# Patient Record
Sex: Female | Born: 1965 | Hispanic: No | Smoking: Never smoker
Health system: Southern US, Community
[De-identification: ages and names within clinical notes are randomized; demographics above are authoritative.]

## PROBLEM LIST (undated history)

## (undated) DIAGNOSIS — T7840XA Allergy, unspecified, initial encounter: Secondary | ICD-10-CM

## (undated) DIAGNOSIS — K222 Esophageal obstruction: Secondary | ICD-10-CM

## (undated) DIAGNOSIS — D649 Anemia, unspecified: Secondary | ICD-10-CM

## (undated) DIAGNOSIS — F329 Major depressive disorder, single episode, unspecified: Secondary | ICD-10-CM

## (undated) DIAGNOSIS — Z5189 Encounter for other specified aftercare: Secondary | ICD-10-CM

## (undated) DIAGNOSIS — F419 Anxiety disorder, unspecified: Secondary | ICD-10-CM

## (undated) DIAGNOSIS — K219 Gastro-esophageal reflux disease without esophagitis: Secondary | ICD-10-CM

## (undated) DIAGNOSIS — G43909 Migraine, unspecified, not intractable, without status migrainosus: Secondary | ICD-10-CM

## (undated) DIAGNOSIS — L309 Dermatitis, unspecified: Secondary | ICD-10-CM

## (undated) DIAGNOSIS — F32A Depression, unspecified: Secondary | ICD-10-CM

## (undated) DIAGNOSIS — J45909 Unspecified asthma, uncomplicated: Secondary | ICD-10-CM

## (undated) DIAGNOSIS — M722 Plantar fascial fibromatosis: Secondary | ICD-10-CM

## (undated) DIAGNOSIS — R42 Dizziness and giddiness: Secondary | ICD-10-CM

## (undated) DIAGNOSIS — G2581 Restless legs syndrome: Secondary | ICD-10-CM

## (undated) DIAGNOSIS — F429 Obsessive-compulsive disorder, unspecified: Secondary | ICD-10-CM

## (undated) DIAGNOSIS — M1990 Unspecified osteoarthritis, unspecified site: Secondary | ICD-10-CM

## (undated) HISTORY — DX: Allergy, unspecified, initial encounter: T78.40XA

## (undated) HISTORY — PX: NASAL SINUS SURGERY: SHX719

## (undated) HISTORY — DX: Migraine, unspecified, not intractable, without status migrainosus: G43.909

## (undated) HISTORY — DX: Encounter for other specified aftercare: Z51.89

## (undated) HISTORY — DX: Dizziness and giddiness: R42

## (undated) HISTORY — PX: TONSILLECTOMY: SUR1361

## (undated) HISTORY — DX: Dermatitis, unspecified: L30.9

## (undated) HISTORY — DX: Unspecified asthma, uncomplicated: J45.909

## (undated) HISTORY — DX: Anemia, unspecified: D64.9

## (undated) HISTORY — DX: Esophageal obstruction: K22.2

## (undated) HISTORY — PX: FOOT SURGERY: SHX648

## (undated) HISTORY — DX: Restless legs syndrome: G25.81

## (undated) HISTORY — DX: Gastro-esophageal reflux disease without esophagitis: K21.9

## (undated) HISTORY — DX: Unspecified osteoarthritis, unspecified site: M19.90

## (undated) HISTORY — DX: Anxiety disorder, unspecified: F41.9

---

## 1998-09-12 ENCOUNTER — Other Ambulatory Visit: Admission: RE | Admit: 1998-09-12 | Discharge: 1998-09-12 | Payer: Self-pay | Admitting: Obstetrics & Gynecology

## 2002-12-16 ENCOUNTER — Other Ambulatory Visit: Admission: RE | Admit: 2002-12-16 | Discharge: 2002-12-16 | Payer: Self-pay | Admitting: Obstetrics and Gynecology

## 2003-04-07 ENCOUNTER — Encounter: Payer: Self-pay | Admitting: Obstetrics and Gynecology

## 2003-04-07 ENCOUNTER — Ambulatory Visit (HOSPITAL_COMMUNITY): Admission: RE | Admit: 2003-04-07 | Discharge: 2003-04-07 | Payer: Self-pay | Admitting: Obstetrics and Gynecology

## 2003-05-31 ENCOUNTER — Inpatient Hospital Stay (HOSPITAL_COMMUNITY): Admission: AD | Admit: 2003-05-31 | Discharge: 2003-05-31 | Payer: Self-pay | Admitting: Obstetrics and Gynecology

## 2003-09-06 ENCOUNTER — Inpatient Hospital Stay (HOSPITAL_COMMUNITY): Admission: AD | Admit: 2003-09-06 | Discharge: 2003-09-11 | Payer: Self-pay | Admitting: Obstetrics and Gynecology

## 2003-09-07 ENCOUNTER — Encounter (INDEPENDENT_AMBULATORY_CARE_PROVIDER_SITE_OTHER): Payer: Self-pay

## 2003-09-08 ENCOUNTER — Encounter (INDEPENDENT_AMBULATORY_CARE_PROVIDER_SITE_OTHER): Payer: Self-pay | Admitting: Cardiology

## 2003-12-20 ENCOUNTER — Other Ambulatory Visit: Admission: RE | Admit: 2003-12-20 | Discharge: 2003-12-20 | Payer: Self-pay | Admitting: Obstetrics and Gynecology

## 2004-03-09 ENCOUNTER — Ambulatory Visit (HOSPITAL_COMMUNITY): Admission: RE | Admit: 2004-03-09 | Discharge: 2004-03-09 | Payer: Self-pay | Admitting: Family Medicine

## 2004-03-16 ENCOUNTER — Ambulatory Visit (HOSPITAL_COMMUNITY): Admission: RE | Admit: 2004-03-16 | Discharge: 2004-03-16 | Payer: Self-pay | Admitting: Family Medicine

## 2004-04-17 ENCOUNTER — Encounter: Admission: RE | Admit: 2004-04-17 | Discharge: 2004-07-16 | Payer: Self-pay | Admitting: Family Medicine

## 2004-10-03 ENCOUNTER — Ambulatory Visit: Payer: Self-pay | Admitting: Nurse Practitioner

## 2004-12-14 ENCOUNTER — Ambulatory Visit: Payer: Self-pay | Admitting: Nurse Practitioner

## 2004-12-29 ENCOUNTER — Ambulatory Visit: Payer: Self-pay | Admitting: Family Medicine

## 2005-03-08 ENCOUNTER — Other Ambulatory Visit: Admission: RE | Admit: 2005-03-08 | Discharge: 2005-03-08 | Payer: Self-pay | Admitting: Obstetrics and Gynecology

## 2005-05-24 ENCOUNTER — Ambulatory Visit: Payer: Self-pay | Admitting: Nurse Practitioner

## 2005-05-30 ENCOUNTER — Emergency Department (HOSPITAL_COMMUNITY): Admission: EM | Admit: 2005-05-30 | Discharge: 2005-05-30 | Payer: Self-pay | Admitting: Family Medicine

## 2005-08-06 ENCOUNTER — Ambulatory Visit: Payer: Self-pay | Admitting: Family Medicine

## 2005-10-04 ENCOUNTER — Ambulatory Visit: Payer: Self-pay | Admitting: Family Medicine

## 2006-02-18 ENCOUNTER — Ambulatory Visit: Payer: Self-pay | Admitting: Family Medicine

## 2006-04-30 ENCOUNTER — Other Ambulatory Visit: Admission: RE | Admit: 2006-04-30 | Discharge: 2006-04-30 | Payer: Self-pay | Admitting: Obstetrics and Gynecology

## 2006-06-29 ENCOUNTER — Emergency Department (HOSPITAL_COMMUNITY): Admission: EM | Admit: 2006-06-29 | Discharge: 2006-06-29 | Payer: Self-pay | Admitting: Family Medicine

## 2006-07-11 ENCOUNTER — Ambulatory Visit (HOSPITAL_COMMUNITY): Admission: RE | Admit: 2006-07-11 | Discharge: 2006-07-11 | Payer: Self-pay | Admitting: Family Medicine

## 2006-08-08 ENCOUNTER — Ambulatory Visit (HOSPITAL_BASED_OUTPATIENT_CLINIC_OR_DEPARTMENT_OTHER): Admission: RE | Admit: 2006-08-08 | Discharge: 2006-08-08 | Payer: Self-pay | Admitting: Otolaryngology

## 2006-08-08 ENCOUNTER — Encounter (INDEPENDENT_AMBULATORY_CARE_PROVIDER_SITE_OTHER): Payer: Self-pay | Admitting: *Deleted

## 2006-10-14 ENCOUNTER — Ambulatory Visit: Payer: Self-pay | Admitting: Family Medicine

## 2007-06-02 ENCOUNTER — Encounter: Admission: RE | Admit: 2007-06-02 | Discharge: 2007-06-02 | Payer: Self-pay | Admitting: Obstetrics and Gynecology

## 2007-06-04 ENCOUNTER — Ambulatory Visit: Payer: Self-pay | Admitting: Family Medicine

## 2007-09-15 ENCOUNTER — Ambulatory Visit: Payer: Self-pay | Admitting: Family Medicine

## 2007-09-20 ENCOUNTER — Emergency Department (HOSPITAL_COMMUNITY): Admission: EM | Admit: 2007-09-20 | Discharge: 2007-09-20 | Payer: Self-pay | Admitting: Emergency Medicine

## 2007-11-12 ENCOUNTER — Emergency Department (HOSPITAL_COMMUNITY): Admission: EM | Admit: 2007-11-12 | Discharge: 2007-11-12 | Payer: Self-pay | Admitting: Family Medicine

## 2008-02-14 ENCOUNTER — Emergency Department (HOSPITAL_COMMUNITY): Admission: EM | Admit: 2008-02-14 | Discharge: 2008-02-14 | Payer: Self-pay | Admitting: Family Medicine

## 2008-02-23 ENCOUNTER — Ambulatory Visit: Payer: Self-pay | Admitting: Internal Medicine

## 2008-04-15 ENCOUNTER — Ambulatory Visit: Payer: Self-pay | Admitting: Internal Medicine

## 2008-04-17 ENCOUNTER — Ambulatory Visit (HOSPITAL_COMMUNITY): Admission: RE | Admit: 2008-04-17 | Discharge: 2008-04-17 | Payer: Self-pay | Admitting: Family Medicine

## 2009-04-04 ENCOUNTER — Encounter (INDEPENDENT_AMBULATORY_CARE_PROVIDER_SITE_OTHER): Payer: Self-pay | Admitting: Family Medicine

## 2009-04-04 ENCOUNTER — Ambulatory Visit: Payer: Self-pay | Admitting: Family Medicine

## 2009-04-04 LAB — CONVERTED CEMR LAB: Helicobacter Pylori Antibody-IgG: 0.9

## 2009-10-03 ENCOUNTER — Ambulatory Visit: Payer: Self-pay | Admitting: Family Medicine

## 2010-07-09 ENCOUNTER — Emergency Department (HOSPITAL_COMMUNITY): Admission: EM | Admit: 2010-07-09 | Discharge: 2010-07-09 | Payer: Self-pay | Admitting: Family Medicine

## 2010-07-09 ENCOUNTER — Emergency Department (HOSPITAL_COMMUNITY): Admission: EM | Admit: 2010-07-09 | Discharge: 2010-07-09 | Payer: Self-pay | Admitting: Emergency Medicine

## 2010-07-21 ENCOUNTER — Ambulatory Visit: Payer: Self-pay | Admitting: Family Medicine

## 2010-07-21 LAB — CONVERTED CEMR LAB
CO2: 26 meq/L (ref 19–32)
Calcium: 9.1 mg/dL (ref 8.4–10.5)
Potassium: 4.9 meq/L (ref 3.5–5.3)
Sodium: 139 meq/L (ref 135–145)
TSH: 1.538 microintl units/mL (ref 0.350–4.500)
Vit D, 25-Hydroxy: 38 ng/mL (ref 30–89)

## 2010-08-04 ENCOUNTER — Ambulatory Visit (HOSPITAL_BASED_OUTPATIENT_CLINIC_OR_DEPARTMENT_OTHER): Admission: RE | Admit: 2010-08-04 | Discharge: 2010-08-04 | Payer: Self-pay | Admitting: Family Medicine

## 2010-08-05 ENCOUNTER — Ambulatory Visit: Payer: Self-pay | Admitting: Internal Medicine

## 2010-12-31 ENCOUNTER — Encounter: Payer: Self-pay | Admitting: Family Medicine

## 2011-02-21 ENCOUNTER — Encounter (INDEPENDENT_AMBULATORY_CARE_PROVIDER_SITE_OTHER): Payer: Self-pay | Admitting: Family Medicine

## 2011-02-21 LAB — CONVERTED CEMR LAB
ALT: 17 units/L (ref 0–35)
Basophils Absolute: 0 10*3/uL (ref 0.0–0.1)
CO2: 21 meq/L (ref 19–32)
Calcium: 9.6 mg/dL (ref 8.4–10.5)
Chloride: 107 meq/L (ref 96–112)
Creatinine, Ser: 0.85 mg/dL (ref 0.40–1.20)
FSH: 5.8 milliintl units/mL
Hemoglobin: 13.2 g/dL (ref 12.0–15.0)
Lymphocytes Relative: 38 % (ref 12–46)
Monocytes Absolute: 0.6 10*3/uL (ref 0.1–1.0)
Neutro Abs: 3.9 10*3/uL (ref 1.7–7.7)
RBC: 4.54 M/uL (ref 3.87–5.11)
RDW: 14.5 % (ref 11.5–15.5)
Total Protein: 6.8 g/dL (ref 6.0–8.3)
WBC: 8 10*3/uL (ref 4.0–10.5)

## 2011-02-24 LAB — POCT I-STAT, CHEM 8
Calcium, Ion: 1.02 mmol/L — ABNORMAL LOW (ref 1.12–1.32)
Chloride: 108 mEq/L (ref 96–112)
HCT: 40 % (ref 36.0–46.0)
Potassium: 3.8 mEq/L (ref 3.5–5.1)
Sodium: 138 mEq/L (ref 135–145)

## 2011-02-24 LAB — URINALYSIS, ROUTINE W REFLEX MICROSCOPIC
Ketones, ur: NEGATIVE mg/dL
Nitrite: NEGATIVE
Protein, ur: NEGATIVE mg/dL
pH: 5.5 (ref 5.0–8.0)

## 2011-02-24 LAB — CBC
MCHC: 34.3 g/dL (ref 30.0–36.0)
Platelets: 258 10*3/uL (ref 150–400)
RDW: 14.3 % (ref 11.5–15.5)
WBC: 10.3 10*3/uL (ref 4.0–10.5)

## 2011-02-24 LAB — POCT PREGNANCY, URINE: Preg Test, Ur: NEGATIVE

## 2011-02-24 LAB — DIFFERENTIAL
Basophils Absolute: 0 10*3/uL (ref 0.0–0.1)
Basophils Relative: 1 % (ref 0–1)
Neutro Abs: 6.3 10*3/uL (ref 1.7–7.7)
Neutrophils Relative %: 61 % (ref 43–77)

## 2011-04-27 NOTE — Discharge Summary (Signed)
NAME:  Stephanie Serrano, Stephanie Serrano              ACCOUNT NO.:  [id number]   MEDICAL RECORD NO.:  [id number]                   PATIENT TYPE:  INP   LOCATION:  9326                                 FACILITY:  [facility]   PHYSICIAN:  Naima A. Dillard, M.D.              DATE OF BIRTH:  1966-03-31   DATE OF ADMISSION:  09/06/2003  DATE OF DISCHARGE:  09/11/2003                                 DISCHARGE SUMMARY   ADMISSION DIAGNOSES:  1. Intrauterine pregnancy at term.  2. Status post fall.  3. Induction of labor.   DISCHARGE DIAGNOSES:  1. Intrauterine pregnancy at term.  2. Status post fall.  3. Induction of labor.  4. Inverted uterus.  5. Postpartum hemorrhage.  6. Macrosomia with shoulder dystocia.   PROCEDURE:  1. Spontaneous vaginal birth.  2. Repair of second degree perineal laceration.  3. Replacement of inverted uterus.   HOSPITAL COURSE:  Stephanie Serrano is a 45 year old gravida 4, para 1-0-2-  1 at 41-0/7 weeks who presented status post fall on September 06, 2003. She  had landed on her left hip and her right knee. She denied leaking or  bleeding and reported uterine contractions began after the fall. Her  pregnancy had been followed by the [facility] and  Gynecologic Certified Nurse Midwife Services and had been remarkable for 1.  AMA. 2. Inguinal hernia with her last pregnancy and no repair. 3. History of  asthma. 4. Social stress. 5. Group B strep negative. Upon admission, the  patient's cervix was 1 to 2 cm dilated, 90%, vertex, and minus 1. She was  having uterine contractions every 1 to 4 minutes. Her membranes were  ruptured that evening at 8:30 for thin meconium stained fluid. At that  point, she was 2 cm and 90% effaced. An hour later, she requested an  epidural. She continued to progress in a normal pattern throughout her  labor. Fetal heart rate at times had decreased variability with occasional  early variables. By 5:40 a.m. on September 07, 2003, the patient was  completely dilated with vertex at the +1 station and she began pushing at  that time. She pushed for approximately an hour and 20 minutes and was not  feeling any pressure or urge, so she rested for a couple of hours before  resuming pushing. Infant was delivered at 9:43 a.m. on September 08, 2003.  It was a viable female. Weight of 9 pounds and 10 ounces. Apgar's of 7 and  9. There was a shoulder dystocia with that delivery, requiring McRoberts and  suprapubic pressure as well as rotation of the shoulders. Infant had  decreased movement of the left shoulder but active movement of the left hand  and fingers.  Trailing membranes were noted and were teased gently. Placenta  delivered at 9:55 a.m. Uterus was noted to be boggy after the delivery with  firming noted after bi-manual compression. Bleeding continued to be noted at  intervals with small gushes. The  patient was given Methergine as well as  Cytotec 1,000 mg per rectum. By approximately 10:10 a.m., the patient's  blood pressure began to drop to 80/32. Her pulse was 110. She was given  Efedron and Dr. Su Hilt was paged to evaluate. She noted an inverted uterus.  The patient was taken to the operating room and underwent replacement of her  uterus. The patient had cardiac enzymes drawn after her uterus was  successfully replaced. The patient's blood loss was estimated to be between  1600 cc and 2000 cc during the birth and her time in the operating room.  After the procedure, she was taken to the Adult Intensive Care Unit where  she remained stable. Cardiac enzymes were elevated. By postpartum day 1, her  hemoglobin was 8.5, hematocrit 24.2. Cardiologist, Dr. Waldon Reining, was  consulted and patient was seen by the cardiologist on postpartum day 1 where  it was noted that most likely, her abnormal cardiac enzymes came from the  hypotensive periods that the patient had post delivery. The patient did have  a 2-D  echocardiogram that was performed and was normal. By postpartum day 2,  the patient continued to do well, although tired. Her hemoglobin was 6.8. At  that time, it was discussed with the patient to have a blood transfusion,  which she consented to receive and 2 units of packed red blood cells were  given. By that evening, she began feeling weak again and agreed to receiving  another 2 units of packed red blood cells. By postpartum day 3, she was  tired and complained of occasional headache. Breast feeding was going well.  Her hemoglobin was 9.5. She was recovering well from the delivery and by  postpartum day 4, hemoglobin was 8.9. She continued to feel slightly better.  Social work had been in to consult with the patient regarding a history of  postpartum depression with her first child. Although she reported feeling  teary at times, she did not feel the need to start antidepressant  medications at this point but was agreeable to receiving prescription for  Zoloft, in the event that she would need to start such medication.   DISPOSITION:  She was discharged from our service on postpartum day 4,  September 11, 2003. Her baby continued to remain at [facility] for an  additional day, as the patient and Stephanie Serrano will room in with her  child.   DISCHARGE INSTRUCTIONS:  Per [facility] and Gynecologic  Services handout.   DISCHARGE MEDICATIONS:  1. Motrin 600 mg 1 p.o. q. 6 hours p.r.n. pain.  2. Tylox 1-2 p.o. q. 3 to 4 hours p.r.n. pain.  3. Prenatal vitamin 1 p.o. daily.  4. Zoloft 25 mg 1 p.o. daily to start when needed, if signs of postpartum     depression arise.   FOLLOW UP:  Will occur at [facility] and Gynecologic  Services in 1 week.     Cam Hai, C.N.M.                     Naima A. Normand Sloop, M.D.    KS/MEDQ  D:  09/11/2003  T:  09/12/2003  Job:  [id number]
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
## 2011-04-27 NOTE — Op Note (Signed)
Stephanie Serrano              ACCOUNT NO.:  [id number]   MEDICAL RECORD NO.:  [id number]          PATIENT TYPE:  AMB   LOCATION:  DSC                          FACILITY:  [facility]   PHYSICIAN:  Suzanna Obey, M.D.       DATE OF BIRTH:  Nov 26, 1966   DATE OF PROCEDURE:  08/08/2006  DATE OF DISCHARGE:                                 OPERATIVE REPORT   SURGEON:  Suzanna Obey, M.D.   PREOPERATIVE DIAGNOSES:  Chronic sinusitis, deviated septum, turbinate  hypertrophy.   POSTOPERATIVE DIAGNOSES:  Chronic sinusitis, deviated septum, turbinate  hypertrophy.   PROCEDURES:  Septoplasty, submucous resection of inferior turbinates, middle  turbinate resection, bilateral maxillary antrostomy, and anterior  ethmoidectomy.   ANESTHESIA:  General.   ESTIMATED BLOOD LOSS:  20 cc.   INDICATIONS:  This is a 45 year old, who has had significant nasal  obstruction that has been refractory to medical therapy.  She has a CT scan  that showed bilateral concha bullosa with thickening around both osteomeatal  complexes.  A severe deviated septum to the left.  Bilateral turbinate  hypertrophy.  She was informed of the risks and benefits of the procedure,  including bleeding, infection, scarring, CSF leak, change in the sense of  smell, blindness, chronic crusting and drying, change in the external  appearance of the nose, numbness of the teeth, and risks of the anesthetic.  She also might have a possibility of a septal perforation.  All of her  questions were answered and consent was obtained.   OPERATION:  The patient was taken to the operating room and placed in supine  position.  After adequate general endotracheal tube anesthesia, was prepped  and draped in the usual sterile manner.  Oxymetazoline pledgets were placed  into the nose bilaterally, and the septum, inferior turbinate, and middle  turbinates were injected with 1% lidocaine with 1:100,000 epinephrine.  A  left hemitransfixion incision was  performed, raising mucoperichondrial and -  ostial flaps.  The cartilage was divided about 1 cm posterior to the caudal  strut, and this cartilage was removed with a Therapist, nutritional.  The opposite  flap was elevated and the bony portion of the septum was removed with a  Jansen-Middleton forceps.  This was removed superiorly, and then the  inferior very large spur was removed with a 4-mm osteotome.  This actually  corrected the septal deflection.  The turbinates were infractured.  A  midline incision was made with a 15 blade.  The mucosal flap was elevated  superiorly, and the inferior mucosa and bone were removed with a turbinate  scissors.  The edge was cauterized with suction cautery, and both turbinates  were laid back down over the raw surface and outfractured.  The middle  turbinate and the sinus was then addressed with the endoscope on the left  side, where the uncinectomy was performed.  Backbiting, we opened in the  maxillary antrostomy widely, and then the ethmoid anterior portion of the  bulla was opened with the microdebrider.  And the antrostomy was opened  nicely with the microdebrider.  There  was a slight amount of thickened  tissue in this zone.  The lateral aspect of the middle turbinate was trimmed  with the microdebrider.  The contour and shape of the middle turbinate were  not resected.  The right side was repeated in a similar fashion with the  concha bullosa and middle turbinate, which were much larger on this side,  and the lateral aspect was removed with the microdebrider, leaving the  medial aspect intact.  This opened up the concha bullosa.  The uncinectomy  was then performed.  I opened up the maxillary antrostomy with the  microdebrider, and then the bulla and anterior ethmoid were opened with the  microdebrider.  Slight thickening of the tissue.  There was good hemostasis  after completion.  The hemitransfixion incision was closed with interrupted  4-0 chromic,  and a 4-0 plain gut suture placed through the septum.  Telfa  rolls soaked in Bacitracin were placed into the nose bilaterally and secured  with a 3-0 nylon.  The nasopharynx was suctioned out of all blood and debris  before placement of these packs.  The patient was awakened and brought to  recovery in stable condition.  Counts correct.           ______________________________  Suzanna Obey, M.D.     JB/MEDQ  D:  08/08/2006  T:  08/08/2006  Job:  [id number]

## 2011-04-27 NOTE — Consult Note (Signed)
NAME:  Stephanie Serrano, Stephanie Serrano              ACCOUNT NO.:  [id number]   MEDICAL RECORD NO.:  [id number]                   PATIENT TYPE:  INP   LOCATION:  9374                                 [facility]   PHYSICIAN:  Armanda Magic, M.D.                  DATE OF BIRTH:  12-10-1966   DATE OF CONSULTATION:  09/08/2003  DATE OF DISCHARGE:                                   CONSULTATION   CHIEF COMPLAINT:  Chest pain and abnormal cardiac enzymes.   HISTORY OF PRESENT ILLNESS:  This is a 45 year old female who presented with  a 41-week pregnancy and presented to the emergency room after a fall where  she apparently landed on her left hip and right knee.  She said she has  tripped and flew through the air.  She then was admitted to the hospital,  and subsequently was induced, since she was full term.   Vaginal delivery was complicated by uterine inversion.  The patient had  significant vaginal hemorrhage, and was taken to the operating room for  further evaluation.  She was noted to have systolic blood pressure in the  60's in the operating room, and was given IV fluid resuscitation with  improvement in her systolic blood pressure to 100 mmHg.  After successful  replacement of the uterus in the proper position, the patient complained of  substernal chest pain, as well as feeling that her tongue became thick and  painful.  Cardiac enzymes were subsequently ordered.  Cardiac enzymes were  noted to be elevated with a CPK of 615, MB 15.4.  The relative index was  normal at 2.5.  Troponin was elevated at 0.07.  Currently, she is pain free.  After discussion with the patient, she, throughout her pregnancy, has had  episodic problems with pleuritic chest pain.  She describes the pain as a  sharp pain in the middle of her chest with no radiation, worsened with deep  breathing, can occur at any time, and is not necessarily exertional.  She  says she attributed it to problems she has had with  asthma, because she said  with her asthma she would sometimes get some chest tightness.  She has had  some dyspnea on exertion while being pregnant, but prior to being pregnant  she was asymptomatic from a cardiac standpoint.   PAST MEDICAL HISTORY:  1. Significant for asthma.  2. Questionable heart murmur as a child.  3. Iron deficiency anemia.  4. STD's with HSV.  5. Migraines.  6. Inguinal hernia after her last pregnancy.   FAMILY HISTORY:  Her father died of pancreatitis cancer and duodenal cancer  with liver metastases.  Her mother has some kind of questionable heart  disease.  She has a grandmother with hypertension and heart disease, as  well.   SOCIAL HISTORY:  She is divorced.  She denies tobacco or alcohol use.  No IV  drug use.   ALLERGIES:  1. PENICILLIN.  2. SULFA.   MEDICATIONS:  Prenatal vitamins.   REVIEW OF SYSTEMS:  Otherwise negative.   PHYSICAL EXAMINATION:  GENERAL:  This is a well-developed, well-nourished  black female in no acute distress.  VITAL SIGNS:  Her blood pressure is 115/45, and her heart rate is 90-100.  HEENT:  Benign.  NECK:  Supple without lymphadenopathy.  No carotid bruits.  LUNGS:  Clear to auscultation throughout.  HEART:  Regular rate and rhythm.  No murmurs, rubs, or gallops.  Normal S1  and S2.  ABDOMEN:  Soft, nondistended, with active bowel sounds.  EXTREMITIES:  No cyanosis, erythema, or edema.   LABORATORY DATA:  CPK's are 389, 410, and 615.  MB is 9.5, 10.7, and 15.4.  Troponin is 0.07, 0.05, and 0.07.  Relative index 2.4, 2.6, and 2.5.  Sodium  133, potassium 3.9, chloride 107, bicarb 25, BUN 6, creatinine 0.7, glucose  70.  LFTs within normal limits.  White cell count 19.2, hemoglobin 8.5,  hematocrit 24.2, platelet count 115.  EKG shows sinus tachycardia with no ST  changes.   ASSESSMENT:  1. Chest pain with abnormal cardiac enzymes.  CPK and MB are elevated, but     the relative index is normal.  Therefore, not  consistent with cardiac     origin.  Most likely, her CPK bump is secondary to muscle release from     her fall, which she sustained prior to admission.  The elevated troponin,     which is only slightly elevated, most likely is due to profound     hypotension during her complicated vaginal delivery.  EKG shows no     evidence of ischemic changes, although reportedly she did have ST segment     elevation intraoperatively.  She does have a history of intermittent     chest pain, although very atypical for underlying coronary disease.  It     was described as pleuritic sharp pain, nonexertional, throughout her     pregnancy.  2. History of asthma.   PLAN:  1. 2-D echocardiogram to evaluate for structural heart disease.  2. Once she has recuperated from this hospitalization, will order an     outpatient stress test.                                               Armanda Magic, M.D.    TT/MEDQ  D:  09/08/2003  T:  09/08/2003  Job:  [id number]   cc:   Naima A. Normand Sloop, M.D.  [address]  [place]  [place] [address]  Fax: [phone]

## 2011-04-27 NOTE — Op Note (Signed)
NAME:  DEEM, MARMOL              ACCOUNT NO.:  [id number]   MEDICAL RECORD NO.:  [id number]                   PATIENT TYPE:  INP   LOCATION:  9374                                 [facility]   PHYSICIAN:  Osborn Coho, M.D.                DATE OF BIRTH:  1966/03/04   DATE OF PROCEDURE:  DATE OF DISCHARGE:                                 OPERATIVE REPORT   DELIVERY NOTE   DATE OF DELIVERY:  September 07, 2003   DIAGNOSES:  1. Intrauterine pregnancy at 41 weeks.  2. Moderate shoulder dystocia.  3. Uterine inversion.  4. Postpartum hemorrhage.  5. Questionable left fractured clavicle and Erb's palsy on infant.   ATTENDANTS:  1. Nigel Bridgeman, C.N.M.  2. Osborn Coho, M.D.   PROCEDURES:  1. Normal spontaneous vaginal birth.  2. Reversion of uterine inversion by Dr. Osborn Coho.  3. Repair of second degree laceration by Nigel Bridgeman, C.N.M.   ANALGESIA AND ANESTHESIA:  Epidural for labor and early postpartum period.   COMPLICATIONS:  1. Moderate shoulder dystocia.  2. Postpartum hemorrhage.  3. Uterine inversion.   FINDINGS:  The patient was completely dilated at 5:40 a.m.  At that time  pushing was initiated.  However, by 7 a.m. the vertex had only descended to  approximately a +2 station and the patient had minimal to no urge to push.  The decision was made to defer pushing at that time and to allow the patient  to rest for a time, and to reinitiate pushing when the urge occurred.  The  patient rested comfortably with the epidural, although it had been turned  off at approximately 6:45 a.m.  Uterine contractions began to space out and  Pitocin was begun per low-dose augmentation to facilitate further progress.  Fetal heart rate remained reassuring although there were some episodes of  baseline in the low 160s and mild to moderate variables.  The patient began  pushing again at approximately 9:24 a.m. and delivered spontaneously at 9:43  a.m.   Moderate shoulder dystocia was noted at that time.  It was relieved  with McRoberts maneuver, suprapubic pressure, and rotation of the shoulders.  Infant was DeLee suctioned on the perineum for light meconium-stained fluid,  although the fluid that was returned was clear.  The infant was handed to  the waiting pediatric team.  The infant was noted to have decreased movement  of the left shoulder but did have movement of the left arm and hand.  During  the course of the patient's pushing two or three minutes prior to delivery  she did note that my last baby got stuck underneath my pubic bone.   After delivery of the infant the placenta was delivered spontaneously and  intact at approximately 9:55 a.m.  There were a small amount of membranes  noted to be trailing from the cervical os.  These were teased out gently  with a sponge forceps.  A second degree perineal  laceration was noted.  After delivery of the placenta the patient was noted to have a moderate  amount of bright red bleeding.  This did improve with bimanual compression  of the uterus.  However, sporadically the patient would have a small gush of  bright red bleeding and pass a few small clots.  This would then resolve  with bimanual compression and measures including Pitocin, Methergine 0.2 mg  IM, and gentle fundal massage.  However, as the bleeding continued by  approximately 10:12 a.m., 1000 mcg of Cytotec was placed intrarectally and  Dr. Su Hilt was asked to come to consult regarding continued vaginal  bleeding.  By that time there was noted to be a firm mass in the deep  posterior portion of the vagina that was very firm.  Dr. Su Hilt arrived and  identified a probable uterine inversion.  She was unable to reduce this by  manual means over the next five minutes in the labor room.  Therefore, the  patient was taken to the operating room for further care.  Please see Dr.  Su Hilt' notes for further description of the measures  undertaken at that  time.   Ultimately, the uterus was reverted to normal position by Dr. Su Hilt  manually.  The second degree perineal laceration was repaired by Nigel Bridgeman in the OR under the existing epidural anesthesia and the patient was  taken to the post anesthesia care unit with extensive orders for further  care.  Again, please see Dr. Su Hilt' notes for details on the patient's  postoperative course.   The infant was a viable female, weight 9 pounds 10 ounces, Apgars were 7 and  9.  The infant's name was [facility].  She was taken to the fullterm nursery  in good condition.     Renaldo Reel Emilee Hero, C.N.M.                   Osborn Coho, M.D.    VLL/MEDQ  D:  09/07/2003  T:  09/07/2003  Job:  [id number]

## 2011-09-03 LAB — POCT RAPID STREP A: Streptococcus, Group A Screen (Direct): NEGATIVE

## 2013-02-19 ENCOUNTER — Emergency Department (INDEPENDENT_AMBULATORY_CARE_PROVIDER_SITE_OTHER)
Admission: EM | Admit: 2013-02-19 | Discharge: 2013-02-19 | Disposition: A | Discharge status: Home / Self Care | Payer: Self-pay | Source: Home / Self Care | Attending: Emergency Medicine | Admitting: Emergency Medicine

## 2013-02-19 ENCOUNTER — Encounter (HOSPITAL_COMMUNITY): Payer: Self-pay | Admitting: *Deleted

## 2013-02-19 DIAGNOSIS — S335XXA Sprain of ligaments of lumbar spine, initial encounter: Secondary | ICD-10-CM

## 2013-02-19 DIAGNOSIS — S39012A Strain of muscle, fascia and tendon of lower back, initial encounter: Secondary | ICD-10-CM

## 2013-02-19 LAB — POCT URINALYSIS DIP (DEVICE)
Glucose, UA: NEGATIVE mg/dL
Hgb urine dipstick: NEGATIVE
Nitrite: NEGATIVE
Protein, ur: NEGATIVE mg/dL
Urobilinogen, UA: 0.2 mg/dL (ref 0.0–1.0)
pH: 7 (ref 5.0–8.0)

## 2013-02-19 LAB — POCT PREGNANCY, URINE: Preg Test, Ur: NEGATIVE

## 2013-02-19 MED ORDER — HYDROCODONE-ACETAMINOPHEN 5-325 MG PO TABS: ORAL_TABLET | ORAL | Status: DC

## 2013-02-19 MED ORDER — METHOCARBAMOL 500 MG PO TABS: 500.0000 mg | ORAL_TABLET | Freq: Three times a day (TID) | ORAL | Status: DC

## 2013-02-19 MED ORDER — ETODOLAC 400 MG PO TABS: 400.0000 mg | ORAL_TABLET | Freq: Two times a day (BID) | ORAL | Status: DC

## 2013-02-19 NOTE — ED Notes (Signed)
Pt  Reports   Symptoms of     Lower  Back  pain   Mainly On l  Side          Symptoms  X  2  Days     Pt  denys           Any  specefic  Injury  Pt  Reports  Frequency  Of  Urination as  Well             denys  Any  Vaginal bleeding or  Discharge      Walks  Steady  With  A  Fluid    Steady  Gait

## 2013-02-19 NOTE — ED Provider Notes (Signed)
Chief Complaint:   Chief Complaint  Patient presents with  . Back Pain    History of Present Illness:   Stephanie Serrano is a 47 year old female who presents with a two-day history of left lower back pain. The patient states that this occurred about 3 weeks ago, went away that, then came back 2 days ago. She denies any injury to the back. The pain is nonradiating. It's severe and rated 10 over 10 in intensity. It's worse with movement and better with Tylenol and etodolac. She's had some urinary frequency, urgency, hesitancy. She denies any hematuria or dysuria. She's had no fever, chills, or weight loss. She denies any numbness or tingling in the legs or muscle weakness. There's been no urinary retention, urinary incontinence, incontinence of stool, saddle anesthesia, or abdominal pain.  Review of Systems:  Other than noted above, the patient denies any of the following symptoms: Systemic:  No fever, chills, severe fatigue, or unexplained weight loss. GI:  No abdominal pain, nausea, vomiting, diarrhea, constipation, incontinence of bowel, or blood in stool. GU:  No dysuria, frequency, urgency, or hematuria. No incontinence of urine or difficulty urinating.  M-S:  No neck pain, joint pain, arthritis, or myalgias. Neuro:  No paresthesias, saddle anesthesia, muscular weakness, or progressive neurological deficit.  PMFSH:  Past medical history, family history, social history, meds, and allergies were reviewed. Specifically, there is no history of cancer, major trauma, osteoporosis, immunosuppression, HIV, or IV or injection drug use. Patient is allergic to clindamycin, sulfa, and penicillin. She takes etodolac for the back pain and has an albuterol inhaler. Her diagnoses include asthma and vertigo. She does not use alcohol or tobacco.  Physical Exam:   Vital signs:  BP 120/76  Pulse 72  Temp(Src) 98.6 F (37 C) (Oral)  Resp 16  SpO2 100%  LMP 01/28/2013 General:  Alert, oriented, in no  distress. Abdomen:  Soft, non-tender.  No organomegaly or mass.  No pulsatile midline abdominal mass or bruit. Back:  She has tenderness to palpation just above the left iliac crest. Her back has a full range of motion with moderate pain. Straight leg raising is negative. Neuro:  Normal muscle strength, sensations and DTRs. Extremities: Pedal pulses were full, there was no edema. Skin:  Clear, warm and dry.  No rash.  Labs:   Results for orders placed during the hospital encounter of 02/19/13  POCT URINALYSIS DIP (DEVICE)      Result Value Range   Glucose, UA NEGATIVE  NEGATIVE mg/dL   Bilirubin Urine LARGE (*) NEGATIVE   Ketones, ur NEGATIVE  NEGATIVE mg/dL   Specific Gravity, Urine 1.020  1.005 - 1.030   Hgb urine dipstick NEGATIVE  NEGATIVE   pH 7.0  5.0 - 8.0   Protein, ur NEGATIVE  NEGATIVE mg/dL   Urobilinogen, UA 0.2  0.0 - 1.0 mg/dL   Nitrite NEGATIVE  NEGATIVE   Leukocytes, UA NEGATIVE  NEGATIVE  POCT PREGNANCY, URINE      Result Value Range   Preg Test, Ur NEGATIVE  NEGATIVE    Assessment:  The encounter diagnosis was Lumbar strain, initial encounter.  Since her urinalysis is normal, urinary causes for the back pain are unlikely. I think this is musculoskeletal pain, possibly muscle strain, arthritis, herniated disc, or bulging disc. I have suggested that she see Dr. Durene Romans if no better in 2 weeks.  Plan:   1.  The following meds were prescribed:   Discharge Medication List as of 02/19/2013  2:18  PM    START taking these medications   Details  etodolac (LODINE) 400 MG tablet Take 1 tablet (400 mg total) by mouth 2 (two) times daily., Starting 02/19/2013, Until Discontinued, Normal    HYDROcodone-acetaminophen (NORCO/VICODIN) 5-325 MG per tablet 1 to 2 tabs every 4 to 6 hours as needed for pain., Print    methocarbamol (ROBAXIN) 500 MG tablet Take 1 tablet (500 mg total) by mouth 3 (three) times daily., Starting 02/19/2013, Until Discontinued, Normal       2.   The patient was instructed in symptomatic care and handouts were given. 3.  The patient was told to return if becoming worse in any way, if no better in 2 weeks, and given some red flag symptoms including worsening symptoms including worse pain, fever, or neurological changes that would indicate earlier return. 4.  The patient was encouraged to try to be as active as possible and given some exercises to do followed by moist heat.    Reuben Likes, MD 02/19/13 [phone]

## 2013-02-21 LAB — URINE CULTURE

## 2013-02-23 ENCOUNTER — Telehealth (HOSPITAL_COMMUNITY): Payer: Self-pay | Admitting: *Deleted

## 2013-02-23 NOTE — ED Notes (Signed)
Pt. called for her urine culture report on VM. I called pt. back.  Pt. verified x 2 and given results. ( 6,000 colonies - Insignificant growth).  Pt. still having symptoms of urgency and feeling like she still has to urinate right after she goes.  She said she had taken some Zithromax prior to coming here to be checked and asked if that could have affected the culture. I told her yes. I suggested she come back and get her urine rechecked, since she is still having symptoms. Pt. has no insurance and said she is paying out of pocket, but would try to come back. Vassie Moselle 02/23/2013

## 2014-02-10 ENCOUNTER — Encounter: Payer: Self-pay | Admitting: Gastroenterology

## 2014-03-19 ENCOUNTER — Encounter: Payer: Self-pay | Admitting: *Deleted

## 2014-03-22 ENCOUNTER — Encounter: Payer: Self-pay | Admitting: Gastroenterology

## 2014-03-22 ENCOUNTER — Ambulatory Visit (INDEPENDENT_AMBULATORY_CARE_PROVIDER_SITE_OTHER): Payer: Medicaid Other | Admitting: Gastroenterology

## 2014-03-22 VITALS — BP 130/82 | HR 80 | Ht 62.0 in | Wt 200.4 lb

## 2014-03-22 DIAGNOSIS — R1319 Other dysphagia: Secondary | ICD-10-CM

## 2014-03-22 DIAGNOSIS — R1310 Dysphagia, unspecified: Secondary | ICD-10-CM | POA: Insufficient documentation

## 2014-03-22 NOTE — Assessment & Plan Note (Signed)
Two-year history of intermittent dysphagia with food impaction  probably secondary to a fixed stricture, be it a peptic stricture or Schatzki's ring.    Recommendations #1 upper endoscopy with dilatation as indicated  Risks, alternatives, and complications of the procedure, including bleeding, perforation, and possible need for surgery, were explained to the patient.  Patient's questions were answered.

## 2014-03-22 NOTE — Progress Notes (Signed)
    _                                                                                                                History of Present Illness: Pleasant 48 year old Afro-American female referred for evaluation of dysphagia.  For the past 2 years she has been complaining of intermittent dysphagia to solids characterized by food lodging in her esophagus forcing her to throw it back up.  When this occurs she has chest discomfort.  She denies pyrosis.  She has mild dysphagia to liquids on occasion.  Weight has been stable.    Past Medical History  Diagnosis Date  . RLS (restless legs syndrome)   . Anxiety   . Asthma    Past Surgical History  Procedure Laterality Date  . Foot surgery    . Nasal sinus surgery     family history includes Diabetes in her paternal grandmother; Pancreatic cancer in her father. Current Outpatient Prescriptions  Medication Sig Dispense Refill  . sertraline (ZOLOFT) 50 MG tablet Take 50 mg by mouth daily.       No current facility-administered medications for this visit.   Allergies as of 03/22/2014 - Review Complete 03/22/2014  Allergen Reaction Noted  . Clindamycin/lincomycin  03/22/2014  . Penicillins  03/22/2014  . Sulfa antibiotics  03/22/2014    reports that she has never smoked. She has never used smokeless tobacco. She reports that she does not drink alcohol or use illicit drugs.     Review of Systems: Since giving birth she occasionally has a sticking pain in the right upper quadrant subcostally she bends in certain directions   Pertinent positive and negative review of systems were noted in the above HPI section. All other review of systems were otherwise negative.  Vital signs were reviewed in today's medical record Physical Exam: General: Well developed , well nourished, no acute distress Skin: anicteric Head: Normocephalic and atraumatic Eyes:  sclerae anicteric, EOMI Ears: Normal auditory acuity Mouth: No deformity or  lesions Neck: Supple, no masses or thyromegaly Lungs: Clear throughout to auscultation Heart: Regular rate and rhythm; no murmurs, rubs or bruits Abdomen: Soft, non tender and non distended. No masses, hepatosplenomegaly or hernias noted. Normal Bowel sounds Rectal:deferred Musculoskeletal: Symmetrical with no gross deformities  Skin: No lesions on visible extremities Pulses:  Normal pulses noted Extremities: No clubbing, cyanosis, edema or deformities noted Neurological: Alert oriented x 4, grossly nonfocal Cervical Nodes:  No significant cervical adenopathy Inguinal Nodes: No significant inguinal adenopathy Psychological:  Alert and cooperative. Normal mood and affect  See Assessment and Plan under Problem List

## 2014-03-22 NOTE — Patient Instructions (Signed)

## 2014-03-23 ENCOUNTER — Ambulatory Visit (AMBULATORY_SURGERY_CENTER): Payer: Medicaid Other | Admitting: Gastroenterology

## 2014-03-23 ENCOUNTER — Encounter: Payer: Self-pay | Admitting: Gastroenterology

## 2014-03-23 VITALS — BP 133/89 | HR 83 | Temp 99.3°F | Resp 20 | Ht 62.0 in | Wt 200.0 lb

## 2014-03-23 DIAGNOSIS — K209 Esophagitis, unspecified without bleeding: Secondary | ICD-10-CM

## 2014-03-23 DIAGNOSIS — K222 Esophageal obstruction: Secondary | ICD-10-CM

## 2014-03-23 DIAGNOSIS — R1310 Dysphagia, unspecified: Secondary | ICD-10-CM

## 2014-03-23 MED ORDER — SODIUM CHLORIDE 0.9 % IV SOLN: 500.0000 mL | INTRAVENOUS | Status: DC

## 2014-03-23 MED ORDER — PANTOPRAZOLE SODIUM 40 MG PO TBEC: 40.0000 mg | DELAYED_RELEASE_TABLET | Freq: Every day | ORAL | Status: DC

## 2014-03-23 NOTE — Progress Notes (Signed)
Lidocaine-40mg IV prior to Propofol InductionPropofol given over incremental dosages 

## 2014-03-23 NOTE — Op Note (Signed)
[place]  [address]. [place], [address]   ENDOSCOPY PROCEDURE REPORT  PATIENT: Stephanie Serrano, Stephanie Serrano  MR#: [id number] BIRTHDATE: [birth year] , 89  yrs. old GENDER: Female ENDOSCOPIST: Inda Castle, MD REFERRED BY: PROCEDURE DATE:  03/23/2014 PROCEDURE:  EGD, diagnostic and Maloney dilation of esophagus ASA CLASS:     Class II INDICATIONS:  Dysphagia. MEDICATIONS: MAC sedation, administered by CRNA and propofol (Diprivan) 200mg  IV TOPICAL ANESTHETIC:  DESCRIPTION OF PROCEDURE: After the risks benefits and alternatives of the procedure were thoroughly explained, informed consent was obtained.  The LB [id number] endoscope was introduced through the mouth and advanced to the third portion of the duodenum. Without limitations.  The instrument was slowly withdrawn as the mucosa was fully examined.      At the GE junction there was moderate erythema and a single, superficial 2 mm ulcer.  There was a moderate stricture.  The 9 mm gastroscope easily traversed the stricture.   The remainder of the upper endoscopy exam was otherwise normal.  Retroflexed views revealed no abnormalities.     The scope was then withdrawn from the patient and the procedure completed. A #52 French bony dilator was passed with moderate resistance.  There was no heme.  COMPLICATIONS: There were no complications. ENDOSCOPIC IMPRESSION: 1.   esophagitis with ulcer 2.  esophageal stricture status post Maloney dilation  RECOMMENDATIONS: 1.  Protonix 40 mg daily 2.  repeat dilatation as needed REPEAT EXAM:  eSigned:  Inda Castle, MD 03/23/2014 10:14 AM   CC: Wendie Simmer, MD

## 2014-03-23 NOTE — Progress Notes (Signed)
Called to room to assist during endoscopic procedure.  Patient ID and intended procedure confirmed with present staff. Received instructions for my participation in the procedure from the performing physician.  

## 2014-03-23 NOTE — Patient Instructions (Addendum)
YOU HAD AN ENDOSCOPIC PROCEDURE TODAY AT [facility]: Refer to the procedure report that was given to you for any specific questions about what was found during the examination.  If the procedure report does not answer your questions, please call your gastroenterologist to clarify.  If you requested that your care partner not be given the details of your procedure findings, then the procedure report has been included in a sealed envelope for you to review at your convenience later.  YOU SHOULD EXPECT: Some feelings of bloating in the abdomen. Passage of more gas than usual.  Walking can help get rid of the air that was put into your GI tract during the procedure and reduce the bloating. If you had a lower endoscopy (such as a colonoscopy or flexible sigmoidoscopy) you may notice spotting of blood in your stool or on the toilet paper. If you underwent a bowel prep for your procedure, then you may not have a normal bowel movement for a few days.  DIET:   Nothing by mouth until 11:30am.   Then, you may try water, and progress to a soft diet for the rest of today.  ACTIVITY: Your care partner should take you home directly after the procedure.  You should plan to take it easy, moving slowly for the rest of the day.  You can resume normal activity the day after the procedure however you should NOT DRIVE or use heavy machinery for 24 hours (because of the sedation medicines used during the test).    SYMPTOMS TO REPORT IMMEDIATELY: A gastroenterologist can be reached at any hour.  During normal business hours, 8:30 AM to 5:00 PM Monday through Friday, call [phone].  After hours and on weekends, please call the GI answering service at [phone] who will take a message and have the physician on call contact you.   Following upper endoscopy (EGD)  Vomiting of blood or coffee ground material  New chest pain or pain under the shoulder blades  Painful or persistently difficult  swallowing  New shortness of breath  Fever of 100F or higher  Black, tarry-looking stools  FOLLOW UP: If any biopsies were taken you will be contacted by phone or by letter within the next 1-3 weeks.  Call your gastroenterologist if you have not heard about the biopsies in 3 weeks.  Our staff will call the home number listed on your records the next business day following your procedure to check on you and address any questions or concerns that you may have at that time regarding the information given to you following your procedure. This is a courtesy call and so if there is no answer at the home number and we have not heard from you through the emergency physician on call, we will assume that you have returned to your regular daily activities without incident.  SIGNATURES/CONFIDENTIALITY: You and/or your care partner have signed paperwork which will be entered into your electronic medical record.  These signatures attest to the fact that that the information above on your After Visit Summary has been reviewed and is understood.  Full responsibility of the confidentiality of this discharge information lies with you and/or your care-partner.  Dr. Deatra Ina wants you to start on protonix  40 mg 1/2 hr before breakfast every day.  This will help with your reflux and help to heal your ulcer.  Please, read about esophagitis and GERD , and all of your handouts today.  Script given.

## 2014-03-24 ENCOUNTER — Telehealth: Payer: Self-pay | Admitting: *Deleted

## 2014-03-24 NOTE — Telephone Encounter (Signed)
Message left

## 2014-08-24 ENCOUNTER — Telehealth: Payer: Self-pay | Admitting: Gastroenterology

## 2014-08-24 NOTE — Telephone Encounter (Signed)
Patient has been off of the Pantoprazole for about 3 weeks. She understood from her pharmacist that her insurance no longer covered the medication. She has been experiencing terrible indigestion at night. She is taking OTC Pepcid but is not getting better. Appointment scheduled for her.

## 2014-08-26 ENCOUNTER — Encounter: Payer: Self-pay | Admitting: Nurse Practitioner

## 2014-08-26 ENCOUNTER — Ambulatory Visit (INDEPENDENT_AMBULATORY_CARE_PROVIDER_SITE_OTHER): Payer: Self-pay | Admitting: Nurse Practitioner

## 2014-08-26 ENCOUNTER — Other Ambulatory Visit: Payer: Medicaid Other

## 2014-08-26 VITALS — BP 124/80 | HR 88 | Ht 62.75 in | Wt 197.2 lb

## 2014-08-26 DIAGNOSIS — K219 Gastro-esophageal reflux disease without esophagitis: Secondary | ICD-10-CM

## 2014-08-26 MED ORDER — SUCRALFATE 1 G PO TABS: ORAL_TABLET | ORAL | Status: DC

## 2014-08-26 MED ORDER — OMEPRAZOLE 40 MG PO CPDR: 40.0000 mg | DELAYED_RELEASE_CAPSULE | Freq: Every day | ORAL | Status: DC

## 2014-08-26 NOTE — Progress Notes (Signed)
     History of Present Illness:  Patient is a 48 year old female who was evaluated by Dr. Deatra Ina in April of this year for dysphasia. EGD revealed esophagitis with GE junction ulcer and a moderate stricture, status post Maloney dilation. Dysphasia has resolved.  Patient is here today for recurrent reflux symptoms. Unfortunately Medicaid only paid for a one-month supply of her pantoprazole. For the last month and a half she's been taking Pepcid 10 mg twice daily and over-the-counter antacids.  Burning is worse at night. Patient sleeps on 2 pillows. Food helps the burning so she does eat at night to alleviate the discomfort.   Patient recalls that years ago she had the same exact symptoms. H. Pylori was positive and after treatment with antibiotics the symptoms resolved. Patient would like to be tested for H. Pylori again.  Current Medications, Allergies, Past Medical History, Past Surgical History, Family History and Social History were reviewed in [employer] record.  Physical Exam: General: Pleasant, well developed , female female in no acute distress Head: Normocephalic and atraumatic Eyes:  sclerae anicteric, conjunctiva pink  Ears: Normal auditory acuity Lungs: Clear throughout to auscultation Heart: Regular rate and rhythm Abdomen: Soft, non distended, non-tender. No masses, no hepatomegaly. Normal bowel sounds Musculoskeletal: Symmetrical with no gross deformities  Extremities: No edema  Neurological: Alert oriented x 4, grossly nonfocal Psychological:  Alert and cooperative. Normal mood and affect  Assessment and Recommendations:   44. 48 year old female with GERD. She had esophagitis with ulcer and a moderate GE junction stricture on upper endoscopy in April. No swallowing problems since [facility] dilation.  GERD suboptimally treated with low-dose H2 blockers (Medicaid would not pay for Protonix).  I believe Medicaid will pay for omeprazole. Trial of 40 mg  of omeprazole 30 minutes before breakfast.   Double Pepcid dose to 20 mg and take it at bedtime.   We discussed using a wedge as patient is unable to elevate the head of her bed.   Discouraged eating at night.   GERD literature given.   Will prescribe Carafate for her to use as needed though Medicaid may not pay for this.   Patient will call in the next few weeks with a condition update.  2. History of H. Pylori. Patient would like to be retested for H. Pylori as her current symptoms are similar to what she experienced years ago prior to H. Pylori treatment. Explained to patient that her symptoms are not typical for H. Pylori infection but will be glad to check stool antigen for her.

## 2014-08-26 NOTE — Patient Instructions (Signed)
Please go to the basement level to our lab for stool studies. We sent prescriptions to [facility] [place] tablets. Take 1 tab before meals. 2. Omeprazole 40 mg. Take 1 tab 30 min before breakfast.  Double up on the Pepcid, (Famodidine ) You can get this over the counter.

## 2014-08-27 ENCOUNTER — Encounter: Payer: Self-pay | Admitting: Nurse Practitioner

## 2014-08-27 DIAGNOSIS — K219 Gastro-esophageal reflux disease without esophagitis: Secondary | ICD-10-CM | POA: Insufficient documentation

## 2014-08-28 LAB — H. PYLORI ANTIGEN, STOOL: H pylori Ag, Stl: NEGATIVE

## 2014-08-31 NOTE — Progress Notes (Signed)
Reviewed and agree with management. Leamon Palau D. Meena Barrantes, M.D., FACG  

## 2015-01-31 ENCOUNTER — Telehealth: Payer: Self-pay | Admitting: Gastroenterology

## 2015-01-31 ENCOUNTER — Other Ambulatory Visit: Payer: Self-pay

## 2015-01-31 DIAGNOSIS — R1314 Dysphagia, pharyngoesophageal phase: Secondary | ICD-10-CM

## 2015-01-31 NOTE — Telephone Encounter (Signed)
Schedule EGD with balloon dilation

## 2015-01-31 NOTE — Telephone Encounter (Signed)
Patient is un-insured but she is certified through [facility] and has the orange card. Scheduled her with the first available opening which is a [facility] on 02/09/15.

## 2015-01-31 NOTE — Telephone Encounter (Signed)
Report she has the same problem with food going down as she did before her dilation in April of last year. She is still on Omeprazole. Can I schedule her for an EGD with dilation at the [facility]?

## 2015-02-09 ENCOUNTER — Ambulatory Visit (HOSPITAL_COMMUNITY)
Admission: RE | Admit: 2015-02-09 | Discharge: 2015-02-09 | Disposition: A | Discharge status: Home / Self Care | Payer: Medicaid Other | Source: Ambulatory Visit | Attending: Gastroenterology | Admitting: Gastroenterology

## 2015-02-09 ENCOUNTER — Encounter (HOSPITAL_COMMUNITY): Payer: Self-pay | Admitting: *Deleted

## 2015-02-09 ENCOUNTER — Encounter (HOSPITAL_COMMUNITY)
Admission: RE | Disposition: A | Discharge status: Home / Self Care | Payer: Self-pay | Source: Ambulatory Visit | Attending: Gastroenterology

## 2015-02-09 DIAGNOSIS — R1310 Dysphagia, unspecified: Secondary | ICD-10-CM | POA: Diagnosis present

## 2015-02-09 DIAGNOSIS — G2581 Restless legs syndrome: Secondary | ICD-10-CM | POA: Insufficient documentation

## 2015-02-09 DIAGNOSIS — Z8719 Personal history of other diseases of the digestive system: Secondary | ICD-10-CM | POA: Insufficient documentation

## 2015-02-09 DIAGNOSIS — J45909 Unspecified asthma, uncomplicated: Secondary | ICD-10-CM | POA: Insufficient documentation

## 2015-02-09 DIAGNOSIS — K219 Gastro-esophageal reflux disease without esophagitis: Secondary | ICD-10-CM | POA: Insufficient documentation

## 2015-02-09 DIAGNOSIS — K200 Eosinophilic esophagitis: Secondary | ICD-10-CM | POA: Insufficient documentation

## 2015-02-09 DIAGNOSIS — K209 Esophagitis, unspecified without bleeding: Secondary | ICD-10-CM

## 2015-02-09 DIAGNOSIS — R1314 Dysphagia, pharyngoesophageal phase: Secondary | ICD-10-CM

## 2015-02-09 HISTORY — PX: ESOPHAGOGASTRODUODENOSCOPY: SHX5428

## 2015-02-09 SURGERY — EGD (ESOPHAGOGASTRODUODENOSCOPY)
Anesthesia: Moderate Sedation

## 2015-02-09 MED ORDER — FENTANYL CITRATE 0.05 MG/ML IJ SOLN
INTRAMUSCULAR | Status: DC | PRN
  Administered 2015-02-09 (×3): 25 ug via INTRAVENOUS

## 2015-02-09 MED ORDER — BUTAMBEN-TETRACAINE-BENZOCAINE 2-2-14 % EX AERO
INHALATION_SPRAY | CUTANEOUS | Status: DC | PRN
  Administered 2015-02-09: 2 via TOPICAL

## 2015-02-09 MED ORDER — SODIUM CHLORIDE 0.9 % IV SOLN
INTRAVENOUS | Status: DC
  Administered 2015-02-09: 500 mL via INTRAVENOUS

## 2015-02-09 MED ORDER — MIDAZOLAM HCL 10 MG/2ML IJ SOLN
INTRAMUSCULAR | Status: DC | PRN
  Administered 2015-02-09 (×3): 2 mg via INTRAVENOUS

## 2015-02-09 MED ORDER — OMEPRAZOLE-SODIUM BICARBONATE 40-1100 MG PO CAPS: 1.0000 | ORAL_CAPSULE | Freq: Every day | ORAL | Status: DC

## 2015-02-09 MED ORDER — FENTANYL CITRATE 0.05 MG/ML IJ SOLN
INTRAMUSCULAR | Status: AC
  Filled 2015-02-09: qty 2

## 2015-02-09 MED ORDER — MIDAZOLAM HCL 10 MG/2ML IJ SOLN
INTRAMUSCULAR | Status: AC
  Filled 2015-02-09: qty 2

## 2015-02-09 MED ORDER — DIPHENHYDRAMINE HCL 50 MG/ML IJ SOLN
INTRAMUSCULAR | Status: AC
  Filled 2015-02-09: qty 1

## 2015-02-09 NOTE — H&P (Signed)
               _                                                                                                                History of Present Illness:  Ms. Adolph history of esophageal stricture here for repeat dilation therapy.  She was last dilated in April, 2015 with relief of symptoms.  She has had gradual onset of recurrent dysphagia.  She also complains of regurgitation of gastric contents, particularly at night   Past Medical History  Diagnosis Date  . RLS (restless legs syndrome)   . Anxiety   . Asthma    Past Surgical History  Procedure Laterality Date  . Foot surgery    . Nasal sinus surgery     family history includes Diabetes in her paternal grandmother; Pancreatic cancer in her father. There is no history of Esophageal cancer or Stomach cancer. Current Facility-Administered Medications  Medication Dose Route Frequency Provider Last Rate Last Dose  . 0.9 %  sodium chloride infusion   Intravenous Continuous Inda Castle, MD 20 mL/hr at 02/09/15 0806 500 mL at 02/09/15 0806   Allergies as of 01/31/2015 - Review Complete 08/27/2014  Allergen Reaction Noted  . Clindamycin/lincomycin  03/22/2014  . Penicillins  03/22/2014  . Sulfa antibiotics  03/22/2014    reports that she has never smoked. She has never used smokeless tobacco. She reports that she drinks alcohol. She reports that she does not use illicit drugs.   Review of Systems: Pertinent positive and negative review of systems were noted in the above HPI section. All other review of systems were otherwise negative.  Vital signs were reviewed in today's medical record Physical Exam: General: Well developed , well nourished, no acute distress Skin: anicteric Head: Normocephalic and atraumatic Eyes:  sclerae anicteric, EOMI Ears: Normal auditory acuity Mouth: No deformity or lesions Neck: Supple, no masses or thyromegaly Lungs: Clear throughout to auscultation Heart: Regular rate and rhythm; no  murmurs, rubs or bruits Abdomen: Soft, non tender and non distended. No masses, hepatosplenomegaly or hernias noted. Normal Bowel sounds Rectal:deferred Musculoskeletal: Symmetrical with no gross deformities  Skin: No lesions on visible extremities Pulses:  Normal pulses noted Extremities: No clubbing, cyanosis, edema or deformities noted Neurological: Alert oriented x 4, grossly nonfocal Cervical Nodes:  No significant cervical adenopathy Inguinal Nodes: No significant inguinal adenopathy Psychological:  Alert and cooperative. Normal mood and affect  Impression 1.  Probable recurrent esophageal stricture 2.  GERD  Plan EGD with dilation as indicated

## 2015-02-09 NOTE — Op Note (Signed)
[facility] [place], [address]   ENDOSCOPY PROCEDURE REPORT  PATIENT: Stephanie, Serrano  MR#: [id number] BIRTHDATE: May 10, 1966 , 18  yrs. old GENDER: female ENDOSCOPIST: Inda Castle, MD REFERRED BY: PROCEDURE DATE:  02/09/2015 PROCEDURE:  EGD w/ biopsy and EGD w/ balloon dilation ASA CLASS:     Class II INDICATIONS:  dysphagia. MEDICATIONS: Versed 6 mg IV and Fentanyl 75 mcg IV TOPICAL ANESTHETIC: Cetacaine Spray  DESCRIPTION OF PROCEDURE: After the risks benefits and alternatives of the procedure were thoroughly explained, informed consent was obtained.  The [place] [id number] endoscope was introduced through the mouth and advanced to the second portion of the duodenum , Without limitations.  The instrument was slowly withdrawn as the mucosa was fully examined.    ESOPHAGUS: Abnormal mucosa was found in the entire esophagus. Suggestion of longitudinal folds.  Multiple biopsies were performed using cold forceps.  Sample obtained for evaluation of eosinophilic esophagitis.   Except for the findings listed the EGD was otherwise normal.  Retroflexed views revealed no abnormalities.   because of complaints of dysphagia and #18 mm to the scope balloon dilator was inflated for 30 seconds.  There was no heme.  The scope was then withdrawn from the patient and the procedure completed.  COMPLICATIONS: There were no immediate complications.  ENDOSCOPIC IMPRESSION: r/o eosinophilic esophagitis s/p balloon dilation  RECOMMENDATIONS: Await biopsy results Trial of zegerid 40mg  qhs OV 4-5 weeks  REPEAT EXAM:  eSigned:  Inda Castle, MD 02/09/2015 9:13 AM    CC: Wendie Simmer, MD

## 2015-02-09 NOTE — Discharge Instructions (Signed)
Discontinue omeprazole and carafate

## 2015-02-10 ENCOUNTER — Encounter (HOSPITAL_COMMUNITY): Payer: Self-pay | Admitting: Gastroenterology

## 2015-02-14 ENCOUNTER — Other Ambulatory Visit: Payer: Self-pay

## 2015-02-16 ENCOUNTER — Encounter: Payer: Self-pay | Admitting: Gastroenterology

## 2015-02-17 ENCOUNTER — Encounter: Payer: Self-pay | Admitting: Internal Medicine

## 2015-02-17 ENCOUNTER — Ambulatory Visit (INDEPENDENT_AMBULATORY_CARE_PROVIDER_SITE_OTHER): Payer: No Typology Code available for payment source | Admitting: Internal Medicine

## 2015-02-17 VITALS — BP 142/88 | HR 84 | Temp 98.2°F | Resp 16 | Ht 62.0 in | Wt 194.0 lb

## 2015-02-17 DIAGNOSIS — IMO0001 Reserved for inherently not codable concepts without codable children: Secondary | ICD-10-CM

## 2015-02-17 DIAGNOSIS — R030 Elevated blood-pressure reading, without diagnosis of hypertension: Secondary | ICD-10-CM

## 2015-02-17 DIAGNOSIS — R5383 Other fatigue: Secondary | ICD-10-CM

## 2015-02-17 LAB — COMPREHENSIVE METABOLIC PANEL
ALBUMIN: 4 g/dL (ref 3.5–5.2)
ALK PHOS: 82 U/L (ref 39–117)
ALT: 14 U/L (ref 0–35)
AST: 16 U/L (ref 0–37)
BILIRUBIN TOTAL: 0.3 mg/dL (ref 0.2–1.2)
BUN: 12 mg/dL (ref 6–23)
CALCIUM: 8.8 mg/dL (ref 8.4–10.5)
CO2: 21 mEq/L (ref 19–32)
Chloride: 103 mEq/L (ref 96–112)
Creat: 0.68 mg/dL (ref 0.50–1.10)
GLUCOSE: 78 mg/dL (ref 70–99)
Potassium: 4 mEq/L (ref 3.5–5.3)
SODIUM: 137 meq/L (ref 135–145)
Total Protein: 6.6 g/dL (ref 6.0–8.3)

## 2015-02-17 LAB — CBC WITH DIFFERENTIAL/PLATELET
Basophils Absolute: 0 10*3/uL (ref 0.0–0.1)
Basophils Relative: 0 % (ref 0–1)
EOS ABS: 0.9 10*3/uL — AB (ref 0.0–0.7)
Eosinophils Relative: 12 % — ABNORMAL HIGH (ref 0–5)
HCT: 38.5 % (ref 36.0–46.0)
Hemoglobin: 12.8 g/dL (ref 12.0–15.0)
LYMPHS ABS: 2.5 10*3/uL (ref 0.7–4.0)
LYMPHS PCT: 33 % (ref 12–46)
MCH: 28.8 pg (ref 26.0–34.0)
MCHC: 33.2 g/dL (ref 30.0–36.0)
MCV: 86.7 fL (ref 78.0–100.0)
MONOS PCT: 9 % (ref 3–12)
MPV: 10.2 fL (ref 8.6–12.4)
Monocytes Absolute: 0.7 10*3/uL (ref 0.1–1.0)
NEUTROS ABS: 3.5 10*3/uL (ref 1.7–7.7)
NEUTROS PCT: 46 % (ref 43–77)
PLATELETS: 278 10*3/uL (ref 150–400)
RBC: 4.44 MIL/uL (ref 3.87–5.11)
RDW: 14.6 % (ref 11.5–15.5)
WBC: 7.6 10*3/uL (ref 4.0–10.5)

## 2015-02-17 NOTE — Progress Notes (Signed)
Patient ID: Sitlali Koerner, female   DOB: [birth year], 49 y.o.   MRN: [id number]   Angelin Cutrone, is a 49 y.o. female  KGU:[id number]  SEG:[id number]  DOB - 1966/03/06  CC:  Chief Complaint  Patient presents with  . Establish Care    would like medication for motion sickness/vertigo        HPI: Celeste Tavenner is a 49 y.o. female here today to establish medical care. She has a history of esophageal stricture and recently had a stretching of the Esophagus and was prescribed Zegrid. However the medication was cost prohibitive and she was unable to obtain the medication. She has not had any difficulty with swallowing since the esophageal procedure. She also has a history of vertigo but has not required any medication for more than 1 year. She also had a history of reactive airway disease (RAD) but has not been formally diagnosed with asthma. She reports seasonal allergies which apparently lead to reactive airway disease.   Pt is overdue for TdaP but states that she had a reaction to it in the 80's and was told that she should not have it again. Patient has No headache, No chest pain, No abdominal pain - No Nausea, No new weakness tingling or numbness, No Cough - SOB.  Allergies  Allergen Reactions  . Azithromycin   . Clindamycin/Lincomycin   . Penicillins   . Sulfa Antibiotics    Past Medical History  Diagnosis Date  . RLS (restless legs syndrome)   . Vertigo   . Migraines    Current Outpatient Prescriptions on File Prior to Visit  Medication Sig Dispense Refill  . omeprazole-sodium bicarbonate (ZEGERID) 40-1100 MG per capsule Take 1 capsule by mouth at bedtime. (Patient not taking: Reported on 02/17/2015) 30 capsule 5   No current facility-administered medications on file prior to visit.   Family History  Problem Relation Age of Onset  . Pancreatic cancer Father   . Diabetes Paternal Grandmother   . Esophageal cancer Neg Hx   . Stomach cancer Neg Hx   . Diabetes Mother   .  Heart disease Mother   . Anxiety disorder Brother    History   Social History  . Marital Status: Divorced    Spouse Name: N/A  . Number of Children: 2  . Years of Education: N/A   Occupational History  . [employer]    Social History Main Topics  . Smoking status: Never Smoker   . Smokeless tobacco: Never Used  . Alcohol Use: Yes     Comment: socially  . Drug Use: No  . Sexual Activity: No   Other Topics Concern  . Not on file   Social History Narrative    Review of Systems: Constitutional: Negative for fever, chills, diaphoresis, activity change, appetite change and fatigue. HENT: Negative for ear pain, nosebleeds, congestion, facial swelling, rhinorrhea, neck pain, neck stiffness and ear discharge.  Eyes: Negative for pain, discharge, redness, itching and visual disturbance. Respiratory: Negative for cough, choking, chest tightness, shortness of breath, wheezing and stridor.  Cardiovascular: Negative for chest pain, palpitations and leg swelling. Gastrointestinal: Negative for abdominal distention. Genitourinary: Negative for dysuria, urgency, frequency, hematuria, flank pain, decreased urine volume, difficulty urinating.  Musculoskeletal: Negative for back pain, joint swelling, arthralgia and gait problem. Neurological: Negative for dizziness, tremors, seizures, syncope, facial asymmetry, speech difficulty, weakness, light-headedness, numbness and headaches.  Hematological: Negative for adenopathy. Does not bruise/bleed easily. Psychiatric/Behavioral: Negative for hallucinations, behavioral problems, confusion, dysphoric mood, decreased concentration and  agitation.   Objective:   Filed Vitals:   02/17/15 1553  BP: 142/88  Pulse: 84  Temp: 98.2 (36.8)  Resp: 18    Physical Exam: Constitutional: Patient appears well-developed and well-nourished. No distress. HENT: Normocephalic, atraumatic, External right and left ear normal. Oropharynx is clear and moist.  Eyes:  Conjunctivae and EOM are normal. PERRLA, no scleral icterus. Neck: Normal ROM. Neck supple. No JVD. No tracheal deviation. No thyromegaly. CVS: RRR, S1/S2 +, no murmurs, no gallops, no carotid bruit.  Pulmonary: Effort and breath sounds normal, no stridor, rhonchi, wheezes, rales.  Abdominal: Soft. BS +, no distension, tenderness, rebound or guarding.  Musculoskeletal: Normal range of motion. No edema and no tenderness.  Lymphadenopathy: No lymphadenopathy noted, cervical, inguinal or axillary Neuro: Alert. Normal reflexes, muscle tone coordination. No cranial nerve deficit. Skin: Skin is warm and dry. No rash noted. Not diaphoretic. No erythema. No pallor. Psychiatric: Normal mood and affect. Behavior, judgment, thought content normal.   Lab Results  Component Value Date   WBC 8.0 02/21/2011   HGB 13.2 02/21/2011   HCT 40.0 02/21/2011   MCV 88.1 02/21/2011   PLT 326 02/21/2011   Lab Results  Component Value Date   CREATININE 0.85 02/21/2011   BUN 10 02/21/2011   NA 142 02/21/2011   K 5.0 02/21/2011   CL 107 02/21/2011   CO2 21 02/21/2011    No results found for: HGBA1C Lipid Panel  No results found for: CHOL, TRIG, HDL, CHOLHDL, VLDL, LDLCALC     Assessment and plan:   1. Elevated BP - Pt has a mildly elevated BP. A review of BP from other visits in the system showed elevated BP. A recheck of her BP shows a SBP in the 140's. We have discussed exercise, diet and weight loss as conservative measures to lower SBP with a target of the 120's.  - CBC with Differential/Platelet - Comprehensive metabolic panel - Urinalysis  2. Other fatigue - Pt with non-specific fatigue that is impacting her daily activity. Will check for anemia, metabolic causes and Thyroid function. - CBC with Differential/Platelet - TSH - Urinalysis  3. Pt has a history of allergic rhinitis which may induce RAD. If she has symptoms of this will consider Leukotriene inhibitor (Singulair).    Follow-up 6  months. Annual Physical..  The patient was given clear instructions to go to ER or return to medical center if symptoms don't improve, worsen or new problems develop. The patient verbalized understanding. The patient was told to call to get lab results if they haven't heard anything in the next week.     This note has been created with Surveyor, quantity. Any transcriptional errors are unintentional.    Shaleen Talamantez A., MD [place], [place]   02/17/2015, 3:59 PM

## 2015-02-18 LAB — URINALYSIS
Bilirubin Urine: NEGATIVE
Glucose, UA: NEGATIVE mg/dL
HGB URINE DIPSTICK: NEGATIVE
Ketones, ur: 40 mg/dL — AB
LEUKOCYTES UA: NEGATIVE
NITRITE: NEGATIVE
PROTEIN: NEGATIVE mg/dL
SPECIFIC GRAVITY, URINE: 1.013 (ref 1.005–1.030)
UROBILINOGEN UA: 0.2 mg/dL (ref 0.0–1.0)
pH: 5 (ref 5.0–8.0)

## 2015-02-21 ENCOUNTER — Other Ambulatory Visit: Payer: Self-pay

## 2015-02-21 MED ORDER — FAMOTIDINE 20 MG PO TABS: 20.0000 mg | ORAL_TABLET | Freq: Every day | ORAL | Status: DC

## 2015-02-21 MED ORDER — OMEPRAZOLE 20 MG PO CPDR: 20.0000 mg | DELAYED_RELEASE_CAPSULE | Freq: Every day | ORAL | Status: DC

## 2015-02-24 ENCOUNTER — Encounter: Payer: Self-pay | Admitting: *Deleted

## 2015-03-21 ENCOUNTER — Ambulatory Visit (INDEPENDENT_AMBULATORY_CARE_PROVIDER_SITE_OTHER): Payer: Medicaid Other | Admitting: Family Medicine

## 2015-03-21 ENCOUNTER — Encounter: Payer: Self-pay | Admitting: Family Medicine

## 2015-03-21 ENCOUNTER — Ambulatory Visit: Payer: Medicaid Other | Admitting: Gastroenterology

## 2015-03-21 VITALS — BP 115/77 | HR 76 | Temp 98.2°F | Resp 16 | Ht 62.0 in | Wt 189.0 lb

## 2015-03-21 DIAGNOSIS — F32A Depression, unspecified: Secondary | ICD-10-CM

## 2015-03-21 DIAGNOSIS — B029 Zoster without complications: Secondary | ICD-10-CM | POA: Diagnosis not present

## 2015-03-21 DIAGNOSIS — F329 Major depressive disorder, single episode, unspecified: Secondary | ICD-10-CM | POA: Diagnosis not present

## 2015-03-21 LAB — TSH: TSH: 1.776 u[IU]/mL (ref 0.350–4.500)

## 2015-03-21 MED ORDER — ACYCLOVIR 800 MG PO TABS: 800.0000 mg | ORAL_TABLET | Freq: Every day | ORAL | Status: DC

## 2015-03-21 MED ORDER — SERTRALINE HCL 25 MG PO TABS: 25.0000 mg | ORAL_TABLET | Freq: Every day | ORAL | Status: DC

## 2015-03-21 NOTE — Patient Instructions (Signed)
Depression Depression refers to feeling sad, low, down in the dumps, blue, gloomy, or empty. In general, there are two kinds of depression:  Normal sadness or normal grief. This kind of depression is one that we all feel from time to time after upsetting life experiences, such as the loss of a job or the ending of a relationship. This kind of depression is considered normal, is short lived, and resolves within a few days to 2 weeks. Depression experienced after the loss of a loved one (bereavement) often lasts longer than 2 weeks but normally gets better with time.  Clinical depression. This kind of depression lasts longer than normal sadness or normal grief or interferes with your ability to function at home, at work, and in school. It also interferes with your personal relationships. It affects almost every aspect of your life. Clinical depression is an illness. Symptoms of depression can also be caused by conditions other than those mentioned above, such as:  Physical illness. Some physical illnesses, including underactive thyroid gland (hypothyroidism), severe anemia, specific types of cancer, diabetes, uncontrolled seizures, heart and lung problems, strokes, and chronic pain are commonly associated with symptoms of depression.  Side effects of some prescription medicine. In some people, certain types of medicine can cause symptoms of depression.  Substance abuse. Abuse of alcohol and illicit drugs can cause symptoms of depression. SYMPTOMS Symptoms of normal sadness and normal grief include the following:  Feeling sad or crying for short periods of time.  Not caring about anything (apathy).  Difficulty sleeping or sleeping too much.  No longer able to enjoy the things you used to enjoy.  Desire to be by oneself all the time (social isolation).  Lack of energy or motivation.  Difficulty concentrating or remembering.  Change in appetite or weight.  Restlessness or  agitation. Symptoms of clinical depression include the same symptoms of normal sadness or normal grief and also the following symptoms:  Feeling sad or crying all the time.  Feelings of guilt or worthlessness.  Feelings of hopelessness or helplessness.  Thoughts of suicide or the desire to harm yourself (suicidal ideation).  Loss of touch with reality (psychotic symptoms). Seeing or hearing things that are not real (hallucinations) or having false beliefs about your life or the people around you (delusions and paranoia). DIAGNOSIS  The diagnosis of clinical depression is usually based on how bad the symptoms are and how long they have lasted. Your health care provider will also ask you questions about your medical history and substance use to find out if physical illness, use of prescription medicine, or substance abuse is causing your depression. Your health care provider may also order blood tests. TREATMENT  Often, normal sadness and normal grief do not require treatment. However, sometimes antidepressant medicine is given for bereavement to ease the depressive symptoms until they resolve. The treatment for clinical depression depends on how bad the symptoms are but often includes antidepressant medicine, counseling with a mental health professional, or both. Your health care provider will help to determine what treatment is best for you. Depression caused by physical illness usually goes away with appropriate medical treatment of the illness. If prescription medicine is causing depression, talk with your health care provider about stopping the medicine, decreasing the dose, or changing to another medicine. Depression caused by the abuse of alcohol or illicit drugs goes away when you stop using these substances. Some adults need professional help in order to stop drinking or using drugs. [place]  CARE IF:  You have thoughts about hurting yourself or others.  You lose touch  with reality (have psychotic symptoms).  You are taking medicine for depression and have a serious side effect. FOR MORE INFORMATION  National Alliance on Mental Illness: www.nami.[employer]: https://carter.com/ Document Released: 11/23/2000 Document Revised: 04/12/2014 Document Reviewed: 02/25/2012 [facility] Patient Information 2015 [place], [place]. This information is not intended to replace advice given to you by your health care provider. Make sure you discuss any questions you have with your health care provider. Shingles Shingles (herpes zoster) is an infection that is caused by the same virus that causes chickenpox (varicella). The infection causes a painful skin rash and fluid-filled blisters, which eventually break open, crust over, and heal. It may occur in any area of the body, but it usually affects only one side of the body or face. The pain of shingles usually lasts about 1 month. However, some people with shingles may develop long-term (chronic) pain in the affected area of the body. Shingles often occurs many years after the person had chickenpox. It is more common:  In people older than 50 years.  In people with weakened immune systems, such as those with HIV, AIDS, or cancer.  In people taking medicines that weaken the immune system, such as transplant medicines.  In people under great stress. CAUSES  Shingles is caused by the varicella zoster virus (VZV), which also causes chickenpox. After a person is infected with the virus, it can remain in the person's body for years in an inactive state (dormant). To cause shingles, the virus reactivates and breaks out as an infection in a nerve root. The virus can be spread from person to person (contagious) through contact with open blisters of the shingles rash. It will only spread to people who have not had chickenpox. When these people are exposed to the virus, they may develop chickenpox. They will not  develop shingles. Once the blisters scab over, the person is no longer contagious and cannot spread the virus to others. SIGNS AND SYMPTOMS  Shingles shows up in stages. The initial symptoms may be pain, itching, and tingling in an area of the skin. This pain is usually described as burning, stabbing, or throbbing.In a few days or weeks, a painful red rash will appear in the area where the pain, itching, and tingling were felt. The rash is usually on one side of the body in a band or belt-like pattern. Then, the rash usually turns into fluid-filled blisters. They will scab over and dry up in approximately 2-3 weeks. Flu-like symptoms may also occur with the initial symptoms, the rash, or the blisters. These may include:  Fever.  Chills.  Headache.  Upset stomach. DIAGNOSIS  Your health care provider will perform a skin exam to diagnose shingles. Skin scrapings or fluid samples may also be taken from the blisters. This sample will be examined under a microscope or sent to a lab for further testing. TREATMENT  There is no specific cure for shingles. Your health care provider will likely prescribe medicines to help you manage the pain, recover faster, and avoid long-term problems. This may include antiviral drugs, anti-inflammatory drugs, and pain medicines. HOME CARE INSTRUCTIONS   Take a cool bath or apply cool compresses to the area of the rash or blisters as directed. This may help with the pain and itching.   Take medicines only as directed by your health care provider.   Rest as directed by  your health care provider.  Keep your rash and blisters clean with mild soap and cool water or as directed by your health care provider.  Do not pick your blisters or scratch your rash. Apply an anti-itch cream or numbing creams to the affected area as directed by your health care provider.  Keep your shingles rash covered with a loose bandage (dressing).  Avoid skin contact with:  Babies.    Pregnant women.   Children with eczema.   Elderly people with transplants.   People with chronic illnesses, such as leukemia or AIDS.   Wear loose-fitting clothing to help ease the pain of material rubbing against the rash.  Keep all follow-up visits as directed by your health care provider.If the area involved is on your face, you may receive a referral for a specialist, such as an eye doctor (ophthalmologist) or an ear, nose, and throat (ENT) doctor. Keeping all follow-up visits will help you avoid eye problems, chronic pain, or disability.  SEEK IMMEDIATE MEDICAL CARE IF:   You have facial pain, pain around the eye area, or loss of feeling on one side of your face.  You have ear pain or ringing in your ear.  You have loss of taste.  Your pain is not relieved with prescribed medicines.   Your redness or swelling spreads.   You have more pain and swelling.  Your condition is worsening or has changed.   You have a fever. MAKE SURE YOU:  Understand these instructions.  Will watch your condition.  Will get help right away if you are not doing well or get worse. Document Released: 11/26/2005 Document Revised: 04/12/2014 Document Reviewed: 07/10/2012 [facility] Patient Information 2015 [place], [place]. This information is not intended to replace advice given to you by your health care provider. Make sure you discuss any questions you have with your health care provider.

## 2015-03-21 NOTE — Progress Notes (Signed)
Subjective:    Patient ID: Stephanie Serrano, female    DOB: [birth year], 49 y.o.   MRN: [id number]  HPI   Ms.  Stephanie Serrano, a 49 year old female presents with a rash to left flank. She states that the rash appeared several days ago. She describes the rash as itching, tingling, and burning.  At onset, the rash appeared as several blisters grouped together. Ms. Behring reports that the rash has currently spread in a linear fashion.   Patient has not had new exposures (soaps, lotions, laundry detergents, foods, medications, plants, insects or animals.)   Ms. Mood is also complaining of depression. She reports that she has a long history of depression and was previously on antidepressants. She states that her depression has increased over the past several months due to a current family issue. She states that she has attempted exercise, balanced diet to control the stress and anxiety without relief.  She complains of anhedonia, depressed mood, difficulty concentrating, fatigue and insomnia. She denies current suicidal and homicidal plan or intent.   Family history significant for depression. Past Medical History  Diagnosis Date  . RLS (restless legs syndrome)   . Vertigo   . Migraines   . GERD (gastroesophageal reflux disease)   . Esophageal stricture    History   Social History  . Marital Status: Divorced    Spouse Name: N/A  . Number of Children: 2  . Years of Education: N/A   Occupational History  . [employer]    Social History Main Topics  . Smoking status: Never Smoker   . Smokeless tobacco: Never Used  . Alcohol Use: Yes     Comment: socially  . Drug Use: No  . Sexual Activity: No   Other Topics Concern  . Not on file   Social History Narrative   Review of Systems  Constitutional: Negative.   HENT: Negative.   Eyes: Negative.   Respiratory: Negative.   Cardiovascular: Negative.   Gastrointestinal: Negative.   Endocrine: Negative.   Genitourinary: Negative.     Musculoskeletal: Negative.   Skin: Positive for rash.  Allergic/Immunologic: Negative.   Neurological: Negative.   Hematological: Negative.   Psychiatric/Behavioral: Negative.        Objective:   Physical Exam  Constitutional: She is oriented to person, place, and time. She appears well-developed and well-nourished.  HENT:  Head: Normocephalic and atraumatic.  Right Ear: External ear normal.  Left Ear: External ear normal.  Mouth/Throat: Oropharynx is clear and moist.  Eyes: Conjunctivae and EOM are normal. Pupils are equal, round, and reactive to light.  Neck: Normal range of motion. Neck supple.  Pulmonary/Chest: Effort normal and breath sounds normal.  Abdominal: Soft. Bowel sounds are normal.  Musculoskeletal: Normal range of motion.  Neurological: She is alert and oriented to person, place, and time. She has normal reflexes.  Skin: Skin is warm, dry and intact. Lesion and rash noted. Rash is vesicular. There is erythema.     Psychiatric: She has a normal mood and affect. Her speech is normal and behavior is normal. Judgment and thought content normal.         BP 115/77 mmHg  Pulse 76  Temp(Src) 98.2 F (36.8 C) (Oral)  Resp 16  Ht 5\' 2"  (1.575 m)  Wt 189 lb (85.73 kg)  BMI 34.56 kg/m2  LMP 03/17/2015  Assessment & Plan:  1. Herpes zoster Patient currently has a linear rash along a dermatome described, as burning, tingling, and itching.  Patient has been under a great deal of stress over the past several months. Will start acyclovir for 7 days. Recommend applying cool compresses 4-5 times per day for 20 minutes as needed.   - acyclovir (ZOVIRAX) 800 MG tablet; Take 1 tablet (800 mg total) by mouth 5 (five) times daily.  Dispense: 35 tablet; Refill: 0  2. Depression Current PHQ-9 screen is 13. Patient has been under the care of a therapist in the past. She has not followed up due to cost constraints. Will re-start Zoloft 25 mg daily. Patient denies suicidal or  homicidal ideations.  - sertraline (ZOLOFT) 25 MG tablet; Take 1 tablet (25 mg total) by mouth daily.  Dispense: 30 tablet; Refill: 2 - TSH   Coty Student M, FNP

## 2015-06-27 ENCOUNTER — Encounter: Payer: Self-pay | Admitting: Family Medicine

## 2015-06-27 ENCOUNTER — Ambulatory Visit (INDEPENDENT_AMBULATORY_CARE_PROVIDER_SITE_OTHER): Payer: No Typology Code available for payment source | Admitting: Family Medicine

## 2015-06-27 VITALS — BP 120/83 | HR 78 | Temp 98.3°F | Resp 20 | Ht 63.0 in | Wt 191.0 lb

## 2015-06-27 DIAGNOSIS — B029 Zoster without complications: Secondary | ICD-10-CM

## 2015-06-27 NOTE — Patient Instructions (Signed)
No special instructions needed Follow-up as needed.

## 2015-06-27 NOTE — Progress Notes (Signed)
Patient ID: Stephanie Serrano, female   DOB: 06-01-1966, 49 y.o.   MRN: [id number]   Stephanie Serrano, is a 49 y.o. female  [id number]:[id number]    DOB - Aug 10, 1966  CC: No chief complaint on file.       HPI: Patient presents today for a follow-up from a visit in April for shingles.  She was treated with Acyclovir and reports the shingles have resolved without lingering pain.  When she was here in March her BP was elevated but there were concerns about the accuracy of the equipment. She was not started on any medications. Her BP today is within normal limits. She has a history of Reflux and esophagitis and is followed and treated by GI.  She is returning in Sept for a physical.        Allergies  Allergen Reactions  . Azithromycin   . Clindamycin/Lincomycin   . Penicillins   . Sulfa Antibiotics     Past Medical History  Diagnosis Date  . RLS (restless legs syndrome)   . Vertigo   . Migraines   . GERD (gastroesophageal reflux disease)   . Esophageal stricture    [name]'s family history includes Anxiety disorder in her brother; Diabetes in her mother and paternal grandmother; Heart disease in her mother; Pancreatic cancer in her father. There is no history of Esophageal cancer or Stomach cancer.  History   Social History  . Marital Status: Divorced    Spouse Name: N/A  . Number of Children: 2  . Years of Education: N/A   Occupational History  . [employer]    Social History Main Topics  . Smoking status: Never Smoker   . Smokeless tobacco: Never Used  . Alcohol Use: Yes     Comment: socially  . Drug Use: No  . Sexual Activity: No   Other Topics Concern  . Not on file   Social History Narrative   Past Surgical History  Procedure Laterality Date  . Foot surgery    . Nasal sinus surgery    . Esophagogastroduodenoscopy N/A 02/09/2015    Procedure: ESOPHAGOGASTRODUODENOSCOPY (EGD);  Surgeon: Inda Castle, MD;  Location: [facility];  Service: Endoscopy;  Laterality: N/A;   with dilation       ROS:  GEN:   Denies fever, chills,WT loss, loss of appetite Skin:   Denies lesions or rashes HENT:   Denies  earache, epistaxis, sore throat, or neck pain, or headaches EYES:   Denies eye pain or drainage.               LUNGS:  Denies SOB with rest or walking; couging, choking CV:   Denies CP or palpitations ABD:   Denies abdominal pain, nausea,and vomiting, diarrhea  GU:   Denies frequency, urgency, dysuria          EXT:    Denies muscle spasms or swelling; no pain in lower ext,no unilateral    weakness  NEURO:   Denies numbness or tingling, denies seizures  Objective:  Filed Vitals:   06/27/15 1331  BP: 120/83  Pulse: 78  Temp: 98.3 F (36.8 C)  TempSrc: Oral  Resp: 20  Height: 5\' 3"  (1.6 m)  Weight: 191 lb (86.637 kg)  SpO2: 100%    Physical Exam:  General:   Well-developed, well-nourished, in no acute distress Skin:      Warm and dry, no significant rashes.     Head :    Normocephalic, atraumatic, no pallor, Eyes:  No icterus, drainage, PERL, EOMS intact Ears:      Clear bilaterally, TMs within normal limits. Nose:   nares patent w/o significant congestion or inflammation   Neck:      Supple FROM w/o adenopathy, tenderness, or thyroidomegaly     Heart:     Normal  RRR,without M,G,R Lungs:    Clear to auscultation bilaterally. No increase in respiratory effort.No wheezes, rales or stridor Abdomen:  Soft, nontender, nondistended, positive bowel sounds, no guarding or  rebound Exetremeties:  No pedal edema.FROM, 2+pedal pulses, No unilateral weakness. Foot   Neuro:   Alert, oriented, appropriate, non-focal.  Pertinent Lab Results:  Lab Results  Component Value Date   WBC 7.6 02/17/2015   HGB 12.8 02/17/2015   HCT 38.5 02/17/2015   MCV 86.7 02/17/2015   PLT 278 02/17/2015     Chemistry      Component Value Date/Time   NA 137 02/17/2015 1558   K 4.0 02/17/2015 1558   CL 103 02/17/2015 1558   CO2 21 02/17/2015 1558   BUN 12  02/17/2015 1558   CREATININE 0.68 02/17/2015 1558   CREATININE 0.85 02/21/2011 0241      Component Value Date/Time   CALCIUM 8.8 02/17/2015 1558   ALKPHOS 82 02/17/2015 1558   AST 16 02/17/2015 1558   ALT 14 02/17/2015 1558   BILITOT 0.3 02/17/2015 1558      No results found for: HGBA1C  Medications: Prior to Admission medications   Medication Sig Start Date End Date Taking? Authorizing Provider  omeprazole (PRILOSEC) 20 MG capsule Take 1 capsule (20 mg total) by mouth daily. 02/21/15  Yes Inda Castle, MD  sertraline (ZOLOFT) 25 MG tablet Take 1 tablet (25 mg total) by mouth daily. 03/21/15  Yes Dorena Dew, FNP  acyclovir (ZOVIRAX) 800 MG tablet Take 1 tablet (800 mg total) by mouth 5 (five) times daily. Patient not taking: Reported on 06/27/2015 03/21/15   Dorena Dew, FNP  famotidine (PEPCID) 20 MG tablet Take 1 tablet (20 mg total) by mouth daily. Patient not taking: Reported on 03/21/2015 02/21/15   Inda Castle, MD  meclizine (ANTIVERT) 12.5 MG tablet Take 12.5 mg by mouth 3 (three) times daily as needed for dizziness.    Historical Provider, MD  omeprazole-sodium bicarbonate (ZEGERID) 40-1100 MG per capsule Take 1 capsule by mouth at bedtime. Patient not taking: Reported on 02/17/2015 02/09/15   Inda Castle, MD    Assessment: 1.  Shingles, resolved        -no special instructions, follow-up as needed 2.  GERD      -follow-up with GI as planned  Follow-up here in September for  Health maintenance exam.  The patient was given clear instructions to go to ER or return to medical center if symptoms don't improve, worsen or new problems develop. The patient verbalized understanding.     Micheline Chapman, FNP,BC 06/27/2015, 1:42 PM

## 2015-08-22 ENCOUNTER — Other Ambulatory Visit (HOSPITAL_COMMUNITY)
Admission: RE | Admit: 2015-08-22 | Discharge: 2015-08-22 | Disposition: A | Discharge status: Home / Self Care | Payer: Medicaid Other | Source: Ambulatory Visit | Attending: Internal Medicine | Admitting: Internal Medicine

## 2015-08-22 ENCOUNTER — Encounter: Payer: Self-pay | Admitting: Family Medicine

## 2015-08-22 ENCOUNTER — Ambulatory Visit (INDEPENDENT_AMBULATORY_CARE_PROVIDER_SITE_OTHER): Payer: Medicaid Other | Admitting: Family Medicine

## 2015-08-22 VITALS — BP 138/85 | HR 85 | Temp 98.2°F | Resp 14 | Ht 62.0 in | Wt 194.0 lb

## 2015-08-22 DIAGNOSIS — Z0000 Encounter for general adult medical examination without abnormal findings: Secondary | ICD-10-CM | POA: Diagnosis not present

## 2015-08-22 DIAGNOSIS — Z01419 Encounter for gynecological examination (general) (routine) without abnormal findings: Secondary | ICD-10-CM | POA: Insufficient documentation

## 2015-08-22 DIAGNOSIS — Z23 Encounter for immunization: Secondary | ICD-10-CM

## 2015-08-22 DIAGNOSIS — K209 Esophagitis, unspecified: Secondary | ICD-10-CM

## 2015-08-22 DIAGNOSIS — Z9289 Personal history of other medical treatment: Secondary | ICD-10-CM

## 2015-08-22 LAB — POCT URINALYSIS DIP (DEVICE)
Bilirubin Urine: NEGATIVE
GLUCOSE, UA: NEGATIVE mg/dL
Hgb urine dipstick: NEGATIVE
Ketones, ur: NEGATIVE mg/dL
Leukocytes, UA: NEGATIVE
NITRITE: NEGATIVE
PROTEIN: NEGATIVE mg/dL
Specific Gravity, Urine: 1.01 (ref 1.005–1.030)
Urobilinogen, UA: 0.2 mg/dL (ref 0.0–1.0)
pH: 6 (ref 5.0–8.0)

## 2015-08-22 LAB — COMPLETE METABOLIC PANEL WITH GFR
ALBUMIN: 4 g/dL (ref 3.6–5.1)
ALK PHOS: 75 U/L (ref 33–115)
ALT: 16 U/L (ref 6–29)
AST: 17 U/L (ref 10–35)
BUN: 10 mg/dL (ref 7–25)
CALCIUM: 8.7 mg/dL (ref 8.6–10.2)
CHLORIDE: 103 mmol/L (ref 98–110)
CO2: 26 mmol/L (ref 20–31)
Creat: 0.67 mg/dL (ref 0.50–1.10)
GFR, Est African American: >89 mL/min (ref >=60)
GFR, Est Non African American: >89 mL/min (ref >=60)
Glucose, Bld: 82 mg/dL (ref 65–99)
POTASSIUM: 4.3 mmol/L (ref 3.5–5.3)
Sodium: 138 mmol/L (ref 135–146)
Total Bilirubin: 0.3 mg/dL (ref 0.2–1.2)
Total Protein: 6.6 g/dL (ref 6.1–8.1)

## 2015-08-22 LAB — CBC WITH DIFFERENTIAL/PLATELET
Basophils Absolute: 0 10*3/uL (ref 0.0–0.1)
Basophils Relative: 0 % (ref 0–1)
EOS PCT: 4 % (ref 0–5)
Eosinophils Absolute: 0.3 10*3/uL (ref 0.0–0.7)
HCT: 38.4 % (ref 36.0–46.0)
Hemoglobin: 12.8 g/dL (ref 12.0–15.0)
LYMPHS PCT: 39 % (ref 12–46)
Lymphs Abs: 3.3 10*3/uL (ref 0.7–4.0)
MCH: 29.6 pg (ref 26.0–34.0)
MCHC: 33.3 g/dL (ref 30.0–36.0)
MCV: 88.9 fL (ref 78.0–100.0)
MONO ABS: 0.8 10*3/uL (ref 0.1–1.0)
MONOS PCT: 9 % (ref 3–12)
MPV: 10.1 fL (ref 8.6–12.4)
Neutro Abs: 4.1 10*3/uL (ref 1.7–7.7)
Neutrophils Relative %: 48 % (ref 43–77)
Platelets: 303 10*3/uL (ref 150–400)
RBC: 4.32 MIL/uL (ref 3.87–5.11)
RDW: 13.9 % (ref 11.5–15.5)
WBC: 8.5 10*3/uL (ref 4.0–10.5)

## 2015-08-22 LAB — TSH: TSH: 1.989 u[IU]/mL (ref 0.350–4.500)

## 2015-08-22 LAB — LIPID PANEL
CHOLESTEROL: 243 mg/dL — AB (ref 125–200)
HDL: 57 mg/dL (ref >=46)
LDL Cholesterol: 155 mg/dL — ABNORMAL HIGH (ref <130)
Total CHOL/HDL Ratio: 4.3 Ratio (ref <=5.0)
Triglycerides: 153 mg/dL — ABNORMAL HIGH (ref <150)
VLDL: 31 mg/dL — ABNORMAL HIGH (ref <30)

## 2015-08-22 NOTE — Progress Notes (Signed)
ANNUAL PREVENTATIVE VISIT AND CPE  Subjective:  Stephanie Serrano is a 49 y.o. female who presents for a complete physical examination with a pap smear.    She does workout. She denies chest pain, shortness of breath, dizziness.   She is not on cholesterol medication and denies myalgias. Her cholesterol is at goal. The cholesterol last visit was:  No results found for: CHOL, HDL, LDLCALC, LDLDIRECT, TRIG, CHOLHDL   Patient does not have a history of diabetes. She consistently exercises and follows a regular diet. She . She  denies hyperglycemia, increased appetite, nausea, polydipsia, polyuria, visual disturbances, vomiting and weight loss. Last A1C in the office was: No results found for: HGBA1C   Patient is current on Vitamin D supplement.   Lab Results  Component Value Date   VD25OH 38 07/21/2010      Names of Other Physician/Practitioners you currently use: 1. [facility] here for primary care   Eye examination:  Medication Review: Current Outpatient Prescriptions on File Prior to Visit  Medication Sig Dispense Refill  . meclizine (ANTIVERT) 12.5 MG tablet Take 12.5 mg by mouth 3 (three) times daily as needed for dizziness.    [name] omeprazole (PRILOSEC) 20 MG capsule Take 1 capsule (20 mg total) by mouth daily. 30 capsule 3  . acyclovir (ZOVIRAX) 800 MG tablet Take 1 tablet (800 mg total) by mouth 5 (five) times daily. (Patient not taking: Reported on 06/27/2015) 35 tablet 0  . famotidine (PEPCID) 20 MG tablet Take 1 tablet (20 mg total) by mouth daily. (Patient not taking: Reported on 03/21/2015) 30 tablet 3  . omeprazole-sodium bicarbonate (ZEGERID) 40-1100 MG per capsule Take 1 capsule by mouth at bedtime. (Patient not taking: Reported on 02/17/2015) 30 capsule 5  . sertraline (ZOLOFT) 25 MG tablet Take 1 tablet (25 mg total) by mouth daily. (Patient not taking: Reported on 08/22/2015) 30 tablet 2   No current facility-administered medications on file prior to visit.     Current Problems (verified) Patient Active Problem List   Diagnosis Date Noted  . Depression 03/21/2015  . Acute esophagitis 02/09/2015  . Gastroesophageal reflux disease without esophagitis 08/27/2014  . Dysphagia, unspecified(787.20) 03/22/2014    Screening Tests Health Maintenance  Topic Date Due  . HIV Screening  11/02/1981  . PAP SMEAR  11/03/1987  . INFLUENZA VACCINE  07/11/2015  . TETANUS/TDAP  02/16/2025 (Originally 11/02/1985)     There is no immunization history on file for this patient.  Preventative care: Last mammogram: 9 years ago Last pap smear/pelvic exam: 3 years ago   Prior vaccinations: TD or Tdap: Patient had Tdap in 2014   This medication is not covered by your insurance if dispensed from your Physician's office. You can obtain your vaccine at an area Pharmacy.    Medication List       This list is accurate as of: 08/22/15  2:37 PM.  Always use your most recent med list.               acyclovir 800 MG tablet  Commonly known as:  ZOVIRAX  Take 1 tablet (800 mg total) by mouth 5 (five) times daily.     amitriptyline 50 MG tablet  Commonly known as:  ELAVIL  Take 50 mg by mouth at bedtime.     famotidine 20 MG tablet  Commonly known as:  PEPCID  Take 1 tablet (20 mg total) by mouth daily.     meclizine 12.5 MG tablet  Commonly known as:  ANTIVERT  Take 12.5 mg by mouth 3 (three) times daily as needed for dizziness.     omeprazole 20 MG capsule  Commonly known as:  PRILOSEC  Take 1 capsule (20 mg total) by mouth daily.     omeprazole-sodium bicarbonate 40-1100 MG per capsule  Commonly known as:  ZEGERID  Take 1 capsule by mouth at bedtime.     sertraline 25 MG tablet  Commonly known as:  ZOLOFT  Take 1 tablet (25 mg total) by mouth daily.        Past Surgical History  Procedure Laterality Date  . Foot surgery    . Nasal sinus surgery    . Esophagogastroduodenoscopy N/A 02/09/2015    Procedure: ESOPHAGOGASTRODUODENOSCOPY  (EGD);  Surgeon: Inda Castle, MD;  Location: [facility];  Service: Endoscopy;  Laterality: N/A;  with dilation   Family History  Problem Relation Age of Onset  . Pancreatic cancer Father   . Diabetes Paternal Grandmother   . Esophageal cancer Neg Hx   . Stomach cancer Neg Hx   . Diabetes Mother   . Heart disease Mother   . Anxiety disorder Brother     History reviewed: allergies, current medications, past family history, past medical history, past social history, past surgical history and problem list   Tobacco Social History  Substance Use Topics  . Smoking status: Never Smoker   . Smokeless tobacco: Never Used  . Alcohol Use: Yes     Comment: socially   She does not smoke.  Patient is not a former smoker. Are there smokers in your home (other than you)?  No  Alcohol Current alcohol use: none  Caffeine Current caffeine use: denies use  Exercise Current exercise: aerobics  Nutrition/Diet Current diet: in general, a "healthy" diet    Cardiac risk factors: obesity (BMI >= 30 kg/m2).  Depression Screen PHQ 9 reviewed  Vision Difficulties: Last eye examination 3 months ago   Advanced directives Does patient have a [place]? No Does patient have a Living Will? No   Objective:     Blood pressure 138/85, pulse 85, temperature 98.2 F (36.8 C), temperature source Oral, resp. rate 14, height 5\' 2"  (1.575 m), weight 194 lb (87.998 kg), last menstrual period 08/01/2015. Body mass index is 35.47 kg/(m^2).  General appearance: alert, no distress, WD/WN, female HEENT: normocephalic, sclerae anicteric, TMs pearly, nares patent, no discharge or erythema, pharynx normal Oral cavity: MMM, no lesions Neck: supple, no lymphadenopathy, no thyromegaly, no masses Heart: RRR, normal S1, S2, no murmurs Lungs: CTA bilaterally, no wheezes, rhonchi, or rales Abdomen: +bs, soft, non tender, non distended, no masses, no hepatomegaly, no  splenomegaly Musculoskeletal: nontender, no swelling, no obvious deformity. Left heel tender to touch Extremities: no edema, no cyanosis, no clubbing Pulses: 2+ symmetric, upper and lower extremities, normal cap refill Neurological: alert, oriented x 3, CN2-12 intact, strength normal upper extremities and lower extremities, sensation normal throughout, DTRs 2+ throughout, no cerebellar signs, gait normal Vaginal:  Moderate vaginal discharge, rugae within normal limits, no lesions. Uterus anteverted Psychiatric: normal affect, behavior normal, pleasant   Assessment:  Patient denies any difficulties at home. No trouble with ADLs, depression or falls.  No recent changes to vision or hearing.  Is UTD with immunizations.   Encouraged heart healthy diet, exercise as tolerated and adequate sleep. Declines flu shot.  Pap smear done today.    Plan:   1. Annual physical exam - Cytology - PAP [place] - POCT  urinalysis dipstick - CBC with Differential - COMPLETE METABOLIC PANEL WITH GFR - TSH - Hemoglobin A1c - Lipid Panel  2. Need for immunization against influenza  - Flu Vaccine QUAD 36+ mos IM (Fluarix)  3. History of screening mammography  - MM Digital Diagnostic Bilat; Future  During the course of the visit the patient was educated and counseled about appropriate screening and preventive services including:    Influenza vaccine  Diabetes screening  Nutrition counseling   Pap smear   Screening recommendations, referrals: Screening mammogram referral Recommended yearly ophthalmology/optometry checkup Recommended yearly dental visit for hygiene and checkup BMI 35, recommend lowfat, low carbohydrate diet divided over 6 small meals. Increase water intake to 6-8 glasses per day. Exercise 150 minutes per week.   The patient's weight, height, BMI, and visual acuity have been recorded in the chart.  I have made referrals, counseling, and provided education to the patient  based on review of the above and I have provided the patient with a written personalized care plan for preventive services.     Hollis,Lachina Jerilynn Mages, FNP   08/22/2015

## 2015-08-22 NOTE — Patient Instructions (Signed)

## 2015-08-23 ENCOUNTER — Telehealth: Payer: Self-pay | Admitting: Family Medicine

## 2015-08-23 DIAGNOSIS — R7303 Prediabetes: Secondary | ICD-10-CM | POA: Insufficient documentation

## 2015-08-23 DIAGNOSIS — E785 Hyperlipidemia, unspecified: Secondary | ICD-10-CM | POA: Insufficient documentation

## 2015-08-23 LAB — HEMOGLOBIN A1C
HEMOGLOBIN A1C: 5.7 % — AB (ref <5.7)
Mean Plasma Glucose: 117 mg/dL — ABNORMAL HIGH (ref <117)

## 2015-08-23 NOTE — Telephone Encounter (Signed)
Called and advised patient of labs and to start low fat/low carb/low cholesterol diet. Advised to eat 5-6 small meals a day, to increase water to 6 to 8 glasses, and to exercise 150 minutes per week. Patient verbalizes understanding and wants to schedule 6 month appointment when she comes in next week. Thanks!

## 2015-08-23 NOTE — Telephone Encounter (Signed)
Reviewed labs. Hemoglobin A1C is 5.7, which is indicative of prediabetes.  Total cholesterol is 243, calculated Framingham risk score, which is 6.2 %. Will not start statin therapy. Will start a lowfat, low carbohydrate diet over 5-6 small meals, increase water intake to 6-8 glasses per day, exercise 150 minutes of cardiovascular exercise per week.  Follow-up in 6 months for labs.   Dorena Dew, FNP

## 2015-08-24 LAB — CYTOLOGY - PAP

## 2015-08-25 ENCOUNTER — Telehealth: Payer: Self-pay | Admitting: Family Medicine

## 2015-08-25 NOTE — Telephone Encounter (Signed)
Called and left message regarding normal pap smear. Thanks!

## 2015-08-25 NOTE — Telephone Encounter (Signed)
Reviewed pap smear. Negative results. Will repeat pap smear in 3 years.   Dorena Dew, FNP

## 2015-08-31 ENCOUNTER — Encounter: Payer: Self-pay | Admitting: Family Medicine

## 2015-08-31 ENCOUNTER — Ambulatory Visit (INDEPENDENT_AMBULATORY_CARE_PROVIDER_SITE_OTHER): Payer: Medicaid Other | Admitting: Family Medicine

## 2015-08-31 ENCOUNTER — Ambulatory Visit (HOSPITAL_COMMUNITY)
Admission: RE | Admit: 2015-08-31 | Discharge: 2015-08-31 | Disposition: A | Discharge status: Home / Self Care | Payer: Medicaid Other | Source: Ambulatory Visit | Attending: Family Medicine | Admitting: Family Medicine

## 2015-08-31 DIAGNOSIS — Z842 Family history of other diseases of the genitourinary system: Secondary | ICD-10-CM | POA: Insufficient documentation

## 2015-08-31 DIAGNOSIS — M7732 Calcaneal spur, left foot: Secondary | ICD-10-CM | POA: Diagnosis not present

## 2015-08-31 DIAGNOSIS — M79672 Pain in left foot: Secondary | ICD-10-CM

## 2015-08-31 MED ORDER — IBUPROFEN 600 MG PO TABS: 600.0000 mg | ORAL_TABLET | Freq: Three times a day (TID) | ORAL | Status: DC | PRN

## 2015-08-31 NOTE — Progress Notes (Signed)
Subjective:    Patient ID: Stephanie Serrano, female    DOB: [birth year], 49 y.o.   MRN: [id number]    Ms. Tajuanna Burnett, a 49 year old presentscomplaining of left foot pain. She describes pain as piercing, intermittent, and 8/10 in intensity. She states that left heel pain is aggravated by weight bearing exercises, running of treadmill, and eliptical. She states that she has attempted rest, immobilization, and arch support without relief.   Foot Pain This is a recurrent problem. The current episode started more than 1 month ago. The problem occurs intermittently. The problem has been unchanged. Associated symptoms include myalgias (left foot pain). Pertinent negatives include no abdominal pain, chest pain, fatigue, nausea, neck pain, numbness, swollen glands, vertigo, visual change, vomiting or weakness. The symptoms are aggravated by standing and walking. She has tried immobilization and position changes for the symptoms. The treatment provided no relief.    Past Medical History  Diagnosis Date  . RLS (restless legs syndrome)   . Vertigo   . Migraines   . GERD (gastroesophageal reflux disease)   . Esophageal stricture    Social History   Social History  . Marital Status: Divorced    Spouse Name: N/A  . Number of Children: 2  . Years of Education: N/A   Occupational History  . [employer]    Social History Main Topics  . Smoking status: Never Smoker   . Smokeless tobacco: Never Used  . Alcohol Use: Yes     Comment: socially  . Drug Use: No  . Sexual Activity: No   Other Topics Concern  . Not on file   Social History Narrative   Allergies  Allergen Reactions  . Azithromycin   . Clindamycin/Lincomycin   . Penicillins   . Sulfa Antibiotics    Immunization History  Administered Date(s) Administered  . Influenza,inj,Quad PF,36+ Mos 08/22/2015   Review of Systems  Constitutional: Positive for unexpected weight change (weight gain). Negative for fatigue.  Eyes:  Negative.   Respiratory: Negative.   Cardiovascular: Negative.  Negative for chest pain.  Gastrointestinal: Negative.  Negative for nausea, vomiting and abdominal pain.  Endocrine: Negative for polydipsia, polyphagia and polyuria.  Genitourinary: Negative.  Negative for dysuria, hematuria and difficulty urinating.  Musculoskeletal: Positive for myalgias (left foot pain). Negative for neck pain.  Skin: Negative.   Allergic/Immunologic: Negative for immunocompromised state.  Neurological: Negative.  Negative for vertigo, weakness and numbness.  Hematological: Negative.   Psychiatric/Behavioral: Negative.        Objective:   Physical Exam  Constitutional: She is oriented to person, place, and time. She appears well-developed and well-nourished.  HENT:  Head: Normocephalic and atraumatic.  Right Ear: External ear normal.  Left Ear: External ear normal.  Mouth/Throat: Oropharynx is clear and moist.  Eyes: Conjunctivae and EOM are normal. Pupils are equal, round, and reactive to light. No scleral icterus.  Neck: Normal range of motion. Neck supple.  Cardiovascular: Normal rate, regular rhythm, normal heart sounds and intact distal pulses.   Pulmonary/Chest: Effort normal and breath sounds normal.  Abdominal: Soft. Bowel sounds are normal.  Musculoskeletal: Normal range of motion.  Pain primarily to left heel. Pain intensified by dorsiflexion and inversion  Neurological: She is alert and oriented to person, place, and time. She has normal reflexes.  Skin: Skin is warm and dry.  Psychiatric: She has a normal mood and affect. Her behavior is normal. Judgment and thought content normal.  Nursing note and vitals reviewed.  BP 121/79 mmHg  Pulse 77  Temp(Src) 98.4 F (36.9 C) (Oral)  Resp 16  Ht 5\' 2"  (1.575 m)  Wt 192 lb (87.091 kg)  BMI 35.11 kg/m2  LMP 08/01/2015 Assessment & Plan:  1. Left foot pain Patient states that she has been running on treadmill and doing to  eliptical several times per week. Recommend that patient refrain from running on treadmill. Will obtain x-ray to rule out a stress fracture.  -Recommend arch support while at work -Recommend that patient decrease weight by following a lowfat, low carbohydrate diet divided over 5-6 small meals and increased water intake -Rest left foot, elevate extremity to heart level -Ice massage 15-20 minutes 4 times daily -Avoid flat shoes or walking without shoes -Will notify patient with x-ray results  - DG Foot Complete Left; Future - ibuprofen (ADVIL,MOTRIN) 600 MG tablet; Take 1 tablet (600 mg total) by mouth every 8 (eight) hours as needed.  Dispense: 30 tablet; Refill: 0     Hollis,Lachina M, FNP  The patient was given clear instructions to go to ER or return to medical center if symptoms do not improve, worsen or new problems develop. The patient verbalized understanding. Will notify patient with laboratory results.

## 2015-08-31 NOTE — Patient Instructions (Signed)
-  Recommend arch support while at work -Recommend that patient decrease weight by following a lowfat, low carbohydrate diet divided over 5-6 small meals and increased water intake -Rest left foot, elevate extremity to heart level -Ice massage 15-20 minutes 4 times daily -Avoid flat shoes or walking without shoes -Will notify patient with x-ray results

## 2015-09-01 ENCOUNTER — Telehealth: Payer: Self-pay | Admitting: Family Medicine

## 2015-09-01 DIAGNOSIS — M7732 Calcaneal spur, left foot: Secondary | ICD-10-CM

## 2015-09-01 DIAGNOSIS — M7730 Calcaneal spur, unspecified foot: Secondary | ICD-10-CM | POA: Insufficient documentation

## 2015-09-01 NOTE — Telephone Encounter (Signed)
Notified patient to discuss left foot xray results. Patient has a calcaneal heel spur.  Recommend arch support while at work -Recommend that patient decrease weight by following a lowfat, low carbohydrate diet divided over 5-6 small meals and increased water intake -Rest left foot, elevate extremity to heart level -Ice massage 15-20 minutes 4 times daily -Avoid flat shoes or walking without shoes -Will send referral to physical therapy   Dorena Dew, FNP

## 2015-09-05 ENCOUNTER — Other Ambulatory Visit: Payer: Self-pay

## 2015-09-05 ENCOUNTER — Encounter: Payer: Self-pay | Admitting: Family Medicine

## 2015-09-05 ENCOUNTER — Ambulatory Visit (INDEPENDENT_AMBULATORY_CARE_PROVIDER_SITE_OTHER): Payer: Medicaid Other | Admitting: Family Medicine

## 2015-09-05 VITALS — BP 135/90 | HR 100 | Temp 98.7°F | Resp 16 | Ht 62.0 in | Wt 190.0 lb

## 2015-09-05 DIAGNOSIS — J0110 Acute frontal sinusitis, unspecified: Secondary | ICD-10-CM

## 2015-09-05 DIAGNOSIS — R519 Headache, unspecified: Secondary | ICD-10-CM

## 2015-09-05 DIAGNOSIS — R51 Headache: Secondary | ICD-10-CM | POA: Diagnosis not present

## 2015-09-05 DIAGNOSIS — R0981 Nasal congestion: Secondary | ICD-10-CM

## 2015-09-05 MED ORDER — CHLORPHEN-PE-ACETAMINOPHEN 4-10-325 MG PO TABS: 1.0000 | ORAL_TABLET | Freq: Four times a day (QID) | ORAL | Status: DC | PRN

## 2015-09-05 MED ORDER — LEVOFLOXACIN 500 MG PO TABS: 500.0000 mg | ORAL_TABLET | Freq: Every day | ORAL | Status: DC

## 2015-09-05 MED ORDER — CHLORPHEN-PE-ACETAMINOPHEN 4-10-325 MG PO TABS
1.0000 | ORAL_TABLET | Freq: Four times a day (QID) | ORAL | Status: DC | PRN
Start: 2015-09-05 — End: 2015-09-05

## 2015-09-05 NOTE — Patient Instructions (Addendum)
Recommend increase rest and handwashing.  Increase vitamin C intake Increase water intake Utilize over the counter nasal saline twice daily Sinusitis Sinusitis is redness, soreness, and inflammation of the paranasal sinuses. Paranasal sinuses are air pockets within the bones of your face (beneath the eyes, the middle of the forehead, or above the eyes). In healthy paranasal sinuses, mucus is able to drain out, and air is able to circulate through them by way of your nose. However, when your paranasal sinuses are inflamed, mucus and air can become trapped. This can allow bacteria and other germs to grow and cause infection. Sinusitis can develop quickly and last only a short time (acute) or continue over a long period (chronic). Sinusitis that lasts for more than 12 weeks is considered chronic.  CAUSES  Causes of sinusitis include:  Allergies.  Structural abnormalities, such as displacement of the cartilage that separates your nostrils (deviated septum), which can decrease the air flow through your nose and sinuses and affect sinus drainage.  Functional abnormalities, such as when the small hairs (cilia) that line your sinuses and help remove mucus do not work properly or are not present. SIGNS AND SYMPTOMS  Symptoms of acute and chronic sinusitis are the same. The primary symptoms are pain and pressure around the affected sinuses. Other symptoms include:  Upper toothache.  Earache.  Headache.  Bad breath.  Decreased sense of smell and taste.  A cough, which worsens when you are lying flat.  Fatigue.  Fever.  Thick drainage from your nose, which often is green and may contain pus (purulent).  Swelling and warmth over the affected sinuses. DIAGNOSIS  Your health care provider will perform a physical exam. During the exam, your health care provider may:  Look in your nose for signs of abnormal growths in your nostrils (nasal polyps).  Tap over the affected sinus to check for  signs of infection.  View the inside of your sinuses (endoscopy) using an imaging device that has a light attached (endoscope). If your health care provider suspects that you have chronic sinusitis, one or more of the following tests may be recommended:  Allergy tests.  Nasal culture. A sample of mucus is taken from your nose, sent to a lab, and screened for bacteria.  Nasal cytology. A sample of mucus is taken from your nose and examined by your health care provider to determine if your sinusitis is related to an allergy. TREATMENT  Most cases of acute sinusitis are related to a viral infection and will resolve on their own within 10 days. Sometimes medicines are prescribed to help relieve symptoms (pain medicine, decongestants, nasal steroid sprays, or saline sprays).  However, for sinusitis related to a bacterial infection, your health care provider will prescribe antibiotic medicines. These are medicines that will help kill the bacteria causing the infection.  Rarely, sinusitis is caused by a fungal infection. In theses cases, your health care provider will prescribe antifungal medicine. For some cases of chronic sinusitis, surgery is needed. Generally, these are cases in which sinusitis recurs more than 3 times per year, despite other treatments. HOME CARE INSTRUCTIONS   Drink plenty of water. Water helps thin the mucus so your sinuses can drain more easily.  Use a humidifier.  Inhale steam 3 to 4 times a day (for example, sit in the bathroom with the shower running).  Apply a warm, moist washcloth to your face 3 to 4 times a day, or as directed by your health care provider.  Use saline  nasal sprays to help moisten and clean your sinuses.  Take medicines only as directed by your health care provider.  If you were prescribed either an antibiotic or antifungal medicine, finish it all even if you start to feel better. SEEK IMMEDIATE MEDICAL CARE IF:  You have increasing pain or  severe headaches.  You have nausea, vomiting, or drowsiness.  You have swelling around your face.  You have vision problems.  You have a stiff neck.  You have difficulty breathing. MAKE SURE YOU:   Understand these instructions.  Will watch your condition.  Will get help right away if you are not doing well or get worse. Document Released: 11/26/2005 Document Revised: 04/12/2014 Document Reviewed: 12/11/2011 [facility] Patient Information 2015 [place], [place]. This information is not intended to replace advice given to you by your health care provider. Make sure you discuss any questions you have with your health care provider.

## 2015-09-05 NOTE — Progress Notes (Signed)
Subjective:    Patient ID: Stephanie Serrano, female    DOB: 11/14/1966, 49 y.o.   MRN: [id number]  Sinusitis This is a new problem. The current episode started 1 to 4 weeks ago. The problem has been rapidly worsening since onset. There has been no fever. Her pain is at a severity of 4/10. The pain is moderate. Associated symptoms include congestion, ear pain, headaches, sinus pressure and swollen glands. Pertinent negatives include no chills, coughing, neck pain, shortness of breath, sneezing or sore throat. Past treatments include acetaminophen. The treatment provided no relief.   Past Medical History  Diagnosis Date  . RLS (restless legs syndrome)   . Vertigo   . Migraines   . GERD (gastroesophageal reflux disease)   . Esophageal stricture    Immunization History  Administered Date(s) Administered  . Influenza,inj,Quad PF,36+ Mos 08/22/2015   Allergies  Allergen Reactions  . Azithromycin   . Clindamycin/Lincomycin   . Penicillins   . Sulfa Antibiotics     Review of Systems  Constitutional: Positive for fatigue. Negative for chills.  HENT: Positive for congestion, ear discharge, ear pain, postnasal drip and sinus pressure. Negative for sneezing and sore throat.   Eyes: Positive for redness.  Respiratory: Negative.  Negative for cough and shortness of breath.   Cardiovascular: Negative.   Gastrointestinal: Negative for diarrhea.  Endocrine: Negative.   Genitourinary: Negative.   Musculoskeletal: Negative.  Negative for neck pain.  Skin: Negative.   Allergic/Immunologic: Negative.   Neurological: Positive for headaches.  Hematological: Negative.   Psychiatric/Behavioral: Negative.        Objective:   Physical Exam  Constitutional: She is oriented to person, place, and time. She appears well-developed and well-nourished. She has a sickly appearance. No distress.  HENT:  Right Ear: Tympanic membrane is erythematous. No decreased hearing is noted.  Left Ear: Tympanic  membrane is erythematous. No decreased hearing is noted.  Nose: Mucosal edema and sinus tenderness present. Right sinus exhibits maxillary sinus tenderness and frontal sinus tenderness. Left sinus exhibits maxillary sinus tenderness and frontal sinus tenderness.  Mouth/Throat: Posterior oropharyngeal erythema present.  Eyes: Conjunctivae and EOM are normal. Pupils are equal, round, and reactive to light.  Neck: Normal range of motion. Neck supple.  Cardiovascular: Normal rate, regular rhythm, normal heart sounds and intact distal pulses.   Pulmonary/Chest: Effort normal and breath sounds normal. No respiratory distress.  Abdominal: Soft. Bowel sounds are normal.  Musculoskeletal: Normal range of motion.  Lymphadenopathy:       Head (right side): No submental and no submandibular adenopathy present.       Head (left side): Posterior auricular adenopathy present. No submental and no submandibular adenopathy present.  Neurological: She is alert and oriented to person, place, and time.  Skin: Skin is warm and dry.  Psychiatric: She has a normal mood and affect. Her behavior is normal. Judgment normal.        BP 135/90 mmHg  Pulse 100  Temp(Src) 98.7 F (37.1 C) (Oral)  Resp 16  Ht 5\' 2"  (1.575 m)  Wt 190 lb (86.183 kg)  BMI 34.74 kg/m2  LMP 08/01/2015 Assessment & Plan:  1. Acute frontal sinusitis, recurrence not specified Recommend increase rest and handwashing.  Increase vitamin C intake Increase water intake Utilize over the counter nasal saline twice daily as needed S - levofloxacin (LEVAQUIN) 500 MG tablet; Take 1 tablet (500 mg total) by mouth daily.  Dispense: 7 tablet; Refill: 0 - Chlorphen-PE-Acetaminophen (NOREL AD) 4-10-325 MG TABS;  Take 1 tablet by mouth every 6 (six) hours as needed.  Dispense: 20 tablet; Refill: 0  2. Nasal congestion  - Chlorphen-PE-Acetaminophen (NOREL AD) 4-10-325 MG TABS; Take 1 tablet by mouth every 6 (six) hours as needed.  Dispense: 20 tablet;  Refill: 0  3. Sinus headache  - Chlorphen-PE-Acetaminophen (NOREL AD) 4-10-325 MG TABS; Take 1 tablet by mouth every 6 (six) hours as needed.  Dispense: 20 tablet; Refill: 0   RTC: PRN  Hollis,Lachina M, FNP   The patient was given clear instructions to go to ER or return to medical center if symptoms do not improve, worsen or new problems develop. The patient verbalized understanding. Will notify patient with laboratory results.

## 2015-09-05 NOTE — Telephone Encounter (Signed)
Re-sent rx to correct pharmacy. Thanks!

## 2015-09-14 ENCOUNTER — Other Ambulatory Visit: Payer: Self-pay | Admitting: Family Medicine

## 2015-09-14 DIAGNOSIS — M7732 Calcaneal spur, left foot: Secondary | ICD-10-CM

## 2015-09-22 ENCOUNTER — Encounter: Payer: Self-pay | Admitting: Podiatry

## 2015-09-22 ENCOUNTER — Ambulatory Visit (INDEPENDENT_AMBULATORY_CARE_PROVIDER_SITE_OTHER): Payer: Medicaid Other | Admitting: Podiatry

## 2015-09-22 ENCOUNTER — Ambulatory Visit (INDEPENDENT_AMBULATORY_CARE_PROVIDER_SITE_OTHER): Payer: Medicaid Other

## 2015-09-22 VITALS — BP 135/73 | HR 87 | Resp 16

## 2015-09-22 DIAGNOSIS — M722 Plantar fascial fibromatosis: Secondary | ICD-10-CM | POA: Diagnosis not present

## 2015-09-22 DIAGNOSIS — M216X9 Other acquired deformities of unspecified foot: Secondary | ICD-10-CM

## 2015-09-22 DIAGNOSIS — M79672 Pain in left foot: Secondary | ICD-10-CM

## 2015-09-22 DIAGNOSIS — M21619 Bunion of unspecified foot: Secondary | ICD-10-CM

## 2015-09-22 NOTE — Patient Instructions (Signed)

## 2015-09-23 NOTE — Progress Notes (Signed)
Subjective:     Patient ID: Stephanie Serrano, female   DOB: 04-03-1966, 49 y.o.   MRN: [id number]  HPI patient presents stating I am having a lot of pain in the bottom of my left heel and it seems to be going on for a long time and I had my right and I might end up needing surgery on this   Review of Systems  All other systems reviewed and are negative.      Objective:   Physical Exam  Constitutional: She is oriented to person, place, and time.  Cardiovascular: Intact distal pulses.   Musculoskeletal: Normal range of motion.  Neurological: She is oriented to person, place, and time.  Skin: Skin is warm.  Nursing note and vitals reviewed.  neurovascular status intact muscle strength adequate range of motion within normal limits with patient noted to have pain in the plantar aspect of the left heel at the insertional point of the tendon into the calcaneus. Patient states that it's impossible for her to be active and she's been trying to control her weight but is not been able to due to not been able to exercise. Has good digital perfusion and is well oriented 3     Assessment:     Plantar fasciitis left with inflammation and fluid around the medial band    Plan:     H&P condition reviewed with patient and discussed this condition. At this time I injected the plantar fascia 3 mg Kenalog 5 mg Xylocaine and instructed on physical therapy and dispensed fascial brace. If symptoms not improving we will need to consider surgical intervention in this particular case due to long-standing nature and her current work situation

## 2015-09-28 ENCOUNTER — Ambulatory Visit (INDEPENDENT_AMBULATORY_CARE_PROVIDER_SITE_OTHER): Payer: Medicaid Other | Admitting: Podiatry

## 2015-09-28 ENCOUNTER — Encounter: Payer: Self-pay | Admitting: Podiatry

## 2015-09-28 DIAGNOSIS — M722 Plantar fascial fibromatosis: Secondary | ICD-10-CM

## 2015-09-28 MED ORDER — TRIAMCINOLONE ACETONIDE 10 MG/ML IJ SUSP
10.0000 mg | Freq: Once | INTRAMUSCULAR | Status: AC
  Administered 2015-09-28: 10 mg

## 2015-09-29 ENCOUNTER — Telehealth: Payer: Self-pay

## 2015-09-29 NOTE — Telephone Encounter (Signed)
Spoke with pt, she states that her left heel is still in severe pain. States that it woke her up last night, she has tried cold and hot therapies, nsaids but is not getting any relief. She wants an appt to discuss further options, she has received 2 kenalog injections but the heel pain in her left foot remains. Transferred to schedulers to make appt

## 2015-10-03 ENCOUNTER — Ambulatory Visit (INDEPENDENT_AMBULATORY_CARE_PROVIDER_SITE_OTHER): Payer: Medicaid Other | Admitting: Podiatry

## 2015-10-03 ENCOUNTER — Encounter: Payer: Self-pay | Admitting: Podiatry

## 2015-10-03 VITALS — BP 135/80 | HR 80 | Resp 12

## 2015-10-03 DIAGNOSIS — M722 Plantar fascial fibromatosis: Secondary | ICD-10-CM

## 2015-10-03 NOTE — Progress Notes (Signed)
Subjective:     Patient ID: Stephanie Serrano, female   DOB: Jun 18, 1966, 49 y.o.   MRN: [id number]  HPI patient states my left foot is improved but still painful in the heel with ambulation   Review of Systems     Objective:   Physical Exam Neurovascular status intact muscle strength adequate with discomfort plantar heel left still present but improved from previous visits    Assessment:     Plantar fasciitis left with inflammation fluid buildup    Plan:     Advised on continued physical therapy supportive shoe gear usage and injected the plantar fascia 3 [place] Kenalog 5 mill grams Xylocaine to reduce stress. Reappoint if symptoms persist or get worse

## 2015-10-03 NOTE — Patient Instructions (Signed)
Pre-Operative Instructions  Congratulations, you have decided to take an important step to improving your quality of life.  You can be assured that the doctors of [facility] will be with you every step of the way.  1. Plan to be at the surgery center/hospital at least 1 (one) hour prior to your scheduled time unless otherwise directed by the surgical center/hospital staff.  You must have a responsible adult accompany you, remain during the surgery and drive you home.  Make sure you have directions to the surgical center/hospital and know how to get there on time. 2. For hospital based surgery you will need to obtain a history and physical form from your family physician within 1 month prior to the date of surgery- we will give you a form for you primary physician.  3. We make every effort to accommodate the date you request for surgery.  There are however, times where surgery dates or times have to be moved.  We will contact you as soon as possible if a change in schedule is required.   4. No Aspirin/Ibuprofen for one week before surgery.  If you are on aspirin, any non-steroidal anti-inflammatory medications (Mobic, Aleve, Ibuprofen) you should stop taking it 7 days prior to your surgery.  You make take Tylenol  For pain prior to surgery.  5. Medications- If you are taking daily heart and blood pressure medications, seizure, reflux, allergy, asthma, anxiety, pain or diabetes medications, make sure the surgery center/hospital is aware before the day of surgery so they may notify you which medications to take or avoid the day of surgery. 6. No food or drink after midnight the night before surgery unless directed otherwise by surgical center/hospital staff. 7. No alcoholic beverages 24 hours prior to surgery.  No smoking 24 hours prior to or 24 hours after surgery. 8. Wear loose pants or shorts- loose enough to fit over bandages, boots, and casts. 9. No slip on shoes, sneakers are best. 10. Bring  your boot with you to the surgery center/hospital.  Also bring crutches or a walker if your physician has prescribed it for you.  If you do not have this equipment, it will be provided for you after surgery. 11. If you have not been contracted by the surgery center/hospital by the day before your surgery, call to confirm the date and time of your surgery. 12. Leave-time from work may vary depending on the type of surgery you have.  Appropriate arrangements should be made prior to surgery with your employer. 13. Prescriptions will be provided immediately following surgery by your doctor.  Have these filled as soon as possible after surgery and take the medication as directed. 14. Remove nail polish on the operative foot. 15. Wash the night before surgery.  The night before surgery wash the foot and leg well with the antibacterial soap provided and water paying special attention to beneath the toenails and in between the toes.  Rinse thoroughly with water and dry well with a towel.  Perform this wash unless told not to do so by your physician.  Enclosed: 1 Ice pack (please put in freezer the night before surgery)   1 Hibiclens skin cleaner   Pre-op Instructions  If you have any questions regarding the instructions, do not hesitate to call our office.  [place]: [address] [place], [place] [address] [phone]  [place]: [address] [place], [place] [address] [phone]  [place]: [address]  [place], [place] [address] [phone]  Dr. Richard   Tuchman DPM, Dr. Norman Regal DPM Dr. Richard Sikora DPM, Dr. M. Todd Hyatt DPM, Dr. Kathryn Egerton DPM 

## 2015-10-04 ENCOUNTER — Encounter: Payer: Self-pay | Admitting: Podiatry

## 2015-10-04 ENCOUNTER — Telehealth: Payer: Self-pay

## 2015-10-04 ENCOUNTER — Telehealth: Payer: Self-pay | Admitting: *Deleted

## 2015-10-04 ENCOUNTER — Other Ambulatory Visit: Payer: Self-pay

## 2015-10-04 DIAGNOSIS — M722 Plantar fascial fibromatosis: Secondary | ICD-10-CM | POA: Diagnosis not present

## 2015-10-04 MED ORDER — MEPERIDINE HCL 50 MG PO TABS: 50.0000 mg | ORAL_TABLET | ORAL | Status: DC | PRN

## 2015-10-04 NOTE — Telephone Encounter (Signed)
Pt wanted Rx changed from percocet 10/325mg  #35  to Demerol 50mg  #30. Advised pt to bring back Rx for Percocet and we would write her a Rx for Demerol. She stated that her friend had already filled her Rx for Percocet. I then advised her to bring the medication in and she could get the Rx for Demerol. Pt friend dropped off #34 Percocet 10/325mg  pills. Demerol prescription was given.

## 2015-10-04 NOTE — Telephone Encounter (Signed)
"  I called the surgical center as you suggested and the nurse told me that Dr. Paulla Dolly said he would prescribe Demerol.  I will have my messenger to pick that up.  They told me to keep it elevated in the mean time.  I just wanted to let you know what was going on.  Call and let me know the status."

## 2015-10-04 NOTE — Telephone Encounter (Signed)
"  I just had surgery at the surgical center.  Dr. Paulla Dolly prescribed Percocet.  It has the tendency to make me nauseous.  Can he prescribe something else?"  Try to contact him at the surgical center.  "Can you give me their number?"  It is [phone].  "Okay, thank you."

## 2015-10-04 NOTE — Telephone Encounter (Signed)
I'm calling to see if you got your prescription.  "Yes, my friend just picked it up from there."  Okay, I just wanted to make sure you were okay for the night.  "I know right, that's what I was afraid of.  Thank you for calling."

## 2015-10-04 NOTE — Telephone Encounter (Signed)
She can have demerol 50 # 30 and phenergan 25mg . She needs to bring in her other prescription

## 2015-10-05 NOTE — Progress Notes (Signed)
Subjective:     Patient ID: Stephanie Serrano, female   DOB: 01-01-1966, 49 y.o.   MRN: [id number]  HPI patient states my heel is killing me and I cannot walk comfortably and I would like to get it fixed due to my work schedule being available at this time. States the problem has been present for well over a year and that she's tried different shoe gear insoles she's tried reducing her activity and she's tried other modalities for relief without any success   Review of Systems     Objective:   Physical Exam Neurovascular status was found to be intact digital flow was found to be good 1-5 bilateral and patient's noted to have exquisite tenderness plantar aspect left heel with moderate tenderness plantar aspect right heel    Assessment:     Plantar fasciitis left over right with inflammation and fluid buildup    Plan:     H&P and conditions reviewed with patient. Today we discussed the condition at great length and treatment options. She wants to get the heel fixed and I reviewed different treatment options available. We are going to do endoscopic release of the medial band and I allowed her to read consent form reviewing alternative treatments and complications associated with surgery and she understands risk and is willing to accept risk and understands recovery can be upwards of 6 months to one year for this procedure with the possibility for long-term arch lateral foot or dorsal foot symptoms. Patient understands all of this and signs consent form and is given all preoperative instructions for surgery and again due to her schedule we are able to get her in within 24 hours for procedure. I will also inject the right heel to try to help reduce the inflammation present at the time of the surgery on the left heel

## 2015-10-14 ENCOUNTER — Ambulatory Visit (INDEPENDENT_AMBULATORY_CARE_PROVIDER_SITE_OTHER): Payer: Medicaid Other | Admitting: Podiatry

## 2015-10-14 DIAGNOSIS — M722 Plantar fascial fibromatosis: Secondary | ICD-10-CM | POA: Diagnosis not present

## 2015-10-14 DIAGNOSIS — Z9889 Other specified postprocedural states: Secondary | ICD-10-CM

## 2015-10-14 MED ORDER — ETODOLAC 200 MG PO CAPS: 200.0000 mg | ORAL_CAPSULE | Freq: Three times a day (TID) | ORAL | Status: DC

## 2015-10-17 ENCOUNTER — Telehealth: Payer: Self-pay | Admitting: *Deleted

## 2015-10-17 MED ORDER — ETODOLAC ER 400 MG PO TB24: ORAL_TABLET | ORAL | Status: DC

## 2015-10-17 MED ORDER — DICLOFENAC SODIUM 75 MG PO TBEC: 75.0000 mg | DELAYED_RELEASE_TABLET | Freq: Two times a day (BID) | ORAL | Status: DC

## 2015-10-17 NOTE — Telephone Encounter (Signed)
Pt states she has used Etodolac 400mg  once in the morning and 400mg  once in the evening from [facility] office and it helps and has never had a problem, like with Naprosyn.  Dr. Jacqualyn Posey states Etodolac 400mg  one tablet bid with food #30 no refills.

## 2015-10-20 NOTE — Progress Notes (Signed)
Patient ID: Stephanie Serrano, female   DOB: [birth year], 49 y.o.   MRN: [id number]  Subjective: Stephanie Serrano is a 49 y.o. fe/female patient seen today in office s/p left EPF preformed on 10/03/48 with Dr. Paulla Dolly. They state their pain is intermittent. She has been continuing with immobilization in the boot. Denies any systemic complaints such as fevers, chills, nausea, vomiting. No calf pain, chest pain, shortness of breath.   Objective: General: No acute distress, AAOx3  DP/PT pulses palpable 2/4, CRT < 3 sec to all digits.  Protective sensation intact. Motor function intact.  Left foot: Incision is well coapted without any evidence of dehiscence and sutures intact. There is no surrounding erythema, ascending cellulitis, fluctuance, crepitus, malodor, drainage/purulence. There is miimal edema around the surgical site. There is slgiht pain along the surgical site. No other areas of tenderness to bilateral lower extremities. No other open lesions or pre-ulcerative lesions. No pain with calf compression, swelling, warmth, erythema.   Assessment and Plan:  Status post, Left EPF, doing well with no complications   -Treatment options discussed including all alternatives, risks, and complications -Antibiotic ointment was placed over the incision followed by dry sterile dressing. -Ice/elevation -Pain medication as needed. -Monitor for any clinical signs or symptoms of infection and DVT/PE and directed to call the office immediately should any occur or go to the ER. -Follow-up 10 days for suture removal or sooner if any problems arise. In the meantime, encouraged to call the office with any questions, concerns, change in symptoms.   Celesta Gentile, DPM

## 2015-10-27 ENCOUNTER — Ambulatory Visit (INDEPENDENT_AMBULATORY_CARE_PROVIDER_SITE_OTHER): Payer: Medicaid Other | Admitting: Podiatry

## 2015-10-27 ENCOUNTER — Encounter: Payer: Self-pay | Admitting: Podiatry

## 2015-10-27 DIAGNOSIS — Z9889 Other specified postprocedural states: Secondary | ICD-10-CM

## 2015-10-27 DIAGNOSIS — M722 Plantar fascial fibromatosis: Secondary | ICD-10-CM | POA: Diagnosis not present

## 2015-10-27 MED ORDER — PROMETHAZINE HCL 25 MG PO TABS
25.0000 mg | ORAL_TABLET | Freq: Three times a day (TID) | ORAL | Status: DC | PRN
Start: 2015-10-27 — End: 2016-02-08

## 2015-10-27 MED ORDER — ALPRAZOLAM 0.5 MG PO TABS: 0.5000 mg | ORAL_TABLET | Freq: Two times a day (BID) | ORAL | Status: DC | PRN

## 2015-10-27 MED ORDER — MEPERIDINE HCL 50 MG PO TABS: 50.0000 mg | ORAL_TABLET | ORAL | Status: DC | PRN

## 2015-10-27 NOTE — Progress Notes (Signed)
Subjective:     Patient ID: Stephanie Serrano, female   DOB: 26-Sep-1966, 49 y.o.   MRN: [id number]  HPI patient states my heel still seems sore and I have been very anxious about this   Review of Systems     Objective:   Physical Exam Neurovascular status is intact negative Homans sign was noted wound edges are coapted well with moderate plantar pain but no excessive edema    Assessment:     Patient does seem slightly apprehensive about her condition but based on clinical findings it appears to be healing well with no long-term problems    Plan:     Stitches removed wound edges coapted well dressings applied and sterile dressing applied to the foot. I gave advice on gradual increase in weightbearing and I did write her for Demerol and Phenergan as a lower level pain medicine and also Xanax to try to help with the stress that she experiences

## 2015-10-28 ENCOUNTER — Telehealth: Payer: Self-pay | Admitting: *Deleted

## 2015-10-28 ENCOUNTER — Encounter: Payer: Self-pay | Admitting: Podiatry

## 2015-10-28 NOTE — Telephone Encounter (Signed)
Pt states she thought she was to get a compression sock after her last visit.  Pt states she is wearing only the ace and the boot, and was told she could shower.  I told pt to continue with the ace the rest of the week, and begin the compression sock Monday, but to shower every night then put the compression sock on in the morning before swinging the legs over the edge of the bed, to wear it everyday and take it off at night to bathe and sleep. Pt states understanding and will pick up the compression sock today.

## 2015-11-11 NOTE — Progress Notes (Signed)
Patient ID: Stephanie Serrano, female   DOB: [birth year], 49 y.o.   MRN: [id number] Dr Paulla Dolly performed a left foot plantar fascial release on 10/25/016 at [facility]

## 2015-11-15 ENCOUNTER — Telehealth: Payer: Self-pay | Admitting: *Deleted

## 2015-11-15 NOTE — Telephone Encounter (Signed)
Pt states her plantar fascia is better after the surgery, but her heel hurts.  I spoke with pt about changing shoes to more padded shoes like athletic or even new shoes, pt states her shoes are new.  I told her she may have something going on different and transferred to scheduler for earlier appt.

## 2015-11-18 ENCOUNTER — Encounter: Payer: Self-pay | Admitting: Podiatry

## 2015-11-18 ENCOUNTER — Ambulatory Visit (INDEPENDENT_AMBULATORY_CARE_PROVIDER_SITE_OTHER): Payer: Medicaid Other | Admitting: Podiatry

## 2015-11-18 ENCOUNTER — Ambulatory Visit (INDEPENDENT_AMBULATORY_CARE_PROVIDER_SITE_OTHER): Payer: Medicaid Other

## 2015-11-18 DIAGNOSIS — S93602A Unspecified sprain of left foot, initial encounter: Secondary | ICD-10-CM

## 2015-11-18 DIAGNOSIS — M7750 Other enthesopathy of unspecified foot: Secondary | ICD-10-CM | POA: Diagnosis not present

## 2015-11-18 DIAGNOSIS — M779 Enthesopathy, unspecified: Secondary | ICD-10-CM

## 2015-11-18 MED ORDER — HYDROCODONE-ACETAMINOPHEN 10-325 MG PO TABS: 1.0000 | ORAL_TABLET | Freq: Three times a day (TID) | ORAL | Status: DC | PRN

## 2015-11-18 MED ORDER — TRIAMCINOLONE ACETONIDE 10 MG/ML IJ SUSP
10.0000 mg | Freq: Once | INTRAMUSCULAR | Status: AC
  Administered 2015-11-18: 10 mg

## 2015-11-20 NOTE — Progress Notes (Signed)
Subjective:     Patient ID: Stephanie Serrano, female   DOB: Apr 26, 1966, 49 y.o.   MRN: [id number]  HPI patient presents stating I'm having some swelling and still having discomfort in my ankle   Review of Systems     Objective:   Physical Exam Neurovascular status unchanged with patient having negative Homans sign and mild edema in the left foot with quite a bit of discomfort in the left sinus tarsi noted with no restriction currently of ankle or subtalar joint motion    Assessment:     Inflammatory capsulitis with edema occurring localized in nature    Plan:     Reviewed condition and applied Unna boot to try to reduce edema after injecting sinus tarsi 3 mg Kenalog 5 mg Xylocaine

## 2015-11-24 ENCOUNTER — Other Ambulatory Visit: Payer: Medicaid Other

## 2015-11-29 ENCOUNTER — Telehealth: Payer: Self-pay | Admitting: *Deleted

## 2015-11-29 NOTE — Telephone Encounter (Signed)
Pt states she is no better and can't wear most shoes, has an appt 12/08/2015.  I told pt to back off on as much excess activity as possible, and I would have scheduler call tomorrow to schedule in as soon as possible.

## 2015-11-30 ENCOUNTER — Encounter: Payer: Self-pay | Admitting: Podiatry

## 2015-11-30 ENCOUNTER — Ambulatory Visit (INDEPENDENT_AMBULATORY_CARE_PROVIDER_SITE_OTHER): Payer: Medicaid Other | Admitting: Podiatry

## 2015-11-30 VITALS — BP 109/71 | HR 80 | Resp 12

## 2015-11-30 DIAGNOSIS — M722 Plantar fascial fibromatosis: Secondary | ICD-10-CM

## 2015-11-30 MED ORDER — PREDNISONE 10 MG (21) PO TBPK
ORAL_TABLET | ORAL | Status: DC
Start: 2015-11-30 — End: 2016-02-08

## 2015-11-30 NOTE — Patient Instructions (Signed)
Today your symptoms are consistent with plantar fasciitis in the left foot. Often these symptoms will persist up to 12 months after surgery Today we applied a tape dressing to the left foot and if possible leave on up to 5 days and keep dry Also, prescribed a 10 mg prednisone tapering Dosepak

## 2015-11-30 NOTE — Progress Notes (Signed)
   Subjective:    Patient ID: Stephanie Serrano, female    DOB: 01/03/1966, 49 y.o.   MRN: [id number]  HPI  This patient presents again today complaining of pain in the plantar fascial area of the left foot. The pain is been very uncomfortable increasing with activity relieved somewhat with rest. She is undergone a plantar fasciotomy in October 2016 by Dr. Paulla Dolly. Since then she's had continuous ongoing discomfort which is restricted the amount of walking and standing. Since the procedure she's also had an Scientist, research (life sciences) and a Kenalog Injection into the sinus tarsi area on the visit of 11/18/2015.  Review of Systems  Musculoskeletal: Positive for joint swelling and gait problem.       Objective:   Physical Exam  Pleasant orientated 3 patient presents with 2 daughters in the treatment room  Vascular: No peripheral edema noted bilaterally DP and PT pulses 2/4 bilaterally Capillary reflex immediate bilaterally  Neurological: Sensation to 10 g monofilament wire intact 5/5 bilaterally Vibratory sensation reactive bilaterally Ankle reflex equal and reactive bilaterally  Dermatological: Texture and turgor within normal limits bilaterally Well-healed surgical scars dorsal medial first MPJ bilaterally Well-healed surgical scars medial lateral left heel  Musculoskeletal: Palpable tenderness in the medial plantar and central fascial insertional area left without any palpable lesions Palpation of the medial fascial band elicits no tenderness There is no tenderness or palpable lesions on the lateral column left foot to palpation      Assessment & Plan:   Assessment: Plantar fasciitis left  Plan: Today I review the results of examination with patient today and made her aware that her symptoms still are consistent with plantar fasciitis. I told her generally over a year's time most likely the symptoms ultimately would would resolve. Today I recommended application of fascial  taping to the left foot for complete immobilization. I recommended that she wear an athletic style shoe with this taping and leave on possibly up to 5 days Rx 10 mg six-day tapering prednisone Dosepak  Reappoint set 7 days for reapplication of fascial taping left

## 2015-12-08 ENCOUNTER — Ambulatory Visit (INDEPENDENT_AMBULATORY_CARE_PROVIDER_SITE_OTHER): Payer: Medicaid Other | Admitting: Podiatry

## 2015-12-08 DIAGNOSIS — Z9889 Other specified postprocedural states: Secondary | ICD-10-CM

## 2015-12-08 DIAGNOSIS — M722 Plantar fascial fibromatosis: Secondary | ICD-10-CM

## 2015-12-08 NOTE — Progress Notes (Signed)
Subjective:     Patient ID: Stephanie Serrano, female   DOB: 07/15/1966, 49 y.o.   MRN: [id number]  HPI patient presents stating I am still having some pain in my left heel and I want my spur removed 10 weeks after having fascial release left with wound edges well coapted   Review of Systems     Objective:   Physical Exam Neurovascular status intact negative Homans sign noted with only mild discomfort to palpation plantar heel left with wound edges that if healed well with no drainage and mild edema consistent with 10 weeks postop    Assessment:     Doing well post endoscopic surgery left with only mild discomfort at this time when palpated    Plan:     Reviewed a great length and I do not recommend spur removal and she is insistent that she wants this done and at her family members of have this done and I have recommended she seek a second opinion as she refuses to listen to my advice that a 10 weeks its normal still have mild swelling and pain and it should gradually subside over time. I tried to discuss this with the patient in a rational way was unable to do this so for this patient she is going to seek another doctor who would consider this procedure for her

## 2016-01-20 ENCOUNTER — Telehealth: Payer: Self-pay | Admitting: Family Medicine

## 2016-01-20 ENCOUNTER — Ambulatory Visit (INDEPENDENT_AMBULATORY_CARE_PROVIDER_SITE_OTHER): Payer: Medicaid Other | Admitting: Family Medicine

## 2016-01-20 ENCOUNTER — Encounter: Payer: Self-pay | Admitting: Family Medicine

## 2016-01-20 VITALS — BP 126/77 | HR 74 | Temp 98.1°F | Resp 16 | Ht 62.0 in | Wt 181.0 lb

## 2016-01-20 DIAGNOSIS — F429 Obsessive-compulsive disorder, unspecified: Secondary | ICD-10-CM

## 2016-01-20 NOTE — Telephone Encounter (Signed)
Patient is requesting [facility] to [facility] Phone [phone]/Fax  [phone]

## 2016-01-20 NOTE — Patient Instructions (Signed)
We will put in referral to [facility]. You can call for the appointment.

## 2016-01-20 NOTE — Telephone Encounter (Signed)
Please call and schedule an appointment, she has not been seen since September 2016 and needs evaluation and documentation before she can be referred by [place]. Thanks!

## 2016-01-20 NOTE — Progress Notes (Signed)
Patient ID: Stephanie Serrano, female   DOB: Dec 23, 1965, 50 y.o.   MRN: [id number]   Stephanie Serrano, is a 50 y.o. female  [id number]  [id number]  DOB - 1966/01/29  CC:  Chief Complaint  Patient presents with  . Referral    wants a referral for therapy       HPI: Stephanie Serrano is a 50 y.o. female here requesting a referral to Valora Piccolo at [facility] for Psychotherapy. She has been diagnoses with OCD and anxiety and has been being seen by a Dr. Toy Care. She is no longer able to afford her visit and she does not take Medicaid. She recommended she go back to [employer], who Stephanie Serrano had seen in the past. She called for an appointment and found she will need a referral from primary care. She reports being on Zoloft 50 mg for her symptoms but feels the psychotherapy is very helpful.  Allergies  Allergen Reactions  . Azithromycin   . Clindamycin/Lincomycin   . Penicillins   . Sulfa Antibiotics   . Vicodin [Hydrocodone-Acetaminophen] Itching   Past Medical History  Diagnosis Date  . RLS (restless legs syndrome)   . Vertigo   . Migraines   . GERD (gastroesophageal reflux disease)   . Esophageal stricture    Current Outpatient Prescriptions on File Prior to Visit  Medication Sig Dispense Refill  . ALPRAZolam (XANAX) 0.5 MG tablet Take 1 tablet (0.5 mg total) by mouth 2 (two) times daily as needed for anxiety. 50 tablet 0  . Chlorphen-PE-Acetaminophen (NOREL AD) 4-10-325 MG TABS Take 1 tablet by mouth every 6 (six) hours as needed. 20 tablet 0  . meclizine (ANTIVERT) 12.5 MG tablet Take 12.5 mg by mouth 3 (three) times daily as needed for dizziness.    . etodolac (LODINE XL) 400 MG 24 hr tablet Take one tablet in the morning with food and one table in the evening with food. (Patient not taking: Reported on 01/20/2016) 30 tablet 0  . famotidine (PEPCID) 20 MG tablet Take 1 tablet (20 mg total) by mouth daily. (Patient not taking: Reported on 01/20/2016) 30 tablet 3  .  HYDROcodone-acetaminophen (NORCO) 10-325 MG tablet Take 1 tablet by mouth every 8 (eight) hours as needed. (Patient not taking: Reported on 01/20/2016) 20 tablet 0  . ibuprofen (ADVIL,MOTRIN) 600 MG tablet Take 1 tablet (600 mg total) by mouth every 8 (eight) hours as needed. (Patient not taking: Reported on 01/20/2016) 30 tablet 0  . levofloxacin (LEVAQUIN) 500 MG tablet Take 1 tablet (500 mg total) by mouth daily. (Patient not taking: Reported on 01/20/2016) 7 tablet 0  . meperidine (DEMEROL) 50 MG tablet Take 1 tablet (50 mg total) by mouth every 4 (four) hours as needed for severe pain. (Patient not taking: Reported on 01/20/2016) 30 tablet 0  . omeprazole (PRILOSEC) 20 MG capsule Take 1 capsule (20 mg total) by mouth daily. (Patient not taking: Reported on 01/20/2016) 30 capsule 3  . predniSONE (STERAPRED UNI-PAK 21 TAB) 10 MG (21) TBPK tablet Tapering 6 days dose pack (Patient not taking: Reported on 01/20/2016) 21 tablet 0  . promethazine (PHENERGAN) 25 MG tablet Take 1 tablet (25 mg total) by mouth every 8 (eight) hours as needed for nausea or vomiting. (Patient not taking: Reported on 01/20/2016) 30 tablet 0   No current facility-administered medications on file prior to visit.   Family History  Problem Relation Age of Onset  . Pancreatic cancer Father   . Diabetes Paternal Grandmother   . Esophageal  cancer Neg Hx   . Stomach cancer Neg Hx   . Diabetes Mother   . Heart disease Mother   . Anxiety disorder Brother    Social History   Social History  . Marital Status: Divorced    Spouse Name: N/A  . Number of Children: 2  . Years of Education: N/A   Occupational History  . [employer]    Social History Main Topics  . Smoking status: Never Smoker   . Smokeless tobacco: Never Used  . Alcohol Use: Yes     Comment: socially  . Drug Use: No  . Sexual Activity: No   Other Topics Concern  . Not on file   Social History Narrative    Review of Systems: Constitutional: Negative for  fever, chills, appetite change, weight loss,  Fatigue. Skin: Negative for rashes or lesions of concern. HENT: Negative for ear pain, ear discharge.nose bleeds Eyes: Negative for pain, discharge, redness, itching and visual disturbance. Neck: Negative for pain, stiffness Respiratory: Negative for cough, shortness of breath,   Cardiovascular: Negative for chest pain, palpitations and leg swelling. Gastrointestinal: Negative for abdominal pain, nausea, vomiting, diarrhea, constipations Genitourinary: Negative for dysuria, urgency, frequency, hematuria,  Musculoskeletal: Negative for back pain, joint pain, joint  swelling, and gait problem.Negative for weakness. Neurological: Negative for dizziness, tremors, seizures, syncope,   light-headedness, numbness and headaches.  Hematological: Negative for easy bruising or bleeding Psychiatric/Behavioral: Negative for depression, anxiety, decreased concentration, confusion   Objective:   Filed Vitals:   01/20/16 1512  BP: 126/77  Pulse: 74  Temp: 98.1 F (36.7 C)  Resp: 16    Physical Exam: Constitutional: Patient appears well-developed and well-nourished. No distress. HENT: Normocephalic, atraumatic, External right and left ear normal. Oropharynx is clear and moist.  Eyes: Conjunctivae and EOM are normal. PERRLA, no scleral icterus. Neck: Normal ROM. Neck supple. No lymphadenopathy, No thyromegaly. CVS: RRR, S1/S2 +, no murmurs, no gallops, no rubs Pulmonary: Effort and breath sounds normal, no stridor, rhonchi, wheezes, rales.  Abdominal: Soft. Normoactive BS,, no distension, tenderness, rebound or guarding.  Musculoskeletal: Normal range of motion. No edema and no tenderness.  Neuro: Alert.Normal muscle tone coordination. Non-focal Skin: Skin is warm and dry. No rash noted. Not diaphoretic. No erythema. No pallor. Psychiatric: Normal mood and affect. Behavior, judgment, thought content normal.  Lab Results  Component Value Date   WBC  8.5 08/22/2015   HGB 12.8 08/22/2015   HCT 38.4 08/22/2015   MCV 88.9 08/22/2015   PLT 303 08/22/2015   Lab Results  Component Value Date   CREATININE 0.67 08/22/2015   BUN 10 08/22/2015   NA 138 08/22/2015   K 4.3 08/22/2015   CL 103 08/22/2015   CO2 26 08/22/2015    Lab Results  Component Value Date   HGBA1C 5.7* 08/22/2015   Lipid Panel     Component Value Date/Time   CHOL 243* 08/22/2015 1506   TRIG 153* 08/22/2015 1506   HDL 57 08/22/2015 1506   CHOLHDL 4.3 08/22/2015 1506   VLDL 31* 08/22/2015 1506   LDLCALC 155* 08/22/2015 1506       Assessment and plan:   1. OCD and anxiety -Referral to Valora Piccolo at [place] for Psychotherapy.   Follow-up as previously planned with Mrs. Hollis.  The patient was given clear instructions to go to ER or return to medical center if symptoms don't improve, worsen or new problems develop. The patient verbalized understanding.    Micheline Chapman FNP  01/20/2016, 3:44 PM

## 2016-02-08 ENCOUNTER — Ambulatory Visit (INDEPENDENT_AMBULATORY_CARE_PROVIDER_SITE_OTHER): Payer: Medicaid Other | Admitting: Family Medicine

## 2016-02-08 ENCOUNTER — Encounter: Payer: Self-pay | Admitting: Family Medicine

## 2016-02-08 VITALS — BP 130/86 | HR 76 | Temp 98.0°F | Resp 16 | Ht 62.5 in | Wt 180.0 lb

## 2016-02-08 DIAGNOSIS — M79672 Pain in left foot: Secondary | ICD-10-CM | POA: Diagnosis not present

## 2016-02-08 DIAGNOSIS — M722 Plantar fascial fibromatosis: Secondary | ICD-10-CM | POA: Diagnosis not present

## 2016-02-08 DIAGNOSIS — G4700 Insomnia, unspecified: Secondary | ICD-10-CM

## 2016-02-08 MED ORDER — GABAPENTIN 100 MG PO CAPS: 100.0000 mg | ORAL_CAPSULE | Freq: Two times a day (BID) | ORAL | Status: DC

## 2016-02-08 NOTE — Progress Notes (Signed)
Subjective:    Patient ID: Stephanie Serrano, female    DOB: 1966/08/04, 50 y.o.   MRN: [id number]    [name], a 50 year old with a history of left plantar fasciatis presents complaining of left foot pain. She describes pain as burning, intermittent, and 5/10 in intensity. She states that left heel pain is aggravated by weight bearing and walking. She continues to have mild swelling and pain.  She states that she has attempted rest, immobilization, and arch support without relief. Patient was referred to podiatry for left heel spur. She recently had a fascial release. She states that pain has worsened since left fascial release. She has discussed symptoms with podiatrist. She states that he does not recommend spur removal. She is insistent that she needs the spur removed and is requesting a referral to [place].   Foot Pain This is a recurrent problem. The current episode started more than 1 month ago. The problem occurs intermittently. The problem has been unchanged. Associated symptoms include myalgias (left foot pain). Pertinent negatives include no abdominal pain, chest pain, fatigue, nausea, neck pain, numbness, swollen glands, vertigo, visual change, vomiting or weakness. The symptoms are aggravated by standing and walking. She has tried immobilization and position changes for the symptoms. The treatment provided no relief.   Past Medical History  Diagnosis Date  . RLS (restless legs syndrome)   . Vertigo   . Migraines   . GERD (gastroesophageal reflux disease)   . Esophageal stricture    Social History   Social History  . Marital Status: Divorced    Spouse Name: N/A  . Number of Children: 2  . Years of Education: N/A   Occupational History  . [employer]    Social History Main Topics  . Smoking status: Never Smoker   . Smokeless tobacco: Never Used  . Alcohol Use: Yes     Comment: socially  . Drug Use: No  . Sexual Activity: No    Other Topics Concern  . Not on file   Social History Narrative   Allergies  Allergen Reactions  . Azithromycin   . Clindamycin/Lincomycin   . Penicillins   . Sulfa Antibiotics   . Vicodin [Hydrocodone-Acetaminophen] Itching   Immunization History  Administered Date(s) Administered  . Influenza,inj,Quad PF,36+ Mos 08/22/2015   Review of Systems  Constitutional: Negative for fatigue.  Eyes: Negative.   Respiratory: Negative.   Cardiovascular: Negative.  Negative for chest pain.  Gastrointestinal: Negative.  Negative for nausea, vomiting and abdominal pain.  Endocrine: Negative for polydipsia, polyphagia and polyuria.  Genitourinary: Negative.  Negative for dysuria, hematuria and difficulty urinating.  Musculoskeletal: Positive for myalgias (left foot pain). Negative for neck pain.  Skin: Negative.   Allergic/Immunologic: Negative for immunocompromised state.  Neurological: Negative.  Negative for vertigo, weakness and numbness.  Hematological: Negative.   Psychiatric/Behavioral: Negative.        Objective:   Physical Exam  Constitutional: She is oriented to person, place, and time. She appears well-developed and well-nourished.  HENT:  Head: Normocephalic and atraumatic.  Right Ear: External ear normal.  Left Ear: External ear normal.  Mouth/Throat: Oropharynx is clear and moist.  Eyes: Conjunctivae and EOM are normal. Pupils are equal, round, and reactive to light. No scleral icterus.  Neck: Normal range of motion. Neck supple.  Cardiovascular: Normal rate, regular rhythm, normal heart sounds and intact distal pulses.   Pulmonary/Chest: Effort normal and breath sounds normal.  Abdominal: Soft.  Bowel sounds are normal.  Musculoskeletal: Normal range of motion.  Pain primarily to left heel. Pain intensified by dorsiflexion and inversion  Neurological: She is alert and oriented to person, place, and time. She has normal reflexes.  Skin: Skin is warm and dry.   Psychiatric: She has a normal mood and affect. Her behavior is normal. Judgment and thought content normal.  Nursing note and vitals reviewed.      BP 130/86 mmHg  Pulse 76  Temp(Src) 98 F (36.7 C) (Oral)  Resp 16  Ht 5' 2.5" (1.588 m)  Wt 180 lb (81.647 kg)  BMI 32.38 kg/m2  SpO2 99%  LMP 02/07/2016 Assessment & Plan:  1. Left foot pain Patient states that she would like for heel spur to be removed. She is insistent that she has this done due to having famility members that have had this procedure done with success. Reviewed notes from podiatry and Dr. Paulla Dolly recommended that she not have that procedure done. Patient insists on a send opinion and would like a referral to [facility]. Patient is complaining of burning pain to left foot that radiates to left lower extremity. Will start a trial of Gabapentin 100 mg BID.  -Recommend arch support with daily tasks -Recommend that patient decrease weight by following a lowfat, low carbohydrate diet divided over 5-6 small meals and increased water intake -Rest left foot, elevate extremity to heart level -Ice massage 15-20 minutes 4 times daily -Avoid flat shoes or walking without shoes  - gabapentin (NEURONTIN) 100 MG capsule; Take 1 capsule (100 mg total) by mouth 2 (two) times daily.  Dispense: 60 capsule; Refill: 0 - Ambulatory referral to Sports Medicine  2. Plantar fasciitis of left foot - Ambulatory referral to Sports Medicine  Dorena Dew, FNP  The patient was given clear instructions to go to ER or return to medical center if symptoms do not improve, worsen or new problems develop. The patient verbalized understanding. Will notify patient with laboratory results.

## 2016-02-08 NOTE — Patient Instructions (Addendum)
Will start a trial of Gabapentin 100 mg twice daily for burning pain to left foot.  Will send a referral to orthopedic physician.  Plantar Fasciitis Plantar fasciitis is a painful foot condition that affects the heel. It occurs when the band of tissue that connects the toes to the heel bone (plantar fascia) becomes irritated. This can happen after exercising too much or doing other repetitive activities (overuse injury). The pain from plantar fasciitis can range from mild irritation to severe pain that makes it difficult for you to walk or move. The pain is usually worse in the morning or after you have been sitting or lying down for a while. CAUSES This condition may be caused by:  Standing for long periods of time.  Wearing shoes that do not fit.  Doing high-impact activities, including running, aerobics, and ballet.  Being overweight.  Having an abnormal way of walking (gait).  Having tight calf muscles.  Having high arches in your feet.  Starting a new athletic activity. SYMPTOMS The main symptom of this condition is heel pain. Other symptoms include:  Pain that gets worse after activity or exercise.  Pain that is worse in the morning or after resting.  Pain that goes away after you walk for a few minutes. DIAGNOSIS This condition may be diagnosed based on your signs and symptoms. Your health care provider will also do a physical exam to check for:  A tender area on the bottom of your foot.  A high arch in your foot.  Pain when you move your foot.  Difficulty moving your foot. You may also need to have imaging studies to confirm the diagnosis. These can include:  X-rays.  Ultrasound.  MRI. TREATMENT  Treatment for plantar fasciitis depends on the severity of the condition. Your treatment may include:  Rest, ice, and over-the-counter pain medicines to manage your pain.  Exercises to stretch your calves and your plantar fascia.  A splint that holds your foot in  a stretched, upward position while you sleep (night splint).  Physical therapy to relieve symptoms and prevent problems in the future.  Cortisone injections to relieve severe pain.  Extracorporeal shock wave therapy (ESWT) to stimulate damaged plantar fascia with electrical impulses. It is often used as a last resort before surgery.  Surgery, if other treatments have not worked after 12 months. HOME CARE INSTRUCTIONS  Take medicines only as directed by your health care provider.  Avoid activities that cause pain.  Roll the bottom of your foot over a bag of ice or a bottle of cold water. Do this for 20 minutes, 3-4 times a day.  Perform simple stretches as directed by your health care provider.  Try wearing athletic shoes with air-sole or gel-sole cushions or soft shoe inserts.  Wear a night splint while sleeping, if directed by your health care provider.  Keep all follow-up appointments with your health care provider. PREVENTION   Do not perform exercises or activities that cause heel pain.  Consider finding low-impact activities if you continue to have problems.  Lose weight if you need to. The best way to prevent plantar fasciitis is to avoid the activities that aggravate your plantar fascia. SEEK MEDICAL CARE IF:  Your symptoms do not go away after treatment with home care measures.  Your pain gets worse.  Your pain affects your ability to move or do your daily activities.   This information is not intended to replace advice given to you by your health care provider.  Make sure you discuss any questions you have with your health care provider.   Document Released: 08/21/2001 Document Revised: 08/17/2015 Document Reviewed: 10/06/2014 Elsevier Interactive Patient Education [employer].

## 2016-02-10 ENCOUNTER — Ambulatory Visit: Payer: Medicaid Other | Admitting: Family Medicine

## 2016-02-24 DIAGNOSIS — M722 Plantar fascial fibromatosis: Secondary | ICD-10-CM

## 2016-03-13 ENCOUNTER — Ambulatory Visit (HOSPITAL_COMMUNITY)
Admission: EM | Admit: 2016-03-13 | Discharge: 2016-03-13 | Disposition: A | Discharge status: Home / Self Care | Payer: Medicaid Other | Source: Home / Self Care | Attending: Family Medicine | Admitting: Family Medicine

## 2016-03-13 ENCOUNTER — Encounter (HOSPITAL_COMMUNITY): Payer: Self-pay | Admitting: Emergency Medicine

## 2016-03-13 DIAGNOSIS — L299 Pruritus, unspecified: Secondary | ICD-10-CM | POA: Diagnosis not present

## 2016-03-13 MED ORDER — PREDNISONE 20 MG PO TABS: 40.0000 mg | ORAL_TABLET | Freq: Every day | ORAL | Status: DC

## 2016-03-13 NOTE — ED Provider Notes (Signed)
CSN: [id number]     Arrival date & time 03/13/16  1346 History   First MD Initiated Contact with Patient 03/13/16 1531     Chief Complaint  Patient presents with  . Hair/Scalp Problem   (Consider location/radiation/quality/duration/timing/severity/associated sxs/prior Treatment) HPI Itchy scalp since January after having a "weave" put in her hair. Since that time she has had itchiness of the scalp. She has used multiple OTC meds without relief of itching. Including nizoral, head and shoulder shampoo. Anti itching hair med. No signs of lice, dandruff. Past Medical History  Diagnosis Date  . RLS (restless legs syndrome)   . Vertigo   . Migraines   . GERD (gastroesophageal reflux disease)   . Esophageal stricture    Past Surgical History  Procedure Laterality Date  . Foot surgery    . Nasal sinus surgery    . Esophagogastroduodenoscopy N/A 02/09/2015    Procedure: ESOPHAGOGASTRODUODENOSCOPY (EGD);  Surgeon: Inda Castle, MD;  Location: [facility];  Service: Endoscopy;  Laterality: N/A;  with dilation   Family History  Problem Relation Age of Onset  . Pancreatic cancer Father   . Diabetes Paternal Grandmother   . Esophageal cancer Neg Hx   . Stomach cancer Neg Hx   . Diabetes Mother   . Heart disease Mother   . Anxiety disorder Brother    Social History  Substance Use Topics  . Smoking status: Never Smoker   . Smokeless tobacco: Never Used  . Alcohol Use: Yes     Comment: socially   OB History    No data available     Review of Systems Itchy scalp Allergies  Azithromycin; Clindamycin/lincomycin; Penicillins; Sulfa antibiotics; and Vicodin  Home Medications   Prior to Admission medications   Medication Sig Start Date End Date Taking? Authorizing Provider  ALPRAZolam Duanne Moron) 0.5 MG tablet Take 1 tablet (0.5 mg total) by mouth 2 (two) times daily as needed for anxiety. 10/27/15   Wallene Huh, DPM  gabapentin (NEURONTIN) 100 MG capsule Take 1 capsule (100 mg  total) by mouth 2 (two) times daily. 02/08/16   Dorena Dew, FNP  meclizine (ANTIVERT) 12.5 MG tablet Take 12.5 mg by mouth 3 (three) times daily as needed for dizziness.    Historical Provider, MD  predniSONE (DELTASONE) 20 MG tablet Take 2 tablets (40 mg total) by mouth daily. 03/13/16   Konrad Felix, PA  sertraline (ZOLOFT) 50 MG tablet Take 50 mg by mouth daily.    Historical Provider, MD  zolpidem (AMBIEN) 10 MG tablet Take 10 mg by mouth at bedtime. 01/26/16   Historical Provider, MD   Meds Ordered and Administered this Visit  Medications - No data to display  BP 156/106 mmHg  Pulse 69  Temp(Src) 98.2 F (36.8 C) (Oral)  Resp 14  SpO2 100%  LMP 03/03/2016 No data found.   Physical Exam NURSES NOTES AND VITAL SIGNS REVIEWED. CONSTITUTIONAL: Well developed, well nourished, no acute distress HEENT: normocephalic, atraumatic EYES: Conjunctiva normal NECK:normal ROM, supple, no adenopathy PULMONARY:No respiratory distress, normal effort MUSCULOSKELETAL: Normal ROM of all extremities,  SKIN: warm and dry without rash, hair appears normal without lesions or mites. PSYCHIATRIC: Mood and affect, behavior are normal   ED Course  Procedures (including critical care time)  Labs Review Labs Reviewed - No data to display  Imaging Review No results found.   Visual Acuity Review  Right Eye Distance:   Left Eye Distance:   Bilateral Distance:    Right Eye  Near:   Left Eye Near:    Bilateral Near:     rx for prednisone    MDM   1. Itchy scalp    Patient is reassured that there are no issues that require transfer to higher level of care at this time or additional tests. Patient is advised to continue home symptomatic treatment. Patient is advised that if there are new or worsening symptoms to attend the emergency department, contact primary care provider, or return to UC. Instructions of care provided discharged home in stable condition.    THIS NOTE WAS  GENERATED USING A VOICE RECOGNITION SOFTWARE PROGRAM. ALL REASONABLE EFFORTS  WERE MADE TO PROOFREAD THIS DOCUMENT FOR ACCURACY.  I have verbally reviewed the discharge instructions with the patient. A printed AVS was given to the patient.  All questions were answered prior to discharge.      Konrad Felix, [place] 03/13/16 1734

## 2016-03-13 NOTE — Discharge Instructions (Signed)
Pruritus °Pruritus is an itching feeling. There are many different conditions and factors that can make your skin itchy. Dry skin is one of the most common causes of itching. Most cases of itching do not require medical attention. Itchy skin can turn into a rash.  °HOME CARE INSTRUCTIONS  °Watch your pruritus for any changes. Take these steps to help with your condition:  °Skin Care °· Moisturize your skin as needed. A moisturizer that contains petroleum jelly is best for keeping moisture in your skin. °· Take or apply medicines only as directed by your health care provider. This may include: °¨ Corticosteroid cream. °¨ Anti-itch lotions. °¨ Oral anti-histamines. °· Apply cool compresses to the affected areas. °· Try taking a bath with: °¨ Epsom salts. Follow the instructions on the packaging. You can get these at your local pharmacy or grocery store. °¨ Baking soda. Pour a small amount into the bath as directed by your health care provider. °¨ Colloidal oatmeal. Follow the instructions on the packaging. You can get this at your local pharmacy or grocery store. °· Try applying baking soda paste to your skin. Stir water into baking soda until it reaches a paste-like consistency.   °· Do not scratch your skin. °· Avoid hot showers or baths, which can make itching worse. A cold shower may help with itching as long as you use a moisturizer after. °· Avoid scented soaps, detergents, and perfumes. Use gentle soaps, detergents, perfumes, and other cosmetic products. °General Instructions °· Avoid wearing tight clothes. °· Keep a journal to help track what causes your itch. Write down: °¨ What you eat. °¨ What cosmetic products you use. °¨ What you drink. °¨ What you wear. This includes jewelry. °· Use a humidifier. This keeps the air moist, which helps to prevent dry skin. °SEEK MEDICAL CARE IF: °· The itching does not go away after several days. °· You sweat at night. °· You have weight loss. °· You are unusually  thirsty. °· You urinate more than normal. °· You are more tired than normal. °· You have abdominal pain. °· Your skin tingles. °· You feel weak. °· Your skin or the whites of your eyes look yellow (jaundice). °· Your skin feels numb. °  °This information is not intended to replace advice given to you by your health care provider. Make sure you discuss any questions you have with your health care provider. °  °Document Released: 08/08/2011 Document Revised: 04/12/2015 Document Reviewed: 11/22/2014 °Elsevier Interactive Patient Education ©2016 [employer] ° °

## 2016-03-13 NOTE — ED Notes (Signed)
Patient reports she has had an itchy scalp x 3 months. She had a weave extensions sew-in her hair in January and she took them out after one week. She has tried various antifungal shampoos. She cut 6 inches off her hair due to damage. She is concerned she is now losing her hair and it falls out in large quantities.

## 2016-03-19 ENCOUNTER — Ambulatory Visit (INDEPENDENT_AMBULATORY_CARE_PROVIDER_SITE_OTHER): Payer: Medicaid Other | Admitting: Family Medicine

## 2016-03-19 ENCOUNTER — Encounter: Payer: Self-pay | Admitting: Family Medicine

## 2016-03-19 VITALS — BP 133/83 | HR 93 | Temp 97.6°F | Ht 62.5 in | Wt 172.0 lb

## 2016-03-19 DIAGNOSIS — L659 Nonscarring hair loss, unspecified: Secondary | ICD-10-CM

## 2016-03-19 DIAGNOSIS — R3915 Urgency of urination: Secondary | ICD-10-CM

## 2016-03-19 LAB — POCT URINALYSIS DIP (DEVICE)
BILIRUBIN URINE: NEGATIVE
GLUCOSE, UA: NEGATIVE mg/dL
Hgb urine dipstick: NEGATIVE
KETONES UR: NEGATIVE mg/dL
Leukocytes, UA: NEGATIVE
Nitrite: NEGATIVE
PH: 5.5 (ref 5.0–8.0)
Protein, ur: NEGATIVE mg/dL
Specific Gravity, Urine: >=1.03 (ref 1.005–1.030)
Urobilinogen, UA: 0.2 mg/dL (ref 0.0–1.0)

## 2016-03-19 NOTE — Progress Notes (Signed)
Patient ID: Stephanie Serrano, female   DOB: September 28, 1966, 50 y.o.   MRN: [id number]   Stephanie Serrano, is a 50 y.o. female  [id number]  [id number]  DOB - 06/16/1966  CC:  Chief Complaint  Patient presents with  . alopecia    has had hair loss and scalp itching has tried dandruff shampoo without success, thought hair loss maybe attributed to Ambien so she d/c that med , compaints of frequent urination, and she feels like she doesn't empty completely, RL back discomfort       HPI: Stephanie Serrano is a 49 y.o. female here to with complaint of scalp itching. Starting several weeks ago, she started having hair loss. She stopped taking her SSRI and ambien. The hair loss has stopped but she continues to have intense scalp itching. She also mentions some trunk itching. She reports associating this with hair weaving. She has a history of OCD but denies hair pulling.  She also reports urinary frequency and feeling of incomplete emptying.   Allergies  Allergen Reactions  . Azithromycin   . Clindamycin/Lincomycin   . Penicillins   . Sulfa Antibiotics   . Vicodin [Hydrocodone-Acetaminophen] Itching   Past Medical History  Diagnosis Date  . RLS (restless legs syndrome)   . Vertigo   . Migraines   . GERD (gastroesophageal reflux disease)   . Esophageal stricture    Current Outpatient Prescriptions on File Prior to Visit  Medication Sig Dispense Refill  . ALPRAZolam (XANAX) 0.5 MG tablet Take 1 tablet (0.5 mg total) by mouth 2 (two) times daily as needed for anxiety. (Patient not taking: Reported on 03/19/2016) 50 tablet 0  . gabapentin (NEURONTIN) 100 MG capsule Take 1 capsule (100 mg total) by mouth 2 (two) times daily. (Patient not taking: Reported on 03/19/2016) 60 capsule 0  . meclizine (ANTIVERT) 12.5 MG tablet Take 12.5 mg by mouth 3 (three) times daily as needed for dizziness. Reported on 03/19/2016    . predniSONE (DELTASONE) 20 MG tablet Take 2 tablets (40 mg total) by mouth daily.  (Patient not taking: Reported on 03/19/2016) 10 tablet 0  . sertraline (ZOLOFT) 50 MG tablet Take 50 mg by mouth daily. Reported on 03/19/2016    . zolpidem (AMBIEN) 10 MG tablet Take 10 mg by mouth at bedtime. Reported on 03/19/2016  0   No current facility-administered medications on file prior to visit.   Family History  Problem Relation Age of Onset  . Pancreatic cancer Father   . Diabetes Paternal Grandmother   . Esophageal cancer Neg Hx   . Stomach cancer Neg Hx   . Diabetes Mother   . Heart disease Mother   . Anxiety disorder Brother    Social History   Social History  . Marital Status: Divorced    Spouse Name: N/A  . Number of Children: 2  . Years of Education: N/A   Occupational History  . [employer]    Social History Main Topics  . Smoking status: Never Smoker   . Smokeless tobacco: Never Used  . Alcohol Use: No     Comment: socially  . Drug Use: No  . Sexual Activity: No   Other Topics Concern  . Not on file   Social History Narrative    Review of Systems: Constitutional: Negative for fever, chills, appetite change, weight loss,  Fatigue. Skin: Negative for rashes or lesions of concern.Positive for scalp itching. Some itching of trunk. HENT: Negative for ear pain, ear discharge.nose bleeds Eyes: Negative for  pain, discharge, redness, itching and visual disturbance. Neck: Negative for pain, stiffness Respiratory: Negative for cough, shortness of breath,   Cardiovascular: Negative for chest pain, palpitations and leg swelling. Gastrointestinal: Negative for abdominal pain, nausea, vomiting, diarrhea, constipations Genitourinary: Positive for urgency, feeling of not emptying Musculoskeletal: Negative for back pain, joint pain, joint  swelling, and gait problem.Negative for weakness. Neurological: Negative for dizziness, tremors, seizures, syncope,   light-headedness, numbness and headaches.  Hematological: Negative for easy bruising or  bleeding Psychiatric/Behavioral: Negative for depression, anxiety, decreased concentration, confusion   Objective:   Filed Vitals:   03/19/16 1036  BP: 133/83  Pulse: 93  Temp: 97.6 F (36.4 C)    Physical Exam: Constitutional: Patient appears well-developed and well-nourished. No distress. HENT: Normocephalic, atraumatic, External right and left ear normal. Oropharynx is clear and moist.  Eyes: Conjunctivae and EOM are normal. PERRLA, no scleral icterus. Neck: Normal ROM. Neck supple. No lymphadenopathy, No thyromegaly. CVS: RRR, S1/S2 +, no murmurs, no gallops, no rubs Pulmonary: Effort and breath sounds normal, no stridor, rhonchi, wheezes, rales.  GU: Urine negative by dip Abdominal: Soft. Normoactive BS,, no distension, tenderness, rebound or guarding.  Musculoskeletal: Normal range of motion. No edema and no tenderness.  Neuro: Alert.Normal muscle tone coordination. Non-focal Skin: Skin is warm and dry. No rash noted. Not diaphoretic. No erythema. No pallor.I see no rash or dry scalp. Her hair is color treated and at roots feels stiff and wiry.No significant skin rash Psychiatric: Normal mood and affect. Behavior, judgment, thought content normal.  Lab Results  Component Value Date   WBC 8.5 08/22/2015   HGB 12.8 08/22/2015   HCT 38.4 08/22/2015   MCV 88.9 08/22/2015   PLT 303 08/22/2015   Lab Results  Component Value Date   CREATININE 0.67 08/22/2015   BUN 10 08/22/2015   NA 138 08/22/2015   K 4.3 08/22/2015   CL 103 08/22/2015   CO2 26 08/22/2015    Lab Results  Component Value Date   HGBA1C 5.7* 08/22/2015   Lipid Panel     Component Value Date/Time   CHOL 243* 08/22/2015 1506   TRIG 153* 08/22/2015 1506   HDL 57 08/22/2015 1506   CHOLHDL 4.3 08/22/2015 1506   VLDL 31* 08/22/2015 1506   LDLCALC 155* 08/22/2015 1506       Assessment and plan:   1. Alopecia, with scalp itching  - Ambulatory referral to Dermatology  2. Urgency of  urination  - Urine culture; Standing - Urine culture   Return if symptoms worsen or fail to improve.  The patient was given clear instructions to go to ER or return to medical center if symptoms don't improve, worsen or new problems develop. The patient verbalized understanding.    Micheline Chapman FNP  03/19/2016, 11:04 AM

## 2016-03-19 NOTE — Patient Instructions (Signed)
Urine looks OK here, but will send for culture We will put in referral to dermatology about scalp.

## 2016-03-22 LAB — CULTURE, URINE COMPREHENSIVE: Colony Count: 60000

## 2016-03-26 ENCOUNTER — Other Ambulatory Visit: Payer: Self-pay | Admitting: Family Medicine

## 2016-03-26 MED ORDER — CIPROFLOXACIN HCL 500 MG PO TABS: 500.0000 mg | ORAL_TABLET | Freq: Two times a day (BID) | ORAL | Status: DC

## 2016-03-30 ENCOUNTER — Ambulatory Visit: Payer: Medicaid Other | Admitting: Family Medicine

## 2016-07-30 ENCOUNTER — Other Ambulatory Visit: Payer: Self-pay | Admitting: Orthopedic Surgery

## 2016-07-31 ENCOUNTER — Encounter (HOSPITAL_BASED_OUTPATIENT_CLINIC_OR_DEPARTMENT_OTHER): Payer: Self-pay | Admitting: *Deleted

## 2016-08-03 ENCOUNTER — Encounter (HOSPITAL_BASED_OUTPATIENT_CLINIC_OR_DEPARTMENT_OTHER): Payer: Self-pay

## 2016-08-03 ENCOUNTER — Ambulatory Visit (HOSPITAL_BASED_OUTPATIENT_CLINIC_OR_DEPARTMENT_OTHER): Payer: Medicaid Other | Admitting: Anesthesiology

## 2016-08-03 ENCOUNTER — Ambulatory Visit (HOSPITAL_BASED_OUTPATIENT_CLINIC_OR_DEPARTMENT_OTHER)
Admission: RE | Admit: 2016-08-03 | Discharge: 2016-08-03 | Disposition: A | Discharge status: Home / Self Care | Payer: Medicaid Other | Source: Ambulatory Visit | Attending: Orthopedic Surgery | Admitting: Orthopedic Surgery

## 2016-08-03 ENCOUNTER — Encounter (HOSPITAL_BASED_OUTPATIENT_CLINIC_OR_DEPARTMENT_OTHER)
Admission: RE | Disposition: A | Discharge status: Home / Self Care | Payer: Self-pay | Source: Ambulatory Visit | Attending: Orthopedic Surgery

## 2016-08-03 DIAGNOSIS — F329 Major depressive disorder, single episode, unspecified: Secondary | ICD-10-CM | POA: Insufficient documentation

## 2016-08-03 DIAGNOSIS — M7731 Calcaneal spur, right foot: Secondary | ICD-10-CM | POA: Diagnosis not present

## 2016-08-03 DIAGNOSIS — Z79899 Other long term (current) drug therapy: Secondary | ICD-10-CM | POA: Insufficient documentation

## 2016-08-03 DIAGNOSIS — M722 Plantar fascial fibromatosis: Secondary | ICD-10-CM | POA: Insufficient documentation

## 2016-08-03 DIAGNOSIS — F419 Anxiety disorder, unspecified: Secondary | ICD-10-CM | POA: Insufficient documentation

## 2016-08-03 DIAGNOSIS — J45909 Unspecified asthma, uncomplicated: Secondary | ICD-10-CM | POA: Diagnosis not present

## 2016-08-03 HISTORY — DX: Major depressive disorder, single episode, unspecified: F32.9

## 2016-08-03 HISTORY — DX: Depression, unspecified: F32.A

## 2016-08-03 HISTORY — PX: PLANTAR FASCIA RELEASE: SHX2239

## 2016-08-03 HISTORY — DX: Plantar fascial fibromatosis: M72.2

## 2016-08-03 HISTORY — DX: Obsessive-compulsive disorder, unspecified: F42.9

## 2016-08-03 SURGERY — RELEASE, FASCIA, PLANTAR
Anesthesia: General | Site: Foot | Laterality: Right

## 2016-08-03 MED ORDER — VANCOMYCIN HCL IN DEXTROSE 1-5 GM/200ML-% IV SOLN
INTRAVENOUS | Status: AC
Start: 2016-08-03 — End: 2016-08-03
  Filled 2016-08-03: qty 200

## 2016-08-03 MED ORDER — OXYCODONE-ACETAMINOPHEN 5-325 MG PO TABS: 1.0000 | ORAL_TABLET | ORAL | 0 refills | Status: DC | PRN

## 2016-08-03 MED ORDER — DEXAMETHASONE SODIUM PHOSPHATE 10 MG/ML IJ SOLN
INTRAMUSCULAR | Status: AC
  Filled 2016-08-03: qty 1

## 2016-08-03 MED ORDER — PROPOFOL 10 MG/ML IV BOLUS
INTRAVENOUS | Status: DC | PRN
  Administered 2016-08-03: 200 mg via INTRAVENOUS

## 2016-08-03 MED ORDER — FENTANYL CITRATE (PF) 100 MCG/2ML IJ SOLN: 25.0000 ug | INTRAMUSCULAR | Status: DC | PRN

## 2016-08-03 MED ORDER — PROMETHAZINE HCL 25 MG/ML IJ SOLN
INTRAMUSCULAR | Status: AC
  Filled 2016-08-03: qty 1

## 2016-08-03 MED ORDER — FENTANYL CITRATE (PF) 100 MCG/2ML IJ SOLN
50.0000 ug | INTRAMUSCULAR | Status: DC | PRN
  Administered 2016-08-03: 100 ug via INTRAVENOUS

## 2016-08-03 MED ORDER — MIDAZOLAM HCL 2 MG/2ML IJ SOLN
INTRAMUSCULAR | Status: AC
  Filled 2016-08-03: qty 2

## 2016-08-03 MED ORDER — DEXAMETHASONE SODIUM PHOSPHATE 10 MG/ML IJ SOLN
INTRAMUSCULAR | Status: DC | PRN
  Administered 2016-08-03: 10 mg via INTRAVENOUS

## 2016-08-03 MED ORDER — ONDANSETRON HCL 4 MG/2ML IJ SOLN
INTRAMUSCULAR | Status: DC | PRN
  Administered 2016-08-03: 4 mg via INTRAVENOUS

## 2016-08-03 MED ORDER — SCOPOLAMINE 1 MG/3DAYS TD PT72: 1.0000 | MEDICATED_PATCH | Freq: Once | TRANSDERMAL | Status: DC | PRN

## 2016-08-03 MED ORDER — EPHEDRINE SULFATE 50 MG/ML IJ SOLN
INTRAMUSCULAR | Status: DC | PRN
  Administered 2016-08-03: 10 mg via INTRAVENOUS

## 2016-08-03 MED ORDER — MIDAZOLAM HCL 2 MG/2ML IJ SOLN
1.0000 mg | INTRAMUSCULAR | Status: DC | PRN
  Administered 2016-08-03: 2 mg via INTRAVENOUS

## 2016-08-03 MED ORDER — ONDANSETRON HCL 4 MG/2ML IJ SOLN
INTRAMUSCULAR | Status: AC
  Filled 2016-08-03: qty 2

## 2016-08-03 MED ORDER — PROPOFOL 10 MG/ML IV BOLUS
INTRAVENOUS | Status: AC
  Filled 2016-08-03: qty 20

## 2016-08-03 MED ORDER — CHLORHEXIDINE GLUCONATE 4 % EX LIQD: 60.0000 mL | Freq: Once | CUTANEOUS | Status: DC

## 2016-08-03 MED ORDER — VANCOMYCIN HCL IN DEXTROSE 1-5 GM/200ML-% IV SOLN
1000.0000 mg | INTRAVENOUS | Status: AC
  Administered 2016-08-03: 1000 mg via INTRAVENOUS

## 2016-08-03 MED ORDER — LACTATED RINGERS IV SOLN
INTRAVENOUS | Status: DC
  Administered 2016-08-03 (×2): via INTRAVENOUS

## 2016-08-03 MED ORDER — BUPIVACAINE HCL (PF) 0.5 % IJ SOLN
INTRAMUSCULAR | Status: AC
  Filled 2016-08-03: qty 30

## 2016-08-03 MED ORDER — BUPIVACAINE HCL (PF) 0.5 % IJ SOLN
INTRAMUSCULAR | Status: DC | PRN
  Administered 2016-08-03: 30 mL

## 2016-08-03 MED ORDER — BUPIVACAINE HCL (PF) 0.25 % IJ SOLN
INTRAMUSCULAR | Status: AC
Start: 2016-08-03 — End: 2016-08-03
  Filled 2016-08-03: qty 30

## 2016-08-03 MED ORDER — KETOROLAC TROMETHAMINE 30 MG/ML IJ SOLN
INTRAMUSCULAR | Status: DC | PRN
  Administered 2016-08-03: 30 mg via INTRAVENOUS

## 2016-08-03 MED ORDER — LIDOCAINE 2% (20 MG/ML) 5 ML SYRINGE
INTRAMUSCULAR | Status: DC | PRN
  Administered 2016-08-03: 80 mg via INTRAVENOUS

## 2016-08-03 MED ORDER — SODIUM CHLORIDE 0.9 % IJ SOLN
INTRAMUSCULAR | Status: AC
  Filled 2016-08-03: qty 10

## 2016-08-03 MED ORDER — FENTANYL CITRATE (PF) 100 MCG/2ML IJ SOLN
INTRAMUSCULAR | Status: AC
  Filled 2016-08-03: qty 2

## 2016-08-03 MED ORDER — VANCOMYCIN HCL 1000 MG IV SOLR
INTRAVENOUS | Status: AC
  Filled 2016-08-03: qty 1000

## 2016-08-03 MED ORDER — ONDANSETRON HCL 4 MG PO TABS: 4.0000 mg | ORAL_TABLET | Freq: Four times a day (QID) | ORAL | 0 refills | Status: DC | PRN

## 2016-08-03 MED ORDER — DOCUSATE SODIUM 100 MG PO CAPS: 100.0000 mg | ORAL_CAPSULE | Freq: Two times a day (BID) | ORAL | 0 refills | Status: DC

## 2016-08-03 MED ORDER — PROMETHAZINE HCL 25 MG/ML IJ SOLN
6.2500 mg | INTRAMUSCULAR | Status: DC | PRN
  Administered 2016-08-03: 6.25 mg via INTRAVENOUS

## 2016-08-03 SURGICAL SUPPLY — 56 items
BANDAGE ACE 4X5 VEL STRL LF (GAUZE/BANDAGES/DRESSINGS) ×4 IMPLANT
BLADE SURG 15 STRL LF DISP TIS (BLADE) ×2 IMPLANT
BLADE SURG 15 STRL SS (BLADE) ×2
BNDG CONFORM 2 STRL LF (GAUZE/BANDAGES/DRESSINGS) ×2 IMPLANT
BNDG ELASTIC 2X5.8 VLCR STR LF (GAUZE/BANDAGES/DRESSINGS) ×4 IMPLANT
BNDG ESMARK 4X9 LF (GAUZE/BANDAGES/DRESSINGS) ×2 IMPLANT
BNDG GAUZE ELAST 4 BULKY (GAUZE/BANDAGES/DRESSINGS) ×2 IMPLANT
CANISTER SUCT 1200ML W/VALVE (MISCELLANEOUS) IMPLANT
COVER BACK TABLE 60X90IN (DRAPES) ×2 IMPLANT
CUFF TOURNIQUET SINGLE 18IN (TOURNIQUET CUFF) IMPLANT
CUFF TOURNIQUET SINGLE 24IN (TOURNIQUET CUFF) IMPLANT
DECANTER SPIKE VIAL GLASS SM (MISCELLANEOUS) IMPLANT
DRAPE EXTREMITY T 121X128X90 (DRAPE) ×2 IMPLANT
DRAPE IMP U-DRAPE 54X76 (DRAPES) ×2 IMPLANT
DRAPE U-SHAPE 47X51 STRL (DRAPES) ×2 IMPLANT
DRSG EMULSION OIL 3X3 NADH (GAUZE/BANDAGES/DRESSINGS) ×2 IMPLANT
DURAPREP 26ML APPLICATOR (WOUND CARE) ×2 IMPLANT
ELECT REM PT RETURN 9FT ADLT (ELECTROSURGICAL) ×2
ELECTRODE REM PT RTRN 9FT ADLT (ELECTROSURGICAL) ×1 IMPLANT
GAUZE SPONGE 4X4 12PLY STRL (GAUZE/BANDAGES/DRESSINGS) ×2 IMPLANT
GAUZE XEROFORM 1X8 LF (GAUZE/BANDAGES/DRESSINGS) IMPLANT
GLOVE BIOGEL PI IND STRL 7.0 (GLOVE) ×2 IMPLANT
GLOVE BIOGEL PI IND STRL 8 (GLOVE) ×2 IMPLANT
GLOVE BIOGEL PI INDICATOR 7.0 (GLOVE) ×2
GLOVE BIOGEL PI INDICATOR 8 (GLOVE) ×2
GLOVE ECLIPSE 6.5 STRL STRAW (GLOVE) ×2 IMPLANT
GLOVE ECLIPSE 7.5 STRL STRAW (GLOVE) ×4 IMPLANT
GOWN STRL REUS W/ TWL LRG LVL3 (GOWN DISPOSABLE) ×1 IMPLANT
GOWN STRL REUS W/ TWL XL LVL3 (GOWN DISPOSABLE) ×1 IMPLANT
GOWN STRL REUS W/TWL LRG LVL3 (GOWN DISPOSABLE) ×1
GOWN STRL REUS W/TWL XL LVL3 (GOWN DISPOSABLE) ×3 IMPLANT
NEEDLE HYPO 25X1 1.5 SAFETY (NEEDLE) ×2 IMPLANT
NS IRRIG 1000ML POUR BTL (IV SOLUTION) ×2 IMPLANT
PACK BASIN DAY SURGERY FS (CUSTOM PROCEDURE TRAY) ×2 IMPLANT
PAD CAST 3X4 CTTN HI CHSV (CAST SUPPLIES) IMPLANT
PADDING CAST ABS 4INX4YD NS (CAST SUPPLIES) ×1
PADDING CAST ABS COTTON 4X4 ST (CAST SUPPLIES) ×1 IMPLANT
PADDING CAST COTTON 3X4 STRL (CAST SUPPLIES)
PADDING UNDERCAST 2 STRL (CAST SUPPLIES) ×1
PADDING UNDERCAST 2X4 STRL (CAST SUPPLIES) ×1 IMPLANT
PENCIL BUTTON HOLSTER BLD 10FT (ELECTRODE) ×2 IMPLANT
STOCKINETTE 4X48 STRL (DRAPES) ×2 IMPLANT
SUCTION FRAZIER HANDLE 10FR (MISCELLANEOUS) ×1
SUCTION TUBE FRAZIER 10FR DISP (MISCELLANEOUS) ×1 IMPLANT
SUT ETHILON 3 0 PS 1 (SUTURE) ×2 IMPLANT
SUT ETHILON 4 0 PS 2 18 (SUTURE) IMPLANT
SUT FIBERWIRE 2-0 18 17.9 3/8 (SUTURE)
SUT VIC AB 2-0 PS2 27 (SUTURE) ×2 IMPLANT
SUT VIC AB 3-0 FS2 27 (SUTURE) IMPLANT
SUTURE FIBERWR 2-0 18 17.9 3/8 (SUTURE) IMPLANT
SYR BULB 3OZ (MISCELLANEOUS) ×2 IMPLANT
SYR CONTROL 10ML LL (SYRINGE) ×2 IMPLANT
TOWEL OR 17X24 6PK STRL BLUE (TOWEL DISPOSABLE) ×2 IMPLANT
TOWEL OR NON WOVEN STRL DISP B (DISPOSABLE) IMPLANT
TUBE CONNECTING 20X1/4 (TUBING) ×2 IMPLANT
UNDERPAD 30X30 (UNDERPADS AND DIAPERS) ×2 IMPLANT

## 2016-08-03 NOTE — Anesthesia Preprocedure Evaluation (Addendum)
Anesthesia Evaluation  Patient identified by MRN, date of birth, ID band Patient awake    Reviewed: Allergy & Precautions, NPO status , Patient's Chart, lab work & pertinent test results  Airway Mallampati: II  TM Distance: >3 FB Neck ROM: Full    Dental no notable dental hx.    Pulmonary asthma ,    Pulmonary exam normal breath sounds clear to auscultation       Cardiovascular negative cardio ROS Normal cardiovascular exam Rhythm:Regular Rate:Normal     Neuro/Psych  Headaches, PSYCHIATRIC DISORDERS Anxiety Depression    GI/Hepatic Neg liver ROS, GERD  Controlled,  Endo/Other  negative endocrine ROS  Renal/GU negative Renal ROS  negative genitourinary   Musculoskeletal negative musculoskeletal ROS (+)   Abdominal (+) + obese,   Peds negative pediatric ROS (+)  Hematology negative hematology ROS (+)   Anesthesia Other Findings   Reproductive/Obstetrics negative OB ROS                           Anesthesia Physical Anesthesia Plan  ASA: II  Anesthesia Plan: General   Post-op Pain Management:    Induction: Intravenous  Airway Management Planned: LMA  Additional Equipment:   Intra-op Plan:   Post-operative Plan: Extubation in OR  Informed Consent: I have reviewed the patients History and Physical, chart, labs and discussed the procedure including the risks, benefits and alternatives for the proposed anesthesia with the patient or authorized representative who has indicated his/her understanding and acceptance.   Dental advisory given  Plan Discussed with: CRNA  Anesthesia Plan Comments:         Anesthesia Quick Evaluation

## 2016-08-03 NOTE — H&P (Signed)
PREOPERATIVE H&P  Chief Complaint: r foot pain  HPI: Stephanie Serrano is a 50 y.o. female who presents for evaluation of r foot pain. It has been present for over 1 year and has been worsening.  She had open plantar fascia release l side and has done well. She has failed conservative measures. Pain is rated as moderate.  Past Medical History:  Diagnosis Date  . Anxiety   . Asthma   . Depression   . Esophageal stricture   . Migraines   . OCD (obsessive compulsive disorder)   . Plantar fasciitis of right foot   . RLS (restless legs syndrome)   . Vertigo    Past Surgical History:  Procedure Laterality Date  . ESOPHAGOGASTRODUODENOSCOPY N/A 02/09/2015   Procedure: ESOPHAGOGASTRODUODENOSCOPY (EGD);  Surgeon: Inda Castle, MD;  Location: [facility];  Service: Endoscopy;  Laterality: N/A;  with dilation  . FOOT SURGERY    . NASAL SINUS SURGERY     Social History   Social History  . Marital status: Divorced    Spouse name: N/A  . Number of children: 2  . Years of education: N/A   Occupational History  . [employer]   Social History Main Topics  . Smoking status: Never Smoker  . Smokeless tobacco: Never Used  . Alcohol use Yes     Comment: socially  . Drug use: No  . Sexual activity: No   Other Topics Concern  . None   Social History Narrative  . None   Family History  Problem Relation Age of Onset  . Pancreatic cancer Father   . Diabetes Mother   . Heart disease Mother   . Anxiety disorder Brother   . Diabetes Paternal Grandmother   . Esophageal cancer Neg Hx   . Stomach cancer Neg Hx    Allergies  Allergen Reactions  . Azithromycin   . Clindamycin/Lincomycin   . Penicillins   . Sulfa Antibiotics   . Vicodin [Hydrocodone-Acetaminophen] Nausea Only   Prior to Admission medications   Medication Sig Start Date End Date Taking? Authorizing Provider  ALPRAZolam Duanne Moron) 0.5 MG tablet Take 1 tablet (0.5 mg total) by mouth 2 (two) times daily as needed  for anxiety. 10/27/15  Yes Tamala Fothergill Regal, DPM  FLUoxetine (PROZAC) 40 MG capsule Take 40 mg by mouth daily.   Yes Historical Provider, MD  albuterol (PROVENTIL HFA;VENTOLIN HFA) 108 (90 Base) MCG/ACT inhaler Inhale into the lungs every 6 (six) hours as needed for wheezing or shortness of breath.    Historical Provider, MD  meclizine (ANTIVERT) 12.5 MG tablet Take 12.5 mg by mouth 3 (three) times daily as needed for dizziness. Reported on 03/19/2016    Historical Provider, MD     Positive ROS: none  All other systems have been reviewed and were otherwise negative with the exception of those mentioned in the HPI and as above.  Physical Exam: There were no vitals filed for this visit.  General: Alert, no acute distress Cardiovascular: No pedal edema Respiratory: No cyanosis, no use of accessory musculature GI: No organomegaly, abdomen is soft and non-tender Skin: No lesions in the area of chief complaint Neurologic: Sensation intact distally Psychiatric: Patient is competent for consent with normal mood and affect Lymphatic: No axillary or cervical lymphadenopathy  MUSCULOSKELETAL: r foot: +ttp over plantar fascia origin XRAY+large insertional osteophyte  Assessment/Plan: RIGHT FOOT PLANTAR FASCITIS Plan for Procedure(s): PLANTAR FASCIA RELEASE WITH BONE SPUR EXCISION  The risks benefits and alternatives were discussed  with the patient including but not limited to the risks of nonoperative treatment, versus surgical intervention including infection, bleeding, nerve injury, malunion, nonunion, hardware prominence, hardware failure, need for hardware removal, blood clots, cardiopulmonary complications, morbidity, mortality, among others, and they were willing to proceed.  Predicted outcome is good, although there will be at least a six to nine month expected recovery.  Orestes Geiman L, MD 08/03/2016 7:30 AM

## 2016-08-03 NOTE — Anesthesia Procedure Notes (Signed)
Procedure Name: LMA Insertion Date/Time: 08/03/2016 8:55 AM Performed by: Maryella Shivers Pre-anesthesia Checklist: Patient identified, Emergency Drugs available, Suction available and Patient being monitored Patient Re-evaluated:Patient Re-evaluated prior to inductionOxygen Delivery Method: Circle system utilized Preoxygenation: Pre-oxygenation with 100% oxygen Intubation Type: IV induction Ventilation: Mask ventilation without difficulty LMA: LMA inserted LMA Size: 4.0 Number of attempts: 1 Airway Equipment and Method: Bite block Placement Confirmation: positive ETCO2 Tube secured with: Tape Dental Injury: Teeth and Oropharynx as per pre-operative assessment

## 2016-08-03 NOTE — Transfer of Care (Signed)
Immediate Anesthesia Transfer of Care Note  Patient: Stephanie Serrano  Procedure(s) Performed: Procedure(s): PLANTAR FASCIA RELEASE WITH BONE SPUR EXCISION (Right)  Patient Location: PACU  Anesthesia Type:General  Level of Consciousness: awake, alert  and oriented  Airway & Oxygen Therapy: Patient Spontanous Breathing and Patient connected to face mask oxygen  Post-op Assessment: Report given to RN and Post -op Vital signs reviewed and stable  Post vital signs: Reviewed and stable  Last Vitals:  Vitals:   08/03/16 0800  BP: (!) 145/79  Pulse: 88  Resp: 16  Temp: 36.6 C    Last Pain:  Vitals:   08/03/16 0800  TempSrc: Oral  PainSc: 6       Patients Stated Pain Goal: 2 ([id number])  Complications: No apparent anesthesia complications

## 2016-08-03 NOTE — Brief Op Note (Signed)
08/03/2016  1:46 PM  PATIENT:  Stephanie Serrano  50 y.o. female  PRE-OPERATIVE DIAGNOSIS:  RIGHT FOOT PLANTAR FASCITIS  POST-OPERATIVE DIAGNOSIS:  Right Foot plantar Fascitis  PROCEDURE:  Procedure(s): PLANTAR FASCIA RELEASE WITH BONE SPUR EXCISION (Right)  SURGEON:  Surgeon(s) and Role:    * Dorna Leitz, MD - Primary  PHYSICIAN ASSISTANT:   ASSISTANTS: bethune   ANESTHESIA:   general  EBL:  Total I/O In: [id number] [P.O.:220; I.V.:1200] Out: -   BLOOD ADMINISTERED:none  DRAINS: none   LOCAL MEDICATIONS USED:  MARCAINE     SPECIMEN:  No Specimen  DISPOSITION OF SPECIMEN:  N/A  COUNTS:  YES  TOURNIQUET:   Total Tourniquet Time Documented: Calf (Right) - 60 minutes Total: Calf (Right) - 60 minutes   DICTATION: .Other Dictation: Dictation Number [id number]  PLAN OF CARE: Discharge to home after PACU  PATIENT DISPOSITION:  PACU - hemodynamically stable.   Delay start of Pharmacological VTE agent (>24hrs) due to surgical blood loss or risk of bleeding: no

## 2016-08-03 NOTE — Discharge Instructions (Signed)
Ice/elevation right foot. Ambulate nonweightbearing with crutches. Keep dressing clean and dry. Reinforced your dressing if needed.   Post Anesthesia Home Care Instructions  Activity: Get plenty of rest for the remainder of the day. A responsible adult should stay with you for 24 hours following the procedure.  For the next 24 hours, DO NOT: -Drive a car -Paediatric nurse -Drink alcoholic beverages -Take any medication unless instructed by your physician -Make any legal decisions or sign important papers.  Meals: Start with liquid foods such as gelatin or soup. Progress to regular foods as tolerated. Avoid greasy, spicy, heavy foods. If nausea and/or vomiting occur, drink only clear liquids until the nausea and/or vomiting subsides. Call your physician if vomiting continues.  Special Instructions/Symptoms: Your throat may feel dry or sore from the anesthesia or the breathing tube placed in your throat during surgery. If this causes discomfort, gargle with warm salt water. The discomfort should disappear within 24 hours.  If you had a scopolamine patch placed behind your ear for the management of post- operative nausea and/or vomiting:  1. The medication in the patch is effective for 72 hours, after which it should be removed.  Wrap patch in a tissue and discard in the trash. Wash hands thoroughly with soap and water. 2. You may remove the patch earlier than 72 hours if you experience unpleasant side effects which may include dry mouth, dizziness or visual disturbances. 3. Avoid touching the patch. Wash your hands with soap and water after contact with the patch.

## 2016-08-03 NOTE — Anesthesia Postprocedure Evaluation (Signed)
Anesthesia Post Note  Patient: Aliah Lemelin  Procedure(s) Performed: Procedure(s) (LRB): PLANTAR FASCIA RELEASE WITH BONE SPUR EXCISION (Right)  Patient location during evaluation: PACU Anesthesia Type: General Level of consciousness: awake and alert Pain management: pain level controlled Vital Signs Assessment: post-procedure vital signs reviewed and stable Respiratory status: spontaneous breathing, nonlabored ventilation, respiratory function stable and patient connected to nasal cannula oxygen Cardiovascular status: blood pressure returned to baseline and stable Postop Assessment: no signs of nausea or vomiting Anesthetic complications: no    Last Vitals:  Vitals:   08/03/16 1110 08/03/16 1123  BP:  137/77  Pulse: 89 87  Resp: 17 16  Temp:  36.6 C    Last Pain:  Vitals:   08/03/16 1123  TempSrc:   PainSc: 0-No pain                 Klinton Candelas J

## 2016-08-06 ENCOUNTER — Encounter (HOSPITAL_BASED_OUTPATIENT_CLINIC_OR_DEPARTMENT_OTHER): Payer: Self-pay | Admitting: Orthopedic Surgery

## 2016-08-06 NOTE — Op Note (Signed)
Stephanie Serrano, Stephanie Serrano              ACCOUNT NO.:  [id number]  MEDICAL RECORD NO.:  [id number]  LOCATION:                                 FACILITY:  PHYSICIAN:  Alta Corning, M.D.   DATE OF BIRTH:  06/15/1966  DATE OF PROCEDURE:  08/03/2016 DATE OF DISCHARGE:                              OPERATIVE REPORT   PREOPERATIVE DIAGNOSIS:  Severe plantar fasciitis with large calcaneal spur.  POSTOPERATIVE DIAGNOSIS:  Severe plantar fasciitis with large calcaneal spur.  PROCEDURE: 1. Open release of plantar fascia. 2. Open excision of calcaneal spur by way of partial exostosis of the     calcaneus. 3. Interpretation of multiple intraoperative fluoroscopic images.  SURGEON:  Alta Corning, M.D.  ASSISTANT:  Gary Fleet, P.A.  ANESTHESIA:  General.  BRIEF HISTORY:  [name] is a 50 year old female with long history significant complaints of severe bilateral plantar fascitis, and an endoscopic done previously on the left that did really work, we did an open plantar fascia release with excision of heel spur.  She did great with that, and after failure of conservative care on the right side, she wanted the same procedure done, and we felt that it was appropriate. She was brought to the operating room for this procedure.  DESCRIPTION OF PROCEDURE:  Patient was brought to the operative room. After adequate anesthesia was obtained with general anesthetic, the patient was placed supine on the operating table.  The right leg was prepped and draped in usual sterile fashion.  Following this, an incision was made on the medial heel, encountered subcutaneous tissue down to the level of the adductor muscle, which was retracted out of the way and then sweeping out of the adductor, we went down to the calcaneus, and under direct vision, did a release of the plantar fascia and then excision of the plantar spur.  Once this was done, the heel was copiously and thoroughly irrigated and  suctioned dry.  The skin was closed with a combination of 2-0 Vicryl and 3-0 nylon interrupted. Multiple intraoperative fluoroscopic images were taken to make sure that we were in fact in the area of calcaneal spur and that had been completely excised.  At this point, the wounds were irrigated and suctioned dry.  As talked about and closed previously as discussed. Sterile compressive dressing was applied, and the patient was taken to the recovery and noted to be in satisfactory condition.  Estimated blood loss for procedure was minimal.     Alta Corning, M.D.   ______________________________ Alta Corning, M.D.    Corliss Skains  D:  08/03/2016  T:  08/04/2016  Job:  [id number]

## 2016-09-26 ENCOUNTER — Ambulatory Visit: Payer: Medicaid Other | Admitting: Physical Therapy

## 2016-10-02 ENCOUNTER — Ambulatory Visit: Payer: Medicaid Other | Attending: Orthopedic Surgery

## 2016-10-02 DIAGNOSIS — R262 Difficulty in walking, not elsewhere classified: Secondary | ICD-10-CM | POA: Insufficient documentation

## 2016-10-02 DIAGNOSIS — M79671 Pain in right foot: Secondary | ICD-10-CM | POA: Diagnosis present

## 2016-10-02 DIAGNOSIS — M7731 Calcaneal spur, right foot: Secondary | ICD-10-CM | POA: Diagnosis not present

## 2016-10-02 DIAGNOSIS — M25671 Stiffness of right ankle, not elsewhere classified: Secondary | ICD-10-CM

## 2016-10-02 NOTE — Therapy (Signed)
[place], [place], [address] Phone: [phone]   Fax:  [phone]  Physical Therapy Evaluation  Patient Details  Name: Stephanie Serrano MRN: [id number] Date of Birth: May 22, 1966 Referring Provider: Dorna Leitz ,MD  Encounter Date: 10/02/2016      PT End of Session - 10/02/16 1324    Visit Number 1   Number of Visits 4   Date for PT Re-Evaluation 11/30/16   Authorization Type Medicaid   Authorization - Visit Number 1   Authorization - Number of Visits 4   PT Start Time 0125   PT Stop Time 0215   PT Time Calculation (min) 50 min   Activity Tolerance Patient tolerated treatment well   Behavior During Therapy [facility] for tasks assessed/performed      Past Medical History:  Diagnosis Date  . Anxiety   . Asthma   . Depression   . Esophageal stricture   . Migraines   . OCD (obsessive compulsive disorder)   . Plantar fasciitis of right foot   . RLS (restless legs syndrome)   . Vertigo     Past Surgical History:  Procedure Laterality Date  . ESOPHAGOGASTRODUODENOSCOPY N/A 02/09/2015   Procedure: ESOPHAGOGASTRODUODENOSCOPY (EGD);  Surgeon: Inda Castle, MD;  Location: [facility];  Service: Endoscopy;  Laterality: N/A;  with dilation  . FOOT SURGERY    . NASAL SINUS SURGERY    . PLANTAR FASCIA RELEASE Right 08/03/2016   Procedure: PLANTAR FASCIA RELEASE WITH BONE SPUR EXCISION;  Surgeon: Dorna Leitz, MD;  Location: [place];  Service: Orthopedics;  Laterality: Right;    There were no vitals filed for this visit.       Subjective Assessment - 10/02/16 1333    Subjective She report issues with both feet ove the years and has had surgery on LT plantarfascia release x2 and spur removal and Now post RT foot release and removal.   She is limited without pain medications.      Limitations House hold activities;Walking;Standing   How long can you sit comfortably? As needed    How  long can you stand comfortably? 5 min   How long can you walk comfortably? 50 feet   Diagnostic tests NA   Patient Stated Goals To see what I can do to stretch. Doesn't know.    Currently in Pain? Yes   Pain Score 9    Pain Location Foot   Pain Orientation Right  plantar aspect   Pain Type Chronic pain   Pain Onset More than a month ago   Pain Frequency Constant   Aggravating Factors  Weight bearing.    Pain Relieving Factors Resting off feet, medication.             [facility] PT Assessment - 10/02/16 1322      Assessment   Medical Diagnosis plantarfascia release with spur removal   Referring Provider Dorna Leitz ,MD   Onset Date/Surgical Date 08/03/16   Next MD Visit 10/09/16   Prior Therapy no     Precautions   Precautions None     Restrictions   Weight Bearing Restrictions No     Balance Screen   Has the patient fallen in the past 6 months No   Has the patient had a decrease in activity level because of a fear of falling?  No   Is the patient reluctant to leave their home because of a fear of falling?  No  Prior Function   Level of Independence Needs assistance with ADLs  Can do all activity if using pain  medications   Vocation Unemployed     Cognition   Overall Cognitive Status Within Functional Limits for tasks assessed     ROM / Strength   AROM / PROM / Strength AROM;Strength     AROM   AROM Assessment Site Ankle   Right/Left Ankle Right;Left   Right Ankle Dorsiflexion 97  105 passively with knee 90 degrees flexed   Right Ankle Plantar Flexion 45   Right Ankle Inversion 25   Right Ankle Eversion 10   Left Ankle Dorsiflexion 93   Left Ankle Plantar Flexion 33   Left Ankle Inversion 25   Left Ankle Eversion 26     Strength   Strength Assessment Site Ankle   Right/Left Ankle Right;Left   Right Ankle Dorsiflexion 5/5   Right Ankle Plantar Flexion 4/5  supine   Right Ankle Inversion 5/5   Right Ankle Eversion 5/5   Left Ankle Dorsiflexion 5/5    Left Ankle Plantar Flexion 4/5   Left Ankle Inversion 5/5   Left Ankle Eversion 5/5     Ambulation/Gait   Gait Comments Decreased stride, decreased LE flexion with swing ,     No reaction to moderate to heavy pressure to plantar aspect and heel RT foot and calf                      PT Education - 10/02/16 1422    Education provided Yes   Education Details POC. Medicaid limits of 3 visits and HEP stretching   Person(s) Educated Patient   Methods Explanation;Demonstration;Verbal cues;Handout;Tactile cues   Comprehension Returned demonstration;Verbalized understanding             PT Long Term Goals - 10/02/16 1410      PT LONG TERM GOAL #1   Title She will be independent with all HEP issued  as of last visit   Baseline no program   Time 9   Period Weeks   Status New     PT LONG TERM GOAL #2   Title She will improve DF ROM to 105 degree active to improve walking tolerance   Baseline 97 degrees    Time 9   Period Weeks   Status New     PT LONG TERM GOAL #3   Title She will report able to walk for 10 min before painincreases   Baseline walking in home incr pain   Time 9   Period Weeks   Status New     PT LONG TERM GOAL #4   Title She will be to be on feet for 20 min or more for home tasks   Baseline stand for 10 min   Time 9   Period Weeks   Status New               Plan - 10/02/16 1325    Clinical Impression Statement Stephanie Serrano presents for low complexity eval post plantar fascia release (CPT 28008, 28250, [id number]) and bone spur removal . She reports 9/10 pain in RT foot but showed no signs of pain facially or body posturing, She does have some stiffness and PF weakness due to pain.  Sh was more cautious wwalking without her shoes but walked WNL with shoes.    Rehab Potential Good   PT Frequency --  3 visits   PT Duration --  oveer 9-10  weeks   PT Treatment/Interventions Cryotherapy;Iontophoresis 4mg /ml  Dexamethasone;Ultrasound;Passive range of motion;Patient/family education;Manual techniques;Taping;Therapeutic exercise   PT Next Visit Plan ROM and STW, Strength exercsie modalities as needed   PT Home Exercise Plan DF  PF  stretching   Consulted and Agree with Plan of Care Patient      Patient will benefit from skilled therapeutic intervention in order to improve the following deficits and impairments:  Pain, Decreased range of motion  Visit Diagnosis: Calcaneal spur of foot, right - Plan: PT plan of care cert/re-cert  Pain in right foot - Plan: PT plan of care cert/re-cert  Stiffness of right ankle, not elsewhere classified - Plan: PT plan of care cert/re-cert  Difficulty in walking, not elsewhere classified - Plan: PT plan of care cert/re-cert     Problem List Patient Active Problem List   Diagnosis Date Noted  . Insomnia 02/08/2016  . Heel spur 09/01/2015  . Family history of cystitis 08/31/2015  . Left foot pain 08/31/2015  . Prediabetes 08/23/2015  . Hyperlipidemia 08/23/2015  . Depression 03/21/2015  . Acute esophagitis 02/09/2015  . Gastroesophageal reflux disease without esophagitis 08/27/2014  . Dysphagia, unspecified(787.20) 03/22/2014    Darrel Hoover  PT 10/02/2016, 2:26 PM  [facility] [address] [place], [place], [address] Phone: [phone]   Fax:  [phone]  Name: Stephanie Serrano MRN: [id number] Date of Birth: 09/05/1966

## 2016-10-02 NOTE — Patient Instructions (Signed)
From cabinet DF and PF stretching and options for same 3x/day 2-3 reps 60-120 seconds.

## 2016-10-09 ENCOUNTER — Telehealth: Payer: Self-pay

## 2016-10-09 NOTE — Telephone Encounter (Signed)
Called and spoke with patient.

## 2016-10-15 ENCOUNTER — Ambulatory Visit: Payer: Medicaid Other

## 2016-10-18 ENCOUNTER — Ambulatory Visit: Payer: Medicaid Other | Attending: Orthopedic Surgery

## 2016-10-18 DIAGNOSIS — M79671 Pain in right foot: Secondary | ICD-10-CM | POA: Diagnosis present

## 2016-10-18 DIAGNOSIS — R262 Difficulty in walking, not elsewhere classified: Secondary | ICD-10-CM | POA: Diagnosis present

## 2016-10-18 DIAGNOSIS — M25671 Stiffness of right ankle, not elsewhere classified: Secondary | ICD-10-CM | POA: Insufficient documentation

## 2016-10-18 DIAGNOSIS — M7731 Calcaneal spur, right foot: Secondary | ICD-10-CM | POA: Diagnosis not present

## 2016-10-18 NOTE — Therapy (Signed)
[place], [place], [address] Phone: [phone]   Fax:  [phone]  Physical Therapy Treatment  Patient Details  Name: Stephanie Serrano MRN: [id number] Date of Birth: 1966-10-20 Referring Provider: Dorna Leitz ,MD  Encounter Date: 10/18/2016      PT End of Session - 10/18/16 1457    Visit Number 2   Date for PT Re-Evaluation 11/30/16   Authorization Type Medicaid   Authorization - Visit Number 2   Authorization - Number of Visits 4   PT Start Time 0220   PT Stop Time 0300   PT Time Calculation (min) 40 min   Activity Tolerance Patient tolerated treatment well   Behavior During Therapy [facility] for tasks assessed/performed      Past Medical History:  Diagnosis Date  . Anxiety   . Asthma   . Depression   . Esophageal stricture   . Migraines   . OCD (obsessive compulsive disorder)   . Plantar fasciitis of right foot   . RLS (restless legs syndrome)   . Vertigo     Past Surgical History:  Procedure Laterality Date  . ESOPHAGOGASTRODUODENOSCOPY N/A 02/09/2015   Procedure: ESOPHAGOGASTRODUODENOSCOPY (EGD);  Surgeon: Inda Castle, MD;  Location: [facility];  Service: Endoscopy;  Laterality: N/A;  with dilation  . FOOT SURGERY    . NASAL SINUS SURGERY    . PLANTAR FASCIA RELEASE Right 08/03/2016   Procedure: PLANTAR FASCIA RELEASE WITH BONE SPUR EXCISION;  Surgeon: Dorna Leitz, MD;  Location: [place];  Service: Orthopedics;  Laterality: Right;    There were no vitals filed for this visit.      Subjective Assessment - 10/18/16 1427    Subjective She reports incr pain on leavin last visit. Guess its good it was stretched   Currently in Pain? Yes   Pain Score 8    Pain Location Foot   Pain Orientation Right   Pain Descriptors / Indicators Burning   Pain Type Chronic pain   Pain Onset More than a month ago   Pain Frequency Constant   Aggravating Factors  weight bearing   Pain Relieving Factors rest , mes    Multiple Pain Sites Yes  Also less pain but RT foot also hurts                         [facility] Adult PT Treatment/Exercise - 10/18/16 0001      Exercises   Exercises Ankle     Manual Therapy   Manual Therapy Soft tissue mobilization;Passive ROM;Joint mobilization   Joint Mobilization General foot glide PA and AP and AP ctalus glides Gr 4    Soft tissue mobilization to arch and calf with tool     Toe yoga and arch raises issued and done with pt for HEP            PT Education - 10/18/16 1457    Education provided Yes   Education Details Toe yoga and arch lifts for home   Person(s) Educated Patient   Methods Explanation;Demonstration;Verbal cues;Handout   Comprehension Returned demonstration;Verbalized understanding             PT Long Term Goals - 10/02/16 1410      PT LONG TERM GOAL #1   Title She will be independent with all HEP issued  as of last visit   Baseline no program   Time 9   Period Weeks  Status New     PT LONG TERM GOAL #2   Title She will improve DF ROM to 105 degree active to improve walking tolerance   Baseline 97 degrees    Time 9   Period Weeks   Status New     PT LONG TERM GOAL #3   Title She will report able to walk for 10 min before painincreases   Baseline walking in home incr pain   Time 9   Period Weeks   Status New     PT LONG TERM GOAL #4   Title She will be to be on feet for 20 min or more for home tasks   Baseline stand for 10 min   Time 9   Period Weeks   Status New               Plan - 10/18/16 1458    Rehab Potential Good   PT Treatment/Interventions Cryotherapy;Iontophoresis 4mg /ml Dexamethasone;Ultrasound;Passive range of motion;Patient/family education;Manual techniques;Taping;Therapeutic exercise   PT Next Visit Plan ROM and STW, Strength exercsie modalities as needed   PT Home Exercise Plan DF  PF  stretching, toe yoga   Consulted and Agree with  Plan of Care Patient      Patient will benefit from skilled therapeutic intervention in order to improve the following deficits and impairments:  Pain, Decreased range of motion  Visit Diagnosis: Calcaneal spur of foot, right  Pain in right foot  Stiffness of right ankle, not elsewhere classified  Difficulty in walking, not elsewhere classified     Problem List Patient Active Problem List   Diagnosis Date Noted  . Insomnia 02/08/2016  . Heel spur 09/01/2015  . Family history of cystitis 08/31/2015  . Left foot pain 08/31/2015  . Prediabetes 08/23/2015  . Hyperlipidemia 08/23/2015  . Depression 03/21/2015  . Acute esophagitis 02/09/2015  . Gastroesophageal reflux disease without esophagitis 08/27/2014  . Dysphagia, unspecified(787.20) 03/22/2014    Darrel Hoover  PT 10/18/2016, 3:03 PM  [facility] [address] [place], [place], [address] Phone: [phone]   Fax:  [phone]  Name: Margorie Otwell MRN: [id number] Date of Birth: 1966/02/03

## 2016-10-19 ENCOUNTER — Ambulatory Visit (INDEPENDENT_AMBULATORY_CARE_PROVIDER_SITE_OTHER): Payer: Medicaid Other

## 2016-10-19 DIAGNOSIS — Z23 Encounter for immunization: Secondary | ICD-10-CM

## 2016-10-22 ENCOUNTER — Ambulatory Visit: Payer: Medicaid Other | Admitting: Physical Therapy

## 2016-10-22 NOTE — Progress Notes (Signed)
Flu and Tetanus shot administered.

## 2016-10-25 ENCOUNTER — Ambulatory Visit: Payer: Medicaid Other

## 2016-10-25 DIAGNOSIS — M7731 Calcaneal spur, right foot: Secondary | ICD-10-CM

## 2016-10-25 DIAGNOSIS — R262 Difficulty in walking, not elsewhere classified: Secondary | ICD-10-CM

## 2016-10-25 DIAGNOSIS — M79671 Pain in right foot: Secondary | ICD-10-CM

## 2016-10-25 DIAGNOSIS — M25671 Stiffness of right ankle, not elsewhere classified: Secondary | ICD-10-CM

## 2016-10-25 NOTE — Therapy (Addendum)
[place], [place], [address] Phone: [phone]   Fax:  [phone]  Physical Therapy Treatment/Discharge  Patient Details  Name: Stephanie Serrano MRN: [id number] Date of Birth: October 25, 1966 Referring Provider: Dorna Leitz ,MD  Encounter Date: 10/25/2016      PT End of Session - 10/25/16 1410    Visit Number 3   Number of Visits 4   Date for PT Re-Evaluation 11/30/16   Authorization Type Medicaid   Authorization - Visit Number 3   Authorization - Number of Visits 4   PT Start Time 0215   PT Stop Time 0247   PT Time Calculation (min) 32 min   Activity Tolerance Patient tolerated treatment well   Behavior During Therapy [facility] for tasks assessed/performed      Past Medical History:  Diagnosis Date  . Anxiety   . Asthma   . Depression   . Esophageal stricture   . Migraines   . OCD (obsessive compulsive disorder)   . Plantar fasciitis of right foot   . RLS (restless legs syndrome)   . Vertigo     Past Surgical History:  Procedure Laterality Date  . ESOPHAGOGASTRODUODENOSCOPY N/A 02/09/2015   Procedure: ESOPHAGOGASTRODUODENOSCOPY (EGD);  Surgeon: Inda Castle, MD;  Location: [facility];  Service: Endoscopy;  Laterality: N/A;  with dilation  . FOOT SURGERY    . NASAL SINUS SURGERY    . PLANTAR FASCIA RELEASE Right 08/03/2016   Procedure: PLANTAR FASCIA RELEASE WITH BONE SPUR EXCISION;  Surgeon: Dorna Leitz, MD;  Location: [place];  Service: Orthopedics;  Laterality: Right;    There were no vitals filed for this visit.      Subjective Assessment - 10/25/16 1412    Currently in Pain? Yes   Pain Score 8    Pain Location Foot   Pain Orientation Right   Pain Descriptors / Indicators Burning   Pain Type Chronic pain   Pain Onset More than a month ago   Pain Frequency Constant   Aggravating Factors  weigh bearing   Pain Relieving Factors rest meds   Multiple Pain Sites --   RT foot pain but less                         [facility] Adult PT Treatment/Exercise - 10/25/16 0001      Manual Therapy   Joint Mobilization --   Soft tissue mobilization --     Ankle Exercises: Seated   Other Seated Ankle Exercises 4 way band exercises x 15 yellow band issued for HEP and also a red band. Cued  for placement of band and appropriate movement of foot   Other Seated Ankle Exercises tennis ball mobs to each foot     Ankle Exercises: Standing   Other Standing Ankle Exercises reviewed stretching , toe and heel reaises  and arch lifts.                 PT Education - 10/25/16 1455    Education provided Yes   Education Details yellow band exercise and tennis ball stetch/mobs   Person(s) Educated Patient   Methods Explanation;Demonstration;Tactile cues;Verbal cues;Handout   Comprehension Returned demonstration;Verbalized understanding             PT Long Term Goals - 10/02/16 1410      PT LONG TERM GOAL #1   Title She will be independent with all HEP issued  as of last  visit   Baseline no program   Time 9   Period Weeks   Status New     PT LONG TERM GOAL #2   Title She will improve DF ROM to 105 degree active to improve walking tolerance   Baseline 97 degrees    Time 9   Period Weeks   Status New     PT LONG TERM GOAL #3   Title She will report able to walk for 10 min before painincreases   Baseline walking in home incr pain   Time 9   Period Weeks   Status New     PT LONG TERM GOAL #4   Title She will be to be on feet for 20 min or more for home tasks   Baseline stand for 10 min   Time 9   Period Weeks   Status New               Plan - 10/25/16 1449    Clinical Impression Statement No benefit from manual last visit that Ms Gildner could remember as since ther is only  1 more authorized visit I stopped manual and just added HEP. She did  not do her HEP correctly so after cues she was able to do so. Added Band  exercises    PT Treatment/Interventions Cryotherapy;Iontophoresis 48m/ml Dexamethasone;Ultrasound;Passive range of motion;Patient/family education;Manual techniques;Taping;Therapeutic exercise   PT Next Visit Plan Continue and finish HEp for strength and stretching then discharge.   Measure AROM   PT Home Exercise Plan DF  PF  stretching, toe yoga.  Band exer, ball mobs to plantar aspect each foot   Consulted and Agree with Plan of Care Patient      Patient will benefit from skilled therapeutic intervention in order to improve the following deficits and impairments:  Pain, Decreased range of motion  Visit Diagnosis: Pain in right foot  Stiffness of right ankle, not elsewhere classified  Difficulty in walking, not elsewhere classified  Calcaneal spur of foot, right     Problem List Patient Active Problem List   Diagnosis Date Noted  . Insomnia 02/08/2016  . Heel spur 09/01/2015  . Family history of cystitis 08/31/2015  . Left foot pain 08/31/2015  . Prediabetes 08/23/2015  . Hyperlipidemia 08/23/2015  . Depression 03/21/2015  . Acute esophagitis 02/09/2015  . Gastroesophageal reflux disease without esophagitis 08/27/2014  . Dysphagia, unspecified(787.20) 03/22/2014    CDarrel Hoover PT 10/25/2016, 2:57 PM  [facility] [place] [address]Phone: [phone]  Fax:  [phone] Name: Stephanie DuplessisMRN: [id number] of Birth: 104/11/1966 PHYSICAL THERAPY DISCHARGE SUMMARY  Visits from Start of Care: 3  Current functional level related to goals / functional outcomes: See above. She canceled 3 appointments and was only approved for 3 visits plus eval   Remaining deficits: See above  Education / Equipment: HEP Plan: Patient agrees to discharge.  Patient goals were partially met. Patient is being discharged due to not returning since the last visit.  ?????    SLillette BoxerChasse  PT   12/11/16              10:52 AM

## 2016-10-25 NOTE — Patient Instructions (Signed)
From cabinet band exercise for ankle 4 way red and yellow band issued, daily RT and LT  15-25 reps.

## 2016-10-26 ENCOUNTER — Ambulatory Visit (INDEPENDENT_AMBULATORY_CARE_PROVIDER_SITE_OTHER): Payer: Medicaid Other | Admitting: Family Medicine

## 2016-10-26 ENCOUNTER — Encounter: Payer: Self-pay | Admitting: Family Medicine

## 2016-10-26 VITALS — BP 129/80 | HR 100 | Temp 98.2°F | Resp 18 | Ht 62.0 in | Wt 188.0 lb

## 2016-10-26 DIAGNOSIS — Z13220 Encounter for screening for lipoid disorders: Secondary | ICD-10-CM | POA: Diagnosis not present

## 2016-10-26 DIAGNOSIS — R0683 Snoring: Secondary | ICD-10-CM | POA: Diagnosis not present

## 2016-10-26 DIAGNOSIS — Z131 Encounter for screening for diabetes mellitus: Secondary | ICD-10-CM | POA: Diagnosis not present

## 2016-10-26 DIAGNOSIS — R5383 Other fatigue: Secondary | ICD-10-CM | POA: Diagnosis not present

## 2016-10-26 LAB — COMPLETE METABOLIC PANEL WITH GFR
ALBUMIN: 4.1 g/dL (ref 3.6–5.1)
ALT: 14 U/L (ref 6–29)
AST: 17 U/L (ref 10–35)
Alkaline Phosphatase: 103 U/L (ref 33–115)
BUN: 9 mg/dL (ref 7–25)
CHLORIDE: 104 mmol/L (ref 98–110)
CO2: 27 mmol/L (ref 20–31)
CREATININE: 0.85 mg/dL (ref 0.50–1.10)
Calcium: 9 mg/dL (ref 8.6–10.2)
GFR, Est African American: >89 mL/min (ref >=60)
GFR, Est Non African American: 81 mL/min (ref >=60)
GLUCOSE: 83 mg/dL (ref 65–99)
POTASSIUM: 4.2 mmol/L (ref 3.5–5.3)
SODIUM: 137 mmol/L (ref 135–146)
Total Bilirubin: 0.3 mg/dL (ref 0.2–1.2)
Total Protein: 6.4 g/dL (ref 6.1–8.1)

## 2016-10-26 LAB — LIPID PANEL
Cholesterol: 270 mg/dL — ABNORMAL HIGH (ref <200)
HDL: 69 mg/dL (ref >50)
LDL CALC: 181 mg/dL — AB (ref <100)
TRIGLYCERIDES: 100 mg/dL (ref <150)
Total CHOL/HDL Ratio: 3.9 Ratio (ref <5.0)
VLDL: 20 mg/dL (ref <30)

## 2016-10-26 LAB — TSH: TSH: 1.79 mIU/L

## 2016-10-26 LAB — CBC WITH DIFFERENTIAL/PLATELET
BASOS ABS: 0 {cells}/uL (ref 0–200)
Basophils Relative: 0 %
EOS ABS: 224 {cells}/uL (ref 15–500)
Eosinophils Relative: 4 %
HEMATOCRIT: 37.2 % (ref 35.0–45.0)
HEMOGLOBIN: 12.1 g/dL (ref 11.7–15.5)
LYMPHS ABS: 2464 {cells}/uL (ref 850–3900)
Lymphocytes Relative: 44 %
MCH: 29.5 pg (ref 27.0–33.0)
MCHC: 32.5 g/dL (ref 32.0–36.0)
MCV: 90.7 fL (ref 80.0–100.0)
MONO ABS: 392 {cells}/uL (ref 200–950)
MPV: 9.8 fL (ref 7.5–12.5)
Monocytes Relative: 7 %
NEUTROS PCT: 45 %
Neutro Abs: 2520 cells/uL (ref 1500–7800)
Platelets: 253 10*3/uL (ref 140–400)
RBC: 4.1 MIL/uL (ref 3.80–5.10)
RDW: 14.5 % (ref 11.0–15.0)
WBC: 5.6 10*3/uL (ref 3.8–10.8)

## 2016-10-26 LAB — HEMOGLOBIN A1C
Hgb A1c MFr Bld: 5.4 % (ref <5.7)
MEAN PLASMA GLUCOSE: 108 mg/dL

## 2016-10-26 NOTE — Patient Instructions (Signed)
Someone will call you

## 2016-10-26 NOTE — Progress Notes (Signed)
Stephanie Serrano, is a 50 y.o. female  [id number]  [id number]  DOB - 18-Jun-1966  CC:  Chief Complaint  Patient presents with  . Referral       HPI: Stephanie Serrano is a 50 y.o. female presents today requesting referral for sleep study. She reports loud snorning and her daughter reports periods of apnea when sleeping. Pt reports not sleeping well and being fatigues.  Is almost time for routine follow-up so will do that today. She has a history of asthma, depression/anxiety, MHA. She complains of chronic pain in her lower extremeties. She has an orthopedic.   Allergies  Allergen Reactions  . Azithromycin   . Clindamycin/Lincomycin   . Penicillins   . Sulfa Antibiotics   . Vicodin [Hydrocodone-Acetaminophen] Nausea Only   Past Medical History:  Diagnosis Date  . Anxiety   . Asthma   . Depression   . Esophageal stricture   . Migraines   . OCD (obsessive compulsive disorder)   . Plantar fasciitis of right foot   . RLS (restless legs syndrome)   . Vertigo    Current Outpatient Prescriptions on File Prior to Visit  Medication Sig Dispense Refill  . albuterol (PROVENTIL HFA;VENTOLIN HFA) 108 (90 Base) MCG/ACT inhaler Inhale into the lungs every 6 (six) hours as needed for wheezing or shortness of breath.    . ondansetron (ZOFRAN) 4 MG tablet Take 1 tablet (4 mg total) by mouth every 6 (six) hours as needed for nausea or vomiting. 30 tablet 0  . oxyCODONE-acetaminophen (PERCOCET/ROXICET) 5-325 MG tablet Take 1-2 tablets by mouth every 4 (four) hours as needed for severe pain. 60 tablet 0  . ALPRAZolam (XANAX) 0.5 MG tablet Take 1 tablet (0.5 mg total) by mouth 2 (two) times daily as needed for anxiety. (Patient not taking: Reported on 10/26/2016) 50 tablet 0  . docusate sodium (COLACE) 100 MG capsule Take 1 capsule (100 mg total) by mouth 2 (two) times daily. 40 capsule 0  . FLUoxetine (PROZAC) 40 MG capsule Take 40 mg by mouth daily.     . meclizine (ANTIVERT) 12.5 MG  tablet Take 12.5 mg by mouth 3 (three) times daily as needed for dizziness. Reported on 03/19/2016     No current facility-administered medications on file prior to visit.    Family History  Problem Relation Age of Onset  . Pancreatic cancer Father   . Diabetes Mother   . Heart disease Mother   . Anxiety disorder Brother   . Diabetes Paternal Grandmother   . Esophageal cancer Neg Hx   . Stomach cancer Neg Hx    Social History   Social History  . Marital status: Divorced    Spouse name: N/A  . Number of children: 2  . Years of education: N/A   Occupational History  . [employer]   Social History Main Topics  . Smoking status: Never Smoker  . Smokeless tobacco: Never Used  . Alcohol use Yes     Comment: socially  . Drug use: No  . Sexual activity: No   Other Topics Concern  . Not on file   Social History Narrative  . No narrative on file    Review of Systems: Constitutional: Reports fatigue Skin: Negative HENT: Negative  Eyes: Negative  Neck: Negative Respiratory: Negative Cardiovascular: Negative Gastrointestinal: Negative Genitourinary: Negative  Musculoskeletal: Negative   Neurological: Negative for Hematological: Negative  Psychiatric/Behavioral: Negative    Objective:   Vitals:   10/26/16 0823  BP: 129/80  Pulse: 100  Resp: 18  Temp: 98.2 F (36.8 C)    Physical Exam: Constitutional: Patient appears well-developed and well-nourished. No distress. HENT: Normocephalic, atraumatic, External right and left ear normal. Oropharynx is clear and moist.  Eyes: Conjunctivae and EOM are normal. PERRLA, no scleral icterus. Neck: Normal ROM. Neck supple. No lymphadenopathy, No thyromegaly. CVS: RRR, S1/S2 +, no murmurs, no gallops, no rubs Pulmonary: Effort and breath sounds normal, no stridor, rhonchi, wheezes, rales.  Abdominal: Soft. Normoactive BS,, no distension, tenderness, rebound or guarding.  Musculoskeletal: Normal range of motion. No  edema and no tenderness.  Neuro: Alert.Normal muscle tone coordination. Non-focal Skin: Skin is warm and dry. No rash noted. Not diaphoretic. No erythema. No pallor. Psychiatric: Normal mood and affect. Behavior, judgment, thought content normal.  Lab Results  Component Value Date   WBC 8.5 08/22/2015   HGB 12.8 08/22/2015   HCT 38.4 08/22/2015   MCV 88.9 08/22/2015   PLT 303 08/22/2015   Lab Results  Component Value Date   CREATININE 0.67 08/22/2015   BUN 10 08/22/2015   NA 138 08/22/2015   K 4.3 08/22/2015   CL 103 08/22/2015   CO2 26 08/22/2015    Lab Results  Component Value Date   HGBA1C 5.7 (H) 08/22/2015   Lipid Panel     Component Value Date/Time   CHOL 243 (H) 08/22/2015 1506   TRIG 153 (H) 08/22/2015 1506   HDL 57 08/22/2015 1506   CHOLHDL 4.3 08/22/2015 1506   VLDL 31 (H) 08/22/2015 1506   LDLCALC 155 (H) 08/22/2015 1506        Assessment and plan:   1. Snoring  - Nocturnal polysomnography (NPSG); Future  2. Other fatigue  - COMPLETE METABOLIC PANEL WITH GFR - CBC with Differential - TSH  3. Screening cholesterol level  - Lipid p anel  4. Screening for diabetes mellitus  - Hemoglobin A1c   Return in about 6 months (around 04/25/2017).  The patient was given clear instructions to go to ER or return to medical center if symptoms don't improve, worsen or new problems develop. The patient verbalized understanding.    Micheline Chapman FNP  10/26/2016, 11:19 AM

## 2016-10-26 NOTE — Progress Notes (Signed)
Patient is here for referral to Sleep Study  Patient complains of chronic pain being present in the lower extremities. Pain is scaled at a 7 currently. Pain is described as numbing and tingling.  Patient has taken medication today. Patient has eaten today.

## 2016-10-29 ENCOUNTER — Other Ambulatory Visit: Payer: Self-pay | Admitting: Family Medicine

## 2016-10-29 ENCOUNTER — Telehealth: Payer: Self-pay

## 2016-10-29 ENCOUNTER — Ambulatory Visit: Payer: Medicaid Other

## 2016-10-29 MED ORDER — SIMVASTATIN 40 MG PO TABS: 40.0000 mg | ORAL_TABLET | Freq: Every day | ORAL | 1 refills | Status: DC

## 2016-10-29 NOTE — Telephone Encounter (Signed)
-----   Message from Micheline Chapman, NP sent at 10/29/2016  7:56 AM EST ----- A1C 5.4. Cmet nl.Chol total 270, LDL 181. Needs medicine for cholesterol. Will send in.TSH nl.CBC nl.

## 2016-10-29 NOTE — Telephone Encounter (Signed)
Called, left a message advising patient of elevated cholesterol and the need to start medication. I advised that medication has been sent into pharmacy and that she needs to avoid fried fatty foods. Patient verbalized understanding. Thanks!

## 2016-11-05 ENCOUNTER — Ambulatory Visit: Payer: Medicaid Other

## 2016-11-16 ENCOUNTER — Ambulatory Visit: Payer: Medicaid Other

## 2016-12-23 ENCOUNTER — Ambulatory Visit (HOSPITAL_BASED_OUTPATIENT_CLINIC_OR_DEPARTMENT_OTHER): Payer: Medicaid Other | Attending: Family Medicine | Admitting: Internal Medicine

## 2016-12-23 VITALS — Ht 62.0 in | Wt 185.0 lb

## 2016-12-23 DIAGNOSIS — R5383 Other fatigue: Secondary | ICD-10-CM | POA: Diagnosis not present

## 2016-12-23 DIAGNOSIS — R0683 Snoring: Secondary | ICD-10-CM | POA: Insufficient documentation

## 2016-12-23 DIAGNOSIS — G4700 Insomnia, unspecified: Secondary | ICD-10-CM | POA: Diagnosis not present

## 2016-12-29 DIAGNOSIS — R0683 Snoring: Secondary | ICD-10-CM

## 2016-12-29 NOTE — Procedures (Signed)
  Patient Name: Stephanie Serrano, Stephanie Serrano Date: 12/23/2016 Gender: Female D.O.B: [birth year] Age (years): 32 Referring Provider: Micheline Chapman Height (inches): 62 Interpreting Physician: Baird [name] MD, ABSM Weight (lbs): 185 RPSGT: Madelon Lips BMI: 34 MRN: [id number] Neck Size: 13.50 CLINICAL INFORMATION Sleep Study Type: NPSG     Indication for sleep study: Snoring     Epworth Sleepiness Score: 5/24     SLEEP STUDY TECHNIQUE As per the AASM Manual for the Scoring of Sleep and Associated Events v2.3 (April 2016) with a hypopnea requiring 4% desaturations.  The channels recorded and monitored were frontal, central and occipital EEG, electrooculogram (EOG), submentalis EMG (chin), nasal and oral airflow, thoracic and abdominal wall motion, anterior tibialis EMG, snore microphone, electrocardiogram, and pulse oximetry.  MEDICATIONS Medications self-administered by patient taken the night of the study : AMITRIPTYLINE, CLONAZEPAM, [place] The study was initiated at 10:27:48 PM and ended at 4:34:02 AM.  Sleep onset time was 13.4 minutes and the sleep efficiency was 89.8%. The total sleep time was 328.8 minutes.  Stage REM latency was 297.0 minutes.  The patient spent 9.43% of the night in stage N1 sleep, 89.96% in stage N2 sleep, 0.00% in stage N3 and 0.61% in REM.  Alpha intrusion was absent.  Supine sleep was 0.46%.  RESPIRATORY PARAMETERS The overall apnea/hypopnea index (AHI) was 0.2 per hour. There were 0 total apneas, including 0 obstructive, 0 central and 0 mixed apneas. There were 1 hypopneas and 2 RERAs.  The AHI during Stage REM sleep was 0.0 per hour.  AHI while supine was 0.0 per hour.  The mean oxygen saturation was 95.84%. The minimum SpO2 during sleep was 91.00%.  Loud snoring was noted during this study.  CARDIAC DATA The 2 lead EKG demonstrated sinus rhythm. The mean heart rate was 73.87 beats per minute. Other EKG  findings include: None. LEG MOVEMENT DATA The total PLMS were 136 with a resulting PLMS index of 24.82. Associated arousal with leg movement index was 3.1 .  IMPRESSIONS - No significant obstructive sleep apnea occurred during this study (AHI = 0.2/h). - No significant central sleep apnea occurred during this study (CAI = 0.0/h). - Mild oxygen desaturation was noted during this study (Min O2 = 91.00%). - The patient snored with Loud snoring volume. - No cardiac abnormalities were noted during this study. - Mild periodic limb movements of sleep occurred during the study. No significant associated arousals. 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 - Sleep hygiene should be reviewed to assess factors that may improve sleep quality. - Weight management and regular exercise should be initiated or continued if appropriate.  [Electronically signed] 12/29/2016 12:40 PM  Baird [name] MD, [place], American Board of Sleep Medicine   NPI: [id number]  [place], American Board of Sleep Medicine  ELECTRONICALLY SIGNED ON:  12/29/2016, 12:39 PM [place] PH: [phone]   FX: [phone] [place]

## 2016-12-31 NOTE — Progress Notes (Signed)
LM to CB

## 2017-01-07 ENCOUNTER — Other Ambulatory Visit: Payer: Self-pay | Admitting: Family Medicine

## 2017-01-07 DIAGNOSIS — R0683 Snoring: Secondary | ICD-10-CM

## 2017-01-18 DIAGNOSIS — G4733 Obstructive sleep apnea (adult) (pediatric): Secondary | ICD-10-CM | POA: Insufficient documentation

## 2017-01-29 ENCOUNTER — Other Ambulatory Visit: Payer: Self-pay

## 2017-03-21 ENCOUNTER — Encounter: Payer: Self-pay | Admitting: Family Medicine

## 2017-03-21 ENCOUNTER — Ambulatory Visit (INDEPENDENT_AMBULATORY_CARE_PROVIDER_SITE_OTHER): Payer: Medicaid Other | Admitting: Family Medicine

## 2017-03-21 ENCOUNTER — Ambulatory Visit (HOSPITAL_COMMUNITY)
Admission: RE | Admit: 2017-03-21 | Discharge: 2017-03-21 | Disposition: A | Discharge status: Home / Self Care | Payer: Medicaid Other | Source: Ambulatory Visit | Attending: Family Medicine | Admitting: Family Medicine

## 2017-03-21 VITALS — BP 124/70 | HR 70 | Temp 98.9°F | Ht 62.0 in | Wt 173.0 lb

## 2017-03-21 DIAGNOSIS — R339 Retention of urine, unspecified: Secondary | ICD-10-CM

## 2017-03-21 DIAGNOSIS — K5900 Constipation, unspecified: Secondary | ICD-10-CM

## 2017-03-21 DIAGNOSIS — R140 Abdominal distension (gaseous): Secondary | ICD-10-CM | POA: Diagnosis present

## 2017-03-21 LAB — POCT URINALYSIS DIP (DEVICE)
Bilirubin Urine: NEGATIVE
Glucose, UA: NEGATIVE mg/dL
HGB URINE DIPSTICK: NEGATIVE
Ketones, ur: NEGATIVE mg/dL
Leukocytes, UA: NEGATIVE
Nitrite: NEGATIVE
PROTEIN: NEGATIVE mg/dL
UROBILINOGEN UA: 0.2 mg/dL (ref 0.0–1.0)
pH: 6 (ref 5.0–8.0)

## 2017-03-21 LAB — POCT URINE PREGNANCY: Preg Test, Ur: NEGATIVE

## 2017-03-21 MED ORDER — LINACLOTIDE 72 MCG PO CAPS: 72.0000 ug | ORAL_CAPSULE | Freq: Every day | ORAL | 1 refills | Status: DC

## 2017-03-21 NOTE — Progress Notes (Addendum)
Patient ID: Stephanie Serrano, female    DOB: 06-May-1966, 51 y.o.   MRN: [id number]  PCP: Molli Barrows, FNP  Chief Complaint  Patient presents with  . SPOT ON FACE    LEFT SIDE UNDER EYE  . Constipation    Subjective:  HPI  Stephanie Serrano is a 51 y.o. female presents for evaluation of constipation and spot on face. Chronic problems include: OCD, GERD, and Depression.  Reports that she developed a spot on her face under her left eye when she began having problems completely emptying her urine. Reports that recently when she began to urinate that she has to forceably "push" to completely empty her bladder. Denies any odor or color change to urine. Denies CVA or pelvic tenderness or bloating. Uncertain of exactly how long this has been occurring, feels at least several weeks.  "So embrarrassed to discuss" patient reports constipation for over the last 3 weeks. Reports that she has been eating large amounts of fiber from Nutri-grain bars, salads, and dietary fiber supplements. Drinks on average 7 bottles of water but reports persistent constipation. She has had to use a laxatives to defecate with each bowel movement. She thinks her last bowel movement was about 3 weeks ago. Reports she had several episodes of diarrhea back in February.  Feels that she has a "blockage" somewhere. In addition to laxatives and she has taken stool softeners without success of bowel movements. Denies abdominal pain or bloating.  No prior diagnosis of IBS that she is aware of.      Social History   Social History  . Marital status: Divorced    Spouse name: N/A  . Number of children: 2  . Years of education: N/A   Occupational History  . [employer]   Social History Main Topics  . Smoking status: Never Smoker  . Smokeless tobacco: Never Used  . Alcohol use Yes     Comment: socially  . Drug use: No  . Sexual activity: No   Other Topics Concern  . Not on file   Social History Narrative   . No narrative on file    Family History  Problem Relation Age of Onset  . Pancreatic cancer Father   . Diabetes Mother   . Heart disease Mother   . Anxiety disorder Brother   . Diabetes Paternal Grandmother   . Esophageal cancer Neg Hx   . Stomach cancer Neg Hx    Review of Systems HPI  Patient Active Problem List   Diagnosis Date Noted  . Insomnia 02/08/2016  . Heel spur 09/01/2015  . Family history of cystitis 08/31/2015  . Left foot pain 08/31/2015  . Prediabetes 08/23/2015  . Hyperlipidemia 08/23/2015  . Depression 03/21/2015  . Acute esophagitis 02/09/2015  . Gastroesophageal reflux disease without esophagitis 08/27/2014  . Dysphagia, unspecified(787.20) 03/22/2014    Allergies  Allergen Reactions  . Azithromycin   . Clindamycin/Lincomycin   . Penicillins   . Sulfa Antibiotics   . Vicodin [Hydrocodone-Acetaminophen] Nausea Only    Prior to Admission medications   Medication Sig Start Date End Date Taking? Authorizing Provider  albuterol (PROVENTIL HFA;VENTOLIN HFA) 108 (90 Base) MCG/ACT inhaler Inhale into the lungs every 6 (six) hours as needed for wheezing or shortness of breath.   Yes Historical Provider, MD  amitriptyline (ELAVIL) 25 MG tablet Take 25 mg by mouth at bedtime.   Yes Historical Provider, MD  clonazePAM (KLONOPIN) 0.5 MG tablet Take 0.5 mg by mouth 2 (two) times  daily.   Yes Historical Provider, MD  FLUoxetine (PROZAC) 40 MG capsule Take 40 mg by mouth daily.    Yes Historical Provider, MD  ondansetron (ZOFRAN) 4 MG tablet Take 1 tablet (4 mg total) by mouth every 6 (six) hours as needed for nausea or vomiting. 08/03/16  Yes Gary Fleet, PA-C  oxyCODONE-acetaminophen (PERCOCET/ROXICET) 5-325 MG tablet Take 1-2 tablets by mouth every 4 (four) hours as needed for severe pain. 08/03/16  Yes Gary Fleet, PA-C  traZODone (DESYREL) 50 MG tablet Take 50 mg by mouth at bedtime.   Yes Historical Provider, MD  ALPRAZolam Duanne Moron) 0.5 MG tablet Take 1  tablet (0.5 mg total) by mouth 2 (two) times daily as needed for anxiety. Patient not taking: Reported on 03/21/2017 10/27/15   Tamala Fothergill Regal, DPM  docusate sodium (COLACE) 100 MG capsule Take 1 capsule (100 mg total) by mouth 2 (two) times daily. Patient not taking: Reported on 03/21/2017 08/03/16   Gary Fleet, PA-C  meclizine (ANTIVERT) 12.5 MG tablet Take 12.5 mg by mouth 3 (three) times daily as needed for dizziness. Reported on 03/19/2016    Historical Provider, MD  QUEtiapine (SEROQUEL) 50 MG tablet Take 50 mg by mouth at bedtime.    Historical Provider, MD  simvastatin (ZOCOR) 40 MG tablet Take 1 tablet (40 mg total) by mouth at bedtime. Patient not taking: Reported on 03/21/2017 10/29/16   Micheline Chapman, NP    Past Medical, Surgical Family and Social History reviewed and updated.    Objective:   Today's Vitals   03/21/17 1321  BP: 124/70  Pulse: 70  Temp: 98.9 F (37.2 C)  TempSrc: Oral  Weight: 173 lb (78.5 kg)  Height: 5\' 2"  (1.575 m)    Wt Readings from Last 3 Encounters:  03/21/17 173 lb (78.5 kg)  12/23/16 185 lb (83.9 kg)  10/26/16 188 lb (85.3 kg)   Physical Exam  Constitutional: She appears well-developed and well-nourished.  HENT:  Head: Normocephalic and atraumatic.  Neck: Normal range of motion. Neck supple.  Cardiovascular: Normal rate, regular rhythm, normal heart sounds and intact distal pulses.   Pulmonary/Chest: Effort normal and breath sounds normal.  Abdominal: Soft. Normal appearance and bowel sounds are normal. She exhibits no distension and no ascites. There is no tenderness. There is no rigidity, no rebound, no guarding, no CVA tenderness, no tenderness at McBurney's point and negative Murphy's sign.  Neurological: She is alert.  Skin: Skin is warm and dry.  No visible spot on face. Bilateral eyes lower lid puffiness present.    Psychiatric: Her behavior is normal. Judgment and thought content normal. Her mood appears anxious.      Assessment & Plan:  1. Urinary retention - Urine culture - POCT urine pregnancy  2. Constipation, unspecified constipation type - DG Abd 1 View  Constipation may be precipitated by excessive laxative use and intake of oral fiber supplements. In addition to constipation, patient reported a recent history of several weeks of diarrhea like stools. Will trial patient on Linzess for suspected IBS symptoms. Obtaining a KUB today to rule out renal stone as the cause of urinary retention and to further evaluate stool burden. Urine culture pending to rule out UTI although UA was unremarkable/  RTC: 3 months     Carroll Sage. Kenton Kingfisher, MSN, [facility] [address] [place], [place] [address] [phone]

## 2017-03-21 NOTE — Patient Instructions (Addendum)
Obtain x-ray today. You will be contact regarding your results within the next 24 hours.  For your irregular bowels activity, start Linzess 72 mcg daily before breakfast.    Irritable Bowel Syndrome, Adult Irritable bowel syndrome (IBS) is not one specific disease. It is a group of symptoms that affects the organs responsible for digestion (gastrointestinal or GI tract). To regulate how your GI tract works, your body sends signals back and forth between your intestines and your brain. If you have IBS, there may be a problem with these signals. As a result, your GI tract does not function normally. Your intestines may become more sensitive and overreact to certain things. This is especially true when you eat certain foods or when you are under stress. There are four types of IBS. These may be determined based on the consistency of your stool:  IBS with diarrhea.  IBS with constipation.  Mixed IBS.  Unsubtyped IBS. It is important to know which type of IBS you have. Some treatments are more likely to be helpful for certain types of IBS. What are the causes? The exact cause of IBS is not known. What increases the risk? You may have a higher risk of IBS if:  You are a woman.  You are younger than 51 years old.  You have a family history of IBS.  You have mental health problems.  You have had bacterial infection of your GI tract. What are the signs or symptoms? Symptoms of IBS vary from person to person. The main symptom is abdominal pain or discomfort. Additional symptoms usually include one or more of the following:  Diarrhea, constipation, or both.  Abdominal swelling or bloating.  Feeling full or sick after eating a small or regular-size meal.  Frequent gas.  Mucus in the stool.  A feeling of having more stool left after a bowel movement. Symptoms tend to come and go. They may be associated with stress, psychiatric conditions, or nothing at all. How is this  diagnosed? There is no specific test to diagnose IBS. Your health care provider will make a diagnosis based on a physical exam, medical history, and your symptoms. You may have other tests to rule out other conditions that may be causing your symptoms. These may include:  Blood tests.  X-rays.  CT scan.  Endoscopy and colonoscopy. This is a test in which your GI tract is viewed with a long, thin, flexible tube. How is this treated? There is no cure for IBS, but treatment can help relieve symptoms. IBS treatment often includes:  Changes to your diet, such as:  Eating more fiber.  Avoiding foods that cause symptoms.  Drinking more water.  Eating regular, medium-sized portioned meals.  Medicines. These may include:  Fiber supplements if you have constipation.  Medicine to control diarrhea (antidiarrheal medicines).  Medicine to help control muscle spasms in your GI tract (antispasmodic medicines).  Medicines to help with any mental health issues, such as antidepressants or tranquilizers.  Therapy.  Talk therapy may help with anxiety, depression, or other mental health issues that can make IBS symptoms worse.  Stress reduction.  Managing your stress can help keep symptoms under control. Follow these instructions at home:  Take medicines only as directed by your health care provider.  Eat a healthy diet.  Avoid foods and drinks with added sugar.  Include more whole grains, fruits, and vegetables gradually into your diet. This may be especially helpful if you have IBS with constipation.  Avoid any foods  and drinks that make your symptoms worse. These may include dairy products and caffeinated or carbonated drinks.  Do not eat large meals.  Drink enough fluid to keep your urine clear or pale yellow.  Exercise regularly. Ask your health care provider for recommendations of good activities for you.  Keep all follow-up visits as directed by your health care provider.  This is important. Contact a health care provider if:  You have constant pain.  You have trouble or pain with swallowing.  You have worsening diarrhea. Get help right away if:  You have severe and worsening abdominal pain.  You have diarrhea and:  You have a rash, stiff neck, or severe headache.  You are irritable, sleepy, or difficult to awaken.  You are weak, dizzy, or extremely thirsty.  You have bright red blood in your stool or you have black tarry stools.  You have unusual abdominal swelling that is painful.  You vomit continuously.  You vomit blood (hematemesis).  You have both abdominal pain and a fever. This information is not intended to replace advice given to you by your health care provider. Make sure you discuss any questions you have with your health care provider. Document Released: 11/26/2005 Document Revised: 04/27/2016 Document Reviewed: 08/13/2014 Elsevier Interactive Patient Education  2017 [employer].

## 2017-03-23 LAB — URINE CULTURE

## 2017-03-26 ENCOUNTER — Telehealth: Payer: Self-pay

## 2017-03-26 DIAGNOSIS — F429 Obsessive-compulsive disorder, unspecified: Secondary | ICD-10-CM | POA: Insufficient documentation

## 2017-03-26 NOTE — Telephone Encounter (Signed)
Advise patient that her urine results were completely normal and her image did not show any renal stones. If she is still experiencing urinary retention, I can refer her for evaluation with urology.

## 2017-03-27 NOTE — Telephone Encounter (Signed)
Patient would like a referral to Urology and patient would also like to know about a vaccine for shingles. Patient had a shingles outbreak before and would like a shot to prevent future breakouts.

## 2017-03-27 NOTE — Telephone Encounter (Signed)
She is may obtain a Shingrix vaccination for shingles prevention. Check with Mickel Baas to see if have this vaccination here in the office.

## 2017-03-28 ENCOUNTER — Other Ambulatory Visit: Payer: Self-pay | Admitting: Family Medicine

## 2017-03-28 DIAGNOSIS — R339 Retention of urine, unspecified: Secondary | ICD-10-CM

## 2017-03-29 MED ORDER — ZOSTER VAC RECOMB ADJUVANTED 50 MCG/0.5ML IM SUSR: 0.5000 mL | Freq: Once | INTRAMUSCULAR | 0 refills | Status: AC

## 2017-03-29 NOTE — Telephone Encounter (Signed)
Patient would like to have the shingles vaccine sent to the pharmacy [facility] on [address]

## 2017-03-29 NOTE — Telephone Encounter (Signed)
Left a vm for patient to callback 

## 2017-03-29 NOTE — Telephone Encounter (Signed)
E-prescribed Shingrix vaccination to pharmacy on [address]

## 2017-04-29 ENCOUNTER — Ambulatory Visit: Payer: Medicaid Other | Admitting: Family Medicine

## 2017-05-17 ENCOUNTER — Encounter: Payer: Self-pay | Admitting: Family Medicine

## 2017-05-17 ENCOUNTER — Ambulatory Visit (INDEPENDENT_AMBULATORY_CARE_PROVIDER_SITE_OTHER): Payer: Medicaid Other | Admitting: Family Medicine

## 2017-05-17 VITALS — BP 130/84 | HR 88 | Temp 97.9°F | Resp 14 | Ht 62.0 in | Wt 171.0 lb

## 2017-05-17 DIAGNOSIS — L219 Seborrheic dermatitis, unspecified: Secondary | ICD-10-CM | POA: Diagnosis not present

## 2017-05-17 DIAGNOSIS — Z1211 Encounter for screening for malignant neoplasm of colon: Secondary | ICD-10-CM

## 2017-05-17 DIAGNOSIS — Z1231 Encounter for screening mammogram for malignant neoplasm of breast: Secondary | ICD-10-CM

## 2017-05-17 DIAGNOSIS — L659 Nonscarring hair loss, unspecified: Secondary | ICD-10-CM | POA: Diagnosis not present

## 2017-05-17 DIAGNOSIS — Z1239 Encounter for other screening for malignant neoplasm of breast: Secondary | ICD-10-CM

## 2017-05-17 NOTE — Patient Instructions (Signed)
You will be notified of your appointment with [facility].  Your stool specimen kit will be mailed to your home address.  If your labs are abnormal, we will notify you of those results.  I recommend that you obtain a breast exam. Contact  To schedule your breast exam, please call [facility] at   Phone: [phone]     Exercising to Stay Healthy Exercising regularly is important. It has many health benefits, such as:  Improving your overall fitness, flexibility, and endurance.  Increasing your bone density.  Helping with weight control.  Decreasing your body fat.  Increasing your muscle strength.  Reducing stress and tension.  Improving your overall health.  In order to become healthy and stay healthy, it is recommended that you do moderate-intensity and vigorous-intensity exercise. You can tell that you are exercising at a moderate intensity if you have a higher heart rate and faster breathing, but you are still able to hold a conversation. You can tell that you are exercising at a vigorous intensity if you are breathing much harder and faster and cannot hold a conversation while exercising. How often should I exercise? Choose an activity that you enjoy and set realistic goals. Your health care provider can help you to make an activity plan that works for you. Exercise regularly as directed by your health care provider. This may include:  Doing resistance training twice each week, such as: ? Push-ups. ? Sit-ups. ? Lifting weights. ? Using resistance bands.  Doing a given intensity of exercise for a given amount of time. Choose from these options: ? 150 minutes of moderate-intensity exercise every week. ? 75 minutes of vigorous-intensity exercise every week. ? A mix of moderate-intensity and vigorous-intensity exercise every week.  Children, pregnant women, people who are out of shape, people who are overweight, and older adults may need to  consult a health care provider for individual recommendations. If you have any sort of medical condition, be sure to consult your health care provider before starting a new exercise program. What are some exercise ideas? Some moderate-intensity exercise ideas include:  Walking at a rate of 1 mile in 15 minutes.  Biking.  Hiking.  Golfing.  Dancing.  Some vigorous-intensity exercise ideas include:  Walking at a rate of at least 4.5 miles per hour.  Jogging or running at a rate of 5 miles per hour.  Biking at a rate of at least 10 miles per hour.  Lap swimming.  Roller-skating or in-line skating.  Cross-country skiing.  Vigorous competitive sports, such as football, basketball, and soccer.  Jumping rope.  Aerobic dancing.  What are some everyday activities that can help me to get exercise?  [place] work, such as: ? Pushing a Conservation officer, nature. ? Raking and bagging leaves.  Washing and waxing your car.  Pushing a stroller.  Shoveling snow.  Gardening.  Washing windows or floors. How can I be more active in my day-to-day activities?  Use the stairs instead of the elevator.  Take a walk during your lunch break.  If you drive, park your car farther away from work or school.  If you take public transportation, get off one stop early and walk the rest of the way.  Make all of your phone calls while standing up and walking around.  Get up, stretch, and walk around every 30 minutes throughout the day. What guidelines should I follow while exercising?  Do not exercise so much that you hurt yourself, feel dizzy,  or get very short of breath.  Consult your health care provider before starting a new exercise program.  Wear comfortable clothes and shoes with good support.  Drink plenty of water while you exercise to prevent dehydration or heat stroke. Body water is lost during exercise and must be replaced.  Work out until you breathe faster and your heart beats  faster. This information is not intended to replace advice given to you by your health care provider. Make sure you discuss any questions you have with your health care provider. Document Released: 12/29/2010 Document Revised: 05/03/2016 Document Reviewed: 04/29/2014 Elsevier Interactive Patient Education  [employer].

## 2017-05-17 NOTE — Progress Notes (Signed)
Patient ID: Stephanie Serrano, female    DOB: December 25, 1965, 51 y.o.   MRN: [id number]  PCP: Scot Jun, FNP  Chief Complaint  Patient presents with  . Dermatitis    in scalp    Subjective:  HPI  Stephanie Serrano is a 51 y.o. female presents for a dermatology referral. Makynli was previously seen at [facility] over 12 months ago and diagnosed with seborrheic  Dermatitis. Reports that she was treated with a shampoo that did not improve symptoms and would like to see another provider at their clinic. Reports that her current symptoms include: Daily itching of the scalp, continually since 2017. When she shampoos her hair it falls out in clumps and when she blow dry her hair it sheds. She uses hair dyes and chemicals in hair routinely but feels these products are not associated with her symptoms.  Review and discussed overdue health maintenance screenings.  Social History   Social History  . Marital status: Divorced    Spouse name: N/A  . Number of children: 2  . Years of education: N/A   Occupational History  . [employer]   Social History Main Topics  . Smoking status: Never Smoker  . Smokeless tobacco: Never Used  . Alcohol use Yes     Comment: socially  . Drug use: No  . Sexual activity: No   Other Topics Concern  . Not on file   Social History Narrative  . No narrative on file    Family History  Problem Relation Age of Onset  . Pancreatic cancer Father   . Diabetes Mother   . Heart disease Mother   . Anxiety disorder Brother   . Diabetes Paternal Grandmother   . Esophageal cancer Neg Hx   . Stomach cancer Neg Hx    Review of Systems See HPI Patient Active Problem List   Diagnosis Date Noted  . OCD (obsessive compulsive disorder) 03/26/2017  . Insomnia 02/08/2016  . Heel spur 09/01/2015  . Family history of cystitis 08/31/2015  . Left foot pain 08/31/2015  . Prediabetes 08/23/2015  . Hyperlipidemia 08/23/2015  . Depression  03/21/2015  . Acute esophagitis 02/09/2015  . Gastroesophageal reflux disease without esophagitis 08/27/2014  . Dysphagia, unspecified(787.20) 03/22/2014    Allergies  Allergen Reactions  . Azithromycin   . Clindamycin/Lincomycin   . Penicillins   . Sulfa Antibiotics   . Vicodin [Hydrocodone-Acetaminophen] Nausea Only    Prior to Admission medications   Medication Sig Start Date End Date Taking? Authorizing Provider  albuterol (PROVENTIL HFA;VENTOLIN HFA) 108 (90 Base) MCG/ACT inhaler Inhale into the lungs every 6 (six) hours as needed for wheezing or shortness of breath.   Yes [provider]  amitriptyline (ELAVIL) 25 MG tablet Take 25 mg by mouth at bedtime.   Yes [provider]  clonazePAM (KLONOPIN) 0.5 MG tablet Take 0.5 mg by mouth 2 (two) times daily.   Yes [provider]  docusate sodium (COLACE) 100 MG capsule Take 1 capsule (100 mg total) by mouth 2 (two) times daily. 08/03/16  Yes Gary Fleet, PA-C  FLUoxetine (PROZAC) 40 MG capsule Take 40 mg by mouth daily.    Yes [provider]  linaclotide Rolan Lipa) 72 MCG capsule Take 1 capsule (72 mcg total) by mouth daily before breakfast. 03/21/17  Yes Scot Jun, FNP  meclizine (ANTIVERT) 12.5 MG tablet Take 12.5 mg by mouth 3 (three) times daily as needed for dizziness. Reported on 03/19/2016   Yes [provider]  ondansetron (ZOFRAN) 4 MG tablet Take 1 tablet (4 mg total) by mouth every 6 (six) hours as needed for nausea or vomiting. 08/03/16  Yes Gary Fleet, PA-C  oxyCODONE-acetaminophen (PERCOCET/ROXICET) 5-325 MG tablet Take 1-2 tablets by mouth every 4 (four) hours as needed for severe pain. 08/03/16  Yes Gary Fleet, PA-C  traZODone (DESYREL) 50 MG tablet Take 50 mg by mouth at bedtime.   Yes [provider]  ALPRAZolam (XANAX) 0.5 MG tablet Take 1 tablet (0.5 mg total) by mouth 2 (two) times daily as needed for anxiety. Patient not taking: Reported on  05/17/2017 10/27/15   Wallene Huh, DPM  QUEtiapine (SEROQUEL) 50 MG tablet Take 50 mg by mouth at bedtime.    [provider]  simvastatin (ZOCOR) 40 MG tablet Take 1 tablet (40 mg total) by mouth at bedtime. Patient not taking: Reported on 05/17/2017 10/29/16   Micheline Chapman, NP    Past Medical, Surgical Family and Social History reviewed and updated.    Objective:   Today's Vitals   05/17/17 0837  BP: 130/84  Pulse: 88  Resp: 14  Temp: 97.9 F (36.6 C)  TempSrc: Oral  SpO2: 99%  Weight: 171 lb (77.6 kg)  Height: 5\' 2"  (1.575 m)    Wt Readings from Last 3 Encounters:  05/17/17 171 lb (77.6 kg)  03/21/17 173 lb (78.5 kg)  12/23/16 185 lb (83.9 kg)    Physical Exam  Constitutional: She is oriented to person, place, and time. She appears well-developed and well-nourished.  HENT:  Head: Normocephalic and atraumatic.  Eyes: Conjunctivae are normal. Pupils are equal, round, and reactive to light.  Neck: Normal range of motion. Neck supple. No thyromegaly present.  Cardiovascular: Normal rate, regular rhythm, normal heart sounds and intact distal pulses.   Pulmonary/Chest: Effort normal and breath sounds normal.  Musculoskeletal: Normal range of motion.  Neurological: She is alert and oriented to person, place, and time.  Skin: Skin is warm and dry.  Psychiatric: Her behavior is normal. Judgment and thought content normal. Her mood appears anxious.      Assessment & Plan:  1. Screen for colon cancer - Cologuard- instructions discussed. Order form completed.   2. Screening breast examination - [facility] SCREENING BREAST TOMO BILATERAL  3. Seborrheic dermatitis of scalp - Ambulatory referral to Dermatology  4. Hair thinning - Thyroid Panel With TSH-normal levels     RTC: 6 months for routine follow-up    Carroll Sage. Kenton Kingfisher, MSN, FNP-C [facility]  [address] [place] [place], [place] [address] [phone]

## 2017-05-18 LAB — THYROID PANEL WITH TSH
FREE THYROXINE INDEX: 2.1 (ref 1.4–3.8)
T3 UPTAKE: 33 % (ref 22–35)
T4 TOTAL: 6.3 ug/dL (ref 4.5–12.0)
TSH: 1.34 mIU/L

## 2017-05-20 ENCOUNTER — Telehealth: Payer: Self-pay

## 2017-05-20 NOTE — Telephone Encounter (Signed)
-----   Message from Scot Jun, [place] sent at 05/19/2017  2:16 PM EDT ----- Regarding: Referral Derm referral in system. Schedule referral with [facility] as patient was previously followed at practice

## 2017-05-20 NOTE — Telephone Encounter (Signed)
Referral sent 

## 2017-05-22 ENCOUNTER — Other Ambulatory Visit: Payer: Self-pay | Admitting: Family Medicine

## 2017-05-22 DIAGNOSIS — K5900 Constipation, unspecified: Secondary | ICD-10-CM

## 2017-05-27 ENCOUNTER — Telehealth: Payer: Self-pay

## 2017-05-27 NOTE — Telephone Encounter (Signed)
Patient refused the Cologuard per [employer]

## 2017-06-03 ENCOUNTER — Ambulatory Visit (HOSPITAL_COMMUNITY)
Admission: EM | Admit: 2017-06-03 | Discharge: 2017-06-03 | Disposition: A | Discharge status: Home / Self Care | Payer: Medicaid Other | Attending: Family Medicine | Admitting: Family Medicine

## 2017-06-03 ENCOUNTER — Encounter (HOSPITAL_COMMUNITY): Payer: Self-pay | Admitting: Emergency Medicine

## 2017-06-03 DIAGNOSIS — J329 Chronic sinusitis, unspecified: Secondary | ICD-10-CM | POA: Diagnosis not present

## 2017-06-03 DIAGNOSIS — J309 Allergic rhinitis, unspecified: Secondary | ICD-10-CM

## 2017-06-03 DIAGNOSIS — J301 Allergic rhinitis due to pollen: Secondary | ICD-10-CM | POA: Diagnosis not present

## 2017-06-03 MED ORDER — PREDNISONE 50 MG PO TABS: ORAL_TABLET | ORAL | 0 refills | Status: DC

## 2017-06-03 MED ORDER — TRIAMCINOLONE ACETONIDE 40 MG/ML IJ SUSP
40.0000 mg | Freq: Once | INTRAMUSCULAR | Status: AC
  Administered 2017-06-03: 40 mg via INTRAMUSCULAR

## 2017-06-03 MED ORDER — TRIAMCINOLONE ACETONIDE 40 MG/ML IJ SUSP
INTRAMUSCULAR | Status: AC
  Filled 2017-06-03: qty 1

## 2017-06-03 NOTE — ED Triage Notes (Signed)
The patient presented to the [facility] with a complaint of sinus pain and pressure x 3 weeks. 

## 2017-06-03 NOTE — ED Provider Notes (Signed)
CSN: [id number]     Arrival date & time 06/03/17  1346 History   First MD Initiated Contact with Patient 06/03/17 1516     Chief Complaint  Patient presents with  . Facial Pain   (Consider location/radiation/quality/duration/timing/severity/associated sxs/prior Treatment) 51 year old female presents with a complaint of "I have a sinus infection". She is complaining of a stuffy nose and headache for 3 weeks. Denies fever or chills. That she has had sinus surgery in the past. She has yellowish tint to her mucus when blowing her nose. She has tried pseudoephedrine and phenylephrine decongestant and Zyrtec and saline spray. None of these tend to help much.      Past Medical History:  Diagnosis Date  . Anxiety   . Asthma   . Depression   . Esophageal stricture   . Migraines   . OCD (obsessive compulsive disorder)   . Plantar fasciitis of right foot   . RLS (restless legs syndrome)   . Vertigo    Past Surgical History:  Procedure Laterality Date  . ESOPHAGOGASTRODUODENOSCOPY N/A 02/09/2015   Procedure: ESOPHAGOGASTRODUODENOSCOPY (EGD);  Surgeon: Inda Castle, MD;  Location: [facility];  Service: Endoscopy;  Laterality: N/A;  with dilation  . FOOT SURGERY    . NASAL SINUS SURGERY    . PLANTAR FASCIA RELEASE Right 08/03/2016   Procedure: PLANTAR FASCIA RELEASE WITH BONE SPUR EXCISION;  Surgeon: Dorna Leitz, MD;  Location: [facility];  Service: Orthopedics;  Laterality: Right;   Family History  Problem Relation Age of Onset  . Pancreatic cancer Father   . Diabetes Mother   . Heart disease Mother   . Anxiety disorder Brother   . Diabetes Paternal Grandmother   . Esophageal cancer Neg Hx   . Stomach cancer Neg Hx    Social History  Substance Use Topics  . Smoking status: Never Smoker  . Smokeless tobacco: Never Used  . Alcohol use Yes     Comment: socially   OB History    No data available     Review of Systems  Constitutional: Negative for chills,  fatigue and fever.  HENT: Positive for congestion, postnasal drip, rhinorrhea and sinus pressure. Negative for facial swelling, sore throat and tinnitus.   Eyes: Negative.   Respiratory: Negative.  Negative for cough and choking.   Cardiovascular: Negative.   Musculoskeletal: Negative for neck pain and neck stiffness.  Skin: Negative for pallor and rash.  Neurological: Negative.   All other systems reviewed and are negative.   Allergies  Azithromycin; Clindamycin/lincomycin; Penicillins; Sulfa antibiotics; and Vicodin [hydrocodone-acetaminophen]  Home Medications   Prior to Admission medications   Medication Sig Start Date End Date Taking? Authorizing Provider  albuterol (PROVENTIL HFA;VENTOLIN HFA) 108 (90 Base) MCG/ACT inhaler Inhale into the lungs every 6 (six) hours as needed for wheezing or shortness of breath.    [provider]  amitriptyline (ELAVIL) 25 MG tablet Take 25 mg by mouth at bedtime.    [provider]  clonazePAM (KLONOPIN) 0.5 MG tablet Take 0.5 mg by mouth 2 (two) times daily.    [provider]  docusate sodium (COLACE) 100 MG capsule Take 1 capsule (100 mg total) by mouth 2 (two) times daily. 08/03/16   Gary Fleet, PA-C  FLUoxetine (PROZAC) 40 MG capsule Take 40 mg by mouth daily.     [provider]  LINZESS 72 MCG capsule TAKE 1 CAPSULE BY MOUTH DAILY BEFORE BREAKFAST 05/23/17   Scot Jun, FNP  meclizine (  ANTIVERT) 12.5 MG tablet Take 12.5 mg by mouth 3 (three) times daily as needed for dizziness. Reported on 03/19/2016    [provider]  ondansetron (ZOFRAN) 4 MG tablet Take 1 tablet (4 mg total) by mouth every 6 (six) hours as needed for nausea or vomiting. 08/03/16   Gary Fleet, PA-C  oxyCODONE-acetaminophen (PERCOCET/ROXICET) 5-325 MG tablet Take 1-2 tablets by mouth every 4 (four) hours as needed for severe pain. 08/03/16   Gary Fleet, PA-C  predniSONE (DELTASONE) 50 MG tablet 1 tab po daily for 6  days. Take with food. Start 06/04/17. 06/03/17   Janne Napoleon, NP  QUEtiapine (SEROQUEL) 50 MG tablet Take 50 mg by mouth at bedtime.    [provider]  traZODone (DESYREL) 50 MG tablet Take 50 mg by mouth at bedtime.    [provider]   Meds Ordered and Administered this Visit   Medications  triamcinolone acetonide (KENALOG-40) injection 40 mg (not administered)    BP 119/77 (BP Location: Right Arm)   Pulse 96   Temp 98.2 F (36.8 C) (Oral)   Resp 18   SpO2 98%  No data found.   Physical Exam  Constitutional: She is oriented to person, place, and time. She appears well-developed and well-nourished. No distress.  HENT:  Mouth/Throat: No oropharyngeal exudate.  Bilateral TMs are mildly retracted. Oropharynx is dry with minimal streaky erythema otherwise appearing normal. No exudates or swelling. She does note that she had her tonsils removed a few months ago.  Neck: Normal range of motion. Neck supple.  Cardiovascular: Normal rate, regular rhythm and normal heart sounds.   Pulmonary/Chest: Effort normal and breath sounds normal. No respiratory distress. She has no wheezes.  Musculoskeletal: Normal range of motion. She exhibits no edema.  Lymphadenopathy:    She has no cervical adenopathy.  Neurological: She is alert and oriented to person, place, and time.  Skin: Skin is warm and dry. No rash noted.  Psychiatric: She has a normal mood and affect.  Nursing note and vitals reviewed.   Urgent Care Course     Procedures (including critical care time)  Labs Review Labs Reviewed - No data to display  Imaging Review No results found.   Visual Acuity Review  Right Eye Distance:   Left Eye Distance:   Bilateral Distance:    Right Eye Near:   Left Eye Near:    Bilateral Near:         MDM   1. Allergic sinusitis   2. Seasonal allergic rhinitis due to pollen    Take the prednisone as directed, start tomorrow. Continue taking the Zyrtec or you may  wish to change to Allegra if you have been taking the Zyrtec prolonged. Of time. Continue using saline nasal spray often. Continue taking decongestant and Flonase. Follow-up with your primary care doctor or ENT if not getting better. Drink plenty of fluids and stay well-hydrated. You may want to try plain Robitussin which is guaifenesin to help loosen secretions so that they will drain from the sinuses. It is cheaper than Mucinex Meds ordered this encounter  Medications  . triamcinolone acetonide (KENALOG-40) injection 40 mg  . predniSONE (DELTASONE) 50 MG tablet    Sig: 1 tab po daily for 6 days. Take with food. Start 06/04/17.    Dispense:  6 tablet    Refill:  0    Order Specific Question:   Supervising Provider    Answer:   Robyn Haber [phone]  Janne Napoleon, NP 06/03/17 1536    Robyn Haber, MD 06/03/17 1556

## 2017-06-03 NOTE — Discharge Instructions (Signed)
Take the prednisone as directed, start tomorrow. Continue taking the Zyrtec or you may wish to change to Allegra if you have been taking the Zyrtec prolonged. Of time. Continue using saline nasal spray often. Continue taking decongestant and Flonase. Follow-up with your primary care doctor or ENT if not getting better. Drink plenty of fluids and stay well-hydrated. You may want to try plain Robitussin which is guaifenesin to help loosen secretions so that they will drain from the sinuses. It is cheaper than Mucinex

## 2017-06-24 ENCOUNTER — Ambulatory Visit: Payer: Medicaid Other | Admitting: Family Medicine

## 2017-06-27 ENCOUNTER — Emergency Department (HOSPITAL_COMMUNITY)
Admission: EM | Admit: 2017-06-27 | Discharge: 2017-06-28 | Disposition: A | Discharge status: Other Acute Inpatient Hospital | Payer: Medicaid Other | Attending: Emergency Medicine | Admitting: Emergency Medicine

## 2017-06-27 ENCOUNTER — Encounter (HOSPITAL_COMMUNITY): Payer: Self-pay | Admitting: Emergency Medicine

## 2017-06-27 ENCOUNTER — Ambulatory Visit (HOSPITAL_COMMUNITY)
Admission: RE | Admit: 2017-06-27 | Discharge: 2017-06-27 | Disposition: A | Discharge status: Home / Self Care | Payer: Medicaid Other | Attending: Psychiatry | Admitting: Psychiatry

## 2017-06-27 DIAGNOSIS — Z880 Allergy status to penicillin: Secondary | ICD-10-CM | POA: Insufficient documentation

## 2017-06-27 DIAGNOSIS — Z881 Allergy status to other antibiotic agents status: Secondary | ICD-10-CM | POA: Insufficient documentation

## 2017-06-27 DIAGNOSIS — Z882 Allergy status to sulfonamides status: Secondary | ICD-10-CM | POA: Insufficient documentation

## 2017-06-27 DIAGNOSIS — F22 Delusional disorders: Secondary | ICD-10-CM | POA: Insufficient documentation

## 2017-06-27 DIAGNOSIS — Z833 Family history of diabetes mellitus: Secondary | ICD-10-CM | POA: Insufficient documentation

## 2017-06-27 DIAGNOSIS — R440 Auditory hallucinations: Secondary | ICD-10-CM | POA: Insufficient documentation

## 2017-06-27 DIAGNOSIS — Z818 Family history of other mental and behavioral disorders: Secondary | ICD-10-CM | POA: Diagnosis not present

## 2017-06-27 DIAGNOSIS — F329 Major depressive disorder, single episode, unspecified: Secondary | ICD-10-CM | POA: Diagnosis not present

## 2017-06-27 DIAGNOSIS — F3181 Bipolar II disorder: Secondary | ICD-10-CM | POA: Insufficient documentation

## 2017-06-27 DIAGNOSIS — F29 Unspecified psychosis not due to a substance or known physiological condition: Secondary | ICD-10-CM | POA: Diagnosis present

## 2017-06-27 DIAGNOSIS — F4481 Dissociative identity disorder: Secondary | ICD-10-CM | POA: Insufficient documentation

## 2017-06-27 DIAGNOSIS — Z8249 Family history of ischemic heart disease and other diseases of the circulatory system: Secondary | ICD-10-CM | POA: Diagnosis not present

## 2017-06-27 DIAGNOSIS — F429 Obsessive-compulsive disorder, unspecified: Secondary | ICD-10-CM | POA: Diagnosis present

## 2017-06-27 DIAGNOSIS — Z885 Allergy status to narcotic agent status: Secondary | ICD-10-CM | POA: Diagnosis not present

## 2017-06-27 DIAGNOSIS — G2581 Restless legs syndrome: Secondary | ICD-10-CM | POA: Diagnosis not present

## 2017-06-27 DIAGNOSIS — Z008 Encounter for other general examination: Secondary | ICD-10-CM

## 2017-06-27 DIAGNOSIS — J45909 Unspecified asthma, uncomplicated: Secondary | ICD-10-CM | POA: Insufficient documentation

## 2017-06-27 DIAGNOSIS — Z800 Family history of malignant neoplasm of digestive organs: Secondary | ICD-10-CM | POA: Insufficient documentation

## 2017-06-27 DIAGNOSIS — F419 Anxiety disorder, unspecified: Secondary | ICD-10-CM | POA: Insufficient documentation

## 2017-06-27 DIAGNOSIS — R45851 Suicidal ideations: Secondary | ICD-10-CM | POA: Insufficient documentation

## 2017-06-27 DIAGNOSIS — F331 Major depressive disorder, recurrent, moderate: Secondary | ICD-10-CM | POA: Diagnosis present

## 2017-06-27 DIAGNOSIS — R45850 Homicidal ideations: Secondary | ICD-10-CM | POA: Insufficient documentation

## 2017-06-27 LAB — COMPREHENSIVE METABOLIC PANEL
ALBUMIN: 4 g/dL (ref 3.5–5.0)
ALK PHOS: 87 U/L (ref 38–126)
ALT: 23 U/L (ref 14–54)
AST: 20 U/L (ref 15–41)
Anion gap: 6 (ref 5–15)
BUN: 11 mg/dL (ref 6–20)
CALCIUM: 8.8 mg/dL — AB (ref 8.9–10.3)
CO2: 28 mmol/L (ref 22–32)
CREATININE: 0.86 mg/dL (ref 0.44–1.00)
Chloride: 103 mmol/L (ref 101–111)
GFR calc Af Amer: >60 mL/min (ref >60)
GFR calc non Af Amer: >60 mL/min (ref >60)
GLUCOSE: 101 mg/dL — AB (ref 65–99)
Potassium: 4.3 mmol/L (ref 3.5–5.1)
SODIUM: 137 mmol/L (ref 135–145)
Total Bilirubin: 0.3 mg/dL (ref 0.3–1.2)
Total Protein: 7.4 g/dL (ref 6.5–8.1)

## 2017-06-27 LAB — CBC WITH DIFFERENTIAL/PLATELET
BASOS PCT: 0 %
Basophils Absolute: 0 10*3/uL (ref 0.0–0.1)
EOS ABS: 0.1 10*3/uL (ref 0.0–0.7)
Eosinophils Relative: 2 %
HCT: 37.9 % (ref 36.0–46.0)
HEMOGLOBIN: 12.7 g/dL (ref 12.0–15.0)
LYMPHS ABS: 2.4 10*3/uL (ref 0.7–4.0)
Lymphocytes Relative: 39 %
MCH: 29.6 pg (ref 26.0–34.0)
MCHC: 33.5 g/dL (ref 30.0–36.0)
MCV: 88.3 fL (ref 78.0–100.0)
Monocytes Absolute: 0.5 10*3/uL (ref 0.1–1.0)
Monocytes Relative: 8 %
NEUTROS ABS: 3 10*3/uL (ref 1.7–7.7)
NEUTROS PCT: 51 %
Platelets: 269 10*3/uL (ref 150–400)
RBC: 4.29 MIL/uL (ref 3.87–5.11)
RDW: 13.9 % (ref 11.5–15.5)
WBC: 6 10*3/uL (ref 4.0–10.5)

## 2017-06-27 LAB — RAPID URINE DRUG SCREEN, HOSP PERFORMED
Amphetamines: NOT DETECTED
Barbiturates: NOT DETECTED
Benzodiazepines: NOT DETECTED
COCAINE: NOT DETECTED
OPIATES: NOT DETECTED
TETRAHYDROCANNABINOL: NOT DETECTED

## 2017-06-27 LAB — I-STAT BETA HCG BLOOD, ED (MC, WL, AP ONLY): I-stat hCG, quantitative: <5 m[IU]/mL (ref <5)

## 2017-06-27 LAB — ETHANOL

## 2017-06-27 LAB — ACETAMINOPHEN LEVEL

## 2017-06-27 LAB — SALICYLATE LEVEL

## 2017-06-27 MED ORDER — ALBUTEROL SULFATE HFA 108 (90 BASE) MCG/ACT IN AERS: 1.0000 | INHALATION_SPRAY | Freq: Four times a day (QID) | RESPIRATORY_TRACT | Status: DC | PRN

## 2017-06-27 MED ORDER — IBUPROFEN 200 MG PO TABS: 600.0000 mg | ORAL_TABLET | Freq: Three times a day (TID) | ORAL | Status: DC | PRN

## 2017-06-27 MED ORDER — HYDROXYZINE HCL 25 MG PO TABS
50.0000 mg | ORAL_TABLET | Freq: Four times a day (QID) | ORAL | Status: DC | PRN
  Administered 2017-06-27: 50 mg via ORAL
  Filled 2017-06-27: qty 2

## 2017-06-27 MED ORDER — AMITRIPTYLINE HCL 25 MG PO TABS: 25.0000 mg | ORAL_TABLET | Freq: Every evening | ORAL | Status: DC | PRN

## 2017-06-27 MED ORDER — ALUM & MAG HYDROXIDE-SIMETH 200-200-20 MG/5ML PO SUSP: 30.0000 mL | Freq: Four times a day (QID) | ORAL | Status: DC | PRN

## 2017-06-27 MED ORDER — ONDANSETRON HCL 4 MG PO TABS: 4.0000 mg | ORAL_TABLET | Freq: Three times a day (TID) | ORAL | Status: DC | PRN

## 2017-06-27 MED ORDER — CLONAZEPAM 0.5 MG PO TABS
0.5000 mg | ORAL_TABLET | Freq: Two times a day (BID) | ORAL | Status: DC
  Administered 2017-06-27: 0.5 mg via ORAL
  Filled 2017-06-27: qty 1

## 2017-06-27 MED ORDER — ZOLPIDEM TARTRATE 5 MG PO TABS: 5.0000 mg | ORAL_TABLET | Freq: Every evening | ORAL | Status: DC | PRN

## 2017-06-27 MED ORDER — TRAZODONE HCL 50 MG PO TABS
50.0000 mg | ORAL_TABLET | Freq: Every day | ORAL | Status: DC
  Administered 2017-06-27: 50 mg via ORAL
  Filled 2017-06-27: qty 1

## 2017-06-27 MED ORDER — OXYCODONE-ACETAMINOPHEN 5-325 MG PO TABS
1.0000 | ORAL_TABLET | ORAL | Status: DC | PRN
  Administered 2017-06-27: 1 via ORAL
  Filled 2017-06-27: qty 1

## 2017-06-27 MED ORDER — LINACLOTIDE 72 MCG PO CAPS
72.0000 ug | ORAL_CAPSULE | Freq: Every day | ORAL | Status: DC
  Administered 2017-06-28: 72 ug via ORAL
  Filled 2017-06-27 (×2): qty 1

## 2017-06-27 MED ORDER — FLUOXETINE HCL 20 MG PO CAPS
40.0000 mg | ORAL_CAPSULE | Freq: Every day | ORAL | Status: DC
  Administered 2017-06-27 – 2017-06-28 (×2): 40 mg via ORAL
  Filled 2017-06-27 (×2): qty 2

## 2017-06-27 NOTE — ED Provider Notes (Signed)
[place] DEPT Provider Note   CSN: [id number] Arrival date & time: 06/27/17  1229     History   Chief Complaint No chief complaint on file.   HPI Stephanie Serrano is a 51 y.o. female with a PMHx of migraines, depression, anxiety, OCD, restless legs, vertigo, insomnia, HLD, and prediabetes, who presents to the ED from [facility] for medical clearance. Per chart review, pt was seen at [facility] just prior to arrival, and Stephanie Serrano saw the pt, and wrote: Stephanie Serrano is an 51 y.o. female. 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 Pt states that she has 2 identities Stephanie Serrano and Stephanie Serrano. Per Pt Stephanie Serrano makes her want to harm herself and others. Pt states that Stephanie Serrano does not like when people are "mean to her." Pt states that she likes Stephanie Serrano and Stephanie Serrano because they are nice to her. According to the Pt, she went to [place] to visit her family last week and "stuff came up." The Pt states when she returned home Stephanie Serrano and Stephanie Serrano "appeared." Pt denies previous experiences with Stephanie Serrano and Stephanie Serrano. The Pt states she is receiving outpatient therapy from Stephanie Serrano and Stephanie Serrano. Pt states she is prescribed Prozac by Stephanie Serrano. Pt denies SA and abuse. Pt displayed bizarre behavior hroughout the assessment. The Pt spoke in a baby voice. The Pt cried throughout the assessment. Stephanie Grizzle, NP recommends inpatient treatment. 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  The patient states that the above information is correct, reporting that she came back from [facility] on Fri and then on Saturday is when she started having multiple personalities; states that Stephanie Serrano speaks for her and that Stephanie Serrano is angry one who wants to hurt anyone who wants to hurt the patient. She does not elaborate whether there are homicidal thoughts or just thoughts of hurting people, and she has no plan. She states that Stephanie Serrano has self-deprecating thoughts and tells her that she is worthless. She endorses  suicidal thoughts with no plan. She states that she has been having difficulty sleeping. She reports that the multiple personalities are her head talking to each other and herself, but she denies visual hallucinations. She denies drug use, alcohol use, or cigarette smoking. She is compliant with her medications including Klonopin 0.5 mg BID, Prozac 40 mg QD, and Trazodone 50mg  QHS, all of which she took yesterday. She doesn't like Elavil 25mg  that she's supposed to take at night every night, but she states it makes food taste weird and crave sweets so she only takes it PRN, last taken last week sometime. Denies any medical complaints at this time. She is here voluntarily.    The history is provided by the patient and medical records. No language interpreter was used.  Mental Health Problem  Presenting symptoms: bizarre behavior, delusional, hallucinations, homicidal ideas and suicidal thoughts   Degree of incapacity (severity):  Moderate Onset quality:  Sudden Duration:  5 days Timing:  Constant Progression:  Worsening Chronicity:  New Treatment compliance:  All of the time Time since last psychoactive medication taken:  1 day Relieved by:  None tried Worsened by:  Nothing Ineffective treatments:  None tried Associated symptoms: no abdominal pain and no chest pain   Risk factors: hx of mental illness   Risk factors: no recent psychiatric admission     Past Medical History:  Diagnosis Date  . Anxiety   . Asthma   . Depression   . Esophageal stricture   .  Migraines   . OCD (obsessive compulsive disorder)   . Plantar fasciitis of right foot   . RLS (restless legs syndrome)   . Vertigo     Patient Active Problem List   Diagnosis Date Noted  . OCD (obsessive compulsive disorder) 03/26/2017  . Insomnia 02/08/2016  . Heel spur 09/01/2015  . Family history of cystitis 08/31/2015  . Left foot pain 08/31/2015  . Prediabetes 08/23/2015  . Hyperlipidemia 08/23/2015  . Depression  03/21/2015  . Acute esophagitis 02/09/2015  . Gastroesophageal reflux disease without esophagitis 08/27/2014  . Dysphagia, unspecified(787.20) 03/22/2014    Past Surgical History:  Procedure Laterality Date  . ESOPHAGOGASTRODUODENOSCOPY N/A 02/09/2015   Procedure: ESOPHAGOGASTRODUODENOSCOPY (EGD);  Surgeon: Inda Castle, MD;  Location: [facility];  Service: Endoscopy;  Laterality: N/A;  with dilation  . FOOT SURGERY    . NASAL SINUS SURGERY    . PLANTAR FASCIA RELEASE Right 08/03/2016   Procedure: PLANTAR FASCIA RELEASE WITH BONE SPUR EXCISION;  Surgeon: Dorna Leitz, MD;  Location: [facility];  Service: Orthopedics;  Laterality: Right;    OB History    No data available       Home Medications    Prior to Admission medications   Medication Sig Start Date End Date Taking? Authorizing Provider  albuterol (PROVENTIL HFA;VENTOLIN HFA) 108 (90 Base) MCG/ACT inhaler Inhale into the lungs every 6 (six) hours as needed for wheezing or shortness of breath.    [provider]  amitriptyline (ELAVIL) 25 MG tablet Take 25 mg by mouth at bedtime.    [provider]  clonazePAM (KLONOPIN) 0.5 MG tablet Take 0.5 mg by mouth 2 (two) times daily.    [provider]  docusate sodium (COLACE) 100 MG capsule Take 1 capsule (100 mg total) by mouth 2 (two) times daily. 08/03/16   Gary Fleet, PA-C  FLUoxetine (PROZAC) 40 MG capsule Take 40 mg by mouth daily.     [provider]  LINZESS 72 MCG capsule TAKE 1 CAPSULE BY MOUTH DAILY BEFORE BREAKFAST 05/23/17   Scot Jun, FNP  meclizine (ANTIVERT) 12.5 MG tablet Take 12.5 mg by mouth 3 (three) times daily as needed for dizziness. Reported on 03/19/2016    [provider]  ondansetron (ZOFRAN) 4 MG tablet Take 1 tablet (4 mg total) by mouth every 6 (six) hours as needed for nausea or vomiting. 08/03/16   Gary Fleet, PA-C  oxyCODONE-acetaminophen (PERCOCET/ROXICET) 5-325 MG tablet Take  1-2 tablets by mouth every 4 (four) hours as needed for severe pain. 08/03/16   Gary Fleet, PA-C  predniSONE (DELTASONE) 50 MG tablet 1 tab po daily for 6 days. Take with food. Start 06/04/17. 06/03/17   Janne Napoleon, NP  QUEtiapine (SEROQUEL) 50 MG tablet Take 50 mg by mouth at bedtime.    [provider]  traZODone (DESYREL) 50 MG tablet Take 50 mg by mouth at bedtime.    [provider]    Family History Family History  Problem Relation Age of Onset  . Pancreatic cancer Father   . Diabetes Mother   . Heart disease Mother   . Anxiety disorder Brother   . Diabetes Paternal Grandmother   . Esophageal cancer Neg Hx   . Stomach cancer Neg Hx     Social History Social History  Substance Use Topics  . Smoking status: Never Smoker  . Smokeless tobacco: Never Used  . Alcohol use Yes     Comment: socially  Allergies   Azithromycin; Clindamycin/lincomycin; Penicillins; Sulfa antibiotics; and Vicodin [hydrocodone-acetaminophen]   Review of Systems Review of Systems  Constitutional: Negative for chills and fever.  Respiratory: Negative for shortness of breath.   Cardiovascular: Negative for chest pain.  Gastrointestinal: Negative for abdominal pain, constipation, diarrhea, nausea and vomiting.  Genitourinary: Negative for dysuria and hematuria.  Musculoskeletal: Negative for arthralgias and myalgias.  Skin: Negative for color change.  Allergic/Immunologic: Negative for immunocompromised state.  Neurological: Negative for weakness and numbness.  Psychiatric/Behavioral: Positive for hallucinations, homicidal ideas, sleep disturbance and suicidal ideas.       +multiple personalities +self-deprecating thoughts   All other systems reviewed and are negative for acute change except as noted in the HPI.    Physical Exam Updated Vital Signs BP 128/78 (BP Location: Left Arm)   Pulse 75   Temp 98.2 F (36.8 C) (Oral)   Resp 16   SpO2 100%   Physical Exam    Constitutional: She is oriented to person, place, and time. Vital signs are normal. She appears well-developed and well-nourished.  Non-toxic appearance. No distress.  Afebrile, nontoxic, NAD although speaks in a child-like voice, somewhat odd behavior  HENT:  Head: Normocephalic and atraumatic.  Mouth/Throat: Oropharynx is clear and moist and mucous membranes are normal.  Eyes: Conjunctivae and EOM are normal. Right eye exhibits no discharge. Left eye exhibits no discharge.  Neck: Normal range of motion. Neck supple.  Cardiovascular: Normal rate, regular rhythm, normal heart sounds and intact distal pulses.  Exam reveals no gallop and no friction rub.   No murmur heard. Pulmonary/Chest: Effort normal and breath sounds normal. No respiratory distress. She has no decreased breath sounds. She has no wheezes. She has no rhonchi. She has no rales.  Abdominal: Soft. Normal appearance and bowel sounds are normal. She exhibits no distension. There is no tenderness. There is no rigidity, no rebound, no guarding, no CVA tenderness, no tenderness at McBurney's point and negative Murphy's sign.  Musculoskeletal: Normal range of motion.  Neurological: She is alert and oriented to person, place, and time. She has normal strength. No sensory deficit.  Skin: Skin is warm, dry and intact. No rash noted.  Psychiatric: Her mood appears anxious. She is actively hallucinating. Thought content is delusional. She expresses homicidal and suicidal ideation. She expresses no suicidal plans and no homicidal plans.  Odd behavior, speaking in child-like voice, stating she's speaking from one of her personalities; anxious affect, but overall still pleasant and cooperative. Endorsing SI without a plan, reports some HI thoughts in that she states one of her personalities wants to hurt people that hurts her, but doesn't clarify if it's actually homicidal thoughts. Appears to be responding to internal stimuli, auditory  hallucinations/delusions of multiple personalities. Denies visual hallucinations, and doesn't seem distracted by any internal stimuli to signal possible visual hallucinations   Nursing note and vitals reviewed.    ED Treatments / Results  Labs (all labs ordered are listed, but only abnormal results are displayed) Labs Reviewed  COMPREHENSIVE METABOLIC PANEL - Abnormal; Notable for the following:       Result Value   Glucose, Bld 101 (*)    Calcium 8.8 (*)    All other components within normal limits  ACETAMINOPHEN LEVEL - Abnormal; Notable for the following:    Acetaminophen (Tylenol), Serum <10 (*)    All other components within normal limits  CBC WITH DIFFERENTIAL/PLATELET  ETHANOL  SALICYLATE LEVEL  RAPID URINE DRUG SCREEN, HOSP PERFORMED  I-STAT BETA  HCG BLOOD, ED ([facility], [facility], [facility] ONLY)    EKG  EKG Interpretation None       Radiology No results found.  Procedures Procedures (including critical care time)  Medications Ordered in ED Medications  albuterol (PROVENTIL HFA;VENTOLIN HFA) 108 (90 Base) MCG/ACT inhaler 1 puff (not administered)  amitriptyline (ELAVIL) tablet 25 mg (not administered)  clonazePAM (KLONOPIN) tablet 0.5 mg (not administered)  FLUoxetine (PROZAC) capsule 40 mg (not administered)  linaclotide (LINZESS) capsule 72 mcg (not administered)  oxyCODONE-acetaminophen (PERCOCET/ROXICET) 5-325 MG per tablet 1-2 tablet (not administered)  traZODone (DESYREL) tablet 50 mg (not administered)  ibuprofen (ADVIL,MOTRIN) tablet 600 mg (not administered)  zolpidem (AMBIEN) tablet 5 mg (not administered)  ondansetron (ZOFRAN) tablet 4 mg (not administered)  alum & mag hydroxide-simeth (MAALOX/MYLANTA) 200-200-20 MG/5ML suspension 30 mL (not administered)     Initial Impression / Assessment and Plan / ED Course  I have reviewed the triage vital signs and the nursing notes.  Pertinent labs & imaging results that were available during my care of the patient were  reviewed by me and considered in my medical decision making (see chart for details).     51 y.o. female here for med clearance, sent by [facility] who has already assessed her when she went over there for c/o multiple personalities, SI, HI (somewhat, states one personality wants to hurt anyone who hurts the pt, but doesn't clarify if that's a homicidal thought or not). Having some delusions/auditory hallucinations. Speaks in a child like manner, stating that one of her personalities is the one speaking for the pt. Very odd behavior, but calm and cooperative with questioning. Denies VH, drugs/alcohol/smoking, or any medical complaints. Compliant with most of her medications, although she doesn't like her Elavil and doesn't want to take it regularly. Appears to have had some sort of acute psychotic break. Will get clearance labs and then likely finish medical clearance and prepare for her IP placement, which TTS recommended.   2:44 PM EtOH level undetectable. CBC w/diff WNL. CMP WNL. Salicylate and acetaminophen levels WNL. HCG neg. UDS negative. Pt medically cleared at this time. Psych hold orders and home med orders placed. Please see TTS notes for further documentation of care/dispo. PLEASE NOTE THAT PT IS HERE VOLUNTARILY AT THIS TIME, IF PT TRIES TO LEAVE THEY WOULD NEED IVC PAPERWORK TAKEN OUT. Pt stable at time of med clearance.     Final Clinical Impressions(s) / ED Diagnoses   Final diagnoses:  Suicidal ideation  Homicidal ideation  Multiple personalities  Auditory hallucinations  Delusions [facility])  Medical clearance for psychiatric admission    New Prescriptions New Prescriptions   No medications on [address] [place], [place] 06/27/17 1454    Mesner, Corene Cornea, MD 06/27/17 1709

## 2017-06-27 NOTE — ED Notes (Signed)
Pt oriented to room and unit.  Pt is pleasant and cooperative.  Pt denies S/I, H/I, and AVH.  Pt contracts for safety.  Pt was noted tearing paper towels into tiny pieces.  Pt is in no distress.  15 minute checks and video monitoring in place.

## 2017-06-27 NOTE — ED Notes (Signed)
Pt A&O x 3, no distress noted, cooperative, but complaining of anxiety.  Monitoring for safety, Q 15 min checks in effect.

## 2017-06-27 NOTE — ED Notes (Signed)
Wanded by security 

## 2017-06-27 NOTE — ED Triage Notes (Signed)
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 Pt states that she has 2 identities [name] and [name]. Per [name] makes her want to harm herself and others. Pt states that [name] does not like when people are "mean to her." Pt states that she likes [name] and [name] because they are nice to her. According to the Pt, she went to [place] to visit her family last week and "stuff came up." The Pt states when she returned home [name] and [name] "appeared." Pt denies previous experiences with [name] and [name]. The Pt states she is receiving outpatient therapy from Dr. Melina Copa and Valora Piccolo. Pt states she is prescribed Prozac by Dr. Melina Copa. Pt denies SA and abuse.Pt displayed bizarre behavior hroughout the assessment. The Pt spoke in a baby voice. The Pt cried throughout the assessment.

## 2017-06-27 NOTE — BH Assessment (Signed)
Tele Assessment Note   Stephanie Serrano is an 51 y.o. female. 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 Pt states that she has 2 identities Stephanie Serrano and Stephanie Serrano. Per Pt Stephanie Serrano makes her want to harm herself and others. Pt states that Stephanie Serrano does not like when people are "mean to her." Pt states that she likes Stephanie Serrano and Stephanie Serrano because they are nice to her. According to the Pt, she went to [place] to visit her family last week and "stuff came up." The Pt states when she returned home Stephanie Serrano and Stephanie Serrano "appeared." Pt denies previous experiences with Stephanie Serrano and Stephanie Serrano. The Pt states she is receiving outpatient therapy from Dr. Melina Copa and Valora Piccolo. Pt states she is prescribed Prozac by Dr. Melina Copa. Pt denies SA and abuse. Pt displayed bizarre behavior hroughout the assessment. The Pt spoke in a baby voice. The Pt cried throughout the assessment.   Margarita Grizzle, NP recommends inpatient treatment.   Diagnosis:  F06.2 Psychotic  disorder  Past Medical History:  Past Medical History:  Diagnosis Date  . Anxiety   . Asthma   . Depression   . Esophageal stricture   . Migraines   . OCD (obsessive compulsive disorder)   . Plantar fasciitis of right foot   . RLS (restless legs syndrome)   . Vertigo     Past Surgical History:  Procedure Laterality Date  . ESOPHAGOGASTRODUODENOSCOPY N/A 02/09/2015   Procedure: ESOPHAGOGASTRODUODENOSCOPY (EGD);  Surgeon: Stephanie Castle, MD;  Location: [facility];  Service: Endoscopy;  Laterality: N/A;  with dilation  . FOOT SURGERY    . NASAL SINUS SURGERY    . PLANTAR FASCIA RELEASE Right 08/03/2016   Procedure: PLANTAR FASCIA RELEASE WITH BONE SPUR EXCISION;  Surgeon: Stephanie Leitz, MD;  Location: [facility];  Service: Orthopedics;  Laterality: Right;    Family History:  Family History  Problem Relation Age of Onset  . Pancreatic cancer Father   . Diabetes Mother   . Heart disease Mother   .  Anxiety disorder Brother   . Diabetes Paternal Grandmother   . Esophageal cancer Neg Hx   . Stomach cancer Neg Hx     Social History:  reports that she has never smoked. She has never used smokeless tobacco. She reports that she drinks alcohol. She reports that she does not use drugs.  Additional Social History:  Alcohol / Drug Use Pain Medications: please see mar Prescriptions: please see mar Over the Counter: please see mar History of alcohol / drug use?: No history of alcohol / drug abuse Longest period of sobriety (when/how long): NA  CIWA:   COWS:    PATIENT STRENGTHS: (choose at least two) Capable of independent living Communication skills  Allergies:  Allergies  Allergen Reactions  . Azithromycin   . Clindamycin/Lincomycin   . Penicillins   . Sulfa Antibiotics   . Vicodin [Hydrocodone-Acetaminophen] Nausea Only    Home Medications:  (Not in a hospital admission)  OB/GYN Status:  No LMP recorded.  General Assessment Data Location of Assessment: [facility] TTS Assessment: In system Is this a Tele or Face-to-Face Assessment?: Face-to-Face Is this an Initial Assessment or a Re-assessment for this encounter?: Initial Assessment Marital status: Divorced [place] name: NA Is patient pregnant?: No Pregnancy Status: No Living Arrangements: Children Can pt return to current living arrangement?: Yes Admission Status: Voluntary Is patient capable of signing voluntary admission?: Yes Referral Source: Self/Family/Friend Google  type: [place]  Medical Screening Exam ([place]) Medical Exam completed: Yes  Crisis Care Plan Living Arrangements: Children Legal Guardian: Other: (self) Name of Psychiatrist: NA Name of Therapist: Daphene Serrano, Dr. Melina Copa  Education Status Is patient currently in school?: No Current Grade: NA Highest grade of school patient has completed: some college Name of school: NA Contact person: Na  Risk to self with  the past 6 months Suicidal Ideation: No-Not Currently/Within Last 6 Months Has patient been a risk to self within the past 6 months prior to admission? : Yes Suicidal Intent: No Has patient had any suicidal intent within the past 6 months prior to admission? : Yes Is patient at risk for suicide?: No Suicidal Plan?: No-Not Currently/Within Last 6 Months Has patient had any suicidal plan within the past 6 months prior to admission? : Yes Access to Means: Yes Specify Access to Suicidal Means: Pt states previously she had thoughts of walking into traffic What has been your use of drugs/alcohol within the last 12 months?: NA Previous Attempts/Gestures: No How many times?: 0 Other Self Harm Risks: NA Triggers for Past Attempts: None known Intentional Self Injurious Behavior: None Family Suicide History: No Recent stressful life event(s): Other (Comment) (unknown) Persecutory voices/beliefs?: No Depression: Yes Depression Symptoms: Tearfulness, Isolating, Loss of interest in usual pleasures, Feeling worthless/self pity, Feeling angry/irritable Substance abuse history and/or treatment for substance abuse?: No Suicide prevention information given to non-admitted patients: Not applicable  Risk to Others within the past 6 months Homicidal Ideation: No Does patient have any lifetime risk of violence toward others beyond the six months prior to admission? : No Thoughts of Harm to Others: No-Not Currently Present/Within Last 6 Months Current Homicidal Intent: No Current Homicidal Plan: No Access to Homicidal Means: No Identified Victim: NA History of harm to others?: No Assessment of Violence: None Noted Violent Behavior Description: NA Does patient have access to weapons?: No Criminal Charges Pending?: No Does patient have a court date: No Is patient on probation?: No  Psychosis Hallucinations: None noted Delusions: None noted  Mental Status Report Appearance/Hygiene: Bizarre Eye  Contact: Fair Motor Activity: Freedom of movement Speech: Rapid, Pressured Level of Consciousness: Alert Mood: Suspicious Affect: Anxious, Fearful Anxiety Level: Severe Thought Processes: Flight of Ideas, Tangential Judgement: Impaired Orientation: Person Obsessive Compulsive Thoughts/Behaviors: None  Cognitive Functioning Concentration: Normal Memory: Recent Intact, Remote Intact IQ: Average Insight: Poor Impulse Control: Poor Appetite: Fair Weight Loss: 0 Weight Gain: 0 Sleep: Decreased Total Hours of Sleep: 5 Vegetative Symptoms: None  ADLScreening [facility] Assessment Services) Patient's cognitive ability adequate to safely complete daily activities?: Yes Patient able to express need for assistance with ADLs?: Yes Independently performs ADLs?: Yes (appropriate for developmental age)  Prior Inpatient Therapy Prior Inpatient Therapy: No Prior Therapy Dates: NA Prior Therapy Facilty/Provider(s): NA Reason for Treatment: NA  Prior Outpatient Therapy Prior Outpatient Therapy: Yes Prior Therapy Dates: current Prior Therapy Facilty/Provider(s): Dr. Melina Copa and Valora Piccolo Reason for Treatment: OCD and anxiety Does patient have an ACCT team?: No Does patient have Intensive In-House Services?  : No Does patient have [facility] services? : No Does patient have P4CC services?: No  ADL Screening (condition at time of admission) Patient's cognitive ability adequate to safely complete daily activities?: Yes Is the patient deaf or have difficulty hearing?: No Does the patient have difficulty seeing, even when wearing glasses/contacts?: No Does the patient have difficulty concentrating, remembering, or making decisions?: No Patient able to express need for assistance with ADLs?: Yes  Does the patient have difficulty dressing or bathing?: No Independently performs ADLs?: Yes (appropriate for developmental age) Does the patient have difficulty walking or climbing stairs?: No Weakness  of Legs: None Weakness of Arms/Hands: None       Abuse/Neglect Assessment (Assessment to be complete while patient is alone) Physical Abuse: Denies Verbal Abuse: Denies Sexual Abuse: Denies Exploitation of patient/patient's resources: Denies Self-Neglect: Denies     Regulatory affairs officer (For Healthcare) Does Patient Have a Medical Advance Directive?: No    Additional Information 1:1 In Past 12 Months?: No CIRT Risk: No Elopement Risk: No Does patient have medical clearance?: Yes     Disposition:  Disposition Initial Assessment Completed for this Encounter: Yes Disposition of Patient: Inpatient treatment program Type of inpatient treatment program: Adult  Cyndia Bent 06/27/2017 12:39 PM

## 2017-06-27 NOTE — H&P (Signed)
Behavioral Health Medical Screening Exam  Stephanie Serrano is an 51 y.o. female.  Total Time spent with patient: 20 minutes  Psychiatric Specialty Exam: Physical Exam  Constitutional: She is oriented to person, place, and time. She appears well-developed and well-nourished.  HENT:  Head: Normocephalic.  Right Ear: External ear normal.  Left Ear: External ear normal.  Neck: Normal range of motion.  Cardiovascular: Normal rate, normal heart sounds and intact distal pulses.   Respiratory: Effort normal and breath sounds normal.  GI: Soft. Bowel sounds are normal.  Musculoskeletal: Normal range of motion.  Neurological: She is alert and oriented to person, place, and time.  Skin: Skin is warm and dry.  Psychiatric: Her mood appears anxious. Her affect is labile. Her speech is rapid and/or pressured and tangential. She is actively hallucinating. Thought content is paranoid and delusional. She expresses impulsivity. She exhibits abnormal recent memory.    Review of Systems  Psychiatric/Behavioral: Positive for depression, hallucinations and memory loss. Negative for substance abuse and suicidal ideas. The patient is nervous/anxious and has insomnia.     Blood pressure 138/90, pulse 91, temperature 98.6 F (37 C), temperature source Oral, resp. rate 18, SpO2 99 %.There is no height or weight on file to calculate BMI.  General Appearance: Disheveled  Eye Contact:  Fair  Speech:  Blocked and Pressured  Volume:  Normal  Mood:  Anxious, Depressed and Irritable  Affect:  Depressed, Labile, Restricted and Tearful  Thought Process:  Disorganized  Orientation:  Full (Time, Place, and Person)  Thought Content:  Illogical, Delusions and Tangential  Suicidal Thoughts:  No  Homicidal Thoughts:  No  Memory:  Immediate;   Fair Recent;   Fair Remote;   Fair  Judgement:  Poor  Insight:  Lacking  Psychomotor Activity:  Increased  Concentration: Concentration: Fair and Attention Span: Fair  Recall:   [employer] of Knowledge:Fair  Language: Good  Akathisia:  No  Handed:  Right  AIMS (if indicated):     Assets:  Financial Resources/Insurance Housing Physical Health Social Support  Sleep:       Musculoskeletal: Strength & Muscle Tone: within normal limits Gait & Station: normal Patient leans: N/A  Blood pressure 138/90, pulse 91, temperature 98.6 F (37 C), temperature source Oral, resp. rate 18, SpO2 99 %.  Recommendations:  Based on my evaluation the patient appears to have an emergency medical condition for which I recommend the patient be transferred to the emergency department for further evaluation.  Ethelene Hal, NP 06/27/2017, 1:15 PM

## 2017-06-28 ENCOUNTER — Telehealth (HOSPITAL_COMMUNITY): Payer: Self-pay | Admitting: Psychiatry

## 2017-06-28 ENCOUNTER — Inpatient Hospital Stay (HOSPITAL_COMMUNITY)
Admission: AD | Admit: 2017-06-28 | Discharge: 2017-07-02 | DRG: 885 | Disposition: A | Discharge status: Home / Self Care | Payer: Medicaid Other | Source: Intra-hospital | Attending: Psychiatry | Admitting: Psychiatry

## 2017-06-28 ENCOUNTER — Encounter (HOSPITAL_COMMUNITY): Payer: Self-pay

## 2017-06-28 DIAGNOSIS — F419 Anxiety disorder, unspecified: Secondary | ICD-10-CM | POA: Diagnosis present

## 2017-06-28 DIAGNOSIS — E785 Hyperlipidemia, unspecified: Secondary | ICD-10-CM | POA: Diagnosis present

## 2017-06-28 DIAGNOSIS — F329 Major depressive disorder, single episode, unspecified: Secondary | ICD-10-CM | POA: Diagnosis present

## 2017-06-28 DIAGNOSIS — F4481 Dissociative identity disorder: Secondary | ICD-10-CM | POA: Diagnosis present

## 2017-06-28 DIAGNOSIS — K219 Gastro-esophageal reflux disease without esophagitis: Secondary | ICD-10-CM | POA: Diagnosis present

## 2017-06-28 DIAGNOSIS — G4700 Insomnia, unspecified: Secondary | ICD-10-CM | POA: Diagnosis not present

## 2017-06-28 DIAGNOSIS — R45851 Suicidal ideations: Secondary | ICD-10-CM

## 2017-06-28 DIAGNOSIS — F331 Major depressive disorder, recurrent, moderate: Secondary | ICD-10-CM | POA: Diagnosis present

## 2017-06-28 DIAGNOSIS — F3181 Bipolar II disorder: Principal | ICD-10-CM | POA: Diagnosis present

## 2017-06-28 DIAGNOSIS — F429 Obsessive-compulsive disorder, unspecified: Secondary | ICD-10-CM | POA: Diagnosis present

## 2017-06-28 DIAGNOSIS — Z79899 Other long term (current) drug therapy: Secondary | ICD-10-CM

## 2017-06-28 DIAGNOSIS — F32A Depression, unspecified: Secondary | ICD-10-CM | POA: Diagnosis present

## 2017-06-28 DIAGNOSIS — R7303 Prediabetes: Secondary | ICD-10-CM | POA: Diagnosis present

## 2017-06-28 DIAGNOSIS — Z818 Family history of other mental and behavioral disorders: Secondary | ICD-10-CM

## 2017-06-28 DIAGNOSIS — J45909 Unspecified asthma, uncomplicated: Secondary | ICD-10-CM | POA: Diagnosis present

## 2017-06-28 DIAGNOSIS — F39 Unspecified mood [affective] disorder: Secondary | ICD-10-CM | POA: Diagnosis not present

## 2017-06-28 DIAGNOSIS — G2581 Restless legs syndrome: Secondary | ICD-10-CM | POA: Diagnosis present

## 2017-06-28 DIAGNOSIS — K5900 Constipation, unspecified: Secondary | ICD-10-CM

## 2017-06-28 MED ORDER — MAGNESIUM HYDROXIDE 400 MG/5ML PO SUSP: 30.0000 mL | Freq: Every day | ORAL | Status: DC | PRN

## 2017-06-28 MED ORDER — IBUPROFEN 600 MG PO TABS
600.0000 mg | ORAL_TABLET | Freq: Three times a day (TID) | ORAL | Status: DC | PRN
  Administered 2017-06-30 – 2017-07-01 (×2): 600 mg via ORAL
  Filled 2017-06-28 (×2): qty 1

## 2017-06-28 MED ORDER — TRAZODONE HCL 50 MG PO TABS
50.0000 mg | ORAL_TABLET | Freq: Every evening | ORAL | Status: DC | PRN
  Administered 2017-06-28 – 2017-07-01 (×4): 50 mg via ORAL
  Filled 2017-06-28 (×3): qty 1

## 2017-06-28 MED ORDER — ALUM & MAG HYDROXIDE-SIMETH 200-200-20 MG/5ML PO SUSP: 30.0000 mL | Freq: Four times a day (QID) | ORAL | Status: DC | PRN

## 2017-06-28 MED ORDER — FLUOXETINE HCL 20 MG PO CAPS
40.0000 mg | ORAL_CAPSULE | Freq: Every day | ORAL | Status: DC
  Administered 2017-06-29: 40 mg via ORAL
  Filled 2017-06-28 (×3): qty 2

## 2017-06-28 MED ORDER — LAMOTRIGINE 25 MG PO TABS
25.0000 mg | ORAL_TABLET | Freq: Every day | ORAL | Status: DC
  Administered 2017-06-28: 25 mg via ORAL
  Filled 2017-06-28: qty 1

## 2017-06-28 MED ORDER — LAMOTRIGINE 25 MG PO TABS
25.0000 mg | ORAL_TABLET | Freq: Every day | ORAL | Status: DC
  Administered 2017-06-29 – 2017-07-01 (×3): 25 mg via ORAL
  Filled 2017-06-28 (×4): qty 1

## 2017-06-28 MED ORDER — TRAZODONE HCL 50 MG PO TABS: 50.0000 mg | ORAL_TABLET | Freq: Every evening | ORAL | Status: DC | PRN

## 2017-06-28 MED ORDER — ALBUTEROL SULFATE HFA 108 (90 BASE) MCG/ACT IN AERS: 1.0000 | INHALATION_SPRAY | Freq: Four times a day (QID) | RESPIRATORY_TRACT | Status: DC | PRN

## 2017-06-28 MED ORDER — ONDANSETRON HCL 4 MG PO TABS: 4.0000 mg | ORAL_TABLET | Freq: Three times a day (TID) | ORAL | Status: DC | PRN

## 2017-06-28 MED ORDER — HYDROXYZINE HCL 50 MG PO TABS
50.0000 mg | ORAL_TABLET | Freq: Four times a day (QID) | ORAL | Status: DC | PRN
  Administered 2017-06-28 – 2017-06-30 (×3): 50 mg via ORAL
  Filled 2017-06-28 (×3): qty 1

## 2017-06-28 MED ORDER — LINACLOTIDE 72 MCG PO CAPS
72.0000 ug | ORAL_CAPSULE | Freq: Every day | ORAL | Status: DC
  Administered 2017-06-29 – 2017-07-02 (×4): 72 ug via ORAL
  Filled 2017-06-28 (×6): qty 1

## 2017-06-28 NOTE — ED Notes (Signed)
Patient denies pain and is resting comfortably.  

## 2017-06-28 NOTE — BH Assessment (Signed)
[facility] Assessment Progress Note  Dellia Nims, MEd calls from the [facility] to report that she received a voice mail from [facility] [phone]), this pt's therapist.  She asks to speak to the clinicians working with the pt to provide collateral information.  Pt has signed Consent to Release Information to this provider, and a notification call has been placed, which rolled to her voice mail.  Jalene Mullet, [place] Triage Specialist [phone]

## 2017-06-28 NOTE — BH Assessment (Signed)
[facility] Assessment Progress Note  Per Corena Pilgrim, MD, this pt requires psychiatric hospitalization at this time.  Leonia Reader, RN, [facility] has assigned pt to [facility] Rm [id number]; they will be ready to receive pt at 15:00.  Pt has signed Voluntary Admission and Consent for Treatment, as well as Consent to Release Information, and signed forms have been faxed to [facility].  Pt's nurse, Langley Gauss, has been notified, and agrees to send original paperwork along with pt via Betsy Pries, and to call report to [phone].  Jalene Mullet, [place] Triage Specialist [phone]

## 2017-06-28 NOTE — Social Work (Signed)
CSW received call from Tonette Bihari stating that patients outpatient therapist, Valora Piccolo [phone]), stating that she has collateral information regarding patient. Will need to determine appropriate next steps after speaking w patient, weekend CSW advised.  Edwyna Shell, LCSW Lead Clinical Social Worker Phone:  [phone]

## 2017-06-28 NOTE — Progress Notes (Signed)
06/28/17 1317:  LRT went to pt room to offer activities, pt was in bathroom.  Victorino Sparrow, LRT/CTRS

## 2017-06-28 NOTE — Consult Note (Signed)
[facility] Psychiatry Consult   Reason for Consult:  Psychiatric evaluation Referring Physician:  EDP Patient Identification: Stephanie Serrano MRN:  [id number] Principal Diagnosis: Bipolar 2 disorder [facility]) Diagnosis:   Patient Active Problem List   Diagnosis Date Noted  . Bipolar 2 disorder ([place]) [F31.81] 06/28/2017    Priority: High  . OCD (obsessive compulsive disorder) [F42.9] 03/26/2017  . Insomnia [G47.00] 02/08/2016  . Heel spur [M77.30] 09/01/2015  . Family history of cystitis [Z84.2] 08/31/2015  . Left foot pain [M79.672] 08/31/2015  . Prediabetes [R73.03] 08/23/2015  . Hyperlipidemia [E78.5] 08/23/2015  . Depression [F32.9] 03/21/2015  . Acute esophagitis [K20.9] 02/09/2015  . Gastroesophageal reflux disease without esophagitis [K21.9] 08/27/2014  . Dysphagia, unspecified(787.20) [R13.10] 03/22/2014    Total Time spent with patient: 45 minutes  Subjective:   Stephanie Serrano is a 51 y.o. female patient admitted with agitation and suicidal thoughts .  HPI:  Patient with history of Migraines, Depression, Anxiety, OCD, restless legs, vertigo, insomnia, HLD and prediabetes. Patient present extremely irritable, agitated and refusing to answer most of the question posed to her. However, patient report that she has 2 alter egos [name] and [name]. Patient states that she is being tormented by her alters, [name] has been making her think about hurting others and herself while Mini makes her speaks in baby voice and act bizarre. Currently, patient has mood swings and she is defiant and oppositional. She denies drugs and alcohol abuse.  Past Psychiatric History:  As above  Risk to Self: Is patient at risk for suicide?: Yes Risk to Others:   Prior Inpatient Therapy:   Prior Outpatient Therapy:    Past Medical History:  Past Medical History:  Diagnosis Date  . Anxiety   . Asthma   . Depression   . Esophageal stricture   . Migraines   . OCD (obsessive compulsive disorder)   .  Plantar fasciitis of right foot   . RLS (restless legs syndrome)   . Vertigo     Past Surgical History:  Procedure Laterality Date  . ESOPHAGOGASTRODUODENOSCOPY N/A 02/09/2015   Procedure: ESOPHAGOGASTRODUODENOSCOPY (EGD);  Surgeon: Inda Castle, MD;  Location: [facility];  Service: Endoscopy;  Laterality: N/A;  with dilation  . FOOT SURGERY    . NASAL SINUS SURGERY    . PLANTAR FASCIA RELEASE Right 08/03/2016   Procedure: PLANTAR FASCIA RELEASE WITH BONE SPUR EXCISION;  Surgeon: Dorna Leitz, MD;  Location: [facility];  Service: Orthopedics;  Laterality: Right;   Family History:  Family History  Problem Relation Age of Onset  . Pancreatic cancer Father   . Diabetes Mother   . Heart disease Mother   . Anxiety disorder Brother   . Diabetes Paternal Grandmother   . Esophageal cancer Neg Hx   . Stomach cancer Neg Hx    Family Psychiatric  History:  Social History:  History  Alcohol Use  . Yes    Comment: socially     History  Drug Use No    Social History   Social History  . Marital status: Divorced    Spouse name: N/A  . Number of children: 2  . Years of education: N/A   Occupational History  . [employer]   Social History Main Topics  . Smoking status: Never Smoker  . Smokeless tobacco: Never Used  . Alcohol use Yes     Comment: socially  . Drug use: No  . Sexual activity: No   Other Topics Concern  .  None   Social History Narrative  . None   Additional Social History:    Allergies:   Allergies  Allergen Reactions  . Azithromycin   . Clindamycin/Lincomycin   . Penicillins     Has patient had a PCN reaction causing immediate rash, facial/tongue/throat swelling, SOB or lightheadedness with hypotension: yes Has patient had a PCN reaction causing severe rash involving mucus membranes or skin necrosis: no Has patient had a PCN reaction that required hospitalization: no Has patient had a PCN reaction occurring within the last 10  years: no If all of the above answers are "NO", then may proceed with Cephalosporin use.   . Sulfa Antibiotics   . Vicodin [Hydrocodone-Acetaminophen] Nausea Only    Labs:  Results for orders placed or performed during the hospital encounter of 06/27/17 (from the past 48 hour(s))  Urine rapid drug screen (hosp performed)     Status: None   Collection Time: 06/27/17  1:19 PM  Result Value Ref Range   Opiates NONE DETECTED NONE DETECTED   Cocaine NONE DETECTED NONE DETECTED   Benzodiazepines NONE DETECTED NONE DETECTED   Amphetamines NONE DETECTED NONE DETECTED   Tetrahydrocannabinol NONE DETECTED NONE DETECTED   Barbiturates NONE DETECTED NONE DETECTED    Comment:        DRUG SCREEN FOR MEDICAL PURPOSES ONLY.  IF CONFIRMATION IS NEEDED FOR ANY PURPOSE, NOTIFY LAB WITHIN 5 DAYS.        LOWEST DETECTABLE LIMITS FOR URINE DRUG SCREEN Drug Class       Cutoff (ng/mL) Amphetamine      1000 Barbiturate      200 Benzodiazepine   268 Tricyclics       341 Opiates          300 Cocaine          300 THC              50   CBC w/diff     Status: None   Collection Time: 06/27/17  1:30 PM  Result Value Ref Range   WBC 6.0 4.0 - 10.5 K/uL   RBC 4.29 3.87 - 5.11 MIL/uL   Hemoglobin 12.7 12.0 - 15.0 g/dL   HCT 37.9 36.0 - 46.0 %   MCV 88.3 78.0 - 100.0 fL   MCH 29.6 26.0 - 34.0 pg   MCHC 33.5 30.0 - 36.0 g/dL   RDW 13.9 11.5 - 15.5 %   Platelets 269 150 - 400 K/uL   Neutrophils Relative % 51 %   Neutro Abs 3.0 1.7 - 7.7 K/uL   Lymphocytes Relative 39 %   Lymphs Abs 2.4 0.7 - 4.0 K/uL   Monocytes Relative 8 %   Monocytes Absolute 0.5 0.1 - 1.0 K/uL   Eosinophils Relative 2 %   Eosinophils Absolute 0.1 0.0 - 0.7 K/uL   Basophils Relative 0 %   Basophils Absolute 0.0 0.0 - 0.1 K/uL  Comprehensive metabolic panel     Status: Abnormal   Collection Time: 06/27/17  1:30 PM  Result Value Ref Range   Sodium 137 135 - 145 mmol/L   Potassium 4.3 3.5 - 5.1 mmol/L   Chloride 103 101 - 111  mmol/L   CO2 28 22 - 32 mmol/L   Glucose, Bld 101 (H) 65 - 99 mg/dL   BUN 11 6 - 20 mg/dL   Creatinine, Ser 0.86 0.44 - 1.00 mg/dL   Calcium 8.8 (L) 8.9 - 10.3 mg/dL   Total Protein 7.4 6.5 - 8.1  g/dL   Albumin 4.0 3.5 - 5.0 g/dL   AST 20 15 - 41 U/L   ALT 23 14 - 54 U/L   Alkaline Phosphatase 87 38 - 126 U/L   Total Bilirubin 0.3 0.3 - 1.2 mg/dL   GFR calc non Af Amer >60 >60 mL/min   GFR calc Af Amer >60 >60 mL/min    Comment: (NOTE) The eGFR has been calculated using the CKD EPI equation. This calculation has not been validated in all clinical situations. eGFR's persistently <60 mL/min signify possible Chronic Kidney Disease.    Anion gap 6 5 - 15  Ethanol     Status: None   Collection Time: 06/27/17  1:40 PM  Result Value Ref Range   Alcohol, Ethyl (B) <5 <5 mg/dL    Comment:        LOWEST DETECTABLE LIMIT FOR SERUM ALCOHOL IS 5 mg/dL FOR MEDICAL PURPOSES ONLY   Salicylate level     Status: None   Collection Time: 06/27/17  1:40 PM  Result Value Ref Range   Salicylate Lvl <5.8 2.8 - 30.0 mg/dL  Acetaminophen level     Status: Abnormal   Collection Time: 06/27/17  1:40 PM  Result Value Ref Range   Acetaminophen (Tylenol), Serum <10 (L) 10 - 30 ug/mL    Comment:        THERAPEUTIC CONCENTRATIONS VARY SIGNIFICANTLY. A RANGE OF 10-30 ug/mL MAY BE AN EFFECTIVE CONCENTRATION FOR MANY PATIENTS. HOWEVER, SOME ARE BEST TREATED AT CONCENTRATIONS OUTSIDE THIS RANGE. ACETAMINOPHEN CONCENTRATIONS >150 ug/mL AT 4 HOURS AFTER INGESTION AND >50 ug/mL AT 12 HOURS AFTER INGESTION ARE OFTEN ASSOCIATED WITH TOXIC REACTIONS.   I-Stat beta hCG blood, ED ([facility], [facility], [facility] only)     Status: None   Collection Time: 06/27/17  1:46 PM  Result Value Ref Range   I-stat hCG, quantitative <5.0 <5 mIU/mL   Comment 3            Comment:   GEST. AGE      CONC.  (mIU/mL)   <=1 WEEK        5 - 50     2 WEEKS       50 - 500     3 WEEKS       100 - 10,000     4 WEEKS     1,000 - 30,000         FEMALE AND NON-PREGNANT FEMALE:     LESS THAN 5 mIU/mL     Current Facility-Administered Medications  Medication Dose Route Frequency Provider Last Rate Last Dose  . albuterol (PROVENTIL HFA;VENTOLIN HFA) 108 (90 Base) MCG/ACT inhaler 1 puff  1 puff Inhalation Q6H PRN Street, [place], PA-C      . alum & mag hydroxide-simeth (MAALOX/MYLANTA) 200-200-20 MG/5ML suspension 30 mL  30 mL Oral Q6H PRN Street, [place], PA-C      . FLUoxetine (PROZAC) capsule 40 mg  40 mg Oral Daily [address] [place], [place]   40 mg at 06/28/17 5277  . hydrOXYzine (ATARAX/VISTARIL) tablet 50 mg  50 mg Oral Q6H PRN Patrecia Pour, NP   50 mg at 06/27/17 1944  . ibuprofen (ADVIL,MOTRIN) tablet 600 mg  600 mg Oral Q8H PRN Street, [place], [place]      . lamoTRIgine (LAMICTAL) tablet 25 mg  25 mg Oral Daily Vicke Plotner, MD      . linaclotide (LINZESS) capsule 72 mcg  72 mcg Oral QAC breakfast [address] [place], PA-C   72 mcg  at 06/28/17 0844  . ondansetron (ZOFRAN) tablet 4 mg  4 mg Oral Q8H PRN Street, [place], [place]      . traZODone (DESYREL) tablet 50 mg  50 mg Oral QHS PRN Corena Pilgrim, MD       Current Outpatient Prescriptions  Medication Sig Dispense Refill  . albuterol (PROVENTIL HFA;VENTOLIN HFA) 108 (90 Base) MCG/ACT inhaler Inhale into the lungs every 6 (six) hours as needed for wheezing or shortness of breath.    [name] amitriptyline (ELAVIL) 25 MG tablet Take 25 mg by mouth at bedtime as needed for sleep.     . clonazePAM (KLONOPIN) 0.5 MG tablet Take 0.5 mg by mouth 2 (two) times daily.    [name] FLUoxetine (PROZAC) 40 MG capsule Take 40 mg by mouth daily.     [name] LINZESS 72 MCG capsule TAKE 1 CAPSULE BY MOUTH DAILY BEFORE BREAKFAST 30 capsule 1  . ondansetron (ZOFRAN) 4 MG tablet Take 1 tablet (4 mg total) by mouth every 6 (six) hours as needed for nausea or vomiting. 30 tablet 0  . oxyCODONE-acetaminophen (PERCOCET/ROXICET) 5-325 MG tablet Take 1-2 tablets by mouth every 4 (four) hours as needed for severe pain.  60 tablet 0  . traZODone (DESYREL) 50 MG tablet Take 50 mg by mouth at bedtime.    . docusate sodium (COLACE) 100 MG capsule Take 1 capsule (100 mg total) by mouth 2 (two) times daily. (Patient not taking: Reported on 06/27/2017) 40 capsule 0  . predniSONE (DELTASONE) 50 MG tablet 1 tab po daily for 6 days. Take with food. Start 06/04/17. (Patient not taking: Reported on 06/27/2017) 6 tablet 0  . SHINGRIX injection Inject 1 Dose into the muscle once.  0    Musculoskeletal: Strength & Muscle Tone: within normal limits Gait & Station: normal Patient leans: N/A  Psychiatric Specialty Exam: Physical Exam  Psychiatric: Her affect is labile. Her speech is rapid and/or pressured and tangential. She is agitated and aggressive. Cognition and memory are normal. She expresses impulsivity. She exhibits a depressed mood. She expresses homicidal and suicidal ideation.    Review of Systems  Constitutional: Negative.   HENT: Negative.   Eyes: Negative.   Respiratory: Negative.   Cardiovascular: Negative.   Gastrointestinal: Negative.   Genitourinary: Negative.   Musculoskeletal: Negative.   Skin: Negative.   Neurological: Negative.   Endo/Heme/Allergies: Negative.   Psychiatric/Behavioral: Positive for depression and suicidal ideas. The patient is nervous/anxious.     Blood pressure 106/68, pulse 66, temperature 98 F (36.7 C), temperature source Oral, resp. rate 17, SpO2 99 %.There is no height or weight on file to calculate BMI.  General Appearance: Casual  Eye Contact:  Minimal  Speech:  Pressured  Volume:  Increased  Mood:  Irritable  Affect:  Labile  Thought Process:  Disorganized  Orientation:  Full (Time, Place, and Person)  Thought Content:  Illogical, Obsessions and Rumination  Suicidal Thoughts:  Yes.  without intent/plan  Homicidal Thoughts:  No  Memory:  Immediate;   Fair Recent;   Fair Remote;   Fair  Judgement:  Poor  Insight:  Lacking  Psychomotor Activity:  Restlessness   Concentration:  Concentration: Fair and Attention Span: Fair  Recall:  [employer] of Knowledge:  Fair  Language:  Good  Akathisia:  No  Handed:  Right  AIMS (if indicated):     Assets:  Communication Skills  ADL's:  Intact  Cognition:  WNL  Sleep:   poor     Treatment Plan  Summary: Daily contact with patient to assess and evaluate symptoms and progress in treatment and Medication management Continue Prozac 40 mg daily, start Lamictal 25 mg daily for mood stabilization.  Disposition: Recommend psychiatric Inpatient admission when medically cleared.  Corena Pilgrim, MD 06/28/2017 11:10 AM

## 2017-06-28 NOTE — Progress Notes (Signed)
Adult Psychoeducational Group Note  Date:  06/28/2017 Time:  8:47 PM  Group Topic/Focus:  Wrap-Up Group:   The focus of this group is to help patients review their daily goal of treatment and discuss progress on daily workbooks.  Participation Level:  Active  Participation Quality:  Appropriate  Affect:  Appropriate  Cognitive:  Appropriate  Insight: Appropriate  Engagement in Group:  Engaged  Modes of Intervention:  Discussion  Additional Comments: The patient that she recently arrived to [facility].The patient also said that she rates today 6.  Stephanie Serrano 06/28/2017, 8:47 PM

## 2017-06-28 NOTE — ED Notes (Signed)
Pt notes her therapy is Valora Piccolo [phone], she notes the doctor can call her to get more information, if he needs.

## 2017-06-28 NOTE — Plan of Care (Signed)
Problem: Activity: Goal: Interest or engagement in activities will improve Outcome: Progressing Pt. attended wrap-up group this evening.

## 2017-06-28 NOTE — ED Notes (Signed)
Pt A/O, no noted distress. Denies SI/HI. Pt stated, "did not sleep well, stayed awaken half of night and when did sleep, slept for a hour."  Pt states, "I take trazodone at home to help me sleep at night and klonopin." Pt went to [place] spent a week and a half, she has not visited her home in over 20 yrs. She took her daughter there because she wanted her daughter to see where she was from and to meet her relatives. The visit brought back memories of what happened to her there. She has been unable to suppressed and cope. No one in the family knows about her issues but her cousin. Her cousin advised her to check herself in the hospital. 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 Pt plan for inpatient services.

## 2017-06-28 NOTE — Telephone Encounter (Signed)
D:  Patient's therapist Eustaquio Maize [place], [place]) called this Probation officer and left vm that she's been trying to get in contact with someone to discuss her patient.  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  A:  Placed call to Psych ED, spoke to Tonette Bihari, [facility] and provided him with therapist phone number and recommended that he call her for collateral info.  R:  Gershon Mussel was receptive.   Dellia Nims, M.Ed, CNA

## 2017-06-28 NOTE — ED Notes (Signed)
Report given to [facility], RN unit ready to accept pt.

## 2017-06-28 NOTE — Tx Team (Signed)
Initial Treatment Plan 06/28/2017 6:58 PM [name] FEO:[id number]    PATIENT STRESSORS: Financial difficulties Health problems Traumatic event   PATIENT STRENGTHS: Communication skills General fund of knowledge Motivation for treatment/growth   PATIENT IDENTIFIED PROBLEMS: Anxiety  Psychosis  "Learn how to control one of the personalities"  "Make everything be calm and quiet again"               DISCHARGE CRITERIA:  Improved stabilization in mood, thinking, and/or behavior Verbal commitment to aftercare and medication compliance  PRELIMINARY DISCHARGE PLAN: Outpatient therapy Medication management  PATIENT/FAMILY INVOLVEMENT: This treatment plan has been presented to and reviewed with the patient, Stephanie Serrano.  The patient and family have been given the opportunity to ask questions and make suggestions.  Windell Moment, RN 06/28/2017, 6:58 PM

## 2017-06-28 NOTE — Progress Notes (Signed)
  DATA ACTION RESPONSE  Objective- Pt. is visible in the room, seen resting in bed with eyes open. Presents with a fearful/anxious affect and mood. Speech is soft in a childlike voice.  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 states she identifies [name] as the Public relations account executive [place].He is nice to her. Is cooperative and remain safe on the unit.  1:1 interaction in private to establish rapport. Encouragement, education, & support given from staff.  PRN Trazodone requested and will re-eval accordingly.   Safety maintained with Q 15 checks. Continue with POC.

## 2017-06-28 NOTE — Progress Notes (Signed)
Stephanie Serrano is a 51 year old female being admitted voluntarily to [facility] from [facility].  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  She is reporting two personalities and she has named them Stephanie Serrano and Stephanie Serrano.  She is currently seeing a therapist and is diagnosed with depression, anxiety and OCD.  She reported recent trip to [place] and things came up and that was when [name] and Stephanie Serrano appeared.  She denied any SI/HI.  She was noted during [facility] assessment that she was talking in a child's voice and was tearful.  She is diagnosed with Psychotic Disorder. She denies any pain or discomfort and appears to be in no physical distress. She denies SI/HI or A/V hallucinations.  She does talk about Stephanie Serrano and Stephanie Serrano through out the assessment.  Oriented her to the unit.  Admission paperwork completed and signed.  Belongings searched and secured in locker # 169.  Skin assessment completed and no skin issues noted.  Q 15 minute checks initiated for safety.  We will monitor the progress towards her goals.

## 2017-06-29 MED ORDER — FLUOXETINE HCL 20 MG PO CAPS
60.0000 mg | ORAL_CAPSULE | Freq: Every day | ORAL | Status: DC
  Administered 2017-06-30 – 2017-07-02 (×3): 60 mg via ORAL
  Filled 2017-06-29 (×4): qty 3

## 2017-06-29 MED ORDER — INSULIN ASPART 100 UNIT/ML ~~LOC~~ SOLN: 0.0000 [IU] | Freq: Three times a day (TID) | SUBCUTANEOUS | Status: DC

## 2017-06-29 MED ORDER — INSULIN ASPART 100 UNIT/ML ~~LOC~~ SOLN: 0.0000 [IU] | Freq: Every day | SUBCUTANEOUS | Status: DC

## 2017-06-29 MED ORDER — LOPERAMIDE HCL 2 MG PO CAPS: 2.0000 mg | ORAL_CAPSULE | ORAL | Status: DC | PRN

## 2017-06-29 NOTE — [facility] Group Notes (Signed)
[facility] Group Notes:  (Clinical Social Work)  06/29/2017  11:15-12:00PM  Summary of Progress/Problems:   Today's process group involved patients discussing their feelings related to being hospitalized, as well as benefits they see to being in the hospital.  The group brainstormed specific ways in which they could seek for those same benefits to happen when they discharge and go back home. The patient expressed a primary feeling about being hospitalized is hopeful that it will help her to be more functional.  She experienced her mother being in and out of institutions her whole childhood, so stated she realizes it can be both good and bad.  She talked about being triggered into PTSD recently on a trip back to where she grew up.  Type of Therapy:  Group Therapy - Process  Participation Level:  Active  Participation Quality:  Attentive and Sharing  Affect:  Blunted  Cognitive:  Appropriate  Insight:  Engaged  Engagement in Therapy:  Engaged  Modes of Intervention:  Exploration, Discussion  Selmer Dominion, LCSW 06/29/2017, 1:02 PM

## 2017-06-29 NOTE — Progress Notes (Signed)
Adult Psychoeducational Group Note  Date:  06/29/2017 Time:  8:49 PM  Group Topic/Focus:  Wrap-Up Group:   The focus of this group is to help patients review their daily goal of treatment and discuss progress on daily workbooks.  Participation Level:  Active  Participation Quality:  Appropriate  Affect:  Appropriate  Cognitive:  Appropriate  Insight: Appropriate  Engagement in Group:  Engaged  Modes of Intervention:  Discussion  Additional Comments: The patient expressed that she rates today a 9.The patient also said that in group they talk about positive thing about themselves and her was swimming.  Nash Shearer 06/29/2017, 8:49 PM

## 2017-06-29 NOTE — Progress Notes (Signed)
D: Pt presents with depressed affect and is fidgety on interactions, unable to be still. Denies SI, HI, AVH and pain when assessed. A & O X4. Rates her depression 4/10, hopelessness 2/10 and anxiety 9/10. Reports poor sleep from last night on self inventory sheet "because my room mate snores", however per observation sheet, pt slept approximately 6.5 hrs. Reports she's eating good with low energy and concentration level. A: Medications administered as ordered and effects monitored. Support and availability provided to pt. Encouraged pt to voice concerns, attend unit groups and scheduled activities. Routine safety checks maintained without outburst thus far.  R: Pt did not attend AM nursing group when prompted "I'm tired, I want to sleep". Tolerates medications, denies adverse drug reaction at this time. Remains safe on and off unit.
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
## 2017-06-29 NOTE — H&P (Signed)
Psychiatric Admission Assessment Adult  Patient Identification: Stephanie Serrano MRN:  [id number] Date of Evaluation:  06/29/2017 Chief Complaint:  psychotic disorder did by pt report Principal Diagnosis: Bipolar 2 disorder ([place]) Diagnosis:   Patient Active Problem List   Diagnosis Date Noted  . Bipolar 2 disorder ([place]) [F31.81] 06/28/2017  . OCD (obsessive compulsive disorder) [F42.9] 03/26/2017  . Insomnia [G47.00] 02/08/2016  . Heel spur [M77.30] 09/01/2015  . Family history of cystitis [Z84.2] 08/31/2015  . Left foot pain [M79.672] 08/31/2015  . Prediabetes [R73.03] 08/23/2015  . Hyperlipidemia [E78.5] 08/23/2015  . Depression [F32.9] 03/21/2015  . Acute esophagitis [K20.9] 02/09/2015  . Gastroesophageal reflux disease without esophagitis [K21.9] 08/27/2014  . Dysphagia, unspecified(787.20) [R13.10] 03/22/2014   History of Present Illness: Per HPI on emergancy note- Stephanie Serrano is a 50 y.o. female with a PMHx of migraines, depression, anxiety, OCD, restless legs, vertigo, insomnia, HLD, and prediabetes, who presents to the ED from [facility] for medical clearance. Per chart review, pt was seen at [facility] just prior to arrival, and Stephanie Serrano saw the pt, and wrote: Stephanie Serrano is an 51 y.o. female. 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 Pt states that she has 2 identities [name] and [name]. Per [name] makes her want to harm herself and others. Pt states that [name] does not like when people are "mean to her." Pt states that she likes [name] and [name] because they are nice to her. According to the Pt, she went to [place] to visit her family last week and "stuff came up." The Pt states when she returned home [name] and [name] "appeared." Pt denies previous experiences with [name] and [name]. The Pt states she is receiving outpatient therapy from Dr. Melina Copa and Valora Piccolo. Pt states she is prescribed Prozac by Dr. Melina Copa. Pt  denies SA and abuse. Pt displayed bizarre behavior hroughout the assessment. The Pt spoke in a baby voice. The Pt cried throughout the assessment. Margarita Grizzle, NP recommends inpatient treatment. 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 The patient states that the above information is correct, reporting that she came back from [address] on Fri and then on Saturday is when she started having multiple personalities; states that [name] speaks for her and that [name] is angry one who wants to hurt anyone who wants to hurt the patient. She does not elaborate whether there are homicidal thoughts or just thoughts of hurting people, and she has no plan. She states that [name] has self-deprecating thoughts and tells her that she is worthless. She endorses suicidal thoughts with no plan. She states that she has been having difficulty sleeping. She reports that the multiple personalities are her head talking to each other and herself, but she denies visual hallucinations. She denies drug use, alcohol use, or cigarette smoking. She is compliant with her medications including Klonopin 0.5 mg BID, Prozac 40 mg QD, and Trazodone 29m QHS, all of which she took yesterday. She doesn't like Elavil 281mthat she's supposed to take at night every night, but she states it makes food taste weird and crave sweets so she only takes it PRN, last taken last week sometime. Denies any medical complaints at this time. She is here voluntarily.   Associated Signs/Symptoms: Depression Symptoms:  depressed mood, difficulty concentrating, hopelessness, anxiety, (Hypo) Manic Symptoms:  Distractibility, Impulsivity, Irritable Mood, Labiality of Mood, Anxiety Symptoms:  Excessive Worry, Psychotic Symptoms:  Delusions, Paranoia, PTSD Symptoms: Avoidance:  None Total Time  spent with patient: 45 minutes  Past Psychiatric History:   Is the patient at risk to self? Yes.    Has the patient been a risk to self in the past 6 months? Yes.    Has the patient  been a risk to self within the distant past? Yes.    Is the patient a risk to others? No.  Has the patient been a risk to others in the past 6 months? Yes.    Has the patient been a risk to others within the distant past? No.   Prior Inpatient Therapy:   Prior Outpatient Therapy:    Alcohol Screening: 1. How often do you have a drink containing alcohol?: Monthly or less 2. How many drinks containing alcohol do you have on a typical day when you are drinking?: 1 or 2 3. How often do you have six or more drinks on one occasion?: Never Preliminary Score: 0 9. Have you or someone else been injured as a result of your drinking?: No 10. Has a relative or friend or a doctor or another health worker been concerned about your drinking or suggested you cut down?: No Alcohol Use Disorder Identification Test Final Score (AUDIT): 1 Brief Intervention: AUDIT score less than 7 or less-screening does not suggest unhealthy drinking-brief intervention not indicated Substance Abuse History in the last 12 months:  No. Consequences of Substance Abuse: NA Previous Psychotropic Medications: Yes  Psychological Evaluations: Yes  Past Medical History:  Past Medical History:  Diagnosis Date  . Anxiety   . Asthma   . Depression   . Esophageal stricture   . Migraines   . OCD (obsessive compulsive disorder)   . Plantar fasciitis of right foot   . RLS (restless legs syndrome)   . Vertigo     Past Surgical History:  Procedure Laterality Date  . ESOPHAGOGASTRODUODENOSCOPY N/A 02/09/2015   Procedure: ESOPHAGOGASTRODUODENOSCOPY (EGD);  Surgeon: Inda Castle, MD;  Location: [facility];  Service: Endoscopy;  Laterality: N/A;  with dilation  . FOOT SURGERY    . NASAL SINUS SURGERY    . PLANTAR FASCIA RELEASE Right 08/03/2016   Procedure: PLANTAR FASCIA RELEASE WITH BONE SPUR EXCISION;  Surgeon: Dorna Leitz, MD;  Location: [facility];  Service: Orthopedics;  Laterality: Right;   Family History:   Family History  Problem Relation Age of Onset  . Pancreatic cancer Father   . Diabetes Mother   . Heart disease Mother   . Anxiety disorder Brother   . Diabetes Paternal Grandmother   . Esophageal cancer Neg Hx   . Stomach cancer Neg Hx    Family Psychiatric  History: Tobacco Screening: Have you used any form of tobacco in the last 30 days? (Cigarettes, Smokeless Tobacco, Cigars, and/or Pipes): No Social History:  History  Alcohol Use  . Yes    Comment: socially     History  Drug Use No    Additional Social History:                           Allergies:   Allergies  Allergen Reactions  . Azithromycin   . Clindamycin/Lincomycin   . Penicillins     Has patient had a PCN reaction causing immediate rash, facial/tongue/throat swelling, SOB or lightheadedness with hypotension: yes Has patient had a PCN reaction causing severe rash involving mucus membranes or skin necrosis: no Has patient had a PCN reaction that required hospitalization: no Has patient had  a PCN reaction occurring within the last 10 years: no If all of the above answers are "NO", then may proceed with Cephalosporin use.   . Sulfa Antibiotics   . Vicodin [Hydrocodone-Acetaminophen] Nausea Only   Lab Results:  Results for orders placed or performed during the hospital encounter of 06/27/17 (from the past 48 hour(s))  Urine rapid drug screen (hosp performed)     Status: None   Collection Time: 06/27/17  1:19 PM  Result Value Ref Range   Opiates NONE DETECTED NONE DETECTED   Cocaine NONE DETECTED NONE DETECTED   Benzodiazepines NONE DETECTED NONE DETECTED   Amphetamines NONE DETECTED NONE DETECTED   Tetrahydrocannabinol NONE DETECTED NONE DETECTED   Barbiturates NONE DETECTED NONE DETECTED    Comment:        DRUG SCREEN FOR MEDICAL PURPOSES ONLY.  IF CONFIRMATION IS NEEDED FOR ANY PURPOSE, NOTIFY LAB WITHIN 5 DAYS.        LOWEST DETECTABLE LIMITS FOR URINE DRUG SCREEN Drug Class        Cutoff (ng/mL) Amphetamine      1000 Barbiturate      200 Benzodiazepine   694 Tricyclics       854 Opiates          300 Cocaine          300 THC              50   CBC w/diff     Status: None   Collection Time: 06/27/17  1:30 PM  Result Value Ref Range   WBC 6.0 4.0 - 10.5 K/uL   RBC 4.29 3.87 - 5.11 MIL/uL   Hemoglobin 12.7 12.0 - 15.0 g/dL   HCT 37.9 36.0 - 46.0 %   MCV 88.3 78.0 - 100.0 fL   MCH 29.6 26.0 - 34.0 pg   MCHC 33.5 30.0 - 36.0 g/dL   RDW 13.9 11.5 - 15.5 %   Platelets 269 150 - 400 K/uL   Neutrophils Relative % 51 %   Neutro Abs 3.0 1.7 - 7.7 K/uL   Lymphocytes Relative 39 %   Lymphs Abs 2.4 0.7 - 4.0 K/uL   Monocytes Relative 8 %   Monocytes Absolute 0.5 0.1 - 1.0 K/uL   Eosinophils Relative 2 %   Eosinophils Absolute 0.1 0.0 - 0.7 K/uL   Basophils Relative 0 %   Basophils Absolute 0.0 0.0 - 0.1 K/uL  Comprehensive metabolic panel     Status: Abnormal   Collection Time: 06/27/17  1:30 PM  Result Value Ref Range   Sodium 137 135 - 145 mmol/L   Potassium 4.3 3.5 - 5.1 mmol/L   Chloride 103 101 - 111 mmol/L   CO2 28 22 - 32 mmol/L   Glucose, Bld 101 (H) 65 - 99 mg/dL   BUN 11 6 - 20 mg/dL   Creatinine, Ser 0.86 0.44 - 1.00 mg/dL   Calcium 8.8 (L) 8.9 - 10.3 mg/dL   Total Protein 7.4 6.5 - 8.1 g/dL   Albumin 4.0 3.5 - 5.0 g/dL   AST 20 15 - 41 U/L   ALT 23 14 - 54 U/L   Alkaline Phosphatase 87 38 - 126 U/L   Total Bilirubin 0.3 0.3 - 1.2 mg/dL   GFR calc non Af Amer >60 >60 mL/min   GFR calc Af Amer >60 >60 mL/min    Comment: (NOTE) The eGFR has been calculated using the CKD EPI equation. This calculation has not been validated in all clinical  situations. eGFR's persistently <60 mL/min signify possible Chronic Kidney Disease.    Anion gap 6 5 - 15  Ethanol     Status: None   Collection Time: 06/27/17  1:40 PM  Result Value Ref Range   Alcohol, Ethyl (B) <5 <5 mg/dL    Comment:        LOWEST DETECTABLE LIMIT FOR SERUM ALCOHOL IS 5 mg/dL FOR  MEDICAL PURPOSES ONLY   Salicylate level     Status: None   Collection Time: 06/27/17  1:40 PM  Result Value Ref Range   Salicylate Lvl <5.4 2.8 - 30.0 mg/dL  Acetaminophen level     Status: Abnormal   Collection Time: 06/27/17  1:40 PM  Result Value Ref Range   Acetaminophen (Tylenol), Serum <10 (L) 10 - 30 ug/mL    Comment:        THERAPEUTIC CONCENTRATIONS VARY SIGNIFICANTLY. A RANGE OF 10-30 ug/mL MAY BE AN EFFECTIVE CONCENTRATION FOR MANY PATIENTS. HOWEVER, SOME ARE BEST TREATED AT CONCENTRATIONS OUTSIDE THIS RANGE. ACETAMINOPHEN CONCENTRATIONS >150 ug/mL AT 4 HOURS AFTER INGESTION AND >50 ug/mL AT 12 HOURS AFTER INGESTION ARE OFTEN ASSOCIATED WITH TOXIC REACTIONS.   I-Stat beta hCG blood, ED ([facility], [facility], [facility] only)     Status: None   Collection Time: 06/27/17  1:46 PM  Result Value Ref Range   I-stat hCG, quantitative <5.0 <5 mIU/mL   Comment 3            Comment:   GEST. AGE      CONC.  (mIU/mL)   <=1 WEEK        5 - 50     2 WEEKS       50 - 500     3 WEEKS       100 - 10,000     4 WEEKS     1,000 - 30,000        FEMALE AND NON-PREGNANT FEMALE:     LESS THAN 5 mIU/mL     Blood Alcohol level:  Lab Results  Component Value Date   ETH <5 98/26/4158    Metabolic Disorder Labs:  Lab Results  Component Value Date   HGBA1C 5.4 10/26/2016   MPG 108 10/26/2016   MPG 117 (H) 08/22/2015   No results found for: PROLACTIN Lab Results  Component Value Date   CHOL 270 (H) 10/26/2016   TRIG 100 10/26/2016   HDL 69 10/26/2016   CHOLHDL 3.9 10/26/2016   VLDL 20 10/26/2016   LDLCALC 181 (H) 10/26/2016   LDLCALC 155 (H) 08/22/2015    Current Medications: Current Facility-Administered Medications  Medication Dose Route Frequency Provider Last Rate Last Dose  . albuterol (PROVENTIL HFA;VENTOLIN HFA) 108 (90 Base) MCG/ACT inhaler 1 puff  1 puff Inhalation Q6H PRN Patrecia Pour, NP      . alum & mag hydroxide-simeth (MAALOX/MYLANTA) 200-200-20 MG/5ML suspension 30 mL   30 mL Oral Q6H PRN Patrecia Pour, NP      . FLUoxetine (PROZAC) capsule 40 mg  40 mg Oral Daily Patrecia Pour, NP   40 mg at 06/29/17 0847  . hydrOXYzine (ATARAX/VISTARIL) tablet 50 mg  50 mg Oral Q6H PRN Patrecia Pour, NP   50 mg at 06/28/17 2236  . ibuprofen (ADVIL,MOTRIN) tablet 600 mg  600 mg Oral Q8H PRN Patrecia Pour, NP      . lamoTRIgine (LAMICTAL) tablet 25 mg  25 mg Oral Daily Patrecia Pour, NP   25 mg  at 06/29/17 0847  . linaclotide (LINZESS) capsule 72 mcg  72 mcg Oral QAC breakfast Patrecia Pour, NP   72 mcg at 06/29/17 0617  . magnesium hydroxide (MILK OF MAGNESIA) suspension 30 mL  30 mL Oral Daily PRN Patrecia Pour, NP      . ondansetron [facility]) tablet 4 mg  4 mg Oral Q8H PRN Patrecia Pour, NP      . traZODone (DESYREL) tablet 50 mg  50 mg Oral QHS PRN Patrecia Pour, NP   50 mg at 06/28/17 2113   PTA Medications: Prescriptions Prior to Admission  Medication Sig Dispense Refill Last Dose  . albuterol (PROVENTIL HFA;VENTOLIN HFA) 108 (90 Base) MCG/ACT inhaler Inhale into the lungs every 6 (six) hours as needed for wheezing or shortness of breath.   Past Month at Unknown time  . amitriptyline (ELAVIL) 25 MG tablet Take 25 mg by mouth at bedtime as needed for sleep.    Past Week at Unknown time  . docusate sodium (COLACE) 100 MG capsule Take 1 capsule (100 mg total) by mouth 2 (two) times daily. (Patient not taking: Reported on 06/27/2017) 40 capsule 0 Not Taking at Unknown time  . FLUoxetine (PROZAC) 40 MG capsule Take 40 mg by mouth daily.    06/26/2017 at Unknown time  . LINZESS 72 MCG capsule TAKE 1 CAPSULE BY MOUTH DAILY BEFORE BREAKFAST 30 capsule 1 06/27/2017 at Unknown time  . ondansetron (ZOFRAN) 4 MG tablet Take 1 tablet (4 mg total) by mouth every 6 (six) hours as needed for nausea or vomiting. 30 tablet 0 Past Month at Unknown time  . predniSONE (DELTASONE) 50 MG tablet 1 tab po daily for 6 days. Take with food. Start 06/04/17. (Patient not taking: Reported  on 06/27/2017) 6 tablet 0 Not Taking at Unknown time  . SHINGRIX injection Inject 1 Dose into the muscle once.  0 03/20/2017  . traZODone (DESYREL) 50 MG tablet Take 50 mg by mouth at bedtime.   06/26/2017 at Unknown time    Musculoskeletal: Strength & Muscle Tone: within normal limits Gait & Station: normal Patient leans: N/A  Psychiatric Specialty Exam: Physical Exam  Vitals reviewed. Constitutional: She is oriented to person, place, and time. She appears well-developed.  Neurological: She is alert and oriented to person, place, and time.  Psychiatric: She has a normal mood and affect. Her behavior is normal.    Review of Systems  Psychiatric/Behavioral: The patient is nervous/anxious.     Blood pressure 110/71, pulse 81, temperature 98.4 F (36.9 C), temperature source Oral, resp. rate 18, height 5' 2.5" (1.588 m), weight 78.9 kg (174 lb), SpO2 100 %.Body mass index is 31.32 kg/m.  General Appearance: Casual and Guarded  Eye Contact:  Fair  Speech:  Clear and Coherent  Volume:  Normal  Mood:  Depressed  Affect:  Blunt, Congruent and Depressed  Thought Process:  Coherent  Orientation:  Full (Time, Place, and Person)  Thought Content:  Delusions, Paranoid Ideation and Rumination  Suicidal Thoughts:  No  Homicidal Thoughts:  No  Memory:  Immediate;   Fair Recent;   Fair Remote;   Fair  Judgement:  Fair  Insight:  Fair  Psychomotor Activity:  Normal  Concentration:  Concentration: Fair  Recall:  [employer] of Knowledge:  Fair  Language:  Fair  Akathisia:  No  Handed:  Right  AIMS (if indicated):     Assets:  Desire for Improvement Resilience Social Support  ADL's:  Intact  Cognition:  WNL  Sleep:       I agree with current treatment plan on 06/29/2017, Patient seen face-to-face for psychiatric evaluation follow-up, chart reviewed and case discussed with the MD [place]. Reviewed the information documented and agree with the treatment plan.  Treatment Plan  Summary: Daily contact with patient to assess and evaluate symptoms and progress in treatment and Medication management   Continue with Prozac 40 mg, Lamictal 6m for mood stabilization. Continue with Trazodone 50 mg for insomnia Will continue to monitor vitals ,medication compliance and treatment side effects while patient is here.  Reviewed labs: ,BAL - , UDS -  CSW will start working on disposition.  Patient to participate in therapeutic milieu  Observation Level/Precautions:  15 minute checks  Laboratory:  CBC Chemistry Profile HbAIC UDS  Psychotherapy:  Individual and group session  Medications:  See above  Consultations:  Psychiatry  Discharge Concerns:  Safety, stabilization, and risk of access to medication and medication stabilization   Estimated LOS: 5-7days  Other:     Physician Treatment Plan for Primary Diagnosis: Bipolar 2 disorder ([place] Long Term Goal(s): Improvement in symptoms so as ready for discharge  Short Term Goals: Ability to identify changes in lifestyle to reduce recurrence of condition will improve, Ability to verbalize feelings will improve, Ability to maintain clinical measurements within normal limits will improve and Compliance with prescribed medications will improve  Physician Treatment Plan for Secondary Diagnosis: Principal Problem:   Bipolar 2 disorder [place]  Long Term Goal(s): Improvement in symptoms so as ready for discharge  Short Term Goals: Ability to verbalize feelings will improve, Ability to identify and develop effective coping behaviors will improve and Ability to identify triggers associated with substance abuse/mental health issues will improve  I certify that inpatient services furnished can reasonably be expected to improve the patient's condition.    [place]ill [place] NP 7/21/201811:57 AM

## 2017-06-29 NOTE — [facility] Group Notes (Signed)
Nursing Psychoeducational group - Coping skills. Patient did not attend, despite MHT and RN prompting.

## 2017-06-30 DIAGNOSIS — G4700 Insomnia, unspecified: Secondary | ICD-10-CM

## 2017-06-30 DIAGNOSIS — F331 Major depressive disorder, recurrent, moderate: Secondary | ICD-10-CM

## 2017-06-30 DIAGNOSIS — Z818 Family history of other mental and behavioral disorders: Secondary | ICD-10-CM

## 2017-06-30 DIAGNOSIS — F39 Unspecified mood [affective] disorder: Secondary | ICD-10-CM

## 2017-06-30 NOTE — Progress Notes (Signed)
[facility] MD Progress Note  06/30/2017 1:32 PM Stephanie Serrano  MRN:  [id number] Subjective:  I dont know why Im here. I went through some things when I was at [facility].   Per nursing:Patient seen most of the shift hanging out and talking with female peer. Patient stated she had a good day except that she didn't sleep last night because of her room mate snoring but report says that patient slept well last night. Patient said that she is trying to establish some "good relationship with people that cares to and that is the only thing that can keep her going while on unit. Reports how she's been transferred to "3" rooms since she came in here but she accepted it in good faith. She denies pain, SI,HI, AH/VH at this time.  Staff reminded patient about unit policy on boundaries. Offered support and encouragement as needed. Routine safety checks maintained. Will continue to monitor patient.  Patient remains safe on unit. Objective: Stephanie Serrano is a 51 year old female who presented to ED for worsening anxiety, DID, with 2 identities [name] and [name]. She reports that [name] wants her to harm others. On todays evaluation she is observed to be alert and oriented x 3, calm and cooperative. She states that she is not sure why she is here, she is able to identify what took place prior to her admission. At this time she denies psychosis, depression, ad anxiety. " Im only doing what I can so I can get out of here early. Im taking my medications, praying and going to grove. She appears to have linear, coherent and concrete thinking at the time. She states she told the staff that she disconnected and wasn't thinking clearly, but adamantly denies suicidal, homicidal, and anxiety disorders. She was continued on her current medication which includes Prozac 60mg  po daily, Lamictal 25mg  po mood, and Trazodone 50mg  po qhs prn for insomnia.    Principal Problem: MDD (major depressive disorder), recurrent episode, moderate ([place]) Diagnosis:   Patient  Active Problem List   Diagnosis Date Noted  . MDD (major depressive disorder), recurrent episode, moderate ([place]) [F33.1] 06/28/2017  . OCD (obsessive compulsive disorder) [F42.9] 03/26/2017  . Insomnia [G47.00] 02/08/2016  . Heel spur [M77.30] 09/01/2015  . Family history of cystitis [Z84.2] 08/31/2015  . Left foot pain [M79.672] 08/31/2015  . Prediabetes [R73.03] 08/23/2015  . Hyperlipidemia [E78.5] 08/23/2015  . Depression [F32.9] 03/21/2015  . Acute esophagitis [K20.9] 02/09/2015  . Gastroesophageal reflux disease without esophagitis [K21.9] 08/27/2014  . Dysphagia, unspecified(787.20) [R13.10] 03/22/2014   Total Time spent with patient: 20 minutes  Past Psychiatric History: Anxiety, OCD, MDD.   See above for outpatient therapist/  Past Medical History:  Past Medical History:  Diagnosis Date  . Anxiety   . Asthma   . Depression   . Esophageal stricture   . Migraines   . OCD (obsessive compulsive disorder)   . Plantar fasciitis of right foot   . RLS (restless legs syndrome)   . Vertigo     Past Surgical History:  Procedure Laterality Date  . ESOPHAGOGASTRODUODENOSCOPY N/A 02/09/2015   Procedure: ESOPHAGOGASTRODUODENOSCOPY (EGD);  Surgeon: Inda Castle, MD;  Location: [facility];  Service: Endoscopy;  Laterality: N/A;  with dilation  . FOOT SURGERY    . NASAL SINUS SURGERY    . PLANTAR FASCIA RELEASE Right 08/03/2016   Procedure: PLANTAR FASCIA RELEASE WITH BONE SPUR EXCISION;  Surgeon: Dorna Leitz, MD;  Location: [place];  Service: Orthopedics;  Laterality:  Right;   Family History:  Family History  Problem Relation Age of Onset  . Pancreatic cancer Father   . Diabetes Mother   . Heart disease Mother   . Anxiety disorder Brother   . Diabetes Paternal Grandmother   . Esophageal cancer Neg Hx   . Stomach cancer Neg Hx    Family Psychiatric  History: Social History:  History  Alcohol Use  . Yes    Comment: socially     History  Drug Use  No    Social History   Social History  . Marital status: Divorced    Spouse name: N/A  . Number of children: 2  . Years of education: N/A   Occupational History  . [employer]   Social History Main Topics  . Smoking status: Never Smoker  . Smokeless tobacco: Never Used  . Alcohol use Yes     Comment: socially  . Drug use: No  . Sexual activity: No   Other Topics Concern  . None   Social History Narrative  . None   Additional Social History:        Sleep: Good  Appetite:  Fair  Current Medications: Current Facility-Administered Medications  Medication Dose Route Frequency Provider Last Rate Last Dose  . albuterol (PROVENTIL HFA;VENTOLIN HFA) 108 (90 Base) MCG/ACT inhaler 1 puff  1 puff Inhalation Q6H PRN Patrecia Pour, NP      . alum & mag hydroxide-simeth (MAALOX/MYLANTA) 200-200-20 MG/5ML suspension 30 mL  30 mL Oral Q6H PRN Patrecia Pour, NP      . FLUoxetine (PROZAC) capsule 60 mg  60 mg Oral Daily Hisada, Elie Goody, MD   60 mg at 06/30/17 0829  . hydrOXYzine (ATARAX/VISTARIL) tablet 50 mg  50 mg Oral Q6H PRN Patrecia Pour, NP   50 mg at 06/29/17 2113  . ibuprofen (ADVIL,MOTRIN) tablet 600 mg  600 mg Oral Q8H PRN Patrecia Pour, NP      . lamoTRIgine (LAMICTAL) tablet 25 mg  25 mg Oral Daily Patrecia Pour, NP   25 mg at 06/30/17 1610  . linaclotide (LINZESS) capsule 72 mcg  72 mcg Oral QAC breakfast Patrecia Pour, NP   72 mcg at 06/30/17 9604  . loperamide (IMODIUM) capsule 2 mg  2 mg Oral PRN Derrill Center, NP      . magnesium hydroxide (MILK OF MAGNESIA) suspension 30 mL  30 mL Oral Daily PRN Patrecia Pour, NP      . ondansetron [facility]) tablet 4 mg  4 mg Oral Q8H PRN Patrecia Pour, NP      . traZODone (DESYREL) tablet 50 mg  50 mg Oral QHS PRN Patrecia Pour, NP   50 mg at 06/29/17 2109    Lab Results: No results found for this or any previous visit (from the past 48 hour(s)).  Blood Alcohol level:  Lab Results  Component Value Date    ETH <5 54/08/8118    Metabolic Disorder Labs: Lab Results  Component Value Date   HGBA1C 5.4 10/26/2016   MPG 108 10/26/2016   MPG 117 (H) 08/22/2015   No results found for: PROLACTIN Lab Results  Component Value Date   CHOL 270 (H) 10/26/2016   TRIG 100 10/26/2016   HDL 69 10/26/2016   CHOLHDL 3.9 10/26/2016   VLDL 20 10/26/2016   LDLCALC 181 (H) 10/26/2016   LDLCALC 155 (H) 08/22/2015    Physical Findings: AIMS: Facial and Oral Movements Muscles  of Facial Expression: None, normal Lips and Perioral Area: None, normal Jaw: None, normal Tongue: None, normal,Extremity Movements Upper (arms, wrists, hands, fingers): None, normal Lower (legs, knees, ankles, toes): None, normal, Trunk Movements Neck, shoulders, hips: None, normal, Overall Severity Severity of abnormal movements (highest score from questions above): None, normal Incapacitation due to abnormal movements: None, normal Patient's awareness of abnormal movements (rate only patient's report): No Awareness, Dental Status Current problems with teeth and/or dentures?: No Does patient usually wear dentures?: No  CIWA:    COWS:     Musculoskeletal: Strength & Muscle Tone: within normal limits Gait & Station: normal Patient leans: N/A  Psychiatric Specialty Exam: Physical Exam  ROS  Blood pressure 103/80, pulse 92, temperature (!) 97.4 F (36.3 C), resp. rate 18, height 5' 2.5" (1.588 m), weight 78.9 kg (174 lb), SpO2 100 %.Body mass index is 31.32 kg/m.  General Appearance: Casual and Guarded  Eye Contact:  Fair  Speech:  Clear and Coherent and Normal Rate  Volume:  Normal  Mood:  Euthymic  Affect:  Appropriate and Congruent  Thought Process:  Coherent, Goal Directed and Descriptions of Associations: Circumstantial  Orientation:  Full (Time, Place, and Person)  Thought Content:  Logical  Suicidal Thoughts:  No  Homicidal Thoughts:  No  Memory:  Immediate;   Fair Recent;   Fair  Judgement:  Fair   Insight:  Fair  Psychomotor Activity:  Normal  Concentration:  Concentration: Fair and Attention Span: Fair  Recall:  [employer] of Knowledge:  Fair  Language:  Fair  Akathisia:  No  Handed:  Right  AIMS (if indicated):     Assets:  Communication Skills Desire for Improvement Financial Resources/Insurance Leisure Time Physical Health Social Support Transportation Vocational/Educational  ADL's:  Intact  Cognition:  WNL  Sleep:        Treatment Plan Summary: Daily contact with patient to assess and evaluate symptoms and progress in treatment and Medication management   Daily contact with patient to assess and evaluate symptoms and progress in treatment and Medication management   Continue with Prozac 40 mg, Lamictal 25mg  for mood stabilization. Continue with Trazodone 50 mg for insomnia Will continue to monitor vitals ,medication compliance and treatment side effects while patient is here.  Reviewed labs: ,BAL - , UDS -  CSW will start working on disposition.  Patient to participate in therapeutic milieu  Nanci Pina, FNP 06/30/2017, 1:32 PM

## 2017-06-30 NOTE — BHH Group Notes (Signed)
Pt attended wrap up group. Pt stated she met her goal today by relaxing and fellowship with peers. Pt requested towels, wash cloths and pillow. Pt needs were met for the night. Trudie Buckler, MHt

## 2017-06-30 NOTE — Progress Notes (Signed)
D: Patient's self inventory sheet: patient has good sleep, received sleep medication.good  Appetite, normal energy level, good concentration. Rated depression 0/10, hopeless 0/10, anxiety 1/10. SI/HI/AVH: denies all. Physical complaints are stomach discomfort from Linzess. Goal is "enjoying my day, connecting with others, and reading my bible". Plans to work on "relax".   A: Medications administered, assessed medication knowledge and education given on medication regimen.  Emotional support and encouragement given patient. R: Denies SI and HI , contracts for safety. Safety maintained with 15 minute checks.

## 2017-06-30 NOTE — BHH Group Notes (Signed)
[place] LCSW Group Therapy  06/30/2017 11:15 AM  Type of Therapy:  Group Therapy  Participation Level:  Active  Participation Quality:  Appropriate and Attentive  Affect:  Appropriate  Cognitive:  Alert and Oriented  Insight:  Improving  Engagement in Therapy:  Improving  Modes of Intervention:  Discussion  Today's group discussed current progress in preparation for discharge. Group discussion included recognizing tools and insights gained during inpatient process to prepare for discharge. Identifying key elements for success at home including professional supports, social supports, self care and medication compliance. Also discussed assessing usefulness of coping skills in order to manage mood and emotions as you progress toward discharge. Patient identified that she used exercise as her method of self care.   Christene Lye MSW, LCSW

## 2017-06-30 NOTE — Progress Notes (Signed)
DAR Note: Patient seen most of the shift hanging out and talking with female peer. Patient stated she had a good day except that she didn't sleep last night because of her room mate snoring but report says that patient slept well last night. Patient said that she is trying to establish some "good relationship with people that cares to and that is the only thing that can keep her going while on unit. Reports how she's been transferred to "3" rooms since she came in here but she accepted it in good faith. She denies pain, SI,HI, AH/VH at this time.  Staff reminded patient about unit policy on boundaries. Offered support and encouragement as needed. Routine safety checks maintained. Will continue to monitor patient.  Patient remains safe on unit.

## 2017-07-01 DIAGNOSIS — F419 Anxiety disorder, unspecified: Secondary | ICD-10-CM

## 2017-07-01 MED ORDER — LAMOTRIGINE 25 MG PO TABS
50.0000 mg | ORAL_TABLET | Freq: Every day | ORAL | Status: DC
  Filled 2017-07-01: qty 2

## 2017-07-01 MED ORDER — LAMOTRIGINE 25 MG PO TABS
25.0000 mg | ORAL_TABLET | Freq: Every day | ORAL | Status: AC
  Administered 2017-07-01: 25 mg via ORAL
  Filled 2017-07-01: qty 1

## 2017-07-01 NOTE — Progress Notes (Signed)
Patient's counselor, Valora Piccolo, phone [phone], called and wants to discuss patient's status and discharge date.  Patient has appointment with this counselor on Wednesday 1:00 p.m.

## 2017-07-01 NOTE — BHH Group Notes (Signed)
[facility] LCSW Aftercare Discharge Planning Group Note   07/01/2017 10:01 AM  Participation Quality:  Appropriate, pleasant  Mood/Affect:  Appropriate  Plan for Discharge/Comments:  Return home, follow up w Beverly Sessions and [place]: "car is out front, I can drive"  Supports: family  Beverely Pace  LCSW

## 2017-07-01 NOTE — BHH Suicide Risk Assessment (Signed)
[facility] INPATIENT:  Family/Significant Other Suicide Prevention Education  Suicide Prevention Education:  Education Completed; Valora Piccolo, therapist, [phone],  (name of family member/significant other) has been identified by the patient as the family member/significant other with whom the patient will be residing, and identified as the person(s) who will aid the patient in the event of a mental health crisis (suicidal ideations/suicide attempt).  With written consent from the patient, the family member/significant other has been provided the following suicide prevention education, prior to the and/or following the discharge of the patient.  The suicide prevention education provided includes the following:  Suicide risk factors  Suicide prevention and interventions  National Suicide Hotline telephone number  [facility] assessment telephone number  [facility] Emergency Assistance [place] and/or Residential Mobile Crisis Unit telephone number  Request made of family/significant other to:  Remove weapons (e.g., guns, rifles, knives), all items previously/currently identified as safety concern.    Remove drugs/medications (over-the-counter, prescriptions, illicit drugs), all items previously/currently identified as a safety concern.  The family member/significant other verbalizes understanding of the suicide prevention education information provided.  The family member/significant other agrees to remove the items of safety concern listed above.  Per therapist, patient is doing well, therapist has no concerns about suicidality.  States that patient has daughters that are protective factors, no access to weapons, good support from mental health providers as well as neighbors.  CSW did not review SPE as therapist is familiar with these concepts as a licensed professional.   Beverely Pace 07/01/2017, 12:47 PM

## 2017-07-01 NOTE — Social Work (Signed)
CSW spoke w therapist Valora Piccolo [facility] - states that she has spoken w patient twice during this hospital stay.  "She seems fine at this time, medications are different than what she had been taking, anxiety medication was making her tired."  Per therapist, "she has several personalities which come from when she was young and left alone a lot - a new personality was created [name]) during the recent trip to [place] "he is more aggressive, but no suicidality."  I dont see her doing that because of children."  Patient lives w 27 and 82 year old daughters, stable housing, has support in neighborhood.  Would have no concerns if patient was discharged today, therapist is long term w patient.  Not aware of any weapons in the home.   Edwyna Shell, LCSW Lead Clinical Social Worker Phone:  [phone]

## 2017-07-01 NOTE — BHH Group Notes (Signed)
[place] LCSW Group Therapy  07/01/2017 1:30 - 2:15 PM  Todays Topic: Overcoming Obstacles. Patients identified one short term goal and potential obstacles in reaching this goal. Patients processed barriers involved in overcoming these obstacles. Patients identified steps necessary for overcoming these obstacles and explored motivation (internal and external) for facing these difficulties head on.  Type of Therapy:  Group Therapy  Participation Level:  Active  Participation Quality:  {Appropriate  Affect:  Appropriate  Cognitive: Appropriate  Insight: Appropriate  Engagement in Therapy: Engaged  Modes of Intervention:  Discussion, Problem-solving and Support  Summary of Progress/Problems:  States she is learning to "trust the process here", was suspicious of medications adjustments/changes.  States she has difficulty trusting others.  Described communication difficulties w family members and validated concerns/statements of peers effectively and empathically.   Stephanie Pace  LCSW 07/01/2017, 3:16 PM

## 2017-07-01 NOTE — Progress Notes (Signed)
Recreation Therapy Notes  Date: 07/01/2017 Time: 10:00am Location: [address] Dayroom  Group Topic: Coping Skills  Goal Area(s) Addresses:  Pt will successfully identify at least five things that are preventing them from moving forward. Pt will successfully identify at least two coping skills they can use to move forward from the places they are currently "stuck."  Behavioral Response: Engaged  Intervention: Art  Activity: Pt was given a piece of printer paper and asked to draw a spider web. Pt was asked to identify at least 5 things holding them back and 10 coping skills to help them get past the things that are holding them back. Pt then shared their spider webs with peers.  Education:Coping Skills, Discharge Planning  Education Outcome: Acknowledges understanding   Clinical Observations/Feedback:  Pt actively participated in activity. Pt was able to identify her OCD, disconnection and doing too much for others as things that are holding her back. Pt identified solutions to these problems such as taking her medicine, meditating, taking deep breaths, having lunch or dinner with her friends and trying to be assertive without feeling guilty.  Donovan Kail, Recreation Therapy Intern  Victorino Sparrow, LRT/CTRS
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
## 2017-07-01 NOTE — Social Work (Signed)
Referred to [facility] Team, is [place] resident.  Aynslee Mulhall, LCSW Lead Clinical Social Worker Phone:  [phone]  

## 2017-07-01 NOTE — Social Work (Signed)
CSW left VM requesting call back from patient's therapist, Valora Piccolo [place] received information from [place] assessor that therapist had collateral information and wanted to speak w [facility] staff.  Awaiting return phone call.  Edwyna Shell, LCSW Lead Clinical Social Worker Phone:  [phone]
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
## 2017-07-01 NOTE — Plan of Care (Signed)
Problem: Education: Goal: Will be free of psychotic symptoms Outcome: Progressing Nurse discussed depression/anxiety/coping skills with patient.   
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
## 2017-07-01 NOTE — Progress Notes (Signed)
D:  Patient's self inventory sheet, patient has good sleep, sleep medication helpful.  Good appetite, normal energy level, good concentration.  Denied depression, hopeless and anxiety.  Denied withdrawals.  Denied SI.  Denied physical problems.  Denied physical pain.  Goal is reading, socializing in common area.  Plans to start out in common area, go take shower, then read.  Does have discharge plans. A:  Medications administered per MD orders.  Emotional support and encouragement given patient. R:  Denied SI and HI, contracts for safety.  Denied A/V hallucinations.  Safety maintained with 15 minute checks.

## 2017-07-01 NOTE — Progress Notes (Signed)
Did not attend group 
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
## 2017-07-02 MED ORDER — ALBUTEROL SULFATE HFA 108 (90 BASE) MCG/ACT IN AERS: 2.0000 | INHALATION_SPRAY | Freq: Four times a day (QID) | RESPIRATORY_TRACT | Status: AC | PRN

## 2017-07-02 MED ORDER — LINACLOTIDE 72 MCG PO CAPS: ORAL_CAPSULE | ORAL | 0 refills | Status: DC

## 2017-07-02 MED ORDER — HYDROXYZINE HCL 50 MG PO TABS: 50.0000 mg | ORAL_TABLET | Freq: Four times a day (QID) | ORAL | 0 refills | Status: DC | PRN

## 2017-07-02 MED ORDER — TRAZODONE HCL 50 MG PO TABS: 50.0000 mg | ORAL_TABLET | Freq: Every evening | ORAL | 0 refills | Status: DC | PRN

## 2017-07-02 MED ORDER — LAMOTRIGINE 25 MG PO TABS: 50.0000 mg | ORAL_TABLET | Freq: Every day | ORAL | 0 refills | Status: DC

## 2017-07-02 MED ORDER — FLUOXETINE HCL 20 MG PO CAPS: 60.0000 mg | ORAL_CAPSULE | Freq: Every day | ORAL | 0 refills | Status: DC

## 2017-07-02 MED ORDER — ONDANSETRON HCL 4 MG PO TABS: 4.0000 mg | ORAL_TABLET | Freq: Four times a day (QID) | ORAL | 0 refills | Status: DC | PRN

## 2017-07-02 NOTE — Plan of Care (Signed)
Problem: Activity: Goal: Sleeping patterns will improve Outcome: Progressing Patient reports sleeping well with current regimen. Patient reports Lamictal is better for her at night due to drowsiness.   Problem: Safety: Goal: Periods of time without injury will increase Outcome: Progressing Patient is on q15 minute safety checks and low fall risk precautions. Patient contracts for safety on the unit and remains safe at this time.

## 2017-07-02 NOTE — Progress Notes (Signed)
Recreation Therapy Notes  07/02/2017 Approximately 9:30am Recreation Therapy Intern asked pt to identify 5 things that they liked about themselves or that they are good at. Pt identified that she is an excellent mom, an excellent swimmer, optimistic, have a great, bubbly personality and a creative person. Recreation Therapy Intern wrote these five statements on a piece of printer paper and asked pt to read it allowed. Pt stated she could hang this on her bathroom mirror and this way she can see it every morning and it can remind her of her self-worth.  Donovan Kail, Recreation Therapy Intern   Donovan Kail    Victorino Sparrow, LRT/CTRS  07/02/2017 12:14 PM

## 2017-07-02 NOTE — [facility] Suicide Risk Assessment (Signed)
[facility] Discharge Suicide Risk Assessment   Principal Problem: MDD (major depressive disorder), recurrent episode, moderate ([place]) Discharge Diagnoses:  Patient Active Problem List   Diagnosis Date Noted  . Depression [F32.9] 03/21/2015    Priority: High  . MDD (major depressive disorder), recurrent episode, moderate ([place]) [F33.1] 06/28/2017  . OCD (obsessive compulsive disorder) [F42.9] 03/26/2017  . Obstructive sleep apnea of adult [G47.33] 01/18/2017  . Insomnia [G47.00] 02/08/2016  . Heel spur [M77.30] 09/01/2015  . Family history of cystitis [Z84.2] 08/31/2015  . Left foot pain [M79.672] 08/31/2015  . Prediabetes [R73.03] 08/23/2015  . Hyperlipidemia [E78.5] 08/23/2015  . Acute esophagitis [K20.9] 02/09/2015  . Gastroesophageal reflux disease without esophagitis [K21.9] 08/27/2014  . Dysphagia, unspecified(787.20) [R13.10] 03/22/2014  Patient is a 51 year old female diagnosed with major depressive disorder, anxiety disorder, OCD was transferred from [place] [facility] long ED for stabilization and treatment of voices telling her to harm herself and others. Patient went to visit her family in [place], and when she returned home, she had to people which appeared] [name] and may meet].  This morning, patient reports that she is doing fairly well, adds that she's eating fine and sleeping well. She denies any hallucinations, any paranoia, any nightmares and reports that her mood is good. She also denies any symptoms of anxiety. On a scale of 0-10, with 0 being no symptoms in 10 being the worse, patient reports that her depression is a 3 out of 10 and on the same scale her anxiety is a 2 out of 10. She denies any aggravating or relieving factors. Patient denies any side effects with the medications, any thoughts of self, harm to others.  Total Time spent with patient: 30 minutes  Musculoskeletal: Strength & Muscle Tone: within normal limits Gait & Station: normal Patient leans: N/A  Psychiatric  Specialty Exam: Review of Systems  Constitutional: Negative.  Negative for fever, malaise/fatigue and weight loss.  Eyes: Negative.  Negative for blurred vision, double vision, discharge and redness.  Respiratory: Negative.  Negative for cough, shortness of breath and wheezing.   Cardiovascular: Negative.  Negative for chest pain and palpitations.  Gastrointestinal: Negative.  Negative for abdominal pain, constipation, diarrhea, heartburn, nausea and vomiting.  Genitourinary: Negative.  Negative for dysuria.  Musculoskeletal: Negative.  Negative for falls and myalgias.  Neurological: Negative.  Negative for dizziness and weakness.  Endo/Heme/Allergies: Negative.  Negative for environmental allergies.  Psychiatric/Behavioral: Negative.  Negative for depression, hallucinations, memory loss, substance abuse and suicidal ideas. The patient is not nervous/anxious and does not have insomnia.     Blood pressure 121/78, pulse 77, temperature 98.3 F (36.8 C), resp. rate 16, height 5' 2.5" (1.588 m), weight 78.9 kg (174 lb), SpO2 100 %.Body mass index is 31.32 kg/m.  General Appearance: Casual  Eye Contact::  Fair  Speech:  Clear and Coherent and Normal Rate409  Volume:  Normal  Mood:  Euthymic  Affect:  Appropriate, Congruent and Full Range  Thought Process:  Coherent, Goal Directed and Descriptions of Associations: Intact  Orientation:  Full (Time, Place, and Person)  Thought Content:  Logical and Rumination  Suicidal Thoughts:  No  Homicidal Thoughts:  No  Memory:  Immediate;   Fair Recent;   Fair Remote;   Fair  Judgement:  Intact  Insight:  Present  Psychomotor Activity:  Normal  Concentration:  Fair  Recall:  Gore  Language: Fair  Akathisia:  No  Handed:  Right  AIMS (if indicated):     Assets:  Communication Skills Housing Physical Health Social Support  Sleep:  Number of Hours: 6.75  Cognition: WNL  ADL's:  Intact   Mental Status Per Nursing  Assessment::   On Admission:  NA  Demographic Factors:  NA  Loss Factors: NA  Historical Factors: Impulsivity  Risk Reduction Factors:   Sense of responsibility to family and Living with another person, especially a relative  Continued Clinical Symptoms:  Previous Psychiatric Diagnoses and Treatments  Cognitive Features That Contribute To Risk:  None    Suicide Risk:  Minimal: No identifiable suicidal ideation.  Patients presenting with no risk factors but with morbid ruminations; may be classified as minimal risk based on the severity of the depressive symptoms  Follow-up Information    [facility] Follow up on 07/04/2017.   Specialty:  Behavioral Health Why:  Patient current w Dr Orlena Sheldon for medication management.  Next appointment is 7/26 at 10:45 w Dr Melina Copa.  Contact information: [place] [address] [phone]        [facility] Follow up on 07/03/2017.   Why:  Patient current w this therapist, next appointment w 7/25 at 1 PM.  Please call to cancel/reschedule if needed.   Contact information: [address] [place]  [address] Phone:  [phone] Fax:  [phone]          Plan Of Care/Follow-up recommendations:  Activity:  As tolerated Diet:  Regular Other:  Keep follow-up appointments and take medications as prescribed  Hampton Abbot, MD 07/02/2017, 10:23 AM

## 2017-07-02 NOTE — Progress Notes (Signed)
  [facility] Adult Case Management Discharge Plan :  Will you be returning to the same living situation after discharge:  Yes,  Patient is returning home with her family on today. At discharge, do you have transportation home?: Yes,  Patient drove herself to the hospital. Car outside and will provide her own transportation Do you have the ability to pay for your medications: Yes,  Patient has insurance  Release of information consent forms completed and in the chart;  Patient's signature needed at discharge.  Patient to Follow up at: Follow-up Information    [facility] Follow up on 07/04/2017.   Specialty:  Behavioral Health Why:  Patient current w Dr Orlena Sheldon for medication management.  Next appointment is 7/26 at 10:45 w Dr Melina Copa.  Contact information: [place] [address] [phone]        [facility] Follow up on 07/03/2017.   Why:  Patient current w this therapist, next appointment w 7/25 at 1 PM.  Please call to cancel/reschedule if needed.   Contact information: [address] [place]  [address] Phone:  [phone] Fax:  [phone]          Next level of care provider has access to [place] and Suicide Prevention discussed: Yes,  Discussed with patient's therapist Valora Piccolo  Have you used any form of tobacco in the last 30 days? (Cigarettes, Smokeless Tobacco, Cigars, and/or Pipes): No  Has patient been referred to the Quitline?: N/A patient is not a smoker  Patient has been referred for addiction treatment: N/A  Raymondo Band 07/02/2017, 9:34 AM

## 2017-07-02 NOTE — Progress Notes (Signed)
Nursing Discharge Note 07/02/2017 [id number]  Data Reports sleeping good with PRN sleep med.  Rates depression 0/10, hopelessness 0/10, and anxiety 0/10. Affect appropriate.  Denies HI, SI, AVH.  Attending groups, pleasant.  Received discharge orders.  Action Spoke with patient 1:1, nurse offered support to patient throughout shift.  Reviewed medications, discharge instructions, and follow up appointments with patient. Medication scripts reviewed and given to patient.  Paperwork, AVS, SRA, and transition record handed to patient.   Escorted off of unit at 1050.   Discharged to lobby.Belongings returned per belongings form in addition to her hand bag and car keys.  Response Verbalized understanding of discharge teaching. Agrees to contact someone or 911 with thoughts/intent to harm self or others.    To follow up per AVS.

## 2017-07-02 NOTE — Progress Notes (Signed)
Recreation Therapy Notes  INPATIENT RECREATION TR PLAN  Patient Details Name: Stephanie Serrano MRN: [id number] DOB: 09-Aug-1966 Today's Date: 07/02/2017  Rec Therapy Plan Is patient appropriate for Therapeutic Recreation?: Yes Treatment times per week: at least 3x Estimated Length of Stay: 5-7 days TR Treatment/Interventions: Group participation (Comment) (appropriate participation in recreation therapy group sessions)  Discharge Criteria Pt will be discharged from therapy if:: Discharged Treatment plan/goals/alternatives discussed and agreed upon by:: Patient/family  Discharge Summary Short term goals set: Patient will improve self-esteem, as demonstrated by ability to identify at least 5 positive qualities about him/herself by conclusion of recreation therapy tx  Short term goals met: Complete Progress toward goals comments: One-to-one attended Which groups?: Coping skills, Exercise One-to-one attended: self-esteem Reason goals not met: n/a Therapeutic equipment acquired: n/a Reason patient discharged from therapy: Discharge from hospital Pt/family agrees with progress & goals achieved: Yes Date patient discharged from therapy: 07/02/17  Donovan Kail, Recreation Therapy Intern  Donovan Kail 07/02/2017, 12:58 PM    Victorino Sparrow, LRT/CTRS

## 2017-07-02 NOTE — Plan of Care (Signed)
Problem: [facility] Participation in Recreation Therapeutic Interventions Goal: STG-Patient will demonstrate improved self esteem by identif STG: Self-Esteem - Patient will improve self-esteem, as demonstrated by ability to identify at least 5 positive qualities about him/herself by conclusion of recreation therapy tx  Outcome: Completed/Met Date Met: 07/02/17 Pt successfully identified five qualities about herself to improve her self-esteem which was that she is an excellent mom, an excellent swimmer, creative, optimistic and has a great, bubbly personality.  Donovan Kail, Recreation Therapy Intern

## 2017-07-02 NOTE — Progress Notes (Signed)
Nursing Progress Note [id number]  D) Patient presents calm and cooperative. Patient reports "I hope to discharge Wednesday, I am trying to focus on the positives. I am a silver lining person". Patient observed talking closely with female peer. Patient easily redirected by staff. Patient is seen interactive in the milieu. Patient denies SI/HI/AVH or pain. Patient complains of a mild headache relieved by mortin. Patient contracts for safety on the unit. Patient reports sleeping well with current regimen.   A) Emotional support given. 1:1 interaction and active listening provided. Patient medicated as prescribed. Medications and plan of care reviewed with patient. Patient verbalized understanding without further questions.  Snacks and fluids provided. Opportunities for questions or concerns presented to patient. Patient encouraged to continue to work on treatment goals. Labs, vital signs and patient behavior monitored throughout shift. Patient safety maintained with q15 min safety checks. Low fall risk precautions in place and reviewed with patient; patient verbalized understanding.  R) Patient receptive to interaction with nurse. Patient remains safe on the unit at this time. Patient denies any adverse medication reactions at this time. Patient is resting in bed without complaints. Will continue to monitor.

## 2017-07-02 NOTE — Progress Notes (Signed)
Recreation Therapy Notes  Date: 07/02/2017 Time: 10:00am Location: [address] Dayroom  Group Topic: Exercise  Goal Area(s) Addresses:  Pt will be able to identify the benefits of exercising. Pt will be able to experience the benefits of exercising during the group.  Behavioral Response: Engaged  Intervention: Exercise  Activity: Pt will throw two dice and depending on the number they roll they will do the exercise that corresponds with that number.  Education: Exercise, Discharge Planning  Education Outcome: Acknowledges understanding  Clinical Observations/Feedback: Pt participated in different exercises such as jumping jacks and rotating arms forward. Pt identified that she participates in activities because it is good for your body and it prolongs life.  Donovan Kail, Recreation Therapy Intern  Victorino Sparrow, LRT/CTRS

## 2017-07-02 NOTE — Discharge Summary (Signed)
Physician Discharge Summary Note  Patient:  Stephanie Serrano is an 51 y.o., female MRN:  [id number] DOB:  1966-10-06 Patient phone:  [phone] (home)  Patient address:   [address] [address],   Total Time spent with patient: Greater than 30 minutes  Date of Admission:  06/28/2017 Date of Discharge: 07-02-17   Reason for Admission: Worsening symptoms of depression/split personality issues.  Principal Problem: MDD (major depressive disorder), recurrent episode, moderate ([place])  Discharge Diagnoses: Patient Active Problem List   Diagnosis Date Noted  . MDD (major depressive disorder), recurrent episode, moderate ([place]) [F33.1] 06/28/2017  . OCD (obsessive compulsive disorder) [F42.9] 03/26/2017  . Insomnia [G47.00] 02/08/2016  . Heel spur [M77.30] 09/01/2015  . Family history of cystitis [Z84.2] 08/31/2015  . Left foot pain [M79.672] 08/31/2015  . Prediabetes [R73.03] 08/23/2015  . Hyperlipidemia [E78.5] 08/23/2015  . Depression [F32.9] 03/21/2015  . Acute esophagitis [K20.9] 02/09/2015  . Gastroesophageal reflux disease without esophagitis [K21.9] 08/27/2014  . Dysphagia, unspecified(787.20) [R13.10] 03/22/2014   Past Psychiatric History: Major depressive disorder, OCD.  Past Medical History:  Past Medical History:  Diagnosis Date  . Anxiety   . Asthma   . Depression   . Esophageal stricture   . Migraines   . OCD (obsessive compulsive disorder)   . Plantar fasciitis of right foot   . RLS (restless legs syndrome)   . Vertigo     Past Surgical History:  Procedure Laterality Date  . ESOPHAGOGASTRODUODENOSCOPY N/A 02/09/2015   Procedure: ESOPHAGOGASTRODUODENOSCOPY (EGD);  Surgeon: Inda Castle, MD;  Location: [facility];  Service: Endoscopy;  Laterality: N/A;  with dilation  . FOOT SURGERY    . NASAL SINUS SURGERY    . PLANTAR FASCIA RELEASE Right 08/03/2016   Procedure: PLANTAR FASCIA RELEASE WITH BONE SPUR EXCISION;  Surgeon: Dorna Leitz,  MD;  Location: [facility];  Service: Orthopedics;  Laterality: Right;   Family History:  Family History  Problem Relation Age of Onset  . Pancreatic cancer Father   . Diabetes Mother   . Heart disease Mother   . Anxiety disorder Brother   . Diabetes Paternal Grandmother   . Esophageal cancer Neg Hx   . Stomach cancer Neg Hx    Family Psychiatric  History: See H&P  Social History:  History  Alcohol Use  . Yes    Comment: socially     History  Drug Use No    Social History   Social History  . Marital status: Divorced    Spouse name: N/A  . Number of children: 2  . Years of education: N/A   Occupational History  . [employer]   Social History Main Topics  . Smoking status: Never Smoker  . Smokeless tobacco: Never Used  . Alcohol use Yes     Comment: socially  . Drug use: No  . Sexual activity: No   Other Topics Concern  . None   Social History Narrative  . None   Hospital Course: Stephanie Serrano is a 51 y.o. female. 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 She states that [name] makes her want to harm herself and others. Pt states that [name] does not like when people are "mean to her." Pt states that she likes [name] and [name] because they are nice to her. According  to the Pt, she went to [place] to visit her family last week and "stuff came up.  Pt states when she returned home [name] and [name] "appeared." Pt denies previous experiences with [name] and [name] until then. The Pt states she is receiving outpatient therapy from Dr. Melina Copa and Valora Piccolo.  After the above admission notes, Stephanie Serrano was started on medication regimen for her presenting symptoms. She received & was discharged on Prozac 60 mg for depression, Hydroxyzine 50 mg prn for anxiety, Lamictal 50 mg for mood stabilization &  Trazodone 50 mg for insomnia. She was also enrolled & participated in the group counseling sessions being offered & held on this unit. She presented other significant health issues that required treatment & or monitoring. Stephanie Serrano was resumed on all her pertinent home medications for those health issues. She tolerated his treatment regimen without any adverse effects or reactions reported.  At her discharge assessment/interview today with her attending psychiatrist, Danny reports feeling much better. She states that being on the medications has helped her a lot. She says she feels she is back to her old self. No thoughts of suicide.  No thoughts of homicide. No thoughts of violence. No access to weapons.    [name] reports that she is in good spirits. Not feeling depressed. Reports normal energy and interest. Has been maintaining normal biological functions. She is able to think clearly. She is able to focus on task. Her thoughts are not crowded or racing. No evidence of mania. No hallucination in any modality. She is not making any delusional statement. No passivity of will/thought. She is fully in touch with reality. No overwhelming anxiety.   Stephanie Serrano's case was discussed at the treatment team meeting today. Nursing staff notes that she has been bright today. She has not been observed to be internally disturbed. She has not voiced any suicidal thoughts today. Patient has been cooperative with care and has tolerated her medications well. Team members feels that patient is back to her baseline level of function. Team agrees with plan to discharge patient today to continue mental health care on an outpatient basis as noted below.  She left [facility] with all personal belongings in no apparent distress. Transportation per herself.   Physical Findings: AIMS: Facial and Oral Movements Muscles of Facial Expression: None, normal Lips and Perioral Area: None, normal Jaw: None, normal Tongue: None, normal,Extremity  Movements Upper (arms, wrists, hands, fingers): None, normal Lower (legs, knees, ankles, toes): None, normal, Trunk Movements Neck, shoulders, hips: None, normal, Overall Severity Severity of abnormal movements (highest score from questions above): None, normal Incapacitation due to abnormal movements: None, normal Patient's awareness of abnormal movements (rate only patient's report): No Awareness, Dental Status Current problems with teeth and/or dentures?: No Does patient usually wear dentures?: No  CIWA:  CIWA-Ar Total: 1 COWS:  COWS Total Score: 2  Musculoskeletal: Strength & Muscle Tone: within normal limits Gait & Station: normal Patient leans: N/A  Psychiatric Specialty Exam: Physical Exam  Constitutional: She appears well-developed.  HENT:  Head: Normocephalic.  Eyes: Pupils are equal, round, and reactive to light.  Neck: Normal range of motion.  Cardiovascular: Normal rate.   Respiratory: Effort normal.  GI: Soft.  Genitourinary:  Genitourinary Comments: Deferred  Musculoskeletal: Normal range of motion.  Neurological: She is alert.  Skin: Skin is warm.    Review of Systems  Constitutional: Negative.   HENT: Negative.   Eyes: Negative.   Respiratory: Negative.   Cardiovascular: Negative.   Gastrointestinal: Negative.  Genitourinary: Negative.   Musculoskeletal: Negative.   Skin: Negative.   Neurological: Negative.   Endo/Heme/Allergies: Negative.   Psychiatric/Behavioral: Positive for depression (Stable) and hallucinations (Hx). Negative for memory loss, substance abuse and suicidal ideas. The patient has insomnia (Stable). The patient is not nervous/anxious.     Blood pressure 121/78, pulse 77, temperature 98.3 F (36.8 C), resp. rate 16, height 5' 2.5" (1.588 m), weight 78.9 kg (174 lb), SpO2 100 %.Body mass index is 31.32 kg/m.  See H&P   Have you used any form of tobacco in the last 30 days? (Cigarettes, Smokeless Tobacco, Cigars, and/or Pipes): No   Has this patient used any form of tobacco in the last 30 days? (Cigarettes, Smokeless Tobacco, Cigars, and/or Pipes): No  Blood Alcohol level:  Lab Results  Component Value Date   ETH <5 85/27/7824   Metabolic Disorder Labs:  Lab Results  Component Value Date   HGBA1C 5.4 10/26/2016   MPG 108 10/26/2016   MPG 117 (H) 08/22/2015   No results found for: PROLACTIN Lab Results  Component Value Date   CHOL 270 (H) 10/26/2016   TRIG 100 10/26/2016   HDL 69 10/26/2016   CHOLHDL 3.9 10/26/2016   VLDL 20 10/26/2016   LDLCALC 181 (H) 10/26/2016   LDLCALC 155 (H) 08/22/2015   See Psychiatric Specialty Exam and Suicide Risk Assessment completed by Attending Physician prior to discharge.  Discharge destination:  Home  Is patient on multiple antipsychotic therapies at discharge:  No   Has Patient had three or more failed trials of antipsychotic monotherapy by history:  No  Recommended Plan for Multiple Antipsychotic Therapies: NA  Allergies as of 07/02/2017      Reactions   Azithromycin    Clindamycin/lincomycin    Penicillins    Has patient had a PCN reaction causing immediate rash, facial/tongue/throat swelling, SOB or lightheadedness with hypotension: yes Has patient had a PCN reaction causing severe rash involving mucus membranes or skin necrosis: no Has patient had a PCN reaction that required hospitalization: no Has patient had a PCN reaction occurring within the last 10 years: no If all of the above answers are "NO", then may proceed with Cephalosporin use.   Sulfa Antibiotics    Vicodin [hydrocodone-acetaminophen] Nausea Only      Medication List    STOP taking these medications   amitriptyline 25 MG tablet Commonly known as:  ELAVIL   docusate sodium 100 MG capsule Commonly known as:  COLACE   predniSONE 50 MG tablet Commonly known as:  DELTASONE   SHINGRIX injection Generic drug:  Zoster Vac Recomb Adjuvanted     TAKE these medications     Indication   albuterol 108 (90 Base) MCG/ACT inhaler Commonly known as:  PROVENTIL HFA;VENTOLIN HFA Inhale 2 puffs into the lungs every 6 (six) hours as needed for wheezing or shortness of breath. What changed:  how much to take  Indication:  Asthma   FLUoxetine 20 MG capsule Commonly known as:  PROZAC Take 3 capsules (60 mg total) by mouth daily. For depression What changed:  medication strength  how much to take  additional instructions  Indication:  Major Depressive Disorder   hydrOXYzine 50 MG tablet Commonly known as:  ATARAX/VISTARIL Take 1 tablet (50 mg total) by mouth every 6 (six) hours as needed for anxiety.  Indication:  Feeling Anxious   lamoTRIgine 25 MG tablet Commonly known as:  LAMICTAL Take 2 tablets (50 mg total) by mouth at bedtime. For mood stabilization  Indication:  Mood stabilization   linaclotide 72 MCG capsule Commonly known as:  LINZESS TAKE 1 CAPSULE BY MOUTH DAILY BEFORE BREAKFAST: For chronic constipation What changed:  See the new instructions.  Indication:  Chronic Constipation of Unknown Cause   ondansetron 4 MG tablet Commonly known as:  ZOFRAN Take 1 tablet (4 mg total) by mouth every 6 (six) hours as needed for nausea or vomiting.  Indication:  Nausea, vomiting   traZODone 50 MG tablet Commonly known as:  DESYREL Take 1 tablet (50 mg total) by mouth at bedtime as needed for sleep. What changed:  when to take this  reasons to take this  Indication:  Trouble Sleeping      Follow-up Information    [facility] Follow up on 07/04/2017.   Specialty:  Behavioral Health Why:  Patient current w Dr Orlena Sheldon for medication management.  Next appointment is 7/26 at 10:45 w Dr Melina Copa.  Contact information: [place] [address] [phone]        [facility] L[facility] Follow up on 07/03/2017.   Why:  Patient current w this therapist, next appointment w 7/25 at 1 PM.  Please call to cancel/reschedule if needed.   Contact information: [address] [place]  [address] Phone:  [phone] Fax:  [phone]         Follow-up recommendations: Activity:  As tolerated Diet: As recommended by your primary care doctor. Keep all scheduled follow-up appointments as recommended.   Comments: Patient is instructed prior to discharge to: Take all medications as prescribed by his/her mental healthcare provider. Report any adverse effects and or reactions from the medicines to his/her outpatient provider promptly. Patient has been instructed & cautioned: To not engage in alcohol and or illegal drug use while on prescription medicines. In the event of worsening symptoms, patient is instructed to call the crisis hotline, 911 and or go to the nearest ED for appropriate evaluation and treatment of symptoms. To follow-up with his/her primary care provider for your other medical issues, concerns and or health care needs.  Signed: Encarnacion Slates, NP, PMHNP, FNP-BC 07/02/2017, 9:40 AM

## 2017-07-28 ENCOUNTER — Other Ambulatory Visit (HOSPITAL_COMMUNITY): Payer: Self-pay | Admitting: Psychiatry

## 2017-08-19 ENCOUNTER — Other Ambulatory Visit: Payer: Self-pay | Admitting: Family Medicine

## 2017-08-19 DIAGNOSIS — K5900 Constipation, unspecified: Secondary | ICD-10-CM

## 2017-08-29 ENCOUNTER — Telehealth: Payer: Self-pay

## 2017-08-29 NOTE — Telephone Encounter (Signed)
I see that patient was referred for plantar fascitis more than 1 years ago. If she is still experiencing food problems, she will need to schedule an office visit.

## 2017-09-02 NOTE — Telephone Encounter (Signed)
Patient notified and will call and schedule

## 2017-09-02 NOTE — Telephone Encounter (Signed)
Duplicate

## 2017-09-05 ENCOUNTER — Encounter: Payer: Self-pay | Admitting: Family Medicine

## 2017-09-05 ENCOUNTER — Ambulatory Visit (INDEPENDENT_AMBULATORY_CARE_PROVIDER_SITE_OTHER): Payer: Medicaid Other | Admitting: Family Medicine

## 2017-09-05 VITALS — BP 115/74 | HR 86 | Temp 98.1°F | Resp 14 | Ht 62.5 in | Wt 178.0 lb

## 2017-09-05 DIAGNOSIS — L2089 Other atopic dermatitis: Secondary | ICD-10-CM

## 2017-09-05 DIAGNOSIS — M79674 Pain in right toe(s): Secondary | ICD-10-CM | POA: Diagnosis not present

## 2017-09-05 DIAGNOSIS — M79675 Pain in left toe(s): Secondary | ICD-10-CM

## 2017-09-05 NOTE — Progress Notes (Signed)
Patient ID: Stephanie Serrano, female    DOB: [birth year], 51 y.o.   MRN: [id number]  PCP: Scot Jun, FNP  Chief Complaint  Patient presents with  . Rash  . Toe Pain     Subjective:  HPI Stephanie Serrano is a 51 y.o. female presents for evaluation of rash and toe pain. Stephanie Serrano was recently referred to the dermatology for evaluation of chronic dermatitis. She reports today that she was dissatisfied with dermatologist explanation for chronic dermatitis and feels that she is allergic to an environmental allergy and requests a allergy referral. She continues to experience dermatitis of scalp in spite of treatment. Stephanie Serrano has a second complaint of bilateral toe pain. Bilateral toe pain is exacerbated by wearing closed toe shoes. 4th digit of right toe causes her to experience the most pain. Denies any prior injuries or fractures. Social History   Social History  . Marital status: Divorced    Spouse name: N/A  . Number of children: 2  . Years of education: N/A   Occupational History  . [employer]   Social History Main Topics  . Smoking status: Never Smoker  . Smokeless tobacco: Never Used  . Alcohol use Yes     Comment: socially  . Drug use: No  . Sexual activity: No   Other Topics Concern  . Not on file   Social History Narrative  . No narrative on file    Family History  Problem Relation Age of Onset  . Pancreatic cancer Father   . Diabetes Mother   . Heart disease Mother   . Anxiety disorder Brother   . Diabetes Paternal Grandmother   . Esophageal cancer Neg Hx   . Stomach cancer Neg Hx    Review of Systems See HPI  Patient Active Problem List   Diagnosis Date Noted  . MDD (major depressive disorder), recurrent episode, moderate ([place]) 06/28/2017  . OCD (obsessive compulsive disorder) 03/26/2017  . Obstructive sleep apnea of adult 01/18/2017  . Insomnia 02/08/2016  . Heel spur 09/01/2015  . Family history of cystitis 08/31/2015  . Left foot pain  08/31/2015  . Prediabetes 08/23/2015  . Hyperlipidemia 08/23/2015  . Depression 03/21/2015  . Acute esophagitis 02/09/2015  . Gastroesophageal reflux disease without esophagitis 08/27/2014  . Dysphagia, unspecified(787.20) 03/22/2014    Allergies  Allergen Reactions  . Azithromycin   . Clindamycin/Lincomycin   . Penicillins     Has patient had a PCN reaction causing immediate rash, facial/tongue/throat swelling, SOB or lightheadedness with hypotension: yes Has patient had a PCN reaction causing severe rash involving mucus membranes or skin necrosis: no Has patient had a PCN reaction that required hospitalization: no Has patient had a PCN reaction occurring within the last 10 years: no If all of the above answers are "NO", then may proceed with Cephalosporin use.   . Sulfa Antibiotics   . Vicodin [Hydrocodone-Acetaminophen] Nausea Only    Prior to Admission medications   Medication Sig Start Date End Date Taking? Authorizing Provider  albuterol (PROVENTIL HFA;VENTOLIN HFA) 108 (90 Base) MCG/ACT inhaler Inhale 2 puffs into the lungs every 6 (six) hours as needed for wheezing or shortness of breath. 07/02/17  Yes Nwoko, Herbert Pun I, NP  FLUoxetine (PROZAC) 20 MG capsule Take 3 capsules (60 mg total) by mouth daily. For depression 07/03/17  Yes Lindell Spar I, NP  hydrOXYzine (ATARAX/VISTARIL) 50 MG tablet Take 1 tablet (50 mg total) by mouth every 6 (six) hours as needed for anxiety. 07/02/17  Yes Encarnacion Slates, NP  LINZESS 72 MCG capsule TAKE 1 CAPSULE BY MOUTH DAILY BEFORE BREAKFAST 08/19/17  Yes Scot Jun, FNP  ondansetron (ZOFRAN) 4 MG tablet Take 1 tablet (4 mg total) by mouth every 6 (six) hours as needed for nausea or vomiting. 07/02/17  Yes Lindell Spar I, NP  traZODone (DESYREL) 50 MG tablet Take 1 tablet (50 mg total) by mouth at bedtime as needed for sleep. 07/02/17  Yes Lindell Spar I, NP  lamoTRIgine (LAMICTAL) 25 MG tablet TAKE 2 TABLETS BY MOUTH EVERY NIGHT AT BEDTIME FOR  MOOD STABILIZATION Patient not taking: Reported on 09/05/2017 07/29/17   Hampton Abbot, MD    Past Medical, Surgical Family and Social History reviewed and updated.    Objective:   Today's Vitals   09/05/17 1023  BP: 115/74  Pulse: 86  Resp: 14  Temp: 98.1 F (36.7 C)  TempSrc: Oral  SpO2: 96%  Weight: 178 lb (80.7 kg)  Height: 5' 2.5" (1.588 m)    Wt Readings from Last 3 Encounters:  09/05/17 178 lb (80.7 kg)  05/17/17 171 lb (77.6 kg)  03/21/17 173 lb (78.5 kg)   Physical Exam  Constitutional: She is oriented to person, place, and time. She appears well-developed and well-nourished.  HENT:  Head: Normocephalic and atraumatic.  Right Ear: External ear normal.  Left Ear: External ear normal.  Mouth/Throat: Oropharynx is clear and moist.  Eyes: Pupils are equal, round, and reactive to light. Conjunctivae are normal.  Neck: Normal range of motion. Neck supple.  Cardiovascular: Normal rate and regular rhythm.   Pulmonary/Chest: Effort normal and breath sounds normal.  Musculoskeletal: Normal range of motion.  Neurological: She is alert and oriented to person, place, and time.  Skin: Skin is warm and dry.  No evidence of dander or scalp dryness   Psychiatric: She has a normal mood and affect. Her behavior is normal. Judgment and thought content normal.    Assessment & Plan:  1. Other atopic dermatitis,  Ambulatory referral to Allergy, for environmental and food allergy testing. 2. Toe pain, bilateral, normal appearing toes and feet. Unremarkable physical exam. Will refer to podiatry for second opinion. Ambulatory referral to Podiatry  RTC: As needed  Carroll Sage. Kenton Kingfisher, MSN, FNP-C [facility]  [address] [place], [place] [address] [phone]

## 2017-09-05 NOTE — Patient Instructions (Signed)
Allergy Test Why am I having this test? This test helps diagnose specific allergies that are causing different symptoms, such as:  Rashes.  Runny nose.  Sneezing.  Asthma flare-ups.  It is often used when skin testing for allergies is not an option. This test measures your level of immunoglobulin E (IgE). This determines exactly what substance you are allergic to. A common method used to measure IgE is the radioallergosorbent test (RAST) in which specific allergens are tested. You can be tested for some allergens, including:  Animal dander.  Food.  Pollens.  Dusts.  Latex.  Molds.  Insect venoms.  Medicines.  What kind of sample is taken? A blood sample is required for this test. It is usually collected by inserting a needle into a vein. How do I prepare for this test? There is no preparation required for this test. What are the reference ranges? Reference ranges are considered healthy ranges established after testing a large group of healthy people. Reference ranges may vary among different people, labs, and hospitals. It is your responsibility to obtain your test results. Ask the lab or department performing the test when and how you will get your results. Reference ranges for IgE are listed here:  Adult: 0-100 International Units/mL.  Child 0-23 months: 0-13 International Units/mL.  Child 2-5 years: 0-56 International Units/mL.  Child 6-10 years: 0-85 International Units/mL.  Reference ranges for RAST are listed here:  RAST rating 0: Less than 0.35 KU/L.  RAST rating 1: 0.35-0.69 KU/L.  RAST rating 2: 0.70-3.49 KU/L.  RAST rating 3: 3.50-17.49 KU/L.  RAST rating 4: 17.50-49.99 KU/L.  RAST rating 5: 50-100 KU/L.  RAST rating 6: Greater than 100 KU/L.  What do the results mean? A RAST rating of:  0 means you have an absent or undetectable allergen-specific IgE.  1 means you have a low level of allergen-specific IgE.  2 means you have a moderate  level of allergen-specific IgE.  3 means you have a high level of allergen-specific IgE.  4 means you have a very high level of allergen-specific IgE.  5 means you have a very high level of allergen-specific IgE.  6 means you have an extremely high level of allergen-specific IgE.  Talk with your health care provider to discuss your results, treatment options, and if necessary, the need for more tests. Talk with your health care provider if you have any questions about your results. Talk with your health care provider to discuss your results, treatment options, and if necessary, the need for more tests. Talk with your health care provider if you have any questions about your results. This information is not intended to replace advice given to you by your health care provider. Make sure you discuss any questions you have with your health care provider. Document Released: 12/18/2004 Document Revised: 07/30/2016 Document Reviewed: 04/30/2014 Elsevier Interactive Patient Education  [employer].

## 2017-09-17 ENCOUNTER — Ambulatory Visit (INDEPENDENT_AMBULATORY_CARE_PROVIDER_SITE_OTHER): Payer: Medicaid Other

## 2017-09-17 ENCOUNTER — Encounter: Payer: Self-pay | Admitting: Podiatry

## 2017-09-17 ENCOUNTER — Ambulatory Visit (INDEPENDENT_AMBULATORY_CARE_PROVIDER_SITE_OTHER): Payer: Medicaid Other | Admitting: Podiatry

## 2017-09-17 VITALS — BP 126/75 | HR 83 | Resp 16

## 2017-09-17 DIAGNOSIS — M2042 Other hammer toe(s) (acquired), left foot: Secondary | ICD-10-CM

## 2017-09-17 DIAGNOSIS — M205X2 Other deformities of toe(s) (acquired), left foot: Secondary | ICD-10-CM | POA: Diagnosis not present

## 2017-09-17 DIAGNOSIS — M216X2 Other acquired deformities of left foot: Secondary | ICD-10-CM | POA: Diagnosis not present

## 2017-09-17 DIAGNOSIS — M2041 Other hammer toe(s) (acquired), right foot: Secondary | ICD-10-CM | POA: Diagnosis not present

## 2017-09-17 NOTE — Progress Notes (Signed)
Subjective:  Patient ID: Stephanie Serrano, female    DOB: 1966-07-12,  MRN: [id number] HPI Chief Complaint  Patient presents with  . Toe Pain    4th toe bilateral (L>R) - toes are curled and rubbing against shoes, even hits 4th toe left on furniture, tried inserts, cushions and dividers-no help    51 y.o. female presents with the above complaint.     Past Medical History:  Diagnosis Date  . Anxiety   . Asthma   . Depression   . Esophageal stricture   . Migraines   . OCD (obsessive compulsive disorder)   . Plantar fasciitis of right foot   . RLS (restless legs syndrome)   . Vertigo    Past Surgical History:  Procedure Laterality Date  . ESOPHAGOGASTRODUODENOSCOPY N/A 02/09/2015   Procedure: ESOPHAGOGASTRODUODENOSCOPY (EGD);  Surgeon: Inda Castle, MD;  Location: [facility];  Service: Endoscopy;  Laterality: N/A;  with dilation  . FOOT SURGERY    . NASAL SINUS SURGERY    . PLANTAR FASCIA RELEASE Right 08/03/2016   Procedure: PLANTAR FASCIA RELEASE WITH BONE SPUR EXCISION;  Surgeon: Dorna Leitz, MD;  Location: [place];  Service: Orthopedics;  Laterality: Right;    Current Outpatient Prescriptions:  .  CarBAMazepine (TEGRETOL PO), Take by mouth., Disp: , Rfl:  .  albuterol (PROVENTIL HFA;VENTOLIN HFA) 108 (90 Base) MCG/ACT inhaler, Inhale 2 puffs into the lungs every 6 (six) hours as needed for wheezing or shortness of breath., Disp: , Rfl:  .  clonazePAM (KLONOPIN) 0.5 MG tablet, Take 0.5 mg by mouth 2 (two) times daily., Disp: , Rfl: 0 .  FLUoxetine (PROZAC) 20 MG capsule, Take 3 capsules (60 mg total) by mouth daily. For depression, Disp: 30 capsule, Rfl: 0 .  hydrOXYzine (ATARAX/VISTARIL) 50 MG tablet, Take 1 tablet (50 mg total) by mouth every 6 (six) hours as needed for anxiety., Disp: 60 tablet, Rfl: 0 .  LINZESS 72 MCG capsule, TAKE 1 CAPSULE BY MOUTH DAILY BEFORE BREAKFAST, Disp: 30 capsule, Rfl: 0 .  ondansetron (ZOFRAN) 4 MG tablet, Take 1 tablet  (4 mg total) by mouth every 6 (six) hours as needed for nausea or vomiting., Disp: 1 tablet, Rfl: 0 .  traZODone (DESYREL) 50 MG tablet, Take 1 tablet (50 mg total) by mouth at bedtime as needed for sleep., Disp: 30 tablet, Rfl: 0  Allergies  Allergen Reactions  . Azithromycin   . Clindamycin/Lincomycin   . Penicillins     Has patient had a PCN reaction causing immediate rash, facial/tongue/throat swelling, SOB or lightheadedness with hypotension: yes Has patient had a PCN reaction causing severe rash involving mucus membranes or skin necrosis: no Has patient had a PCN reaction that required hospitalization: no Has patient had a PCN reaction occurring within the last 10 years: no If all of the above answers are "NO", then may proceed with Cephalosporin use.   . Sulfa Antibiotics   . Vicodin [Hydrocodone-Acetaminophen] Nausea Only   Review of Systems  Respiratory: Positive for apnea.   Psychiatric/Behavioral: Positive for behavioral problems.  All other systems reviewed and are negative.  Objective:   Vitals:   09/17/17 0948  BP: 126/75  Pulse: 83  Resp: 16    General: Well developed, nourished, in no acute distress, alert and oriented x3   Dermatological: Skin is warm, dry and supple bilateral. Nails x 10 are well maintained; remaining integument appears unremarkable at this time. There are no open sores, no preulcerative lesions, no rash or  signs of infection present.  Vascular: Dorsalis Pedis artery and Posterior Tibial artery pedal pulses are 2/4 bilateral with immedate capillary fill time. Pedal hair growth present. No varicosities and no lower extremity edema present bilateral.   Neruologic: Grossly intact via light touch bilateral. Vibratory intact via tuning fork bilateral. Protective threshold with Semmes Wienstein monofilament intact to all pedal sites bilateral. Patellar and Achilles deep tendon reflexes 2+ bilateral. No Babinski or clonus noted bilateral.    Musculoskeletal: No gross boney pedal deformities bilateral. No pain, crepitus, or limitation noted with foot and ankle range of motion bilateral. Muscular strength 5/5 in all groups tested bilateral. She demonstrates mild tailor's bunion deformities with adductor varus rotated hammertoe deformities fifth bilateral. She also has flexor contractions at the level of the third and fourth DIPJ's bilaterally. These are painful on palpation as well as reduction.  Gait: Unassisted, Nonantalgic.    Radiographs:  3 views bilateral foot taken today demonstrate tailor's bunion deformities with adductor varus rotated hammertoe deformities fifth bilateral left greater than right. No open lesions or wounds no fractures identified.  Assessment & Plan:   Assessment: Tailor's bunion deformities hammertoe deformities fifth bilateral and mallet toe deformities #3 #4 bilaterally left greater than right.  Plan: We reviewed a consent form with her today. We answered all questions regarding fifth metatarsal osteotomy hammertoe repair #3 #4 #5 to the best serve up our ability in layman's terms. She understood this was amenable to it and signed all 3 pages of the consent form. We did discuss the possible postop complications which may include but are not limited to postoperative pain bleeding swelling infection recurrence and need for further surgery. We dispensed a cam walker she was thoroughly detailed on anesthesia as well as the surgery center. She was then sent to schedule for surgery scheduled.     Max T. [place], [place]

## 2017-09-17 NOTE — Patient Instructions (Signed)
Pre-Operative Instructions  Congratulations, you have decided to take an important step towards improving your quality of life.  You can be assured that the doctors and staff at [facility] will be with you every step of the way.  Here are some important things you should know:  1. Plan to be at the surgery center/hospital at least 1 (one) hour prior to your scheduled time, unless otherwise directed by the surgical center/hospital staff.  You must have a responsible adult accompany you, remain during the surgery and drive you home.  Make sure you have directions to the surgical center/hospital to ensure you arrive on time. 2. If you are having surgery at [facility] or [facility], you will need a copy of your medical history and physical form from your family physician within one month prior to the date of surgery. We will give you a form for your primary physician to complete.  3. We make every effort to accommodate the date you request for surgery.  However, there are times where surgery dates or times have to be moved.  We will contact you as soon as possible if a change in schedule is required.   4. No aspirin/ibuprofen for one week before surgery.  If you are on aspirin, any non-steroidal anti-inflammatory medications (Mobic, Aleve, Ibuprofen) should not be taken seven (7) days prior to your surgery.  You make take Tylenol for pain prior to surgery.  5. Medications - If you are taking daily heart and blood pressure medications, seizure, reflux, allergy, asthma, anxiety, pain or diabetes medications, make sure you notify the surgery center/hospital before the day of surgery so they can tell you which medications you should take or avoid the day of surgery. 6. No food or drink after midnight the night before surgery unless directed otherwise by surgical center/hospital staff. 7. No alcoholic beverages 24-hours prior to surgery.  No smoking 24-hours prior or 24-hours after  surgery. 8. Wear loose pants or shorts. They should be loose enough to fit over bandages, boots, and casts. 9. Don't wear slip-on shoes. Sneakers are preferred. 10. Bring your boot with you to the surgery center/hospital.  Also bring crutches or a walker if your physician has prescribed it for you.  If you do not have this equipment, it will be provided for you after surgery. 11. If you have not been contacted by the surgery center/hospital by the day before your surgery, call to confirm the date and time of your surgery. 12. Leave-time from work may vary depending on the type of surgery you have.  Appropriate arrangements should be made prior to surgery with your employer. 13. Prescriptions will be provided immediately following surgery by your doctor.  Fill these as soon as possible after surgery and take the medication as directed. Pain medications will not be refilled on weekends and must be approved by the doctor. 14. Remove nail polish on the operative foot and avoid getting pedicures prior to surgery. 15. Wash the night before surgery.  The night before surgery wash the foot and leg well with water and the antibacterial soap provided. Be sure to pay special attention to beneath the toenails and in between the toes.  Wash for at least three (3) minutes. Rinse thoroughly with water and dry well with a towel.  Perform this wash unless told not to do so by your physician.  Enclosed: 1 Ice pack (please put in freezer the night before surgery)   1 Hibiclens skin cleaner     Pre-op instructions  If you have any questions regarding the instructions, please do not hesitate to call our office.  [place]: [address], [place], [place] [address] -- [phone]  [place]: [address] , [place] [address] -- [phone]  [place]: [address]  [place], [place] [address] -- [phone]  [place]: [address], [address], [place], [place] [address] -- [phone]  Website:  [email or web address] 

## 2017-09-18 ENCOUNTER — Other Ambulatory Visit: Payer: Self-pay | Admitting: Family Medicine

## 2017-09-18 DIAGNOSIS — K5900 Constipation, unspecified: Secondary | ICD-10-CM

## 2017-09-25 ENCOUNTER — Other Ambulatory Visit: Payer: Self-pay | Admitting: Podiatry

## 2017-09-25 MED ORDER — HYDROMORPHONE HCL 4 MG PO TABS: 4.0000 mg | ORAL_TABLET | ORAL | 0 refills | Status: DC | PRN

## 2017-09-25 MED ORDER — PROMETHAZINE HCL 25 MG PO TABS: 25.0000 mg | ORAL_TABLET | Freq: Three times a day (TID) | ORAL | 0 refills | Status: DC | PRN

## 2017-09-25 MED ORDER — DOXYCYCLINE HYCLATE 100 MG PO TABS: 100.0000 mg | ORAL_TABLET | Freq: Two times a day (BID) | ORAL | 0 refills | Status: DC

## 2017-09-27 ENCOUNTER — Encounter: Payer: Self-pay | Admitting: Podiatry

## 2017-09-27 DIAGNOSIS — M2042 Other hammer toe(s) (acquired), left foot: Secondary | ICD-10-CM | POA: Diagnosis not present

## 2017-09-27 DIAGNOSIS — M21542 Acquired clubfoot, left foot: Secondary | ICD-10-CM

## 2017-10-02 ENCOUNTER — Emergency Department (HOSPITAL_COMMUNITY): Payer: Medicaid Other

## 2017-10-02 ENCOUNTER — Encounter (HOSPITAL_COMMUNITY): Payer: Self-pay

## 2017-10-02 ENCOUNTER — Emergency Department (HOSPITAL_COMMUNITY)
Admission: EM | Admit: 2017-10-02 | Discharge: 2017-10-03 | Disposition: A | Discharge status: Home / Self Care | Payer: Medicaid Other | Attending: Physician Assistant | Admitting: Physician Assistant

## 2017-10-02 ENCOUNTER — Telehealth: Payer: Self-pay | Admitting: *Deleted

## 2017-10-02 DIAGNOSIS — Z79899 Other long term (current) drug therapy: Secondary | ICD-10-CM | POA: Diagnosis not present

## 2017-10-02 DIAGNOSIS — M533 Sacrococcygeal disorders, not elsewhere classified: Secondary | ICD-10-CM

## 2017-10-02 DIAGNOSIS — R42 Dizziness and giddiness: Secondary | ICD-10-CM | POA: Diagnosis not present

## 2017-10-02 DIAGNOSIS — M79662 Pain in left lower leg: Secondary | ICD-10-CM | POA: Diagnosis not present

## 2017-10-02 DIAGNOSIS — M79605 Pain in left leg: Secondary | ICD-10-CM | POA: Diagnosis present

## 2017-10-02 DIAGNOSIS — J45909 Unspecified asthma, uncomplicated: Secondary | ICD-10-CM | POA: Insufficient documentation

## 2017-10-02 LAB — COMPREHENSIVE METABOLIC PANEL
ALBUMIN: 3.4 g/dL — AB (ref 3.5–5.0)
ALK PHOS: 81 U/L (ref 38–126)
ALT: 16 U/L (ref 14–54)
ANION GAP: 8 (ref 5–15)
AST: 20 U/L (ref 15–41)
BUN: 9 mg/dL (ref 6–20)
CALCIUM: 8.7 mg/dL — AB (ref 8.9–10.3)
CO2: 29 mmol/L (ref 22–32)
Chloride: 102 mmol/L (ref 101–111)
Creatinine, Ser: 0.7 mg/dL (ref 0.44–1.00)
GFR calc Af Amer: >60 mL/min (ref >60)
GFR calc non Af Amer: >60 mL/min (ref >60)
GLUCOSE: 136 mg/dL — AB (ref 65–99)
Potassium: 3.8 mmol/L (ref 3.5–5.1)
SODIUM: 139 mmol/L (ref 135–145)
Total Bilirubin: 0.4 mg/dL (ref 0.3–1.2)
Total Protein: 6.3 g/dL — ABNORMAL LOW (ref 6.5–8.1)

## 2017-10-02 LAB — CBC WITH DIFFERENTIAL/PLATELET
Basophils Absolute: 0 10*3/uL (ref 0.0–0.1)
Basophils Relative: 0 %
Eosinophils Absolute: 0.1 10*3/uL (ref 0.0–0.7)
Eosinophils Relative: 2 %
HEMATOCRIT: 34.8 % — AB (ref 36.0–46.0)
Hemoglobin: 11.8 g/dL — ABNORMAL LOW (ref 12.0–15.0)
LYMPHS PCT: 21 %
Lymphs Abs: 1.3 10*3/uL (ref 0.7–4.0)
MCH: 29.9 pg (ref 26.0–34.0)
MCHC: 33.9 g/dL (ref 30.0–36.0)
MCV: 88.1 fL (ref 78.0–100.0)
MONO ABS: 0.6 10*3/uL (ref 0.1–1.0)
MONOS PCT: 10 %
NEUTROS ABS: 4.1 10*3/uL (ref 1.7–7.7)
Neutrophils Relative %: 67 %
Platelets: 203 10*3/uL (ref 150–400)
RBC: 3.95 MIL/uL (ref 3.87–5.11)
RDW: 13.2 % (ref 11.5–15.5)
WBC: 6.1 10*3/uL (ref 4.0–10.5)

## 2017-10-02 MED ORDER — METOCLOPRAMIDE HCL 5 MG/ML IJ SOLN
5.0000 mg | Freq: Once | INTRAMUSCULAR | Status: AC
  Administered 2017-10-02: 5 mg via INTRAVENOUS
  Filled 2017-10-02: qty 2

## 2017-10-02 MED ORDER — KETOROLAC TROMETHAMINE 30 MG/ML IJ SOLN
30.0000 mg | Freq: Once | INTRAMUSCULAR | Status: AC
  Administered 2017-10-02: 30 mg via INTRAVENOUS
  Filled 2017-10-02: qty 1

## 2017-10-02 MED ORDER — FLEET ENEMA 7-19 GM/118ML RE ENEM
1.0000 | ENEMA | Freq: Once | RECTAL | Status: AC
  Administered 2017-10-02: 1 via RECTAL
  Filled 2017-10-02: qty 1

## 2017-10-02 MED ORDER — POLYETHYLENE GLYCOL 3350 17 G PO PACK: 17.0000 g | PACK | Freq: Every day | ORAL | 0 refills | Status: DC

## 2017-10-02 MED ORDER — ENOXAPARIN SODIUM 80 MG/0.8ML ~~LOC~~ SOLN
1.0000 mg/kg | Freq: Once | SUBCUTANEOUS | Status: AC
  Administered 2017-10-02: 80 mg via SUBCUTANEOUS
  Filled 2017-10-02: qty 0.8

## 2017-10-02 MED ORDER — DIPHENHYDRAMINE HCL 50 MG/ML IJ SOLN
25.0000 mg | Freq: Once | INTRAMUSCULAR | Status: AC
  Administered 2017-10-02: 25 mg via INTRAVENOUS
  Filled 2017-10-02: qty 1

## 2017-10-02 MED ORDER — SODIUM CHLORIDE 0.9 % IV BOLUS (SEPSIS)
1000.0000 mL | Freq: Once | INTRAVENOUS | Status: AC
  Administered 2017-10-02: 1000 mL via INTRAVENOUS

## 2017-10-02 NOTE — ED Triage Notes (Signed)
Pt reports that  for the past 3 days she has been having a lot of pain to left leg pt states that she had pins placed in her foot on 09/27/17. Pt was placed on doxycycline post-op and has been taking Hydromorphone for pain.  pain   Pt also reports body aches, chills, nausea, dizziness, nasal congestion, and headache.

## 2017-10-02 NOTE — ED Provider Notes (Signed)
[place] DEPT Provider Note   CSN: [id number] Arrival date & time: 10/02/17  1734     History   Chief Complaint Chief Complaint  Patient presents with  . Leg Pain    HPI Stephanie Serrano is a 51 y.o. female.  HPI   Pt has PMHx of migraines, depression, anxiety, OCD, restless legs, vertigo, insomnia, HLD, and prediabetes.  Patient appears very anxious on exam.  Patient has pan positive review of systems.  Patient has a complaint in nearly every body system.  It is unclear to me given that breath of her complaints what to focus on today.    I asked her for the things that she is most concerned about.  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  She also says that the swelling in her left lower extremity worries her.  Patient had a recent small outpatient surgery in her left lower extremity and is in a cam boot.    Patient also reports that she fell when EMS is putting her into a wheelchair.  No evidence of external trauma.  However she reports her sacrum hurts a lot.  Patient has full range of motion of bilateral hips   Past Medical History:  Diagnosis Date  . Anxiety   . Asthma   . Depression   . Esophageal stricture   . Migraines   . OCD (obsessive compulsive disorder)   . Plantar fasciitis of right foot   . RLS (restless legs syndrome)   . Vertigo     Patient Active Problem List   Diagnosis Date Noted  . MDD (major depressive disorder), recurrent episode, moderate ([place]) 06/28/2017  . OCD (obsessive compulsive disorder) 03/26/2017  . Obstructive sleep apnea of adult 01/18/2017  . Insomnia 02/08/2016  . Heel spur 09/01/2015  . Family history of cystitis 08/31/2015  . Left foot pain 08/31/2015  . Prediabetes 08/23/2015  . Hyperlipidemia 08/23/2015  . Depression 03/21/2015  . Acute esophagitis 02/09/2015  . Gastroesophageal reflux  disease without esophagitis 08/27/2014  . Dysphagia, unspecified(787.20) 03/22/2014    Past Surgical History:  Procedure Laterality Date  . ESOPHAGOGASTRODUODENOSCOPY N/A 02/09/2015   Procedure: ESOPHAGOGASTRODUODENOSCOPY (EGD);  Surgeon: Inda Castle, MD;  Location: [facility];  Service: Endoscopy;  Laterality: N/A;  with dilation  . FOOT SURGERY    . NASAL SINUS SURGERY    . PLANTAR FASCIA RELEASE Right 08/03/2016   Procedure: PLANTAR FASCIA RELEASE WITH BONE SPUR EXCISION;  Surgeon: Dorna Leitz, MD;  Location: [facility];  Service: Orthopedics;  Laterality: Right;    OB History    No data available       Home Medications    Prior to Admission medications   Medication Sig Start Date End Date Taking? Authorizing Provider  albuterol (PROVENTIL HFA;VENTOLIN HFA) 108 (90 Base) MCG/ACT inhaler Inhale 2 puffs into the lungs every 6 (six) hours as needed for wheezing or shortness of breath. 07/02/17   Lindell Spar I, NP  CarBAMazepine (TEGRETOL PO) Take by mouth.    [provider]  clonazePAM (KLONOPIN) 0.5 MG tablet Take 0.5 mg by mouth 2 (two) times daily. 06/21/17   [provider]  doxycycline (VIBRA-TABS) 100 MG tablet Take 1 tablet (100 mg total) by mouth 2 (two) times daily. 09/25/17   Hyatt, Max T, DPM  FLUoxetine (PROZAC) 20 MG capsule Take 3 capsules (  60 mg total) by mouth daily. For depression 07/03/17   Lindell Spar I, NP  HYDROmorphone (DILAUDID) 4 MG tablet Take 1 tablet (4 mg total) by mouth every 4 (four) hours as needed for severe pain. 09/25/17   Hyatt, Max T, DPM  hydrOXYzine (ATARAX/VISTARIL) 50 MG tablet Take 1 tablet (50 mg total) by mouth every 6 (six) hours as needed for anxiety. 07/02/17   Encarnacion Slates, NP  LINZESS 72 MCG capsule TAKE 1 CAPSULE BY MOUTH DAILY BEFORE BREAKFAST 09/18/17   Scot Jun, FNP  ondansetron (ZOFRAN) 4 MG tablet Take 1 tablet (4 mg total) by mouth every 6 (six) hours as needed for nausea or vomiting.  07/02/17   Encarnacion Slates, NP  promethazine (PHENERGAN) 25 MG tablet Take 1 tablet (25 mg total) by mouth every 8 (eight) hours as needed. 09/25/17   Hyatt, Max T, DPM  traZODone (DESYREL) 50 MG tablet Take 1 tablet (50 mg total) by mouth at bedtime as needed for sleep. 07/02/17   Encarnacion Slates, NP    Family History Family History  Problem Relation Age of Onset  . Pancreatic cancer Father   . Diabetes Mother   . Heart disease Mother   . Anxiety disorder Brother   . Diabetes Paternal Grandmother   . Esophageal cancer Neg Hx   . Stomach cancer Neg Hx     Social History Social History  Substance Use Topics  . Smoking status: Never Smoker  . Smokeless tobacco: Never Used  . Alcohol use Yes     Comment: socially     Allergies   Azithromycin; Clindamycin/lincomycin; Penicillins; Sulfa antibiotics; and Vicodin [hydrocodone-acetaminophen]   Review of Systems Review of Systems  Constitutional: Positive for activity change, appetite change, fatigue and fever.  HENT: Positive for congestion, facial swelling, postnasal drip, rhinorrhea, sinus pressure and sore throat.   Eyes: Positive for photophobia, pain and visual disturbance.  Respiratory: Positive for cough. Negative for shortness of breath.   Cardiovascular: Positive for palpitations. Negative for chest pain.  Gastrointestinal: Positive for abdominal pain and constipation.  Genitourinary: Negative for difficulty urinating and dysuria.  Musculoskeletal: Positive for back pain.  Neurological: Positive for dizziness, light-headedness and headaches.  Psychiatric/Behavioral: The patient is nervous/anxious.      Physical Exam Updated Vital Signs BP 126/85 (BP Location: Right Arm)   Pulse 100   Temp 98.9 F (37.2 C) (Oral)   Resp 18   Ht 5\' 2"  (1.575 m)   Wt 79.4 kg (175 lb)   SpO2 93%   BMI 32.01 kg/m   Physical Exam  Constitutional: She is oriented to person, place, and time. She appears well-developed and  well-nourished.  HENT:  Head: Normocephalic and atraumatic.  Right Ear: External ear normal.  Left Ear: External ear normal.  Eyes: Right eye exhibits no discharge. Left eye exhibits no discharge.  Cardiovascular: Normal rate and regular rhythm.   No murmur heard. Pulmonary/Chest: Effort normal and breath sounds normal. No respiratory distress. She has no wheezes.  Abdominal: Soft. She exhibits no distension. There is no tenderness.  Neurological: She is oriented to person, place, and time. No cranial nerve deficit. Coordination normal.  Skin: Skin is warm and dry. She is not diaphoretic.  Psychiatric:  Very anxious  Nursing note and vitals reviewed.    ED Treatments / Results  Labs (all labs ordered are listed, but only abnormal results are displayed) Labs Reviewed  COMPREHENSIVE METABOLIC PANEL  CBC WITH DIFFERENTIAL/PLATELET    EKG  EKG Interpretation None       Radiology No results found.  Procedures Procedures (including critical care time)  Medications Ordered in ED Medications  sodium chloride 0.9 % bolus 1,000 mL (not administered)  ketorolac (TORADOL) 30 MG/ML injection 30 mg (not administered)  metoCLOPramide (REGLAN) injection 5 mg (not administered)  diphenhydrAMINE (BENADRYL) injection 25 mg (not administered)     Initial Impression / Assessment and Plan / ED Course  I have reviewed the triage vital signs and the nursing notes.  Pertinent labs & imaging results that were available during my care of the patient were reviewed by me and considered in my medical decision making (see chart for details).     Pt has PMHx of migraines, depression, anxiety, OCD, restless legs, vertigo, insomnia, HLD, and prediabetes.  Patient appears very anxious on exam.  Patient has pan positive review of systems.  Patient has a complaint in nearly every body system.  It is unclear to me given that breath of her complaints what to focus on today.    I asked her for the  things that she is most concerned about.  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  She also says that the swelling in her left lower extremity worries her.  Patient had a recent small outpatient surgery in her left lower extremity and is in a cam boot.    Patient also reports that she fell when EMS is putting her into a wheelchair.  No evidence of external trauma.  However she reports her sacrum hurts a lot.  Patient has full range of motion of bilateral hips  9:36 PM We will get CT of her head given patient's concerns.  Additionally will get some baseline lab work.  We will treat her with a migraine cocktail because she reports she has history of migraines.  Patient is concerned about the swelling in her left lower extremity.  Although it appears the same size to the right lower extremity to me, patient has had recent surgery.  We will give her a shot of Lovenox and have her follow-up in the morning to get ultrasound [facility].  Patient reports family member coming to be with her, will check base with family member to make sure that this is patient's baseline anxiety level.  10:00 PM Patient now complaining of constipation.  Patient has been on hydromorphone since that surgery 4 days ago.  We recommend that she stop taking the medication as she likely no longer needs it.  Recommended that she take MiraLAX.  She reports "I have tried everything and nothing works" "please stick a tube in me and take it out".  We will offer her an enema here. Abdomen is soft on exam.   11:49 PM Pt had small fracture of coccyx, will encourage ibuprofen, tyelnol.  Pt wants to take her fleet enema home. We gave her a shot of lovenox and she will [place] in the morning.   Otherwise labs and vitals are reassuring. No SOB. Or CP.   Final Clinical Impressions(s) / ED Diagnoses   Final diagnoses:  None    New  Prescriptions New Prescriptions   No medications on file     Macarthur Critchley, MD 10/02/17 2351

## 2017-10-02 NOTE — ED Notes (Signed)
Patient transported to X-ray 

## 2017-10-02 NOTE — Discharge Instructions (Signed)
We are unsure if you have a DVT. You need to go to [facility] in the morning for the study.   You also have what appears has to be a small fracture of your coccyx.  Please use Tylenol or ibuprofen to help with discomfort.  You can also use heating pads or ice.

## 2017-10-02 NOTE — Telephone Encounter (Signed)
Pt states she does not feel well, has dizziness, headache, not had a bowel movement since after beginning the hydromorphone, has urine retention, and nausea. I told pt to stop the dilaudid if she felt she could and start stool softeners, that some of the bowel and bladder problem may be from the narcotics and she may have a bladder infection from decrease fluid intake. I asked pt how long she had felt this way and she said 3 days. I told pt she may have the flu as well as a problem with the narcotic side effect. Pt states she has tried not taking the pain medication but her leg is cramping so bad and has a knot to the back of her left leg. I told pt to get to the ED and she states she doesn't know how to call 911, if I would and I asked if she had an adult there to drive her and she said her dtr, but she didn't drive, I asked if her husband could and she said she was divorced. Pt's dtr asked why I asked if someone else could drive and I told her I had pt's call me mad due to the cost, when I had called the EMT. I had thought if transport was there it may be faster than waiting the EMT. I called the 911, I informed Elmyra Ricks of pt's condition DOS 09/27/2017, she stated she had EMT and fire services on the way. I informed Melissa - [facility] of pt's condition and that she was to be transported by EMT.

## 2017-10-02 NOTE — ED Triage Notes (Signed)
Pt brought in by EMS from who states pt has been having increased left leg pain, with some swelling x past 3 day. Pt reports that she had foot surgery last week , and has not followed up with post-op care     V/s 148/86, HR 100 , RR 18, CBG 144

## 2017-10-02 NOTE — ED Notes (Signed)
Pt requested to take enema home with her and do it there

## 2017-10-03 ENCOUNTER — Telehealth: Payer: Self-pay

## 2017-10-03 ENCOUNTER — Ambulatory Visit (HOSPITAL_COMMUNITY)
Admission: RE | Admit: 2017-10-03 | Discharge: 2017-10-03 | Disposition: A | Discharge status: Home / Self Care | Payer: Medicaid Other | Source: Ambulatory Visit | Attending: Physician Assistant | Admitting: Physician Assistant

## 2017-10-03 ENCOUNTER — Ambulatory Visit (HOSPITAL_COMMUNITY): Admission: RE | Admit: 2017-10-03 | Payer: Medicaid Other | Source: Ambulatory Visit

## 2017-10-03 ENCOUNTER — Encounter: Payer: Medicaid Other | Admitting: Podiatry

## 2017-10-03 DIAGNOSIS — M7989 Other specified soft tissue disorders: Secondary | ICD-10-CM | POA: Diagnosis not present

## 2017-10-03 NOTE — Telephone Encounter (Signed)
LVM informing patient to call the office at her earliest convienence, noted ED visit yesterday, as soon as she can she needs to be seen at our office for her first post op visit

## 2017-10-03 NOTE — Progress Notes (Signed)
Left lower extremity venous duplex has been completed. Preliminary results can be found in chart review -> CV Proc  10/03/17 5:20 PM Carlos Levering RVT

## 2017-10-08 ENCOUNTER — Ambulatory Visit (INDEPENDENT_AMBULATORY_CARE_PROVIDER_SITE_OTHER): Payer: Medicaid Other

## 2017-10-08 ENCOUNTER — Ambulatory Visit (INDEPENDENT_AMBULATORY_CARE_PROVIDER_SITE_OTHER): Payer: Medicaid Other | Admitting: Podiatry

## 2017-10-08 DIAGNOSIS — M2041 Other hammer toe(s) (acquired), right foot: Secondary | ICD-10-CM

## 2017-10-08 DIAGNOSIS — M205X2 Other deformities of toe(s) (acquired), left foot: Secondary | ICD-10-CM

## 2017-10-08 DIAGNOSIS — M2042 Other hammer toe(s) (acquired), left foot: Secondary | ICD-10-CM

## 2017-10-08 DIAGNOSIS — M216X2 Other acquired deformities of left foot: Secondary | ICD-10-CM

## 2017-10-08 MED ORDER — HYDROMORPHONE HCL 4 MG PO TABS: 4.0000 mg | ORAL_TABLET | ORAL | 0 refills | Status: DC | PRN

## 2017-10-08 NOTE — Progress Notes (Unsigned)
This encounter was created in error - please disregard.

## 2017-10-08 NOTE — Telephone Encounter (Signed)
I spoke with pt she states she went to the ED at [facility], received IV Benadryl and venous doppler was negative for DVT, then she left. Pt states she was feeling bad from the last antibiotic doxycycline, and is currently stopped up due to the mucus membranes being swollen from the reaction to the doxycycline.

## 2017-10-08 NOTE — Telephone Encounter (Signed)
ok 

## 2017-10-09 ENCOUNTER — Ambulatory Visit (INDEPENDENT_AMBULATORY_CARE_PROVIDER_SITE_OTHER): Payer: Medicaid Other | Admitting: Family Medicine

## 2017-10-09 ENCOUNTER — Encounter: Payer: Self-pay | Admitting: Family Medicine

## 2017-10-09 ENCOUNTER — Ambulatory Visit: Payer: Medicaid Other

## 2017-10-09 VITALS — BP 118/74 | HR 90 | Temp 98.2°F | Resp 14 | Ht 62.0 in | Wt 182.0 lb

## 2017-10-09 DIAGNOSIS — S322XXA Fracture of coccyx, initial encounter for closed fracture: Secondary | ICD-10-CM

## 2017-10-09 DIAGNOSIS — S3210XA Unspecified fracture of sacrum, initial encounter for closed fracture: Secondary | ICD-10-CM

## 2017-10-09 NOTE — Progress Notes (Signed)
She presents today with her daughter. Date of surgery 08/27/2017 status post [facility] bunion repair left foot. Hammertoe repair #2 and #3. She states that she is very happy with how her toes look and she states that she is feeling much better.  Objective: Vital signs are stable she is alert and oriented 3. Pulses are palpable. Good range of motion  Assessment: Well-healing surgical foot.  Plan: Follow up with her in a couple of weeks if her sutures have not been removed completely removed all her sutures and demonstrated how she is to dress the toes.

## 2017-10-09 NOTE — Telephone Encounter (Signed)
Pt states her dressing was changed yesterday and now the gauze between the toes is rubbing when she walk. I transferred pt to nurse's schedule for dressing change.

## 2017-10-09 NOTE — Patient Instructions (Signed)
Tailbone Injury The tailbone is the small bone at the lower end of the backbone (spine). You may have stretched tissues, bruises, or a broken bone (fracture). These injuries can be painful. Most tailbone injuries get better on their own in 4-6 weeks. Follow these instructions at home:  Take medicines only as told by your doctor.  If told, apply ice to the injured area. ? Put ice in a plastic bag. ? Place a towel between your skin and the bag. ? Leave the ice on for 20 minutes, 2-3 times per day. Do this for the first 1-2 days.  Sit on a large, rubber or inflated ring or cushion to lessen pain. Lean forward when you sit to help lessen pain.  Avoid sitting in one place for a long time.  Increase your activity as the pain allows.  Do exercises as told by your doctor or physical therapist.  If it is painful to poop, take medicine to help you poop (stool softeners) as told by your doctor.  Eat foods that have plenty of fiber.  Keep all follow-up visits as told by your doctor. This is important. Contact a doctor if:  Your pain gets worse.  Pooping causes you pain.  You cannot poop (constipation).  You are leaking pee (urinary incontinence).  You have a fever. This information is not intended to replace advice given to you by your health care provider. Make sure you discuss any questions you have with your health care provider. Document Released: 12/29/2010 Document Revised: 07/26/2016 Document Reviewed: 11/22/2014 Elsevier Interactive Patient Education  2018 Elsevier Inc.  

## 2017-10-09 NOTE — Progress Notes (Signed)
Patient ID: Stephanie Serrano, female    DOB: October 19, 1966, 51 y.o.   MRN: [id number]  PCP: Scot Jun, FNP  Chief Complaint  Patient presents with  . Fall    tailbone fracture     Subjective:  HPI Stephanie Serrano is a 51 y.o. female presents for evaluation of a tail bone fracture. [name] was transported via EMS to [facility] ED for left leg swelling and dizziness. She reports that after being transported from the EMS vehicle she was placed in a chair in small area without a call bell. While ambulating to walk to the bathroom, she slipped falling onto her tailbone.  Imaging of the sacrum/coccyx impression was significant for: Cortical irregularity along the distal sacrum anteriorly which may represent an undisplaced fracture. She reports that the pain is more pronounced with sitting and she is requesting a gel cushion and or heating pad to improve comfort. She is not experiencing difficulty ambulating.  Social History   Social History  . Marital status: Divorced    Spouse name: N/A  . Number of children: 2  . Years of education: N/A   Occupational History  . [employer]   Social History Main Topics  . Smoking status: Never Smoker  . Smokeless tobacco: Never Used  . Alcohol use Yes     Comment: socially  . Drug use: No  . Sexual activity: No   Other Topics Concern  . Not on file   Social History Narrative  . No narrative on file    Family History  Problem Relation Age of Onset  . Pancreatic cancer Father   . Diabetes Mother   . Heart disease Mother   . Anxiety disorder Brother   . Diabetes Paternal Grandmother   . Esophageal cancer Neg Hx   . Stomach cancer Neg Hx    Review of Systems See HPI  Patient Active Problem List   Diagnosis Date Noted  . MDD (major depressive disorder), recurrent episode, moderate ([place]) 06/28/2017  . OCD (obsessive compulsive disorder) 03/26/2017  . Obstructive sleep apnea of adult 01/18/2017  . Insomnia 02/08/2016  . Heel  spur 09/01/2015  . Family history of cystitis 08/31/2015  . Left foot pain 08/31/2015  . Prediabetes 08/23/2015  . Hyperlipidemia 08/23/2015  . Depression 03/21/2015  . Acute esophagitis 02/09/2015  . Gastroesophageal reflux disease without esophagitis 08/27/2014  . Dysphagia, unspecified(787.20) 03/22/2014   Prior to Admission medications   Medication Sig Start Date End Date Taking? Authorizing Provider  albuterol (PROVENTIL HFA;VENTOLIN HFA) 108 (90 Base) MCG/ACT inhaler Inhale 2 puffs into the lungs every 6 (six) hours as needed for wheezing or shortness of breath. 07/02/17  Yes Nwoko, Herbert Pun I, NP  clonazePAM (KLONOPIN) 0.5 MG tablet Take 0.5 mg by mouth 2 (two) times daily. 06/21/17  Yes [provider]  FLUoxetine (PROZAC) 20 MG capsule Take 3 capsules (60 mg total) by mouth daily. For depression 07/03/17  Yes Nwoko, Herbert Pun I, NP  HYDROmorphone (DILAUDID) 4 MG tablet Take 1 tablet (4 mg total) by mouth every 4 (four) hours as needed for severe pain. 10/08/17  Yes Hyatt, Max T, DPM  hydrOXYzine (ATARAX/VISTARIL) 50 MG tablet Take 1 tablet (50 mg total) by mouth every 6 (six) hours as needed for anxiety. 07/02/17  Yes Encarnacion Slates, NP  LINZESS 72 MCG capsule TAKE 1 CAPSULE BY MOUTH DAILY BEFORE BREAKFAST Patient taking differently: TAKE 72MCG  BY MOUTH DAILY BEFORE BREAKFAST 09/18/17  Yes Scot Jun, FNP  ondansetron (ZOFRAN) 4 MG tablet Take 1 tablet (4 mg total) by mouth every 6 (six) hours as needed for nausea or vomiting. 07/02/17  Yes Nwoko, Herbert Pun I, NP  polyethylene glycol (MIRALAX) packet Take 17 g by mouth daily. 10/02/17  Yes Mackuen, Courteney Lyn, MD  promethazine (PHENERGAN) 25 MG tablet Take 1 tablet (25 mg total) by mouth every 8 (eight) hours as needed. 09/25/17  Yes Hyatt, Max T, DPM  traZODone (DESYREL) 50 MG tablet Take 1 tablet (50 mg total) by mouth at bedtime as needed for sleep. 07/02/17  Yes Lindell Spar I, NP  doxycycline (VIBRA-TABS) 100 MG tablet Take 1  tablet (100 mg total) by mouth 2 (two) times daily. Patient not taking: Reported on 10/09/2017 09/25/17   Garrel Ridgel, DPM    Past Medical, Surgical Family and Social History reviewed and updated.    Objective:   Today's Vitals   10/09/17 1050  BP: 118/74  Pulse: 90  Resp: 14  Temp: 98.2 F (36.8 C)  TempSrc: Oral  SpO2: 99%  Weight: 182 lb (82.6 kg)  Height: 5\' 2"  (1.575 m)    Wt Readings from Last 3 Encounters:  10/09/17 182 lb (82.6 kg)  10/02/17 175 lb (79.4 kg)  09/05/17 178 lb (80.7 kg)   Physical Exam  Constitutional: She is oriented to person, place, and time. She appears well-developed and well-nourished.  HENT:  Head: Normocephalic and atraumatic.  Eyes: Conjunctivae and EOM are normal. Pupils are equal, round, and reactive to light.  Neck: Normal range of motion. Neck supple.  Cardiovascular: Normal rate, regular rhythm, normal heart sounds and intact distal pulses.  Pulmonary/Chest: Effort normal and breath sounds normal.  Musculoskeletal: Normal range of motion. She exhibits tenderness.  Neurological: She is alert and oriented to person, place, and time.  Skin: Skin is warm and dry.  Psychiatric: She has a normal mood and affect. Her behavior is normal. Judgment and thought content normal.   Assessment & Plan:  1. Closed fracture of sacrum and coccyx, initial encounter [facility]) -Prescription given a prescription  waffle cushion  And for heating pad.  Patient was advised that Medicaid may not cover the cost of these items however I agreed to write the prescriptions. Encouraged to alternate warm and cool compresses to decreased inflammation.   Return for care if symptoms worsens or does not improve.  Carroll Sage. Kenton Kingfisher, MSN, FNP-C [facility]  [address] [place], [place] [address] [phone]

## 2017-10-10 ENCOUNTER — Ambulatory Visit (INDEPENDENT_AMBULATORY_CARE_PROVIDER_SITE_OTHER): Payer: Medicaid Other | Admitting: Podiatry

## 2017-10-10 ENCOUNTER — Other Ambulatory Visit: Payer: Medicaid Other

## 2017-10-10 DIAGNOSIS — M205X2 Other deformities of toe(s) (acquired), left foot: Secondary | ICD-10-CM

## 2017-10-10 DIAGNOSIS — M2041 Other hammer toe(s) (acquired), right foot: Secondary | ICD-10-CM

## 2017-10-10 DIAGNOSIS — M2042 Other hammer toe(s) (acquired), left foot: Secondary | ICD-10-CM

## 2017-10-14 ENCOUNTER — Ambulatory Visit: Payer: Medicaid Other | Admitting: Allergy and Immunology

## 2017-10-17 ENCOUNTER — Ambulatory Visit (INDEPENDENT_AMBULATORY_CARE_PROVIDER_SITE_OTHER): Payer: Medicaid Other | Admitting: Podiatry

## 2017-10-17 ENCOUNTER — Encounter: Payer: Self-pay | Admitting: Podiatry

## 2017-10-17 DIAGNOSIS — M216X2 Other acquired deformities of left foot: Secondary | ICD-10-CM

## 2017-10-17 DIAGNOSIS — M2042 Other hammer toe(s) (acquired), left foot: Secondary | ICD-10-CM

## 2017-10-17 DIAGNOSIS — M2041 Other hammer toe(s) (acquired), right foot: Secondary | ICD-10-CM

## 2017-10-17 DIAGNOSIS — M205X2 Other deformities of toe(s) (acquired), left foot: Secondary | ICD-10-CM

## 2017-10-17 NOTE — Progress Notes (Signed)
She presents today follow-up of her [place] bunionectomy hammertoe repair #2 #3. She states that her toes are draining something she says it looks like her blisters present. He presents with her daughter once again today.  Objective: Vital signs are stable she is alert and oriented 3 the surgical foot looks good that she does have some blisters overlying the toes or incisions. There do not appear to be infected. I removed the remainder sutures today because margins are really well coapted I placed dry sterile compressive dressing around the toes.  Assessment: On surgical foot I will allow her start getting this wet and I will her to continue use of the [place] shoe for the next couple of weeks. I will follow-up with her in 2 weeks for release to regular shoe gear.

## 2017-10-20 ENCOUNTER — Other Ambulatory Visit: Payer: Self-pay | Admitting: Family Medicine

## 2017-10-20 DIAGNOSIS — K5900 Constipation, unspecified: Secondary | ICD-10-CM

## 2017-10-21 NOTE — Progress Notes (Signed)
Patient presents s/p foot surgery wanting her bandages changed. Soiled dressing removed, noted well healing surgical areas. No s/s of infection. Re-wrapped with sterile gauze, reappointed at next schedule visit with Dr Milinda Pointer

## 2017-10-22 ENCOUNTER — Other Ambulatory Visit: Payer: Medicaid Other

## 2017-11-05 ENCOUNTER — Other Ambulatory Visit: Payer: Medicaid Other

## 2017-11-14 ENCOUNTER — Ambulatory Visit (INDEPENDENT_AMBULATORY_CARE_PROVIDER_SITE_OTHER): Payer: Medicaid Other | Admitting: Podiatry

## 2017-11-14 ENCOUNTER — Ambulatory Visit (INDEPENDENT_AMBULATORY_CARE_PROVIDER_SITE_OTHER): Payer: Medicaid Other

## 2017-11-14 DIAGNOSIS — M205X2 Other deformities of toe(s) (acquired), left foot: Secondary | ICD-10-CM | POA: Diagnosis not present

## 2017-11-14 DIAGNOSIS — M2041 Other hammer toe(s) (acquired), right foot: Secondary | ICD-10-CM

## 2017-11-14 DIAGNOSIS — M216X2 Other acquired deformities of left foot: Secondary | ICD-10-CM | POA: Diagnosis not present

## 2017-11-14 DIAGNOSIS — M2042 Other hammer toe(s) (acquired), left foot: Secondary | ICD-10-CM | POA: Diagnosis not present

## 2017-11-14 NOTE — Progress Notes (Signed)
She presents today for follow-up of her hammertoe repair fourth and fifth digits as well as third digit of the left foot.  Also fifth metatarsal osteotomy.  She states that she is doing fine as far as her surgery goes however her daughter 51 years old passed away less than 1 week ago of unknown cause.  Objective: Vital signs are stable she is alert and oriented x3 very sad and crying today.  Her toes appear to be in good rectus position #3 #4 #5 radiographs taken today demonstrate a well-healing fifth metatarsal osteotomy.  Assessment: Well-healing surgical foot.  Plan: We will allow her back into regular shoe gear and she will notify me with questions or concerns otherwise I will follow-up with her in 1 month.  Remember her daughter passed away 76 years old just last week.

## 2017-11-18 ENCOUNTER — Ambulatory Visit: Payer: Medicaid Other | Admitting: Allergy and Immunology

## 2017-11-21 ENCOUNTER — Ambulatory Visit: Payer: Medicaid Other | Admitting: Allergy and Immunology

## 2017-11-25 ENCOUNTER — Ambulatory Visit: Payer: Medicaid Other | Admitting: Family Medicine

## 2017-12-26 ENCOUNTER — Ambulatory Visit (INDEPENDENT_AMBULATORY_CARE_PROVIDER_SITE_OTHER): Payer: Medicaid Other

## 2017-12-26 ENCOUNTER — Ambulatory Visit (INDEPENDENT_AMBULATORY_CARE_PROVIDER_SITE_OTHER): Payer: Medicaid Other | Admitting: Podiatry

## 2017-12-26 ENCOUNTER — Encounter: Payer: Self-pay | Admitting: Podiatry

## 2017-12-26 DIAGNOSIS — M2042 Other hammer toe(s) (acquired), left foot: Secondary | ICD-10-CM | POA: Diagnosis not present

## 2017-12-26 DIAGNOSIS — M216X2 Other acquired deformities of left foot: Secondary | ICD-10-CM

## 2017-12-26 DIAGNOSIS — M205X2 Other deformities of toe(s) (acquired), left foot: Secondary | ICD-10-CM

## 2017-12-26 DIAGNOSIS — M2041 Other hammer toe(s) (acquired), right foot: Secondary | ICD-10-CM | POA: Diagnosis not present

## 2017-12-26 NOTE — Progress Notes (Signed)
She presents today for follow-up of her hammertoe repair #3 #4 #5 of the left foot with fifth metatarsal osteotomy left foot.  Date of surgery September 27, 2017.  She denies fever chills nausea vomiting muscle aches and pains.  States that she is wearing regular shoes again and has very little pain.  Objective: Vital signs are stable she is alert and oriented x3 no erythema edema cellulitis drainage or odor toes are gone on to heal uneventfully.  She has no pain on palpation of the fifth metatarsal of the left foot which appears to be a little thicker than that of the fourth.  Radiographs do demonstrate some soft tissue edema around the fifth metatarsal but appears to be healing very nicely and in good position.  Assessment well-healing surgical foot.  Plan: Follow-up with me on an as-needed basis.  Should she have questions or concerns or any discomfort she will notify us immediately.

## 2018-01-10 NOTE — Progress Notes (Signed)
DOS 09/27/17 5th metatarsal osteotomy w/ screw Lt Derotational arthoplasty 5th toe LT Toe Repair 3,4, Lt

## 2018-02-20 ENCOUNTER — Telehealth: Payer: Self-pay | Admitting: *Deleted

## 2018-02-20 NOTE — Telephone Encounter (Signed)
Pt states the blister on the surgical site, Dr. Milinda Pointer stated was fungal not infection, and it has spread to her hands. I offered pt an appt and transferred to schedulers.

## 2018-02-25 ENCOUNTER — Ambulatory Visit: Payer: Medicaid Other | Admitting: Podiatry

## 2018-02-25 ENCOUNTER — Encounter: Payer: Self-pay | Admitting: Podiatry

## 2018-02-25 DIAGNOSIS — B353 Tinea pedis: Secondary | ICD-10-CM

## 2018-02-25 MED ORDER — TERBINAFINE HCL 250 MG PO TABS: 250.0000 mg | ORAL_TABLET | Freq: Every day | ORAL | 0 refills | Status: DC

## 2018-02-25 NOTE — Progress Notes (Signed)
She presents today chief complaint of rash her feet.  She states it initially began with her feet and this spread to her hands.  Objective: Areas of vascular flow in the fingers and toes.  There are a few areas on the dorsal aspect of the foot as well as the lateral aspect appear to be consistent with tinea pedis.  Assessment: Possible tinea pedis.  Plan: For her to make an appointment with her dermatologist.  I also started her on Lamisil 250 mg tablets 1 daily.

## 2018-03-04 ENCOUNTER — Ambulatory Visit: Payer: Medicaid Other | Admitting: Podiatry

## 2018-03-11 ENCOUNTER — Ambulatory Visit: Payer: Medicaid Other | Admitting: Podiatry

## 2018-03-12 ENCOUNTER — Encounter: Payer: Self-pay | Admitting: Gastroenterology

## 2018-03-12 ENCOUNTER — Encounter: Payer: Self-pay | Admitting: Family Medicine

## 2018-03-12 ENCOUNTER — Ambulatory Visit (INDEPENDENT_AMBULATORY_CARE_PROVIDER_SITE_OTHER): Payer: Medicaid Other | Admitting: Family Medicine

## 2018-03-12 VITALS — BP 128/78 | HR 100 | Temp 98.0°F | Ht 62.0 in | Wt 200.3 lb

## 2018-03-12 DIAGNOSIS — Z23 Encounter for immunization: Secondary | ICD-10-CM | POA: Diagnosis not present

## 2018-03-12 DIAGNOSIS — Z634 Disappearance and death of family member: Secondary | ICD-10-CM | POA: Diagnosis not present

## 2018-03-12 DIAGNOSIS — Z1211 Encounter for screening for malignant neoplasm of colon: Secondary | ICD-10-CM | POA: Diagnosis not present

## 2018-03-12 DIAGNOSIS — L2089 Other atopic dermatitis: Secondary | ICD-10-CM | POA: Diagnosis not present

## 2018-03-12 DIAGNOSIS — F4321 Adjustment disorder with depressed mood: Secondary | ICD-10-CM | POA: Diagnosis not present

## 2018-03-12 DIAGNOSIS — Z1389 Encounter for screening for other disorder: Secondary | ICD-10-CM

## 2018-03-12 LAB — POCT URINALYSIS DIP (DEVICE)
Bilirubin Urine: NEGATIVE
GLUCOSE, UA: NEGATIVE mg/dL
Hgb urine dipstick: NEGATIVE
LEUKOCYTES UA: NEGATIVE
NITRITE: NEGATIVE
PROTEIN: NEGATIVE mg/dL
Specific Gravity, Urine: >=1.03 (ref 1.005–1.030)
UROBILINOGEN UA: 0.2 mg/dL (ref 0.0–1.0)
pH: 5.5 (ref 5.0–8.0)

## 2018-03-12 MED ORDER — HYDROXYZINE HCL 50 MG PO TABS: 50.0000 mg | ORAL_TABLET | Freq: Four times a day (QID) | ORAL | 0 refills | Status: DC | PRN

## 2018-03-12 NOTE — Progress Notes (Signed)
Patient ID: Stephanie Serrano, female    DOB: 1966/04/30, 52 y.o.   MRN: [id number]  PCP: Scot Jun, FNP  Chief Complaint  Patient presents with  . Follow-up    6 month wellness    Subjective:  HPI Stephanie Serrano is a 52 y.o. female with atopic dermatitis, OCD, Depression, GERD, Obstructive Sleep Apnea, presents for a routine wellness follow-up. Since Stephanie Serrano his last office visit she suffered the loss of her 97 year old daughter resided in the home with her. She is currently in counseling every week and mixed with the psychotherapist every 4 weeks.  She reports that she is coping well.  She has a good support system with family and her ex-husband.  She denies any thoughts of suicide, homicidal ideations, or auditory hallucinations. She reports that she is eating well although notes that she is gaining weight and feels that she is eating more to cope with her grief. [facility] requests to be referred for a colon cancer screening as she is over and has not had a prior screening. She has no known knowledge of family history significant for Colon Cancer. She denies bloody or tarry stools. Last PAP was normal 08/2015 and pathology was normal. She is due for mammogram and is interested in having this screening completed. She has not other complaints today.  Social History   Socioeconomic History  . Marital status: Divorced    Spouse name: Not on file  . Number of children: 2  . Years of education: Not on file  . Highest education level: Not on file  Occupational History  . Occupation: Special educational needs teacher: [employer]  . Financial resource strain: Not on file  . Food insecurity:    Worry: Not on file    Inability: Not on file  . Transportation needs:    Medical: Not on file    Non-medical: Not on file  Tobacco Use  . Smoking status: Never Smoker  . Smokeless tobacco: Never Used  Substance and Sexual Activity  . Alcohol use: Yes    Comment: socially  . Drug use: No  .  Sexual activity: Never  Lifestyle  . Physical activity:    Days per week: Not on file    Minutes per session: Not on file  . Stress: Not on file  Relationships  . Social connections:    Talks on phone: Not on file    Gets together: Not on file    Attends religious service: Not on file    Active member of club or organization: Not on file    Attends meetings of clubs or organizations: Not on file    Relationship status: Not on file  . Intimate partner violence:    Fear of current or ex partner: Not on file    Emotionally abused: Not on file    Physically abused: Not on file    Forced sexual activity: Not on file  Other Topics Concern  . Not on file  Social History Narrative  . Not on file    Family History  Problem Relation Age of Onset  . Pancreatic cancer Father   . Diabetes Mother   . Heart disease Mother   . Anxiety disorder Brother   . Diabetes Paternal Grandmother   . Esophageal cancer Neg Hx   . Stomach cancer Neg Hx      Review of Systems  Constitutional: Negative.   Respiratory: Negative.   Cardiovascular: Negative.   Gastrointestinal: Negative.  Genitourinary: Negative.   Musculoskeletal: Negative.   Skin: Positive for rash.       Chronic dermatitis   Psychiatric/Behavioral: Positive for dysphoric mood. Negative for self-injury, sleep disturbance and suicidal ideas. The patient is not nervous/anxious and is not hyperactive.    Patient Active Problem List   Diagnosis Date Noted  . MDD (major depressive disorder), recurrent episode, moderate ([place]) 06/28/2017  . OCD (obsessive compulsive disorder) 03/26/2017  . Obstructive sleep apnea of adult 01/18/2017  . Insomnia 02/08/2016  . Heel spur 09/01/2015  . Family history of cystitis 08/31/2015  . Left foot pain 08/31/2015  . Prediabetes 08/23/2015  . Hyperlipidemia 08/23/2015  . Depression 03/21/2015  . Acute esophagitis 02/09/2015  . Gastroesophageal reflux disease without esophagitis 08/27/2014  .  Dysphagia, unspecified(787.20) 03/22/2014    Allergies  Allergen Reactions  . Azithromycin   . Clindamycin/Lincomycin   . Doxycycline     Swelling of mucous membrane, body pain  . Penicillins     Has patient had a PCN reaction causing immediate rash, facial/tongue/throat swelling, SOB or lightheadedness with hypotension: yes Has patient had a PCN reaction causing severe rash involving mucus membranes or skin necrosis: no Has patient had a PCN reaction that required hospitalization: no Has patient had a PCN reaction occurring within the last 10 years: no If all of the above answers are "NO", then may proceed with Cephalosporin use.   . Sulfa Antibiotics   . Vicodin [Hydrocodone-Acetaminophen] Nausea Only    Prior to Admission medications   Medication Sig Start Date End Date Taking? Authorizing Provider  albuterol (PROVENTIL HFA;VENTOLIN HFA) 108 (90 Base) MCG/ACT inhaler Inhale 2 puffs into the lungs every 6 (six) hours as needed for wheezing or shortness of breath. 07/02/17  Yes Nwoko, Herbert Pun I, NP  clonazePAM (KLONOPIN) 0.5 MG tablet Take 0.5 mg by mouth 2 (two) times daily. 06/21/17  Yes [provider]  FLUoxetine (PROZAC) 20 MG capsule Take 3 capsules (60 mg total) by mouth daily. For depression 07/03/17  Yes Encarnacion Slates, NP  LINZESS 72 MCG capsule TAKE 1 CAPSULE BY MOUTH DAILY BEFORE BREAKFAST 10/21/17  Yes Scot Jun, FNP  polyethylene glycol [facility]) packet Take 17 g by mouth daily. 10/02/17  Yes Mackuen, Courteney Lyn, MD  promethazine (PHENERGAN) 25 MG tablet Take 1 tablet (25 mg total) by mouth every 8 (eight) hours as needed. 09/25/17  Yes Hyatt, Max T, DPM  traZODone (DESYREL) 50 MG tablet Take 1 tablet (50 mg total) by mouth at bedtime as needed for sleep. 07/02/17  Yes Lindell Spar I, NP  hydrOXYzine (ATARAX/VISTARIL) 50 MG tablet Take 1 tablet (50 mg total) by mouth every 6 (six) hours as needed for anxiety. Patient not taking: Reported on 03/12/2018 07/02/17    Lindell Spar I, NP  ondansetron (ZOFRAN) 4 MG tablet Take 1 tablet (4 mg total) by mouth every 6 (six) hours as needed for nausea or vomiting. Patient not taking: Reported on 03/12/2018 07/02/17   Lindell Spar I, NP  terbinafine (LAMISIL) 250 MG tablet Take 1 tablet (250 mg total) by mouth daily. Patient not taking: Reported on 03/12/2018 02/25/18   Garrel Ridgel, DPM    Past Medical, Surgical Family and Social History reviewed and updated.    Objective:   Today's Vitals   03/12/18 1328  BP: 128/78  Pulse: 100  Temp: 98 F (36.7 C)  TempSrc: Oral  SpO2: 98%  Weight: 200 lb 4.8 oz (90.9 kg)  Height: 5\' 2"  (1.575 m)  Wt Readings from Last 3 Encounters:  03/12/18 200 lb 4.8 oz (90.9 kg)  10/09/17 182 lb (82.6 kg)  10/02/17 175 lb (79.4 kg)    Physical Exam Constitutional: Patient appears well-developed and well-nourished. No distress. HENT: Normocephalic, atraumatic, External right and left ear normal. Oropharynx is clear and moist.  Eyes: Conjunctivae and EOM are normal. PERRLA, no scleral icterus. Neck: Normal ROM. Neck supple. No JVD. No tracheal deviation. No thyromegaly. CVS: RRR, S1/S2 +, no murmurs, no gallops, no carotid bruit.  Pulmonary: Effort and breath sounds normal, no stridor, rhonchi, wheezes, rales.  Abdominal: Soft. BS +, no distension, tenderness, rebound or guarding.  Musculoskeletal: Normal range of motion. No edema and no tenderness.  Lymphadenopathy: No lymphadenopathy noted, cervical, inguinal or axillary Neuro: Alert. Normal reflexes, muscle tone coordination. No cranial nerve deficit. Skin: Skin is warm and dry. Dry macular skin patches noted on extremities bilaterally. Not diaphoretic. No erythema. No pallor. Psychiatric: Normal mood and affect. Behavior, judgment, thought content normal.  Assessment & Plan:  1. Grief at loss of child, patient has good family and social support during the time of loss.  She also is receiving the recommended  psychological care from her mental health providers.  She reports that she is stable on her medications at present.  We will refill her hydroxyzine 50 mg 3 times daily as needed for anxiety.  2. Colon cancer screening, Ambulatory referral to Gastroenterology  3. Other atopic dermatitis, continue follow-up with dermatology. Hydroxyzine 50 mg 3 times daily can also be used in addition to anxiety for any itching secondary to dermatitis.  4. Need for pneumococcal vaccination, history of asthma.  Immunization against pneumococcal sections are indicated.    Meds ordered this encounter  Medications  . hydrOXYzine (ATARAX/VISTARIL) 50 MG tablet    Sig: Take 1 tablet (50 mg total) by mouth every 6 (six) hours as needed for anxiety.    Dispense:  60 tablet    Refill:  0    Order Specific Question:   Supervising Provider    Answer:   Tresa Garter [[id number]]   RTC: September for PAP and fasting labs   Carroll Sage. Kenton Kingfisher, MSN, FNP-C [facility]  [address] [place], [place] [address] [phone]

## 2018-03-12 NOTE — Patient Instructions (Addendum)
Colorectal Cancer Screening Colorectal cancer screening is a group of tests used to check for colorectal cancer. Colorectal refers to your colon and rectum. Your colon and rectum are located at the end of your large intestine and carry your bowel movements out of your body. Why is colorectal cancer screening done? It is common for abnormal growths (polyps) to form in the lining of your colon, especially as you get older. These polyps can be cancerous or become cancerous. If colorectal cancer is found at an early stage, it is treatable. Who should be screened for colorectal cancer? Screening is recommended for all adults at average risk starting at age 78. Tests may be recommended every 1 to 10 years. Your health care provider may recommend earlier or more frequent screening if you have:  A history of colorectal cancer or polyps.  A family member with a history of colorectal cancer or polyps.  Inflammatory bowel disease, such as ulcerative colitis or Crohn disease.  A type of hereditary colon cancer syndrome.  Colorectal cancer symptoms.  Types of screening tests There are several types of colorectal screening tests. They include:  Guaiac-based fecal occult blood testing.  Fecal immunochemical test (FIT).  Stool DNA test.  Barium enema.  Virtual colonoscopy.  Sigmoidoscopy. During this test, a sigmoidoscope is used to examine your rectum and lower colon. A sigmoidoscope is a flexible tube with a camera that is inserted through your anus into your rectum and lower colon.  Colonoscopy. During this test, a colonoscope is used to examine your entire colon. A colonoscope is a long, thin, flexible tube with a camera. This test examines your entire colon and rectum.  This information is not intended to replace advice given to you by your health care provider. Make sure you discuss any questions you have with your health care provider. Document Released: 05/16/2010 Document Revised:  07/05/2016 Document Reviewed: 03/04/2014 Elsevier Interactive Patient Education  2018 [employer].           Complicated Grieving Grief is a normal response to the death of someone close to you. Feelings of fear, anger, and guilt can affect almost everyone who loses a loved one. It is also common to have symptoms of depression while you are grieving. These include problems with sleep, loss of appetite, and lack of energy. They may last for weeks or months after a loss. Complicated grief is different from normal grief or depression. Normal grieving involves sadness and feelings of loss, but these feelings are not constant. Complicated grief is a constant and severe type of grief. It interferes with your ability to function normally. It may last for several months to a year or longer. Complicated grief may require treatment from a mental health care provider. What are the causes? It is not known why some people continue to struggle with grief and others do not. You may be at higher risk for complicated grief if:  The death of your loved one was sudden or unexpected.  The death of your loved one was due to a violent event.  Your loved one committed suicide.  Your loved one was a child or a young person.  You were very close to or dependent on the loved one.  You have a history of depression.  What are the signs or symptoms? Signs and symptoms of complicated grief may include:  Feeling disbelief or numbness.  Being unable to enjoy good memories of your loved one.  Needing to avoid anything that reminds you of  your loved one.  Being unable to stop thinking about the death.  Feeling intense anger or guilt.  Feeling alone and hopeless.  Feeling that your life is meaningless and empty.  Losing the desire to live.  How is this diagnosed? Your health care provider may diagnose complicated grief if:  You have constant symptoms of grief for 6-12 months or longer.  Your  symptoms are interfering with your ability to live your life.  Your health care provider may want you to see a mental health care provider. Many symptoms of depression are similar to the symptoms of complicated grief. It is important to be evaluated for complicated grief along with other mental health conditions. How is this treated? Talk therapy with a mental health provider is the most common treatment for complicated grief. During therapy, you will learn healthy ways to cope with the loss of your loved one. In some cases, your mental health care provider may also recommend antidepressant medicines. Follow these instructions at home:  Take care of yourself. ? Eat regular meals and maintain a healthy diet. Eat plenty of fruits, vegetables, and whole grains. ? Try to get some exercise each day. ? Keep regular hours for sleep. Try to get at least 8 hours of sleep each night.  Do not use drugs or alcohol to ease your symptoms.  Take medicines only as directed by your health care provider.  Spend time with friends and loved ones.  Consider joining a grief (bereavement) support group to help you deal with your loss.  Keep all follow-up visits as directed by your health care provider. This is important. Contact a health care provider if:  Your symptoms keep you from functioning normally.  Your symptoms do not get better with treatment. Get help right away if:  You have serious thoughts of hurting yourself or someone else.  You have suicidal feelings. This information is not intended to replace advice given to you by your health care provider. Make sure you discuss any questions you have with your health care provider. Document Released: 11/26/2005 Document Revised: 05/03/2016 Document Reviewed: 05/06/2014 Elsevier Interactive Patient Education  [employer].

## 2018-03-13 ENCOUNTER — Ambulatory Visit: Payer: Medicaid Other | Admitting: Allergy & Immunology

## 2018-03-13 ENCOUNTER — Encounter: Payer: Self-pay | Admitting: Allergy & Immunology

## 2018-03-13 VITALS — BP 110/70 | HR 92 | Temp 97.9°F | Resp 16 | Ht 62.0 in | Wt 201.6 lb

## 2018-03-13 DIAGNOSIS — J3089 Other allergic rhinitis: Secondary | ICD-10-CM

## 2018-03-13 DIAGNOSIS — J984 Other disorders of lung: Secondary | ICD-10-CM

## 2018-03-13 DIAGNOSIS — L301 Dyshidrosis [pompholyx]: Secondary | ICD-10-CM | POA: Diagnosis not present

## 2018-03-13 DIAGNOSIS — Z886 Allergy status to analgesic agent status: Secondary | ICD-10-CM

## 2018-03-13 DIAGNOSIS — J302 Other seasonal allergic rhinitis: Secondary | ICD-10-CM

## 2018-03-13 DIAGNOSIS — T39015A Adverse effect of aspirin, initial encounter: Secondary | ICD-10-CM

## 2018-03-13 DIAGNOSIS — J339 Nasal polyp, unspecified: Secondary | ICD-10-CM

## 2018-03-13 DIAGNOSIS — J4520 Mild intermittent asthma, uncomplicated: Secondary | ICD-10-CM

## 2018-03-13 MED ORDER — CRISABOROLE 2 % EX OINT: 1.0000 "application " | TOPICAL_OINTMENT | Freq: Two times a day (BID) | CUTANEOUS | 3 refills | Status: DC

## 2018-03-13 MED ORDER — PULMICORT 0.5 MG/2ML IN SUSP: RESPIRATORY_TRACT | 5 refills | Status: DC

## 2018-03-13 MED ORDER — LEVOCETIRIZINE DIHYDROCHLORIDE 5 MG PO TABS: 5.0000 mg | ORAL_TABLET | Freq: Every evening | ORAL | 5 refills | Status: DC

## 2018-03-13 NOTE — Progress Notes (Addendum)
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 Mild intermittent asthma, uncomplicated - Lung function looked fairly good today.  - It does not seem that you need a controller medication for your asthma this time. - I think that you have aspirin-exacerbated respiratory disease since you have nasal polyps, asthma, and sensitivity to aspirin. - I would avoid all non-steroidal anti-inflammatory medications in the future. - Information on AERD provided.   2. Seasonal and perennial allergic rhinitis - Testing today showed: trees, grasses, outdoor molds, dust mites, cat and dog - Avoidance measures provided. - Start taking: budeonside nasal rinses twice daily and Xyzal (levocetirizine) 5mg  tablet once daily - You can use an extra dose of the antihistamine, if needed, for breakthrough symptoms.  - Consider nasal saline rinses 1-2 times daily to remove allergens from the nasal cavities as well as help with mucous clearance (this is especially helpful to do before the nasal sprays are given) - Consider allergy shots as a means of long-term control. - Allergy shots "re-train" and "reset" the immune system to ignore environmental allergens and decrease the resulting immune response to those allergens (sneezing, itchy watery eyes, runny nose, nasal congestion, etc).    - Allergy shots improve symptoms in 75-85% of patients.  - We can discuss more at the next appointment if the medications are not working for you.  3. Dyshidrotic eczema - Continue with the  clobetasol ointment. - Add on Eucrisa twice daily as needed to the worst areas (this can help with the itching). - The addition of the Xyzal will also help with the itching. - We can consider the addition of Dupixent in the future if needed.  - We could consider patch testing as well in the future if needed (to evaluate for sensitivity to chemicals).   4. Return in about 2 months (around 05/13/2018).  Subjective:   Stephanie Serrano is a 52 y.o. female presenting today for evaluation of  Chief Complaint  Patient presents with  . Eczema    dermatologist dx knows she has lots of stress  . Pruritis    Stephanie Serrano has a history of the following: Patient Active Problem List   Diagnosis Date Noted  . MDD (major depressive disorder), recurrent episode, moderate ([place]) 06/28/2017  . OCD (obsessive compulsive disorder) 03/26/2017  . Obstructive sleep apnea of adult 01/18/2017  . Insomnia 02/08/2016  . Heel spur 09/01/2015  . Family history of cystitis 08/31/2015  . Left foot pain 08/31/2015  . Prediabetes 08/23/2015  . Hyperlipidemia 08/23/2015  . Depression 03/21/2015  . Acute esophagitis 02/09/2015  . Gastroesophageal reflux disease without esophagitis 08/27/2014  . Dysphagia, unspecified(787.20) 03/22/2014    History obtained from: chart review and patient.  Stephanie Serrano was referred by Scot Jun, FNP.     Stephanie Serrano is a 52 y.o. female presenting for an evaluation of marked pruritis due to her eczema. She reports that the itching started when she had foot surgery in October 2018. Shortly after the surgery, she was in a boot. In November, she developed a blister. This was not felt to be an infection  but instead diagnosed as a fungal infection. Soaks were recommended. Then it spread to her hands as well.   She saw Dr. Delice Lesch at [facility] ans diagnosed with dyshydrotic eczema. She was treated with clobetasol cream that does not seem to work for the itching. She  also tried some dollar tree psoriasis cream that has seemed to have helped somewhat (2% salicylic acid). She was prescribed hydroxyzine yesterday but she has not started it. She does not tolerate Benadryl due to worsening of restless legs syndrome. She also has a similar reaction to Zyrtec. She has not tried Allegra because it gives her backaches. She has not tried Xyzal yet. She is unsure whether there are any foods that trigger her symptoms.   She does report some seasonal allergy symptoms. Worst times of the year are spring and fall. She does not use a nose spray regularly but she has used Flonase in the past. Nasonex works better for her. She has had sinus surgery in the past (Dr. Janace Hoard) somewhere around 2008. This did help initially and seems to have had some sustained improvement. She takes Sudafed on a regular basis as well as Nasonex. She does have a history of nasal polyps.   She does have a history of asthma and uses albuterol only as needed. She was on Qvar at some point but she does not take it regularly. She does premedicating with albuterol prior to physical activity. She does occasionally use aspirin and notices that her nose gets more congested with this. She first noticed this ten years ago. She does not take ibuprofen and is unsure if she has a similar reaction.   Otherwise, there is no history of other atopic diseases, including drug allergies, stinging insect allergies, or urticaria. There is no significant infectious history. Vaccinations are up to date.    Past Medical History: Patient Active Problem List   Diagnosis Date Noted  . MDD (major depressive disorder), recurrent episode, moderate ([place]) 06/28/2017  . OCD (obsessive compulsive disorder) 03/26/2017  . Obstructive sleep apnea of adult 01/18/2017  . Insomnia 02/08/2016  . Heel spur 09/01/2015  . Family history of cystitis 08/31/2015  . Left foot pain 08/31/2015  . Prediabetes 08/23/2015  . Hyperlipidemia 08/23/2015  .  Depression 03/21/2015  . Acute esophagitis 02/09/2015  . Gastroesophageal reflux disease without esophagitis 08/27/2014  . Dysphagia, unspecified(787.20) 03/22/2014    Medication List:  Allergies as of 03/13/2018      Reactions   Azithromycin    Clindamycin/lincomycin    Doxycycline    Swelling of mucous membrane, body pain   Penicillins    Has patient had a PCN reaction causing immediate rash, facial/tongue/throat swelling, SOB or lightheadedness with hypotension: yes Has patient had a PCN reaction causing severe rash involving mucus membranes or skin necrosis: no Has patient had a PCN reaction that required hospitalization: no Has patient had a PCN reaction occurring within the last 10 years: no If all of the above answers are "NO", then may proceed with Cephalosporin use.   Sulfa Antibiotics    Vicodin [hydrocodone-acetaminophen] Nausea Only      Medication List        Accurate as of 03/13/18  3:11 PM. Always use your most recent med list.          albuterol 108 (90 Base) MCG/ACT inhaler Commonly known as:  PROVENTIL HFA;VENTOLIN HFA Inhale 2 puffs into the lungs every 6 (six) hours as needed for wheezing or shortness of breath.   clonazePAM  0.5 MG tablet Commonly known as:  KLONOPIN Take 0.5 mg by mouth 2 (two) times daily.   Crisaborole 2 % Oint Commonly known as:  EUCRISA Apply 1 application topically 2 (two) times daily.   FLUoxetine 20 MG capsule Commonly known as:  PROZAC Take 3 capsules (60 mg total) by mouth daily. For depression   hydrOXYzine 50 MG tablet Commonly known as:  ATARAX/VISTARIL Take 1 tablet (50 mg total) by mouth every 6 (six) hours as needed for anxiety.   levocetirizine 5 MG tablet Commonly known as:  XYZAL Take 1 tablet (5 mg total) by mouth every evening.   LINZESS 72 MCG capsule Generic drug:  linaclotide TAKE 1 CAPSULE BY MOUTH DAILY BEFORE BREAKFAST   polyethylene glycol packet Commonly known as:  MIRALAX Take 17 g by mouth  daily.   promethazine 25 MG tablet Commonly known as:  PHENERGAN Take 1 tablet (25 mg total) by mouth every 8 (eight) hours as needed.   PULMICORT 0.5 MG/2ML nebulizer solution Generic drug:  budesonide Use as directed for nasal irrigation twice daiy   traZODone 50 MG tablet Commonly known as:  DESYREL Take 1 tablet (50 mg total) by mouth at bedtime as needed for sleep.       Birth History: non-contributory.    Developmental History: non-contributory.   Past Surgical History: Past Surgical History:  Procedure Laterality Date  . ESOPHAGOGASTRODUODENOSCOPY N/A 02/09/2015   Procedure: ESOPHAGOGASTRODUODENOSCOPY (EGD);  Surgeon: Inda Castle, MD;  Location: [facility];  Service: Endoscopy;  Laterality: N/A;  with dilation  . FOOT SURGERY    . NASAL SINUS SURGERY    . PLANTAR FASCIA RELEASE Right 08/03/2016   Procedure: PLANTAR FASCIA RELEASE WITH BONE SPUR EXCISION;  Surgeon: Dorna Leitz, MD;  Location: [facility];  Service: Orthopedics;  Laterality: Right;  . TONSILLECTOMY       Family History: Family History  Problem Relation Age of Onset  . Pancreatic cancer Father   . Allergic rhinitis Father   . Asthma Father   . Diabetes Mother   . Heart disease Mother   . Allergic rhinitis Mother   . Anxiety disorder Brother   . Diabetes Paternal Grandmother   . Esophageal cancer Neg Hx   . Stomach cancer Neg Hx   . Eczema Neg Hx   . Urticaria Neg Hx      Social History: Stephanie Serrano lives at home with her family. She lives in an apartment that is 52 years old. There is carpeting throughout the home. They have electric heating and central cooling. There is a dog in the home. There are dust mite coverings on the bedding. There is no tobacco exposure in the home.     Review of Systems: a 14-point review of systems is pertinent for what is mentioned in HPI.  Otherwise, all other systems were negative. Constitutional: negative other than that listed in the HPI Eyes:  negative other than that listed in the HPI Ears, nose, mouth, throat, and face: negative other than that listed in the HPI Respiratory: negative other than that listed in the HPI Cardiovascular: negative other than that listed in the HPI Gastrointestinal: negative other than that listed in the HPI Genitourinary: negative other than that listed in the HPI Integument: negative other than that listed in the HPI Hematologic: negative other than that listed in the HPI Musculoskeletal: negative other than that listed in the HPI Neurological: negative other than that listed in the HPI Allergy/Immunologic: negative other than that  listed in the HPI    Objective:   Blood pressure 110/70, pulse 92, temperature 97.9 F (36.6 C), temperature source Oral, resp. rate 16, height 5\' 2"  (1.575 m), weight 201 lb 9.6 oz (91.4 kg), last menstrual period 01/17/2017. Body mass index is 36.87 kg/m.   Physical Exam:  General: Alert, interactive, in no acute distress. Pleasant female. Eyes: No conjunctival injection bilaterally, no discharge on the right, no discharge on the left and no Horner-Trantas dots present. PERRL bilaterally. EOMI without pain. No photophobia.  Ears: Right TM pearly gray with normal light reflex, Left TM pearly gray with normal light reflex, Right TM intact without perforation and Left TM intact without perforation.  Nose/Throat: External nose within normal limits, nasal crease present and septum midline. Nasal polyp present on the left side. Turbinates markedly edematous and pale with clear discharge. Posterior oropharynx erythematous with cobblestoning in the posterior oropharynx. Tonsils 2+ without exudates.  Tongue without thrush. Neck: Supple without thyromegaly. Trachea midline. Adenopathy: no enlarged lymph nodes appreciated in the anterior cervical, occipital, axillary, epitrochlear, inguinal, or popliteal regions. Lungs: Clear to auscultation without wheezing, rhonchi or rales.  No increased work of breathing. CV: Normal S1/S2. No murmurs. Capillary refill <2 seconds.  Abdomen: Nondistended, nontender. No guarding or rebound tenderness. Bowel sounds present in all fields and hypoactive  Skin: Warm and dry, without lesions or rashes. Extremities:  No clubbing, cyanosis or edema. Neuro:   Grossly intact. No focal deficits appreciated. Responsive to questions.  Diagnostic studies:   Spirometry: results normal (FEV1: 2.12/89%, FVC: 2.65/90%, FEV1/FVC: 80%).    Spirometry consistent with normal pattern.  Allergy Studies:   Indoor/Outdoor Percutaneous Adult Environmental Panel: positive to johnson grass, [place] blue grass, meadow fescue grass, perennial rye grass, sweet vernal grass, timothy grass, ash, birch, American beech, red cedar, oak, pecan pollen, Alternaria, Drechslera, Dp mites, cat, dog and mixed feather. Otherwise negative with adequate controls.  Most Common Foods Panel (sesame, peanut, cashew, soy, fish mix, shellfish mix, wheat, milk, egg): negative to all with adequate controls   Allergy testing results were read and interpreted by myself, documented by clinical staff.     Salvatore Marvel, MD [facility]

## 2018-03-13 NOTE — Patient Instructions (Signed)
1. Mild intermittent asthma, uncomplicated - Lung function looked fairly good today.  - It does not seem that you need a controller medication for your asthma this time. - I think that you have aspirin-exacerbated respiratory disease since you have nasal polyps, asthma, and sensitivity to aspirin. - I would avoid all non-steroidal anti-inflammatory medications in the future. - Information on AERD provided.   2. Seasonal and perennial allergic rhinitis - Testing today showed: trees, grasses, outdoor molds, dust mites, cat and dog - Avoidance measures provided. - Start taking: budeonside nasal rinses twice daily and Xyzal (levocetirizine) 5mg  tablet once daily - You can use an extra dose of the antihistamine, if needed, for breakthrough symptoms.  - Consider nasal saline rinses 1-2 times daily to remove allergens from the nasal cavities as well as help with mucous clearance (this is especially helpful to do before the nasal sprays are given) - Consider allergy shots as a means of long-term control. - Allergy shots "re-train" and "reset" the immune system to ignore environmental allergens and decrease the resulting immune response to those allergens (sneezing, itchy watery eyes, runny nose, nasal congestion, etc).    - Allergy shots improve symptoms in 75-85% of patients.  - We can discuss more at the next appointment if the medications are not working for you.  3. Dyshidrotic eczema - Continue with the clobetasol ointment. - Add on Eucrisa twice daily as needed to the worst areas (this can help with the itching). - The addition of the Xyzal will also help with the itching. - We can consider the addition of Dupixent in the future if needed.  - We could consider patch testing as well in the future if needed (to evaluate for sensitivity to chemicals).   4. Return in about 2 months (around 05/13/2018).   Please inform us of any Emergency Department visits, hospitalizations, or changes in  symptoms. Call us before going to the ED for breathing or allergy symptoms since we might be able to fit you in for a sick visit. Feel free to contact us anytime with any questions, problems, or concerns.  It was a pleasure to meet you today!  Websites that have reliable patient information: 1. American Academy of Asthma, Allergy, and Immunology: www.aaaai.org 2. Food Allergy Research and Education (FARE): foodallergy.org 3. Mothers of Asthmatics: http://www.asthmacommunitynetwork.org 4. American College of Allergy, Asthma, and Immunology: www.acaai.org   Budesonide (Pulmicort) + Saline Irrigation/Rinse  Budesonide (Pulmicort) is an anti-inflammatory steroid medication used to decrease nasal and sinus inflammation. It is dispensed in liquid form in a vial. Although it is manufactured for use with a nebulizer, we intend for you to use it with the NeilMed Sinus Rinse bottle (preferred) or a Neti pot.   Instructions:  1) Make 240cc of saline in the NeilMed bottle using the salt packets or your own saline recipe (see separate handout).  2) Add the entire 2cc vial of liquid Budesonide (Pulmicort) to the rinse bottle and mix together.  3) While in the shower or over the sink, tilt your head forward to a comfortable level. Put the tip of the sinus rinse bottle in your nostril and aim it towards the crown or top of your head. Gently squeeze the bottle to flush out your nose. The fluid will circulate in and out of your sinus cavities, coming back out from either nostril or through your mouth. Try not to swallow large quantities and spit it out instead.  4) Perform Budesonide (Pulmicort) + Saline irrigations 2 times  daily.  Reducing Pollen Exposure  The American Academy of Allergy, Asthma and Immunology suggests the following steps to reduce your exposure to pollen during allergy seasons.    1. Do not hang sheets or clothing out to dry; pollen may collect on these items. 2. Do not mow lawns or  spend time around freshly cut grass; mowing stirs up pollen. 3. Keep windows closed at night.  Keep car windows closed while driving. 4. Minimize morning activities outdoors, a time when pollen counts are usually at their highest. 5. Stay indoors as much as possible when pollen counts or humidity is high and on windy days when pollen tends to remain in the air longer. 6. Use air conditioning when possible.  Many air conditioners have filters that trap the pollen spores. 7. Use a HEPA room air filter to remove pollen form the indoor air you breathe.  Control of Mold Allergen   Mold and fungi can grow on a variety of surfaces provided certain temperature and moisture conditions exist.  Outdoor molds grow on plants, decaying vegetation and soil.  The major outdoor mold, Alternaria and Cladosporium, are found in very high numbers during hot and dry conditions.  Generally, a late Summer - Fall peak is seen for common outdoor fungal spores.  Rain will temporarily lower outdoor mold spore count, but counts rise rapidly when the rainy period ends.  The most important indoor molds are Aspergillus and Penicillium.  Dark, humid and poorly ventilated basements are ideal sites for mold growth.  The next most common sites of mold growth are the bathroom and the kitchen.  Outdoor (Seasonal) Mold Control  Positive outdoor molds via skin testing: Alternaria and Drechslera (Curvalaria)  1. Use air conditioning and keep windows closed 2. Avoid exposure to decaying vegetation. 3. Avoid leaf raking. 4. Avoid grain handling. 5. Consider wearing a face mask if working in moldy areas.    Control of Mold Allergen   Mold and fungi can grow on a variety of surfaces provided certain temperature and moisture conditions exist.  Outdoor molds grow on plants, decaying vegetation and soil.  The major outdoor mold, Alternaria and Cladosporium, are found in very high numbers during hot and dry conditions.  Generally, a late  Summer - Fall peak is seen for common outdoor fungal spores.  Rain will temporarily lower outdoor mold spore count, but counts rise rapidly when the rainy period ends.  The most important indoor molds are Aspergillus and Penicillium.  Dark, humid and poorly ventilated basements are ideal sites for mold growth.  The next most common sites of mold growth are the bathroom and the kitchen.  Outdoor (Seasonal) Mold Control  Positive outdoor molds via skin testing: Alternaria and Drechslera (Curvalaria)  6. Use air conditioning and keep windows closed 7. Avoid exposure to decaying vegetation. 8. Avoid leaf raking. 9. Avoid grain handling. 10. Consider wearing a face mask if working in moldy areas.     Control of House Dust Mite Allergen    House dust mites play a major role in allergic asthma and rhinitis.  They occur in environments with high humidity wherever human skin, the food for dust mites is found. High levels have been detected in dust obtained from mattresses, pillows, carpets, upholstered furniture, bed covers, clothes and soft toys.  The principal allergen of the house dust mite is found in its feces.  A gram of dust may contain 1,000 mites and 250,000 fecal particles.  Mite antigen is easily measured in the  air during house cleaning activities.    1. Encase mattresses, including the box spring, and pillow, in an air tight cover.  Seal the zipper end of the encased mattresses with wide adhesive tape. 2. Wash the bedding in water of 130 degrees Farenheit weekly.  Avoid cotton comforters/quilts and flannel bedding: the most ideal bed covering is the dacron comforter. 3. Remove all upholstered furniture from the bedroom. 4. Remove carpets, carpet padding, rugs, and non-washable window drapes from the bedroom.  Wash drapes weekly or use plastic window coverings. 5. Remove all non-washable stuffed toys from the bedroom.  Wash stuffed toys weekly. 6. Have the room cleaned frequently with a  vacuum cleaner and a damp dust-mop.  The patient should not be in a room which is being cleaned and should wait 1 hour after cleaning before going into the room. 7. Close and seal all heating outlets in the bedroom.  Otherwise, the room will become filled with dust-laden air.  An electric heater can be used to heat the room. 8. Reduce indoor humidity to less than 50%.  Do not use a humidifier.

## 2018-03-18 ENCOUNTER — Ambulatory Visit: Payer: Medicaid Other | Admitting: Allergy and Immunology

## 2018-03-25 ENCOUNTER — Encounter: Payer: Self-pay | Admitting: Podiatry

## 2018-03-25 NOTE — Progress Notes (Signed)
[place] envelope of requested medical records from [facility] with [facility] was put up front to be mailed out tomorrow, Wednesday, 17 April to the following address;  [address], [place].

## 2018-04-03 ENCOUNTER — Telehealth: Payer: Self-pay | Admitting: Allergy & Immunology

## 2018-04-03 NOTE — Telephone Encounter (Signed)
Patient was seen a few weeks ago Patient was given eucrissa - not helping Is there anything else the patient can do?? Patient was talked to about getting potential injections as well Please call to ansswer any questions

## 2018-04-03 NOTE — Telephone Encounter (Signed)
Please advise 

## 2018-04-03 NOTE — Telephone Encounter (Signed)
I called the patient to discuss her symptoms. LVM asking her to call me back.  Salvatore Marvel, MD [facility]

## 2018-04-07 ENCOUNTER — Telehealth: Payer: Self-pay | Admitting: Allergy & Immunology

## 2018-04-07 ENCOUNTER — Ambulatory Visit: Payer: Medicaid Other | Admitting: Allergy and Immunology

## 2018-04-07 NOTE — Telephone Encounter (Signed)
Pt called and left message and you called her back and she was in hospital and now she is returning your call.

## 2018-04-08 ENCOUNTER — Ambulatory Visit: Payer: Medicaid Other | Admitting: Family Medicine

## 2018-04-08 MED ORDER — PREDNISONE 10 MG PO TABS: ORAL_TABLET | ORAL | 0 refills | Status: DC

## 2018-04-08 NOTE — Telephone Encounter (Signed)
I called Autry back to discuss her questions and concerns. LVM asking her to call me back in [place].  Salvatore Marvel, MD [facility]

## 2018-04-08 NOTE — Telephone Encounter (Signed)
I will start submit and reach out to patient

## 2018-04-08 NOTE — Telephone Encounter (Signed)
I talked to Ms. Talkington to discuss her symptoms. She reports that the eczema on her hands and feet is getting worse despite the ointments. She has seen Dermatology in the past who has diagnosed her with eczema. I will send in a prednisone taper to help provide more immediate relief.   We will also start the submission process for Dupixent. I did discuss the medication with Claudetta and told her that Tammy would be reaching out to her to discuss the approval process more.  Review: she has failed multiple topical steroids (triamcinolone, clobetasol, and hydrocortisone). She also failed [place] most recently and started herself on salicylic acid without improvement.   Salvatore Marvel, MD [facility] [place]

## 2018-04-14 ENCOUNTER — Ambulatory Visit (AMBULATORY_SURGERY_CENTER): Payer: Self-pay

## 2018-04-14 ENCOUNTER — Other Ambulatory Visit: Payer: Self-pay

## 2018-04-14 VITALS — Ht 62.0 in | Wt 209.2 lb

## 2018-04-14 DIAGNOSIS — Z1211 Encounter for screening for malignant neoplasm of colon: Secondary | ICD-10-CM

## 2018-04-14 MED ORDER — NA SULFATE-K SULFATE-MG SULF 17.5-3.13-1.6 GM/177ML PO SOLN: 1.0000 | Freq: Once | ORAL | 0 refills | Status: AC

## 2018-04-14 NOTE — Progress Notes (Signed)
Denies allergies to eggs or soy products. Denies complication of anesthesia or sedation. Denies use of weight loss medication. Denies use of O2.   Emmi instructions declined.  

## 2018-04-17 ENCOUNTER — Ambulatory Visit (INDEPENDENT_AMBULATORY_CARE_PROVIDER_SITE_OTHER): Payer: Medicaid Other

## 2018-04-17 ENCOUNTER — Ambulatory Visit: Payer: Medicaid Other | Admitting: Podiatry

## 2018-04-17 ENCOUNTER — Encounter: Payer: Self-pay | Admitting: Podiatry

## 2018-04-17 DIAGNOSIS — M7751 Other enthesopathy of right foot: Secondary | ICD-10-CM | POA: Diagnosis not present

## 2018-04-17 DIAGNOSIS — M722 Plantar fascial fibromatosis: Secondary | ICD-10-CM | POA: Diagnosis not present

## 2018-04-17 NOTE — Progress Notes (Signed)
She presents today with chief complaint of pain to the medial aspect of the right foot.  She states that seems to be around the first metatarsophalangeal joint and radiating proximally around the medial side.  Objective: Vital signs are stable she is alert and oriented x3.  Pulses are palpable.  She has pain to palpation medial calcaneal tubercle of the right heel with radiating pain up the medial aspect of the foot.  Radiographs taken today demonstrate K wires in place first metatarsal right foot.  Assessment: Plantar fasciitis.  Plan: I injected the area today with 20 mg Kenalog 5 mg Marcaine after sterile Betadine skin prep tolerated procedure well without complications.  Follow-up with her on an as-needed basis.  Does not want take any oral medication like to have orthotics however she is Medicaid.  So she says she would like to have a plantar fascial release but it appears that she is already had one on the right foot at some point.  Follow-up with her in 1 month

## 2018-04-22 ENCOUNTER — Ambulatory Visit (INDEPENDENT_AMBULATORY_CARE_PROVIDER_SITE_OTHER): Payer: Medicaid Other | Admitting: *Deleted

## 2018-04-22 DIAGNOSIS — L209 Atopic dermatitis, unspecified: Secondary | ICD-10-CM | POA: Diagnosis not present

## 2018-04-22 DIAGNOSIS — J4520 Mild intermittent asthma, uncomplicated: Secondary | ICD-10-CM

## 2018-04-24 ENCOUNTER — Other Ambulatory Visit: Payer: Self-pay

## 2018-04-24 ENCOUNTER — Encounter: Payer: Self-pay | Admitting: Gastroenterology

## 2018-04-24 ENCOUNTER — Ambulatory Visit (AMBULATORY_SURGERY_CENTER): Payer: Medicaid Other | Admitting: Gastroenterology

## 2018-04-24 VITALS — BP 130/74 | HR 75 | Temp 98.4°F | Resp 18 | Ht 62.0 in | Wt 209.0 lb

## 2018-04-24 DIAGNOSIS — D127 Benign neoplasm of rectosigmoid junction: Secondary | ICD-10-CM | POA: Diagnosis not present

## 2018-04-24 DIAGNOSIS — Z1212 Encounter for screening for malignant neoplasm of rectum: Secondary | ICD-10-CM

## 2018-04-24 DIAGNOSIS — D120 Benign neoplasm of cecum: Secondary | ICD-10-CM

## 2018-04-24 DIAGNOSIS — K635 Polyp of colon: Secondary | ICD-10-CM

## 2018-04-24 DIAGNOSIS — D125 Benign neoplasm of sigmoid colon: Secondary | ICD-10-CM | POA: Diagnosis not present

## 2018-04-24 DIAGNOSIS — D128 Benign neoplasm of rectum: Secondary | ICD-10-CM

## 2018-04-24 DIAGNOSIS — Z1211 Encounter for screening for malignant neoplasm of colon: Secondary | ICD-10-CM | POA: Diagnosis not present

## 2018-04-24 DIAGNOSIS — K621 Rectal polyp: Secondary | ICD-10-CM

## 2018-04-24 DIAGNOSIS — D129 Benign neoplasm of anus and anal canal: Secondary | ICD-10-CM

## 2018-04-24 MED ORDER — SODIUM CHLORIDE 0.9 % IV SOLN: 500.0000 mL | Freq: Once | INTRAVENOUS | Status: DC

## 2018-04-24 NOTE — Patient Instructions (Signed)
6 polyps today removed. Await pathology.  YOU HAD AN ENDOSCOPIC PROCEDURE TODAY AT [facility]:   Refer to the procedure report that was given to you for any specific questions about what was found during the examination.  If the procedure report does not answer your questions, please call your gastroenterologist to clarify.  If you requested that your care partner not be given the details of your procedure findings, then the procedure report has been included in a sealed envelope for you to review at your convenience later.  YOU SHOULD EXPECT: Some feelings of bloating in the abdomen. Passage of more gas than usual.  Walking can help get rid of the air that was put into your GI tract during the procedure and reduce the bloating. If you had a lower endoscopy (such as a colonoscopy or flexible sigmoidoscopy) you may notice spotting of blood in your stool or on the toilet paper. If you underwent a bowel prep for your procedure, you may not have a normal bowel movement for a few days.  Please Note:  You might notice some irritation and congestion in your nose or some drainage.  This is from the oxygen used during your procedure.  There is no need for concern and it should clear up in a day or so.  SYMPTOMS TO REPORT IMMEDIATELY:   Following lower endoscopy (colonoscopy or flexible sigmoidoscopy):  Excessive amounts of blood in the stool  Significant tenderness or worsening of abdominal pains  Swelling of the abdomen that is new, acute  Fever of 100F or higher   For urgent or emergent issues, a gastroenterologist can be reached at any hour by calling [phone].   DIET:  We do recommend a small meal at first, but then you may proceed to your regular diet.  Drink plenty of fluids but you should avoid alcoholic beverages for 24 hours.  ACTIVITY:  You should plan to take it easy for the rest of today and you should NOT DRIVE or use heavy machinery until tomorrow (because of the  sedation medicines used during the test).    FOLLOW UP: Our staff will call the number listed on your records the next business day following your procedure to check on you and address any questions or concerns that you may have regarding the information given to you following your procedure. If we do not reach you, we will leave a message.  However, if you are feeling well and you are not experiencing any problems, there is no need to return our call.  We will assume that you have returned to your regular daily activities without incident.  If any biopsies were taken you will be contacted by phone or by letter within the next 1-3 weeks.  Please call us at [phone] if you have not heard about the biopsies in 3 weeks.    SIGNATURES/CONFIDENTIALITY: You and/or your care partner have signed paperwork which will be entered into your electronic medical record.  These signatures attest to the fact that that the information above on your After Visit Summary has been reviewed and is understood.  Full responsibility of the confidentiality of this discharge information lies with you and/or your care-partner.

## 2018-04-24 NOTE — Op Note (Signed)
[facility] Patient Name: Stephanie Serrano Procedure Date: 04/24/2018 1:31 PM MRN: [id number] Endoscopist: Remo Lipps P. Armbruster MD, MD Age: 52 Referring MD:  Date of Birth: Nov 10, 1966 Gender: Female Account #: [id number] Procedure:                Colonoscopy Indications:              Screening for colorectal malignant neoplasm, This                            is the patient's first colonoscopy Medicines:                Monitored Anesthesia Care Procedure:                Pre-Anesthesia Assessment:                           - Prior to the procedure, a History and Physical                            was performed, and patient medications and                            allergies were reviewed. The patient's tolerance of                            previous anesthesia was also reviewed. The risks                            and benefits of the procedure and the sedation                            options and risks were discussed with the patient.                            All questions were answered, and informed consent                            was obtained. Prior Anticoagulants: The patient has                            taken no previous anticoagulant or antiplatelet                            agents. ASA Grade Assessment: II - A patient with                            mild systemic disease. After reviewing the risks                            and benefits, the patient was deemed in                            satisfactory condition to undergo the procedure.  After obtaining informed consent, the colonoscope                            was passed under direct vision. Throughout the                            procedure, the patient's blood pressure, pulse, and                            oxygen saturations were monitored continuously. The                            Model CF-HQ190L [id number]) scope was introduced                            through the  anus and advanced to the the cecum,                            identified by appendiceal orifice and ileocecal                            valve. The colonoscopy was performed without                            difficulty. The patient tolerated the procedure                            well. The quality of the bowel preparation was                            good. The ileocecal valve, appendiceal orifice, and                            rectum were photographed. Scope In: 1:34:54 PM Scope Out: 1:53:36 PM Total Procedure Duration: 0 hours 18 minutes 42 seconds  Findings:                 The perianal and digital rectal examinations were                            normal.                           Four sessile polyps were found in the cecum. The                            polyps were 2 to 8 mm in size. These polyps were                            removed with a cold snare. Resection and retrieval                            were complete.  A 4 mm polyp was found in the sigmoid colon. The                            polyp was sessile. The polyp was removed with a                            cold snare. Resection and retrieval were complete.                           A 5 mm polyp was found in the rectum. The polyp was                            sessile. The polyp was removed with a cold snare.                            Resection and retrieval were complete.                           Anal papilla(e) were hypertrophied.                           The exam was otherwise without abnormality. Complications:            No immediate complications. Estimated blood loss:                            Minimal. Estimated Blood Loss:     Estimated blood loss was minimal. Impression:               - Four 2 to 8 mm polyps in the cecum, removed with                            a cold snare. Resected and retrieved.                           - One 4 mm polyp in the sigmoid colon, removed with                             a cold snare. Resected and retrieved.                           - One 5 mm polyp in the rectum, removed with a cold                            snare. Resected and retrieved.                           - Anal papilla(e) were hypertrophied.                           - The examination was otherwise normal. Recommendation:           - Patient has a contact number available for  emergencies. The signs and symptoms of potential                            delayed complications were discussed with the                            patient. Return to normal activities tomorrow.                            Written discharge instructions were provided to the                            patient.                           - Resume previous diet.                           - Continue present medications.                           - Await pathology results.                           - Repeat colonoscopy for surveillance based on                            pathology results. Remo Lipps P. Armbruster MD, MD 04/24/2018 1:59:33 PM This report has been signed electronically.

## 2018-04-24 NOTE — Progress Notes (Signed)
Called to room to assist during endoscopic procedure.  Patient ID and intended procedure confirmed with present staff. Received instructions for my participation in the procedure from the performing physician.  

## 2018-04-24 NOTE — Progress Notes (Signed)
A and O x3. Report to RN. Tolerated MAC anesthesia well.

## 2018-04-25 ENCOUNTER — Telehealth: Payer: Self-pay

## 2018-04-25 ENCOUNTER — Telehealth: Payer: Self-pay | Admitting: *Deleted

## 2018-04-25 ENCOUNTER — Encounter: Payer: Self-pay | Admitting: *Deleted

## 2018-04-25 NOTE — Telephone Encounter (Signed)
Erroneous encounter

## 2018-04-25 NOTE — Telephone Encounter (Signed)
Attempted to reach patient for post-procedure f/u call. No answer. Left message that we will make another attempt to reach her again later today and for her to please not hesitate to call us if she has any questions/concerns regarding her care. 

## 2018-04-25 NOTE — Telephone Encounter (Signed)
No answer. Number identifier. Left message to call if questions or concerns.

## 2018-04-25 NOTE — Telephone Encounter (Signed)
Pt returned call and said she is doing fine from her procedure yesterday

## 2018-04-28 ENCOUNTER — Ambulatory Visit (INDEPENDENT_AMBULATORY_CARE_PROVIDER_SITE_OTHER): Payer: Medicaid Other | Admitting: Family Medicine

## 2018-04-28 ENCOUNTER — Encounter: Payer: Self-pay | Admitting: Family Medicine

## 2018-04-28 VITALS — BP 114/74 | HR 92 | Temp 97.7°F | Ht 62.0 in | Wt 210.6 lb

## 2018-04-28 DIAGNOSIS — Z09 Encounter for follow-up examination after completed treatment for conditions other than malignant neoplasm: Secondary | ICD-10-CM | POA: Diagnosis not present

## 2018-04-28 DIAGNOSIS — Z9289 Personal history of other medical treatment: Secondary | ICD-10-CM

## 2018-04-28 DIAGNOSIS — F419 Anxiety disorder, unspecified: Secondary | ICD-10-CM

## 2018-04-28 DIAGNOSIS — Z23 Encounter for immunization: Secondary | ICD-10-CM | POA: Diagnosis not present

## 2018-04-28 DIAGNOSIS — R109 Unspecified abdominal pain: Secondary | ICD-10-CM | POA: Diagnosis not present

## 2018-04-28 DIAGNOSIS — E669 Obesity, unspecified: Secondary | ICD-10-CM

## 2018-04-28 LAB — POCT URINALYSIS DIP (MANUAL ENTRY)
Bilirubin, UA: NEGATIVE
Blood, UA: NEGATIVE
Glucose, UA: NEGATIVE mg/dL
Ketones, POC UA: NEGATIVE mg/dL
Leukocytes, UA: NEGATIVE
Nitrite, UA: NEGATIVE
Protein Ur, POC: NEGATIVE mg/dL
Spec Grav, UA: 1.015 (ref 1.010–1.025)
Urobilinogen, UA: 0.2 E.U./dL
pH, UA: 7 (ref 5.0–8.0)

## 2018-04-28 MED ORDER — MEASLES, MUMPS & RUBELLA VAC ~~LOC~~ INJ: 0.5000 mL | INJECTION | Freq: Once | SUBCUTANEOUS | Status: DC

## 2018-04-28 NOTE — Progress Notes (Addendum)
Subjective:     Patient ID: Stephanie Serrano, female   DOB: 07/16/1966, 52 y.o.   MRN: [id number]  PCP: Kathe Becton, NP  Chief Complaint  Patient presents with  . weight gain  . Abdominal Pain    Current Status: Patient states that since her last office visit, she had been having increased mid abdominal pain with activity since her weight began to increase. She experienced this abdominal pain, mildly for the first time 14 years ago after a hard labor with her daughter. She denies fevers, chills, recent infections, increased fatigue, weight loss, and night sweats. Denies cough, shortness of breath, heart palpitations, and chest pain.   She denies visual changes, dizziness, unsteadiness, and falls.   She states that she recently has her 1st Colonoscopy screening on 04/24/2018. She had 6 polyps removed, which were sent to pathology. She states that her appetite is good. She has normal stools. She denies nausea, vomiting, diarrhea, and blood in stools. Denies any other incidents of bleeding. She urinates normally, no dysuria or hematuria.   She has been having mild anxiety since the loss of her daughter almost 6 months ago. She states that she is currently seeing a Counselor on a weekly basis, and a Psychiatrist every 2 weeks to help with her grieving process.   She is inquiring about possibly getting the Measles vaccine today.  She denies pain, except abdominal pain with activity.   Past Medical History:  Diagnosis Date  . Allergy   . Anemia   . Anxiety   . Arthritis   . Asthma   . Blood transfusion without reported diagnosis   . Depression   . Eczema   . Esophageal stricture   . GERD (gastroesophageal reflux disease)   . Migraines   . OCD (obsessive compulsive disorder)   . Plantar fasciitis of right foot   . RLS (restless legs syndrome)   . Vertigo    Family History  Problem Relation Age of Onset  . Pancreatic cancer Father   . Allergic rhinitis Father   . Asthma Father    . Stomach cancer Father   . Diabetes Mother   . Heart disease Mother   . Allergic rhinitis Mother   . Anxiety disorder Brother   . Diabetes Paternal Grandmother   . Colon cancer Maternal Grandmother   . Esophageal cancer Neg Hx   . Eczema Neg Hx   . Urticaria Neg Hx   . Liver cancer Neg Hx   . Rectal cancer Neg Hx    Immunization History  Administered Date(s) Administered  . Influenza,inj,Quad PF,6+ Mos 08/22/2015, 10/19/2016  . Pneumococcal Polysaccharide-23 03/12/2018  . Tdap 10/19/2016    Current Outpatient Medications on File Prior to Visit  Medication Sig Dispense Refill  . albuterol (PROVENTIL HFA;VENTOLIN HFA) 108 (90 Base) MCG/ACT inhaler Inhale 2 puffs into the lungs every 6 (six) hours as needed for wheezing or shortness of breath.    . clonazePAM (KLONOPIN) 0.5 MG tablet Take 0.5 mg by mouth 2 (two) times daily.  0  . Crisaborole (EUCRISA) 2 % OINT Apply 1 application topically 2 (two) times daily. 60 g 3  . FLUoxetine (PROZAC) 20 MG capsule Take 3 capsules (60 mg total) by mouth daily. For depression 30 capsule 0  . hydrOXYzine (ATARAX/VISTARIL) 50 MG tablet Take 1 tablet (50 mg total) by mouth every 6 (six) hours as needed for anxiety. 60 tablet 0  . levocetirizine (XYZAL) 5 MG tablet Take 1 tablet (5 mg total) by  mouth every evening. 30 tablet 5  . LINZESS 72 MCG capsule TAKE 1 CAPSULE BY MOUTH DAILY BEFORE BREAKFAST 30 capsule 0  . polyethylene glycol (MIRALAX) packet Take 17 g by mouth daily. 14 each 0  . promethazine (PHENERGAN) 25 MG tablet Take 1 tablet (25 mg total) by mouth every 8 (eight) hours as needed. 20 tablet 0  . PULMICORT 0.5 MG/2ML nebulizer solution Use as directed for nasal irrigation twice daiy 120 mL 5  . traZODone (DESYREL) 50 MG tablet Take 1 tablet (50 mg total) by mouth at bedtime as needed for sleep. 30 tablet 0   Current Facility-Administered Medications on File Prior to Visit  Medication Dose Route Frequency Provider Last Rate Last Dose   . 0.9 %  sodium chloride infusion  500 mL Intravenous Once Armbruster, Carlota Raspberry, MD       Allergies  Allergen Reactions  . Azithromycin   . Clindamycin/Lincomycin   . Doxycycline     Swelling of mucous membrane, body pain  . Penicillins     Has patient had a PCN reaction causing immediate rash, facial/tongue/throat swelling, SOB or lightheadedness with hypotension: yes Has patient had a PCN reaction causing severe rash involving mucus membranes or skin necrosis: no Has patient had a PCN reaction that required hospitalization: no Has patient had a PCN reaction occurring within the last 10 years: no If all of the above answers are "NO", then may proceed with Cephalosporin use.   . Sulfa Antibiotics   . Vicodin [Hydrocodone-Acetaminophen] Nausea Only    Review of Systems  Constitutional: Negative.   HENT: Negative.   Eyes: Negative.   Respiratory: Negative.   Gastrointestinal: Positive for abdominal distention and abdominal pain (mid abdominal pain).  Endocrine: Negative.   Genitourinary: Negative.   Musculoskeletal: Negative.   Skin: Negative.   Allergic/Immunologic: Negative.   Neurological: Negative.   Hematological: Negative.   Psychiatric/Behavioral: Negative.    Objective:  Physical Exam  Constitutional: She appears well-developed and well-nourished.  HENT:  Head: Normocephalic and atraumatic.  Mouth/Throat: Oropharynx is clear and moist.  Eyes: Pupils are equal, round, and reactive to light. EOM are normal.  Cardiovascular: Normal rate, regular rhythm, normal heart sounds and intact distal pulses.  Pulmonary/Chest: Effort normal and breath sounds normal.  Abdominal: Soft. Normal appearance. She exhibits distension. Bowel sounds are decreased. There is generalized tenderness (mid-abdominal pain and tenderness). There is guarding.  Neurological: She is alert.  Skin: Skin is warm and dry. Capillary refill takes less than 2 seconds.  Psychiatric: She has a normal mood  and affect. Her behavior is normal.  Nursing note and vitals reviewed.  Assessment:   1. Abdominal pain, unspecified abdominal location 2. Immunization due 3. Anxiety 4. H/O mammogram 5. Obesity (BMI 35.0-39.9 without comorbidity 6. Follow up  Plan:   1. Abdominal pain, unspecified abdominal location Urinalysis is negative today. Colonoscopy was 04/24/2018 and revealed 6 polyps which were removed and sent to pathology. Results are pending. She will follow up with GI for further evaluation of abdominal pain. - POCT urinalysis dipstick  2. Immunization due We will do MMR titer today to assess if need for 2nd Measle vaccine.  - Measles/Mumps/Rubella Immunity  3. Anxiety Stable. She continues to attend counseling sessions on a weekly basis. She will continue to take Klonopin, Trazodone, and Prozac as directed. Monitor.   4. H/O mammogram We will order for Mammogram Screening today.  5. Obesity (BMI 35.0-39.9 without comorbidity) Her BMI is 38.5. We will refer her  for Nutritionist Consult to assess diet management and weight loss.   6. Follow up She will follow up in 6 months for OV/Labs.   Meds ordered this encounter  Medications  . DISCONTD: measles, mumps and rubella vaccine (MMR) injection 0.5 mL   Kathe Becton,  MSN, FNP-C [facility] [address] [place], [place] [address] [phone]

## 2018-04-29 ENCOUNTER — Other Ambulatory Visit: Payer: Self-pay | Admitting: Family Medicine

## 2018-04-29 DIAGNOSIS — Z1231 Encounter for screening mammogram for malignant neoplasm of breast: Secondary | ICD-10-CM

## 2018-04-29 LAB — MEASLES/MUMPS/RUBELLA IMMUNITY
MUMPS ABS, IGG: <9 AU/mL — ABNORMAL LOW (ref >10.9)
RUBEOLA AB, IGG: >300 AU/mL (ref >29.9)
Rubella Antibodies, IGG: 4.04 index (ref >0.99)

## 2018-04-30 ENCOUNTER — Telehealth: Payer: Self-pay | Admitting: Gastroenterology

## 2018-04-30 ENCOUNTER — Encounter: Payer: Self-pay | Admitting: Gastroenterology

## 2018-04-30 NOTE — Telephone Encounter (Signed)
Patient states that since the weekend she has been having RUQ abdominal pain, especially after eating. She still has her gallbladder. She has no lower abdominal pain, bowel movements have returned to normal. Patient wanted to wait to send this to Dr. Havery Moros and not the DOD this afternoon.

## 2018-05-01 ENCOUNTER — Other Ambulatory Visit: Payer: Self-pay

## 2018-05-01 DIAGNOSIS — R1011 Right upper quadrant pain: Secondary | ICD-10-CM

## 2018-05-01 DIAGNOSIS — R110 Nausea: Secondary | ICD-10-CM

## 2018-05-01 DIAGNOSIS — R112 Nausea with vomiting, unspecified: Secondary | ICD-10-CM

## 2018-05-01 NOTE — Telephone Encounter (Signed)
Okay, yes please obtain CBC, LFTs, lipase. She should increase omeprazole to twice daily and let's obtain RUQ [place] to be done ASAP. Thanks

## 2018-05-01 NOTE — Telephone Encounter (Signed)
Does she have any reflux symptoms, nausea / vomiting? It may be reasonable to put her on empiric omeprazole 20mg  once to twice daily and see if this helps. She can take it OTC. She should avoid NSAIDs. If she hasn't had recent labs I would also check LFTs and can consider US of the RUQ to evaluate for gallstones pending her course.

## 2018-05-01 NOTE — Telephone Encounter (Signed)
Left message to call back  

## 2018-05-01 NOTE — Telephone Encounter (Signed)
Patient is having nausea, denies vomiting but has felt like she needed to. She also has been on omeprazole, does not take NSAIDs. She feels like the pain is worse after eating, if bends over states it is like a "knife" is in her stomach. She has not had recent labs so will come do LFT's, patient also would like to proceed with [place] of RUQ. Do you want to order the [place]?

## 2018-05-01 NOTE — Telephone Encounter (Signed)
Left message to call back. [place] is scheduled for 5/29 at [facility] arrive 9:45 for a 10:00 appointment, NPO after midnight. Patient needs to increase omeprazole to BID and do labs.

## 2018-05-01 NOTE — Telephone Encounter (Signed)
Patient advised of [place] and labs. Instructed to increase her omeprazole to BID.

## 2018-05-06 ENCOUNTER — Other Ambulatory Visit (INDEPENDENT_AMBULATORY_CARE_PROVIDER_SITE_OTHER): Payer: Self-pay

## 2018-05-06 DIAGNOSIS — R110 Nausea: Secondary | ICD-10-CM

## 2018-05-06 DIAGNOSIS — R1011 Right upper quadrant pain: Secondary | ICD-10-CM

## 2018-05-06 LAB — CBC WITH DIFFERENTIAL/PLATELET
Basophils Absolute: 0.1 10*3/uL (ref 0.0–0.1)
Basophils Relative: 1.2 % (ref 0.0–3.0)
EOS PCT: 8.2 % — AB (ref 0.0–5.0)
Eosinophils Absolute: 0.6 10*3/uL (ref 0.0–0.7)
HCT: 34.9 % — ABNORMAL LOW (ref 36.0–46.0)
HEMOGLOBIN: 11.7 g/dL — AB (ref 12.0–15.0)
Lymphocytes Relative: 33.4 % (ref 12.0–46.0)
Lymphs Abs: 2.2 10*3/uL (ref 0.7–4.0)
MCHC: 33.5 g/dL (ref 30.0–36.0)
MCV: 88.4 fl (ref 78.0–100.0)
Monocytes Absolute: 0.6 10*3/uL (ref 0.1–1.0)
Monocytes Relative: 8.2 % (ref 3.0–12.0)
Neutro Abs: 3.3 10*3/uL (ref 1.4–7.7)
Neutrophils Relative %: 49 % (ref 43.0–77.0)
Platelets: 253 10*3/uL (ref 150.0–400.0)
RBC: 3.95 Mil/uL (ref 3.87–5.11)
RDW: 14.3 % (ref 11.5–15.5)
WBC: 6.7 10*3/uL (ref 4.0–10.5)

## 2018-05-06 LAB — HEPATIC FUNCTION PANEL
ALT: 16 U/L (ref 0–35)
AST: 13 U/L (ref 0–37)
Albumin: 3.6 g/dL (ref 3.5–5.2)
Alkaline Phosphatase: 95 U/L (ref 39–117)
Bilirubin, Direct: 0 mg/dL (ref 0.0–0.3)
TOTAL PROTEIN: 6.3 g/dL (ref 6.0–8.3)
Total Bilirubin: 0.2 mg/dL (ref 0.2–1.2)

## 2018-05-06 LAB — LIPASE: LIPASE: 32 U/L (ref 11.0–59.0)

## 2018-05-07 ENCOUNTER — Ambulatory Visit (HOSPITAL_COMMUNITY)
Admission: RE | Admit: 2018-05-07 | Discharge: 2018-05-07 | Disposition: A | Discharge status: Home / Self Care | Payer: Medicaid Other | Source: Ambulatory Visit | Attending: Gastroenterology | Admitting: Gastroenterology

## 2018-05-07 DIAGNOSIS — R1011 Right upper quadrant pain: Secondary | ICD-10-CM | POA: Diagnosis present

## 2018-05-07 DIAGNOSIS — R110 Nausea: Secondary | ICD-10-CM

## 2018-05-12 ENCOUNTER — Encounter: Payer: Self-pay | Admitting: Registered"

## 2018-05-12 ENCOUNTER — Telehealth: Payer: Self-pay

## 2018-05-12 ENCOUNTER — Encounter: Payer: Medicaid Other | Attending: Family Medicine | Admitting: Registered"

## 2018-05-12 DIAGNOSIS — Z713 Dietary counseling and surveillance: Secondary | ICD-10-CM | POA: Diagnosis present

## 2018-05-12 DIAGNOSIS — E669 Obesity, unspecified: Secondary | ICD-10-CM | POA: Diagnosis not present

## 2018-05-12 DIAGNOSIS — Z6839 Body mass index (BMI) 39.0-39.9, adult: Secondary | ICD-10-CM | POA: Diagnosis not present

## 2018-05-12 NOTE — Patient Instructions (Addendum)
-   Aim to have 3 well-balanced meals a day.   - Make a list of non-food related activities as alternatives.

## 2018-05-12 NOTE — Telephone Encounter (Signed)
Patient was scheduled for a follow up appointment with Dr Ernst Bowler. She would like to just send a update to Dr Ernst Bowler. Patient is doing very good with the [place] injection everything is going well. She doesn't think she really needs to come in and be seen.

## 2018-05-12 NOTE — Progress Notes (Signed)
  Medical Nutrition Therapy:  Appt start time: 2:10 end time:  3:25.  Assessment:  Primary concerns today: Pt states daughter passed away unexpectedly in 12-04-2017. Pt states she has psychiatrist and counselor she sees once a week. Pt states she also has 52 year old daughter.   Pt states now that she has gotten bigger it is more disappointing. Pt states she feels like she needs gastric banding. Pt states she knows what to do with eating balanced meals because she used to work in a gym. Pt states she emotionally eats. Pt states her weakness is sweets. Pt states she skips meals sometimes and then some days she has a schedule.   Pt states she was in behavioral health for 6 days once. Pt states she has plantar fasciitis and previous foot surgery, limiting physical activity. Pt states she is in a lot of physical pain.   Pt states she likes to read as hobby. Pt states she has anxiety and does not like large crowds. Pt states she is a perfectionist and likes to follow-through on her tasks. Pt states she wants to have gastric lap band and that will help her get on the right track.   Pt expectations: to know what to eat and how to eat   Preferred Learning Style:   No preference indicated   Learning Readiness:   Contemplating  Ready  Change in progress   MEDICATIONS: See list   DIETARY INTAKE:  Usual eating pattern includes 2-3 meals and 0-3 snacks per day.  Everyday foods include chicken, pasta, fruit, muffins, chips.  Avoided foods include seafood, spicy foods, beef.    24-hr recall:  B ( AM): sometimes skips; 1-2 eggs, [place] bacon, toast  Snk ( AM): muffin or 2 fiber bars or 2 instant packs of oatmeal L ( PM): sometimes skips; [place] sandwich + chips Snk ( PM): watermelon sometimes with splenda or  nabs + diet soda or nuts D ( PM): chicken + pasta + cheese Snk ( PM): ice cream Beverages: coffee with splenda, water, diet soda, caffeine-free tea  Usual physical activity: limited  ability due to nerve damage in foot - walks dog 20 min, 4 days/week  Estimated energy needs: 1600 calories 180 g carbohydrates 120 g protein 44 g fat  Progress Towards Goal(s):  In progress.   Nutritional Diagnosis:  NI-5.8.5 Inadeqate fiber intake As related to less than optimal food-preparation practices.  As evidenced by reliance of overprocessed foods in diet recall.    Intervention:  Nutrition education and counseling. Pt was educated and counseled on the the importance of having well-balanced meals, eating consistently throughout her day, metabolism, and finding alternatives to eating when emotional.  Goals: - Aim to have 3 well-balanced meals a day.  - Make a list of non-food related activities as alternatives.   Teaching Method Utilized:  Visual Auditory Hands on  Handouts given during visit include:  My Plate  782 Things to do besides eat  Barriers to learning/adherence to lifestyle change: emotional eating, precontemplative stage of change  Demonstrated degree of understanding via:  Teach Back   Monitoring/Evaluation:  Dietary intake, exercise, and body weight prn.

## 2018-05-12 NOTE — Telephone Encounter (Signed)
Patient called back and she would like to reschedule to see one of the APP's at a sooner appointment. Scheduled to see APP on 6/6, cancelled appointment with Dr. Havery Moros for July.

## 2018-05-13 NOTE — Telephone Encounter (Signed)
Awesome. We can extend out to October 2019.   Salvatore Marvel, MD [facility]

## 2018-05-15 ENCOUNTER — Ambulatory Visit: Payer: Medicaid Other | Admitting: Allergy & Immunology

## 2018-05-15 ENCOUNTER — Encounter: Payer: Self-pay | Admitting: Physician Assistant

## 2018-05-15 ENCOUNTER — Ambulatory Visit: Payer: Medicaid Other | Admitting: Physician Assistant

## 2018-05-15 ENCOUNTER — Telehealth: Payer: Self-pay

## 2018-05-15 ENCOUNTER — Other Ambulatory Visit: Payer: Self-pay | Admitting: Physician Assistant

## 2018-05-15 VITALS — BP 110/64 | HR 82 | Ht 62.0 in | Wt 218.0 lb

## 2018-05-15 DIAGNOSIS — R1084 Generalized abdominal pain: Secondary | ICD-10-CM

## 2018-05-15 MED ORDER — HYOSCYAMINE SULFATE 0.125 MG SL SUBL: 0.1250 mg | SUBLINGUAL_TABLET | SUBLINGUAL | 0 refills | Status: DC | PRN

## 2018-05-15 NOTE — Progress Notes (Signed)
Agree with assessment and plan as outlined.  

## 2018-05-15 NOTE — Telephone Encounter (Signed)
Agree with the plan. Just have her come in next week for the injection. Also, we can set her up with [facility] for guidance.   [phone]  - option 1   Salvatore Marvel, MD [facility]

## 2018-05-15 NOTE — Progress Notes (Signed)
Chief Complaint: Abdominal pain  HPI:    Stephanie Serrano is a 52 year old female with a past medical history of reflux and others listed below, who follows with Dr. Havery Moros and presents to clinic today with a complaint of right upper quadrant pain.    08/26/2014 office visit to [facility] to discuss reflux.  At that time prescribed Pepcid 20 mg.  Trialed on 40 mg of Omeprazole before breakfast and prescribed Carafate as needed.    04/24/2018 colonoscopy Dr. Havery Moros with four 2-8 mm polyps in the cecum, one 4 mm polyp in the sigmoid colon, 1 5 mm polyp in the rectum, anal papillae were hypertrophied exam is otherwise normal.  Pathology mixture of sessile serrated and tubular adenomas.  Repeat recommended in 3 years.    04/30/2018 called describing right upper quadrant pain, especially after eating.  Labs are ordered including LFTs and right upper quadrant ultrasound.  Was recommended she increase omeprazole to twice daily.    05/06/2018 CBC with a hemoglobin slightly decreased at 11.7 (11.87 months ago), lipase normal, LFTs normal.  Right upper quadrant ultrasound 05/07/2018 showed no definite abnormality in the right upper quadrant.    05/09/2018 patient continued with pain especially with positional changes.  It was discussed this could be musculoskeletal as recommended she have an office visit.    Today, explains that this pain has been going on for 14 years.  It is somewhat more frequent now since she has been gaining weight, in November her 34 year old daughter died unexpectedly.  Her last child who is now 53 years old was a big baby over 9 pounds and this "split me from the inside out".  After giving birth she started with this pain which is mainly in her right upper quadrant and worse with positional changes, typically she bends over to grab something this feels as though something "gets stuck in there".  Then has to stretch out and rub this area in order to it for it to feel any better, this is  somewhat more frequent now over the past 8 months or so.  This pain is always positional and not related to eating or bowel movements.  Patient has mentioned it to her PCP but "they are not interested in working it up" per the patient.    Also describes pain from the area of her bellybutton up to her epigastrium which is "pinching and stinging".  Relates this to being "torn in my subcutaneous layer when pregnant".  Again this started hurting more after gaining weight.  This is "stinging".    Denies fever, chills, change in bowel habits, heartburn, reflux, nausea or vomiting.  Past Medical History:  Diagnosis Date  . Allergy   . Anemia   . Anxiety   . Arthritis   . Asthma   . Blood transfusion without reported diagnosis   . Depression   . Eczema   . Esophageal stricture   . GERD (gastroesophageal reflux disease)   . Migraines   . OCD (obsessive compulsive disorder)   . Plantar fasciitis of right foot   . RLS (restless legs syndrome)   . Vertigo     Past Surgical History:  Procedure Laterality Date  . ESOPHAGOGASTRODUODENOSCOPY N/A 02/09/2015   Procedure: ESOPHAGOGASTRODUODENOSCOPY (EGD);  Surgeon: Inda Castle, MD;  Location: [facility];  Service: Endoscopy;  Laterality: N/A;  with dilation  . FOOT SURGERY    . NASAL SINUS SURGERY    . PLANTAR FASCIA RELEASE Right 08/03/2016   Procedure:  PLANTAR FASCIA RELEASE WITH BONE SPUR EXCISION;  Surgeon: Dorna Leitz, MD;  Location: [facility];  Service: Orthopedics;  Laterality: Right;  . TONSILLECTOMY      Current Outpatient Medications  Medication Sig Dispense Refill  . albuterol (PROVENTIL HFA;VENTOLIN HFA) 108 (90 Base) MCG/ACT inhaler Inhale 2 puffs into the lungs every 6 (six) hours as needed for wheezing or shortness of breath.    . clonazePAM (KLONOPIN) 0.5 MG tablet Take 0.5 mg by mouth 2 (two) times daily.  0  . Dupilumab, Asthma, (DUPIXENT [place]) Inject into the skin.    [name] FLUoxetine (PROZAC) 20 MG capsule Take 3  capsules (60 mg total) by mouth daily. For depression 30 capsule 0  . hydrOXYzine (ATARAX/VISTARIL) 50 MG tablet Take 1 tablet (50 mg total) by mouth every 6 (six) hours as needed for anxiety. 60 tablet 0  . levocetirizine (XYZAL) 5 MG tablet Take 1 tablet (5 mg total) by mouth every evening. 30 tablet 5  . LINZESS 72 MCG capsule TAKE 1 CAPSULE BY MOUTH DAILY BEFORE BREAKFAST 30 capsule 0  . MECLIZINE HCL PO Take by mouth.    . OLANZapine (ZYPREXA) 10 MG tablet Take 10 mg by mouth at bedtime.    . ondansetron (ZOFRAN) 4 MG tablet Take 4 mg by mouth every 8 (eight) hours as needed for nausea or vomiting.    . polyethylene glycol (MIRALAX) packet Take 17 g by mouth daily. 14 each 0  . promethazine (PHENERGAN) 25 MG tablet Take 1 tablet (25 mg total) by mouth every 8 (eight) hours as needed. 20 tablet 0  . PULMICORT 0.5 MG/2ML nebulizer solution Use as directed for nasal irrigation twice daiy 120 mL 5  . traZODone (DESYREL) 50 MG tablet Take 1 tablet (50 mg total) by mouth at bedtime as needed for sleep. 30 tablet 0   Current Facility-Administered Medications  Medication Dose Route Frequency Provider Last Rate Last Dose  . 0.9 %  sodium chloride infusion  500 mL Intravenous Once Armbruster, Carlota Raspberry, MD        Allergies as of 05/15/2018 - Review Complete 05/15/2018  Allergen Reaction Noted  . Azithromycin  02/17/2015  . Clindamycin/lincomycin  03/22/2014  . Doxycycline  10/08/2017  . Penicillins  03/22/2014  . Sulfa antibiotics  03/22/2014  . Vicodin [hydrocodone-acetaminophen] Nausea Only 10/27/2015    Family History  Problem Relation Age of Onset  . Pancreatic cancer Father   . Allergic rhinitis Father   . Asthma Father   . Stomach cancer Father   . Diabetes Mother   . Heart disease Mother   . Allergic rhinitis Mother   . Anxiety disorder Brother   . Diabetes Paternal Grandmother   . Colon cancer Maternal Grandmother   . Cancer Other   . Esophageal cancer Neg Hx   . Eczema Neg  Hx   . Urticaria Neg Hx   . Liver cancer Neg Hx   . Rectal cancer Neg Hx     Social History   Socioeconomic History  . Marital status: Divorced    Spouse name: Not on file  . Number of children: 2  . Years of education: Not on file  . Highest education level: Not on file  Occupational History  . Occupation: Special educational needs teacher: [employer]  . Financial resource strain: Not on file  . Food insecurity:    Worry: Not on file    Inability: Not on file  . Transportation needs:  Medical: Not on file    Non-medical: Not on file  Tobacco Use  . Smoking status: Never Smoker  . Smokeless tobacco: Never Used  Substance and Sexual Activity  . Alcohol use: Yes    Comment: socially  . Drug use: No  . Sexual activity: Never  Lifestyle  . Physical activity:    Days per week: Not on file    Minutes per session: Not on file  . Stress: Not on file  Relationships  . Social connections:    Talks on phone: Not on file    Gets together: Not on file    Attends religious service: Not on file    Active member of club or organization: Not on file    Attends meetings of clubs or organizations: Not on file    Relationship status: Not on file  . Intimate partner violence:    Fear of current or ex partner: Not on file    Emotionally abused: Not on file    Physically abused: Not on file    Forced sexual activity: Not on file  Other Topics Concern  . Not on file  Social History Narrative  . Not on file    Review of Systems:    Constitutional: No weight loss, fever or chills Cardiovascular: No chest pain Respiratory: No SOB Gastrointestinal: See HPI and otherwise negative   Physical Exam:  Vital signs: Ht 5\' 2"  (1.575 m)   Wt 218 lb (98.9 kg)   LMP 01/17/2017   BMI 39.87 kg/m   Constitutional:   Pleasant obese Caucasian female appears to be in NAD, Well developed, Well nourished, alert and cooperative.  Respiratory: Respirations even and unlabored. Lungs clear to  auscultation bilaterally.   No wheezes, crackles, or rhonchi.  Cardiovascular: Normal S1, S2. No MRG. Regular rate and rhythm. No peripheral edema, cyanosis or pallor.  Gastrointestinal:  Soft, nondistended, RUQ ttp, midline ttp from epigastrum to umbilicus No rebound or guarding. Normal bowel sounds. No appreciable masses or hepatomegaly. Msk:  Symmetrical without gross deformities. Without edema, no deformity or joint abnormality. TTP on right lowest rib Psychiatric: Demonstrates good judgement and reason without abnormal affect or behaviors.  RELEVANT LABS AND IMAGING: CBC    Component Value Date/Time   WBC 6.7 05/06/2018 1431   RBC 3.95 05/06/2018 1431   HGB 11.7 (L) 05/06/2018 1431   HCT 34.9 (L) 05/06/2018 1431   PLT 253.0 05/06/2018 1431   MCV 88.4 05/06/2018 1431   MCH 29.9 10/02/2017 2126   MCHC 33.5 05/06/2018 1431   RDW 14.3 05/06/2018 1431   LYMPHSABS 2.2 05/06/2018 1431   MONOABS 0.6 05/06/2018 1431   EOSABS 0.6 05/06/2018 1431   BASOSABS 0.1 05/06/2018 1431    CMP     Component Value Date/Time   NA 139 10/02/2017 2126   K 3.8 10/02/2017 2126   CL 102 10/02/2017 2126   CO2 29 10/02/2017 2126   GLUCOSE 136 (H) 10/02/2017 2126   BUN 9 10/02/2017 2126   CREATININE 0.70 10/02/2017 2126   CREATININE 0.85 10/26/2016 0901   CALCIUM 8.7 (L) 10/02/2017 2126   PROT 6.3 05/06/2018 1431   ALBUMIN 3.6 05/06/2018 1431   AST 13 05/06/2018 1431   ALT 16 05/06/2018 1431   ALKPHOS 95 05/06/2018 1431   BILITOT 0.2 05/06/2018 1431   GFRNONAA >60 10/02/2017 2126   GFRNONAA 81 10/26/2016 0901   GFRAA >60 10/02/2017 2126   GFRAA >89 10/26/2016 0901   EXAM: ULTRASOUND ABDOMEN LIMITED RIGHT UPPER  QUADRANT  COMPARISON:  CT scan of April 03, 2006.  FINDINGS: Gallbladder:  No gallstones or wall thickening visualized. No sonographic Murphy sign noted by sonographer.  Common bile duct:  Diameter: 3.6 mm which is within normal limits.  Liver:  No focal lesion  identified. Within normal limits in parenchymal echogenicity. Portal vein is patent on color Doppler imaging with normal direction of blood flow towards the liver.  IMPRESSION: No definite abnormality seen in the right upper quadrant of the abdomen.   Electronically Signed   By: Marijo Conception, M.D.   On: 05/07/2018 11:48  Assessment: 1.  Abdominal pain: Discussed with patient that this pain does not sound GI related at all, likely this is musculoskeletal, worse with weight gain over the past 8 months; consider relation to described "tear"?  During pregnancy +/- costochondritis versus hernia  Plan: 1.  Scheduled patient for CT abdomen pelvis with contrast for further evaluation.  If this is negative/normal recommend patient see an abdominal wall pain specialist. 2.  Prescribed Hyoscyamine sulfate 0.125 mg as needed #15 with no refills.  Discussed with the patient this may not help her pain at all, but she can try to take one when she develops pain. Also discussed heating pads in the area vs salon pas pad. 3.  Patient to follow with Dr. Havery Moros in 1-2 months or sooner if necessary.  He can have a better discussion with her in regards to referral for abdominal wall injections versus other recommendations he may have.  Ellouise Newer, PA-C [facility] 05/15/2018, 3:34 PM  Cc: Azzie Glatter, FNP

## 2018-05-15 NOTE — Telephone Encounter (Signed)
Patient self administered Dupixent last Thursday. Today states that she has a half-dollar size rash around the injection site. She believes that she may not have gotten the angle or depth correct. I advised her that we can have her come in next week and administer again in office. Please advise on any further recommendations.

## 2018-05-15 NOTE — Patient Instructions (Signed)
If you are age 52 or older, your body mass index should be between 23-30. Your Body mass index is 39.87 kg/m. If this is out of the aforementioned range listed, please consider follow up with your Primary Care Provider.  If you are age 26 or younger, your body mass index should be between 19-25. Your Body mass index is 39.87 kg/m. If this is out of the aformentioned range listed, please consider follow up with your Primary Care Provider.   You have been scheduled for a CT scan of the abdomen and pelvis at [facility] ([address]---this is in the same building as [facility]).   You are scheduled on 05/28/18 at 1 pm. You should arrive 15 minutes prior to your appointment time for registration. Please follow the written instructions below on the day of your exam:  WARNING: IF YOU ARE ALLERGIC TO IODINE/X-RAY DYE, PLEASE NOTIFY RADIOLOGY IMMEDIATELY AT [phone]! YOU WILL BE GIVEN A 13 HOUR PREMEDICATION PREP.  1) Do not eat  after 9am (4 hours prior to your test) 2) You have been given 2 bottles of oral contrast to drink. The solution may taste better if refrigerated, but do NOT add ice or any other liquid to this solution. Shake well before drinking.    Drink 1 bottle of contrast @ 11 am (2 hours prior to your exam)  Drink 1 bottle of contrast @ 12 pm (1 hour prior to your exam)  You may take any medications as prescribed with a small amount of water except for the following: Metformin, Glucophage, Glucovance, Avandamet, Riomet, Fortamet, Actoplus Met, Janumet, Glumetza or Metaglip. The above medications must be held the day of the exam AND 48 hours after the exam.  The purpose of you drinking the oral contrast is to aid in the visualization of your intestinal tract. The contrast solution may cause some diarrhea. Before your exam is started, you will be given a small amount of fluid to drink. Depending on your individual set of symptoms, you may also receive an  intravenous injection of x-ray contrast/dye. Plan on being at [facility] for 30 minutes or longer, depending on the type of exam you are having performed.  This test typically takes 30-45 minutes to complete.  If you have any questions regarding your exam or if you need to reschedule, you may call the CT department at [phone] between the hours of 8:00 am and 5:00 pm, Monday-Friday.  ________________________________________________________________________  We have sent the following medications to your pharmacy for you to pick up at your convenience: Hyoscyamine  Follow up with Dr. Havery Moros on July 15, 2018 at 3:00 pm.  Thank you for choosing me and [facility].   Ellouise Newer, PA-C

## 2018-05-21 ENCOUNTER — Ambulatory Visit
Admission: RE | Admit: 2018-05-21 | Discharge: 2018-05-21 | Disposition: A | Discharge status: Home / Self Care | Payer: Medicaid Other | Source: Ambulatory Visit | Attending: Family Medicine | Admitting: Family Medicine

## 2018-05-21 DIAGNOSIS — Z1231 Encounter for screening mammogram for malignant neoplasm of breast: Secondary | ICD-10-CM

## 2018-05-22 ENCOUNTER — Ambulatory Visit (INDEPENDENT_AMBULATORY_CARE_PROVIDER_SITE_OTHER): Payer: Medicaid Other | Admitting: *Deleted

## 2018-05-22 DIAGNOSIS — J4520 Mild intermittent asthma, uncomplicated: Secondary | ICD-10-CM

## 2018-05-23 ENCOUNTER — Encounter: Payer: Self-pay | Admitting: Family Medicine

## 2018-05-23 NOTE — Progress Notes (Signed)
Received fax from [employer] regarding Cologuard testing. Order has expired after 1 year.

## 2018-05-27 ENCOUNTER — Telehealth: Payer: Self-pay

## 2018-05-27 NOTE — Telephone Encounter (Signed)
-----   Message from Azzie Glatter, FNP sent at 05/26/2018  4:17 PM EDT ----- Regarding: "Results of Mammogram" Mammogram is negative. She will need to follow up next year.

## 2018-05-27 NOTE — Telephone Encounter (Signed)
Left a vm for patient to callback 

## 2018-05-27 NOTE — Telephone Encounter (Signed)
-----   Message from Natalie M Stroud, FNP sent at 05/26/2018  4:17 PM EDT ----- Regarding: "Results of Mammogram" Mammogram is negative. She will need to follow up next year.  

## 2018-05-27 NOTE — Telephone Encounter (Signed)
Patient notified

## 2018-05-28 ENCOUNTER — Ambulatory Visit (INDEPENDENT_AMBULATORY_CARE_PROVIDER_SITE_OTHER)
Admission: RE | Admit: 2018-05-28 | Discharge: 2018-05-28 | Disposition: A | Discharge status: Home / Self Care | Payer: Medicaid Other | Source: Ambulatory Visit | Attending: Physician Assistant | Admitting: Physician Assistant

## 2018-05-28 DIAGNOSIS — R1084 Generalized abdominal pain: Secondary | ICD-10-CM

## 2018-05-28 MED ORDER — IOPAMIDOL (ISOVUE-300) INJECTION 61%
100.0000 mL | Freq: Once | INTRAVENOUS | Status: AC | PRN
  Administered 2018-05-28: 100 mL via INTRAVENOUS

## 2018-05-29 ENCOUNTER — Telehealth: Payer: Self-pay

## 2018-05-29 NOTE — Telephone Encounter (Signed)
Referral faxed to [facility] 

## 2018-06-10 ENCOUNTER — Other Ambulatory Visit: Payer: Self-pay | Admitting: Physician Assistant

## 2018-06-27 ENCOUNTER — Encounter

## 2018-06-27 ENCOUNTER — Ambulatory Visit: Payer: Self-pay | Admitting: Gastroenterology

## 2018-07-15 ENCOUNTER — Ambulatory Visit: Payer: Self-pay | Admitting: Gastroenterology

## 2018-07-15 ENCOUNTER — Telehealth: Payer: Self-pay

## 2018-07-15 NOTE — Telephone Encounter (Signed)
Patient states that she is having vertigo and would like a script sent in for Meclizine. Please advise

## 2018-07-18 ENCOUNTER — Telehealth: Payer: Self-pay | Admitting: Family Medicine

## 2018-07-20 ENCOUNTER — Other Ambulatory Visit: Payer: Self-pay | Admitting: Family Medicine

## 2018-07-20 DIAGNOSIS — R42 Dizziness and giddiness: Secondary | ICD-10-CM

## 2018-07-20 NOTE — Telephone Encounter (Signed)
Rx for Meclizine to pharmacy today.

## 2018-07-21 ENCOUNTER — Telehealth: Payer: Self-pay

## 2018-07-21 NOTE — Telephone Encounter (Signed)
Patient notified

## 2018-07-21 NOTE — Telephone Encounter (Signed)
Pharmacy didn't have script for Meclizine.

## 2018-07-21 NOTE — Telephone Encounter (Signed)
-----   Message from Azzie Glatter, [place] sent at 07/20/2018  3:02 PM EDT ----- Regarding: "Refill" Refill for Meclizine to pharmacy today. Remind her to keep follow up appointment 09/2018.  Thank you.

## 2018-07-22 ENCOUNTER — Other Ambulatory Visit: Payer: Self-pay | Admitting: Family Medicine

## 2018-07-22 DIAGNOSIS — R42 Dizziness and giddiness: Secondary | ICD-10-CM

## 2018-07-22 MED ORDER — MECLIZINE HCL 25 MG PO TABS: 25.0000 mg | ORAL_TABLET | Freq: Three times a day (TID) | ORAL | 3 refills | Status: DC | PRN

## 2018-07-22 NOTE — Telephone Encounter (Signed)
Rx to [facility] today.   Thanks

## 2018-07-22 NOTE — Progress Notes (Signed)
Rx for Meclizine to [facility] today.

## 2018-07-24 ENCOUNTER — Telehealth: Payer: Self-pay

## 2018-07-24 DIAGNOSIS — R42 Dizziness and giddiness: Secondary | ICD-10-CM

## 2018-07-24 MED ORDER — MECLIZINE HCL 12.5 MG PO TABS: 12.5000 mg | ORAL_TABLET | Freq: Three times a day (TID) | ORAL | 1 refills | Status: DC | PRN

## 2018-07-24 NOTE — Progress Notes (Signed)
Rx for Meclizine to pharmacy today.

## 2018-07-25 MED ORDER — MECLIZINE HCL 25 MG PO TABS: 25.0000 mg | ORAL_TABLET | Freq: Three times a day (TID) | ORAL | 1 refills | Status: DC | PRN

## 2018-07-25 NOTE — Telephone Encounter (Signed)
Medication sent to the [facility]

## 2018-08-13 ENCOUNTER — Ambulatory Visit (INDEPENDENT_AMBULATORY_CARE_PROVIDER_SITE_OTHER): Payer: Medicaid Other

## 2018-08-13 DIAGNOSIS — J4520 Mild intermittent asthma, uncomplicated: Secondary | ICD-10-CM

## 2018-08-13 DIAGNOSIS — L209 Atopic dermatitis, unspecified: Secondary | ICD-10-CM | POA: Diagnosis not present

## 2018-08-13 NOTE — Progress Notes (Signed)
Patient stated that she was giving the Dupixent injections herself however she had a hernia and ended up getting a mesh put in and now she is unable to give herself the injections and she stated she felt more comfortable coming to clinic for her injections. Patient will bring in her medication at appts.

## 2018-08-27 ENCOUNTER — Ambulatory Visit (INDEPENDENT_AMBULATORY_CARE_PROVIDER_SITE_OTHER): Payer: Medicaid Other | Admitting: *Deleted

## 2018-08-27 DIAGNOSIS — J4520 Mild intermittent asthma, uncomplicated: Secondary | ICD-10-CM

## 2018-08-27 DIAGNOSIS — L209 Atopic dermatitis, unspecified: Secondary | ICD-10-CM | POA: Diagnosis not present

## 2018-09-10 ENCOUNTER — Ambulatory Visit (INDEPENDENT_AMBULATORY_CARE_PROVIDER_SITE_OTHER): Payer: Medicaid Other | Admitting: *Deleted

## 2018-09-10 DIAGNOSIS — L209 Atopic dermatitis, unspecified: Secondary | ICD-10-CM

## 2018-09-11 ENCOUNTER — Ambulatory Visit: Payer: Self-pay | Admitting: Family Medicine

## 2018-09-12 ENCOUNTER — Ambulatory Visit: Payer: Medicaid Other | Admitting: Family Medicine

## 2018-09-12 ENCOUNTER — Encounter: Payer: Self-pay | Admitting: Family Medicine

## 2018-09-12 VITALS — BP 140/72 | HR 88 | Temp 97.9°F | Ht 62.0 in | Wt 199.8 lb

## 2018-09-12 DIAGNOSIS — Z124 Encounter for screening for malignant neoplasm of cervix: Secondary | ICD-10-CM

## 2018-09-12 DIAGNOSIS — F419 Anxiety disorder, unspecified: Secondary | ICD-10-CM | POA: Diagnosis not present

## 2018-09-12 DIAGNOSIS — Z131 Encounter for screening for diabetes mellitus: Secondary | ICD-10-CM

## 2018-09-12 DIAGNOSIS — Z9289 Personal history of other medical treatment: Secondary | ICD-10-CM

## 2018-09-12 DIAGNOSIS — Z09 Encounter for follow-up examination after completed treatment for conditions other than malignant neoplasm: Secondary | ICD-10-CM

## 2018-09-12 LAB — POCT GLYCOSYLATED HEMOGLOBIN (HGB A1C): Hemoglobin A1C: 5.4 % (ref 4.0–5.6)

## 2018-09-12 LAB — POCT URINALYSIS DIP (MANUAL ENTRY)
Bilirubin, UA: NEGATIVE
Blood, UA: NEGATIVE
Glucose, UA: NEGATIVE mg/dL
Ketones, POC UA: NEGATIVE mg/dL
Nitrite, UA: NEGATIVE
Protein Ur, POC: NEGATIVE mg/dL
Spec Grav, UA: 1.025 (ref 1.010–1.025)
Urobilinogen, UA: 0.2 E.U./dL
pH, UA: 6 (ref 5.0–8.0)

## 2018-09-12 NOTE — Progress Notes (Signed)
Pap Smear  Subjective:    Patient ID: Luda Charbonneau, female    DOB: [birth year], 52 y.o.   MRN: [id number]   Chief Complaint  Patient presents with  . Gynecologic Exam   HPI  Ms. Mastrianni is a 52 year old female with a past medical history of Vertigo, RLS, Plantar Fascitis of Right Foot, OCD, Migraines, GERD,Esophageal Stricture, Eczema, Depression, Asthma, Arthritis, Anxiety, Anemia, and Allergies. She is here for Pap Smear today.   Current Status: Patient here for routine gynecological exam.  She has previously had one Pap smear.  Patient states that she has never had an abnormal Pap smear. She states that she is not sexually active. She denies abnormal vaginal discharge, vaginal  itching, vaginal burning, or dyspareunia.  Patient states that she does not perform monthly self breast exams. She does not have any family history of cancer that she knows of.  She typically follows a balanced diet but does not exercise routinely. Body mass index is 36.54. She is having mild anxiety today concerning family issues.   Gynecologic History Patient's last bleeding episodes was on 01/2018. She states that she has not had 'normal' period before then. No discharge, discomfort, irregular bleeding, abdominal pain.  Review of Systems  Respiratory: Negative.   Cardiovascular: Negative.   Gastrointestinal: Negative.   Genitourinary: Negative.    Objective:   Physical Exam  Constitutional: She appears well-developed and well-nourished.  Cardiovascular: Normal rate, regular rhythm, normal heart sounds and intact distal pulses.  Pulmonary/Chest: Effort normal and breath sounds normal.  Abdominal: Soft. Bowel sounds are normal. She exhibits distension.  Psychiatric: Judgment and thought content normal.  Nursing note and vitals reviewed.  Assessment & Plan:   1. Pap smear for cervical cancer screening Tolerated procedure will with no complaints. Results are pending.   Follow-up for scheduled  mammogram Recommend monthly self breast exam Recommend daily multivitamin for women Recommend strength training in 150 minutes of cardiovascular exercise per week  2. Anxiety Mild anxiety today. She will continue Prozac as needed.   3. H/O mammogram She is current on Mammogram.   4. Screening for cervical cancer - Pap IG, CT/NG w/ reflex HPV when ASC-U [employer])  5. Screening for diabetes mellitus - POCT glycosylated hemoglobin (Hb A1C) - POCT urinalysis dipstick  6. Follow up She will follow up in 1 week for follow up of chronic issues.   No orders of the defined types were placed in this encounter.   Kathe Becton,  MSN, FNP-C [facility] [address] [place], [place] [address] [phone]

## 2018-09-18 ENCOUNTER — Telehealth: Payer: Self-pay

## 2018-09-18 LAB — PAP IG, CT-NG, RFX HPV ASCU
Chlamydia, Nuc. Acid Amp: NEGATIVE
Gonococcus by Nucleic Acid Amp: NEGATIVE
PAP Smear Comment: 0

## 2018-09-18 NOTE — Telephone Encounter (Signed)
Left a vm letting patient know that her appointment is at 120pm and to give [place] a call if the need to reschedule.

## 2018-09-19 ENCOUNTER — Ambulatory Visit (HOSPITAL_COMMUNITY)
Admission: RE | Admit: 2018-09-19 | Discharge: 2018-09-19 | Disposition: A | Discharge status: Home / Self Care | Payer: Medicaid Other | Source: Ambulatory Visit | Attending: Family Medicine | Admitting: Family Medicine

## 2018-09-19 ENCOUNTER — Encounter: Payer: Self-pay | Admitting: Family Medicine

## 2018-09-19 ENCOUNTER — Ambulatory Visit (INDEPENDENT_AMBULATORY_CARE_PROVIDER_SITE_OTHER): Payer: Medicaid Other | Admitting: Family Medicine

## 2018-09-19 VITALS — BP 106/72 | HR 92 | Temp 97.5°F | Ht 62.0 in | Wt 199.0 lb

## 2018-09-19 DIAGNOSIS — F331 Major depressive disorder, recurrent, moderate: Secondary | ICD-10-CM | POA: Diagnosis not present

## 2018-09-19 DIAGNOSIS — K5900 Constipation, unspecified: Secondary | ICD-10-CM | POA: Diagnosis not present

## 2018-09-19 DIAGNOSIS — M25512 Pain in left shoulder: Secondary | ICD-10-CM

## 2018-09-19 DIAGNOSIS — Z09 Encounter for follow-up examination after completed treatment for conditions other than malignant neoplasm: Secondary | ICD-10-CM

## 2018-09-19 DIAGNOSIS — F419 Anxiety disorder, unspecified: Secondary | ICD-10-CM | POA: Diagnosis not present

## 2018-09-19 DIAGNOSIS — G4700 Insomnia, unspecified: Secondary | ICD-10-CM

## 2018-09-19 NOTE — Progress Notes (Signed)
Follow Up  Subjective:    Patient ID: Stephanie Serrano, female    DOB: 01-18-1966, 52 y.o.   MRN: [id number]   Chief Complaint  Patient presents with  . Follow-up    1 week on chronic condition   HPI  Ms. Self is a 52 year old female with a past medical history of Vertigo, RLS, Plantar Fascitis of Right Foot, OCD, Migraines, GERD, Esophageal Stricture, Eczema, Depression, Asthma, Arthritis, Anxiety, Anemia, and Allergy. She is here today for follow up.   Current Status: Since her last office visit, she reports pain in her left arm since a fall, approximately in mid August, 3 weeks after her Hernia surgery, when she experienced dizziness. She takes Acetaminophen for relief. She states that pain is not improved and her pain seems to have worsened.  She has increased anxiety, with teenage daughter and the recent loss of her 50 year old daughter. She continues to use Dupixent Injections for Eczema, twice monthly. She sees Dr. Melina Copa at [facility] every 4 weeks for Anxiety and Depression. She is unable to sleep because of increased anxiety. She is very emotional, anxious, and tearful today.   She denies fevers, chills, fatigue, recent infections, weight loss, and night sweats. She has not had any headaches, visual changes, dizziness, and falls. No chest pain, heart palpitations, cough and shortness of breath reported. No reports of GI problems such as nausea, vomiting, diarrhea, and constipation. She has no reports of blood in stools, dysuria and hematuria. She denies pain today.   Past Medical History:  Diagnosis Date  . Allergy   . Anemia   . Anxiety   . Arthritis   . Asthma   . Blood transfusion without reported diagnosis   . Depression   . Eczema   . Esophageal stricture   . GERD (gastroesophageal reflux disease)   . Migraines   . OCD (obsessive compulsive disorder)   . Plantar fasciitis of right foot   . RLS (restless legs syndrome)   . Vertigo     Family History  Problem  Relation Age of Onset  . Pancreatic cancer Father   . Allergic rhinitis Father   . Asthma Father   . Stomach cancer Father   . Diabetes Mother   . Heart disease Mother   . Allergic rhinitis Mother   . Anxiety disorder Brother   . Diabetes Paternal Grandmother   . Colon cancer Maternal Grandmother   . Cancer Other   . Esophageal cancer Neg Hx   . Eczema Neg Hx   . Urticaria Neg Hx   . Liver cancer Neg Hx   . Rectal cancer Neg Hx     Social History   Socioeconomic History  . Marital status: Divorced    Spouse name: Not on file  . Number of children: 2  . Years of education: Not on file  . Highest education level: Not on file  Occupational History  . Occupation: Special educational needs teacher: [employer]  . Financial resource strain: Not on file  . Food insecurity:    Worry: Not on file    Inability: Not on file  . Transportation needs:    Medical: Not on file    Non-medical: Not on file  Tobacco Use  . Smoking status: Never Smoker  . Smokeless tobacco: Never Used  Substance and Sexual Activity  . Alcohol use: Yes    Comment: socially  . Drug use: No  . Sexual activity: Never  Lifestyle  . Physical activity:    Days per week: Not on file    Minutes per session: Not on file  . Stress: Not on file  Relationships  . Social connections:    Talks on phone: Not on file    Gets together: Not on file    Attends religious service: Not on file    Active member of club or organization: Not on file    Attends meetings of clubs or organizations: Not on file    Relationship status: Not on file  . Intimate partner violence:    Fear of current or ex partner: Not on file    Emotionally abused: Not on file    Physically abused: Not on file    Forced sexual activity: Not on file  Other Topics Concern  . Not on file  Social History Narrative  . Not on file    Past Surgical History:  Procedure Laterality Date  . ESOPHAGOGASTRODUODENOSCOPY N/A 02/09/2015   Procedure:  ESOPHAGOGASTRODUODENOSCOPY (EGD);  Surgeon: Inda Castle, MD;  Location: [facility];  Service: Endoscopy;  Laterality: N/A;  with dilation  . FOOT SURGERY    . NASAL SINUS SURGERY    . PLANTAR FASCIA RELEASE Right 08/03/2016   Procedure: PLANTAR FASCIA RELEASE WITH BONE SPUR EXCISION;  Surgeon: Dorna Leitz, MD;  Location: [facility];  Service: Orthopedics;  Laterality: Right;  . TONSILLECTOMY      Immunization History  Administered Date(s) Administered  . Influenza,inj,Quad PF,6+ Mos 08/22/2015, 10/19/2016  . Pneumococcal Polysaccharide-23 03/12/2018  . Tdap 10/19/2016   Current Meds  Medication Sig  . albuterol (PROVENTIL HFA;VENTOLIN HFA) 108 (90 Base) MCG/ACT inhaler Inhale 2 puffs into the lungs every 6 (six) hours as needed for wheezing or shortness of breath.  . citalopram (CELEXA) 10 MG tablet Take 10 mg by mouth daily.  . clonazePAM (KLONOPIN) 0.5 MG tablet Take 0.5 mg by mouth 2 (two) times daily.  . Dupilumab, Asthma, (DUPIXENT [place]) Inject into the skin.  [name] FLUoxetine (PROZAC) 20 MG capsule Take 3 capsules (60 mg total) by mouth daily. For depression (Patient taking differently: Take 40 mg by mouth daily. For depression)  . hyoscyamine (LEVSIN SL) 0.125 MG SL tablet PLACE 1 TABLET UNDER THE TONGUE AS NEEDED FOR PAIN  . levocetirizine (XYZAL) 5 MG tablet Take 1 tablet (5 mg total) by mouth every evening.  [name] LINZESS 72 MCG capsule TAKE 1 CAPSULE BY MOUTH DAILY BEFORE BREAKFAST  . meclizine (ANTIVERT) 25 MG tablet Take 1 tablet (25 mg total) by mouth 3 (three) times daily as needed for dizziness.  . ondansetron (ZOFRAN) 4 MG tablet Take 4 mg by mouth every 8 (eight) hours as needed for nausea or vomiting.  . polyethylene glycol (MIRALAX) packet Take 17 g by mouth daily.  [name] PULMICORT 0.5 MG/2ML nebulizer solution Use as directed for nasal irrigation twice daiy  . traZODone (DESYREL) 50 MG tablet Take 1 tablet (50 mg total) by mouth at bedtime as needed for sleep.     Current Facility-Administered Medications for the 09/19/18 encounter (Office Visit) with Azzie Glatter, FNP  Medication  . 0.9 %  sodium chloride infusion    Allergies  Allergen Reactions  . Azithromycin   . Clindamycin/Lincomycin   . Doxycycline     Swelling of mucous membrane, body pain  . Penicillins     Has patient had a PCN reaction causing immediate rash, facial/tongue/throat swelling, SOB or lightheadedness with hypotension: yes Has patient had  a PCN reaction causing severe rash involving mucus membranes or skin necrosis: no Has patient had a PCN reaction that required hospitalization: no Has patient had a PCN reaction occurring within the last 10 years: no If all of the above answers are "NO", then may proceed with Cephalosporin use.   . Sulfa Antibiotics   . Sulfasalazine Other (See Comments)  . Vicodin [Hydrocodone-Acetaminophen] Nausea Only    BP 106/72 (BP Location: Left Arm, Patient Position: Sitting, Cuff Size: Large)   Pulse 92   Temp (!) 97.5 F (36.4 C) (Oral)   Ht 5\' 2"  (1.575 m)   Wt 199 lb (90.3 kg)   LMP 01/17/2017   SpO2 98%   BMI 36.40 kg/m   Review of Systems  Constitutional: Negative.   HENT: Negative.   Respiratory: Negative.   Gastrointestinal: Positive for constipation.  Genitourinary: Negative.   Neurological: Negative.   Psychiatric/Behavioral: The patient is nervous/anxious.        Increased grieve.   Objective:   Physical Exam  Constitutional: She appears well-developed and well-nourished.  HENT:  Head: Normocephalic and atraumatic.  Neck: Normal range of motion. Neck supple.  Cardiovascular: Normal rate, regular rhythm, normal heart sounds and intact distal pulses.  Pulmonary/Chest: Effort normal and breath sounds normal.  Abdominal: Soft. Bowel sounds are normal. She exhibits distension (Obese).  Musculoskeletal: She exhibits edema and tenderness.       Arms: Limited ROM in left shoulder.   Neurological: She is alert.   Skin: Skin is warm and dry. Capillary refill takes less than 2 seconds.  Psychiatric: She has a normal mood and affect. Her behavior is normal. Judgment and thought content normal.  Nursing note and vitals reviewed.  Assessment & Plan:   1. Acute pain of left shoulder - DG Shoulder Left; Future  2. MDD (major depressive disorder), recurrent episode, moderate ([place])  3. Anxiety Stable. Continue antipsychotics medications as prescribe.   4. Constipation, unspecified constipation type Continue Linzess as prescribed.   5. Insomnia, unspecified type Continue Trazodone for sleep as prescribed.   6. Follow up He will follow up in 6 months.   No orders of the defined types were placed in this encounter.  Kathe Becton,  MSN, FNP-C [facility] [address] [place], [place] [address] [phone]

## 2018-09-24 ENCOUNTER — Ambulatory Visit (INDEPENDENT_AMBULATORY_CARE_PROVIDER_SITE_OTHER): Payer: Medicaid Other | Admitting: *Deleted

## 2018-09-24 DIAGNOSIS — L209 Atopic dermatitis, unspecified: Secondary | ICD-10-CM

## 2018-09-29 ENCOUNTER — Telehealth: Payer: Self-pay

## 2018-09-30 NOTE — Telephone Encounter (Signed)
Patient states that she is still having shoulder pain and wants to know if she can have a MRI since the xray didn't show anything.

## 2018-10-10 NOTE — Telephone Encounter (Signed)
Patient called again regarding having a MRI.

## 2018-10-15 ENCOUNTER — Ambulatory Visit: Payer: Medicaid Other | Admitting: Allergy & Immunology

## 2018-10-15 ENCOUNTER — Encounter: Payer: Self-pay | Admitting: Allergy & Immunology

## 2018-10-15 VITALS — BP 102/62 | HR 83 | Resp 18 | Ht 62.0 in

## 2018-10-15 DIAGNOSIS — L301 Dyshidrosis [pompholyx]: Secondary | ICD-10-CM | POA: Diagnosis not present

## 2018-10-15 DIAGNOSIS — J302 Other seasonal allergic rhinitis: Secondary | ICD-10-CM | POA: Diagnosis not present

## 2018-10-15 DIAGNOSIS — J4520 Mild intermittent asthma, uncomplicated: Secondary | ICD-10-CM

## 2018-10-15 DIAGNOSIS — J3089 Other allergic rhinitis: Secondary | ICD-10-CM | POA: Diagnosis not present

## 2018-10-15 NOTE — Telephone Encounter (Signed)
Patient returned call about xray and shoulder pain and also states that she had a fall and think she broke her nose. Patient was advise to go to urgent care to be seen as we don't have any slots for today.

## 2018-10-15 NOTE — Patient Instructions (Addendum)
1. Mild intermittent asthma, uncomplicated - Lung function looked fairly good today.  - It does not seem that you need a controller medication for your asthma this time. - Continue with albuterol 2-4 puffs every 4-6 hours as needed.   2. Seasonal and perennial allergic rhinitis (trees, grasses, outdoor molds, dust mites, cat and dog) - Continue taking: budeonside nasal rinses twice daily and Xyzal (levocetirizine) 5mg  tablet once daily - You can use an extra dose of the antihistamine, if needed, for breakthrough symptoms.  - Consider nasal saline rinses 1-2 times daily to remove allergens from the nasal cavities as well as help with mucous clearance (this is especially helpful to do before the nasal sprays are given) - Consider allergy shots as a means of long-term control.  3. Dyshidrotic eczema - controlled with Dupixent every two weeks - Continue with the Dupixent every two weeks.  - We will get to work on getting it approved once again.  4. Return in about 6 months (around 04/15/2019).   Please inform us of any Emergency Department visits, hospitalizations, or changes in symptoms. Call us before going to the ED for breathing or allergy symptoms since we might be able to fit you in for a sick visit. Feel free to contact us anytime with any questions, problems, or concerns.  It was a pleasure to see you again today!  Websites that have reliable patient information: 1. American Academy of Asthma, Allergy, and Immunology: www.aaaai.org 2. Food Allergy Research and Education (FARE): foodallergy.org 3. Mothers of Asthmatics: http://www.asthmacommunitynetwork.org 4. American College of Allergy, Asthma, and Immunology: MonthlyElectricBill.co.uk   Make sure you are registered to vote! If you have moved or changed any of your contact information, you will need to get this updated before voting!

## 2018-10-15 NOTE — Progress Notes (Signed)
FOLLOW UP  Date of Service/Encounter:  10/15/18   Assessment:   Mild intermittent asthma, uncomplicated  Seasonal and perennial allergic rhinitis (trees, grasses, outdoor molds, dust mites, cat and dog)  Dyshidrotic eczema - controlled with Dupixent  Recent nasal trauma   Stephanie Serrano is doing very well on the Dupixent every two weeks. This has helped her eczema, nasal polyposis as well as her breathing. We will continue with this since her improvement has been so dramatic. We did discuss side effects, but as she has tolerated it for several months without a problem, I do not anticipate that this is going to be an issue. I did recommend that she call Dr. Janace Serrano to get a follow up appointment for evaluation of her nasal trauma. Overall it is difficult to get a sense of what is going on in there given all of the edema from her recent fall.   Plan/Recommendations:   1. Mild intermittent asthma, uncomplicated - Lung function looked fairly good today.  - It does not seem that you need a controller medication for your asthma this time. - Continue with albuterol 2-4 puffs every 4-6 hours as needed.   2. Seasonal and perennial allergic rhinitis (trees, grasses, outdoor molds, dust mites, cat and dog) - Continue taking: budeonside nasal rinses twice daily and Xyzal (levocetirizine) 5mg  tablet once daily - You can use an extra dose of the antihistamine, if needed, for breakthrough symptoms.  - Consider nasal saline rinses 1-2 times daily to remove allergens from the nasal cavities as well as help with mucous clearance (this is especially helpful to do before the nasal sprays are given) - Consider allergy shots as a means of long-term control.  3. Dyshidrotic eczema - controlled with Dupixent every two weeks - Continue with the Dupixent every two weeks.  - We will get to work on getting it approved once again.  4. Return in about 6 months (around 04/15/2019).   Subjective:   Stephanie Serrano Serrano is  a 52 y.o. female presenting today for follow up of No chief complaint on file.   Stephanie Serrano Serrano has a history of the following: Patient Active Problem List   Diagnosis Date Noted  . MDD (major depressive disorder), recurrent episode, moderate (Stephanie Serrano) 06/28/2017  . OCD (obsessive compulsive disorder) 03/26/2017  . Obstructive sleep apnea of adult 01/18/2017  . Insomnia 02/08/2016  . Heel spur 09/01/2015  . Family history of cystitis 08/31/2015  . Left foot pain 08/31/2015  . Prediabetes 08/23/2015  . Hyperlipidemia 08/23/2015  . Depression 03/21/2015  . Acute esophagitis 02/09/2015  . Gastroesophageal reflux disease without esophagitis 08/27/2014  . Dysphagia, unspecified(787.20) 03/22/2014    History obtained from: chart review and patient.  Stephanie Serrano Serrano Primary Care Provider is Stephanie Glatter, FNP.     [name] is a 52 y.o. female presenting for a follow up visit.  She was last seen as a new patient in April 2019.  At that time, her lung function looked good and we did not start her on a controller medication for her asthma.  I felt that her breathing was more related to aspirin exacerbated respiratory disease, so I did provide some information on a AERD.  She had testing that was positive to trees, grasses, outdoor molds, dust mite, cat, and dog.  We started her on budesonide rinses twice daily and Xyzal 5 mg once daily.  I also diagnosed her with dyshidrotic eczema.  We continued with clobetasol and added on Eucrisa twice daily as needed  to the worst areas.  We also discussed starting to pick sent in the future if needed.  In the interim, she did make the decision to start Stephanie Serrano Serrano every 2 weeks.  She receives her injections here in the clinic. This decision was made followed a phone call at the end of April at which time she was reporting no improvement with her Stephanie Serrano Serrano as well as her multiple topical steroids.   Since the last visit, she has done well. She feels that the [place]  has done wonders for her skin. She is very happy with how well she is doing with that. She was giving herself injections at home and she attempted 3-4 times. She does not feel comfortable doing it, especially after having hernia surgery with the mesh on her abdomen. Therefore she has been coming in for the injections. Skin improvement has been phenomenal. She has even noticed an improvement with her breathing as well as her nasal congestion. She does have a history of polyps and tells me today that her nasal obstruction has improved. She denies any side effects, including conjunctivitis, headaches, pharyngitis, or local reactions.   Asthma has bene controlled without the use of rescue inhalers. Stephanie Serrano's asthma has been well controlled. She has not required rescue medication, experienced nocturnal awakenings due to lower respiratory symptoms, nor have activities of daily living been limited. She has required no Emergency Department or Urgent Care visits for her asthma. She has required zero courses of systemic steroids for asthma exacerbations since the last visit. ACT score today is 25, indicating excellent asthma symptom control.   On another note, she did fall yesterday when chasing her dog. She hit her nose on the ground and she had bleeding for several minutes before she was able to resolve this with compression. She is worried that she might have broken her nose.  Otherwise, there have been no changes to her past medical history, surgical history, family history, or social history.    Review of Systems: a 14-point review of systems is pertinent for what is mentioned in HPI.  Otherwise, all other systems were negative.  Constitutional: negative other than that listed in the HPI Eyes: negative other than that listed in the HPI Ears, nose, mouth, throat, and face: negative other than that listed in the HPI Respiratory: negative other than that listed in the HPI Cardiovascular: negative other than  that listed in the HPI Gastrointestinal: negative other than that listed in the HPI Genitourinary: negative other than that listed in the HPI Integument: negative other than that listed in the HPI Hematologic: negative other than that listed in the HPI Musculoskeletal: negative other than that listed in the HPI Neurological: negative other than that listed in the HPI Allergy/Immunologic: negative other than that listed in the HPI    Objective:   Blood pressure 102/62, pulse 83, resp. rate 18, height 5\' 2"  (1.575 m), last menstrual period 01/17/2017, SpO2 97 %. Body mass index is 36.4 kg/m.   Physical Exam:  General: Alert, interactive, in no acute distress. Eyes: No conjunctival injection bilaterally, no discharge on the right, no discharge on the left, no Horner-Trantas dots present and allergic shiners present bilaterally. PERRL bilaterally. EOMI without pain. No photophobia.  Ears: Right TM pearly gray with normal light reflex, Left TM pearly gray with normal light reflex, Right TM intact without perforation and Left TM intact without perforation.  Nose/Throat: Septal deviation to the right (noted on previous exams) with overall turbinate hypertrophy and dried blood  and nasal crease present. Turbinates markedly edematous without discharge. Posterior oropharynx erythematous without cobblestoning in the posterior oropharynx. Tonsils 2+ without exudates.  Tongue without thrush. Nasal polyp present on the right side.  Lungs: Clear to auscultation without wheezing, rhonchi or rales. No increased work of breathing. CV: Normal S1/S2. No murmurs. Capillary refill <2 seconds.  Skin: Warm and dry, without lesions or rashes. Neuro:   Grossly intact. No focal deficits appreciated. Responsive to questions.  Diagnostic studies:   Spirometry: results normal (FEV1: 1.98/87%, FVC: 2.30/76%, FEV1/FVC: 86%).    Spirometry consistent with normal pattern.   Allergy Studies: none     Salvatore Marvel, MD  [facility]

## 2018-10-25 ENCOUNTER — Other Ambulatory Visit: Payer: Self-pay | Admitting: Allergy & Immunology

## 2018-10-28 ENCOUNTER — Other Ambulatory Visit: Payer: Self-pay | Admitting: Family Medicine

## 2018-10-28 DIAGNOSIS — M25512 Pain in left shoulder: Principal | ICD-10-CM

## 2018-10-28 DIAGNOSIS — G8929 Other chronic pain: Secondary | ICD-10-CM

## 2018-10-28 NOTE — Telephone Encounter (Signed)
Spoke to patient today. We have referred her to Orthopedics. Thanks.

## 2018-10-28 NOTE — Progress Notes (Signed)
Referral sent to Orthopedic Surgery today.

## 2019-02-02 ENCOUNTER — Telehealth: Payer: Self-pay

## 2019-02-02 ENCOUNTER — Encounter: Payer: Self-pay | Admitting: Family Medicine

## 2019-02-02 ENCOUNTER — Ambulatory Visit (HOSPITAL_COMMUNITY)
Admission: RE | Admit: 2019-02-02 | Discharge: 2019-02-02 | Disposition: A | Discharge status: Home / Self Care | Payer: Medicaid Other | Source: Ambulatory Visit | Attending: Family Medicine | Admitting: Family Medicine

## 2019-02-02 ENCOUNTER — Ambulatory Visit (INDEPENDENT_AMBULATORY_CARE_PROVIDER_SITE_OTHER): Payer: Medicaid Other | Admitting: Family Medicine

## 2019-02-02 VITALS — BP 110/72 | HR 90 | Ht 62.0 in | Wt 185.0 lb

## 2019-02-02 DIAGNOSIS — R05 Cough: Secondary | ICD-10-CM

## 2019-02-02 DIAGNOSIS — R6889 Other general symptoms and signs: Secondary | ICD-10-CM | POA: Diagnosis not present

## 2019-02-02 DIAGNOSIS — F419 Anxiety disorder, unspecified: Secondary | ICD-10-CM

## 2019-02-02 DIAGNOSIS — R42 Dizziness and giddiness: Secondary | ICD-10-CM | POA: Diagnosis not present

## 2019-02-02 DIAGNOSIS — S82891A Other fracture of right lower leg, initial encounter for closed fracture: Secondary | ICD-10-CM

## 2019-02-02 DIAGNOSIS — Z09 Encounter for follow-up examination after completed treatment for conditions other than malignant neoplasm: Secondary | ICD-10-CM

## 2019-02-02 DIAGNOSIS — K5900 Constipation, unspecified: Secondary | ICD-10-CM

## 2019-02-02 DIAGNOSIS — R059 Cough, unspecified: Secondary | ICD-10-CM

## 2019-02-02 DIAGNOSIS — S93401A Sprain of unspecified ligament of right ankle, initial encounter: Secondary | ICD-10-CM

## 2019-02-02 LAB — POCT INFLUENZA A/B
Influenza A, POC: NEGATIVE
Influenza B, POC: NEGATIVE

## 2019-02-02 MED ORDER — GUAIFENESIN 100 MG/5ML PO SOLN: 5.0000 mL | ORAL | 0 refills | Status: DC | PRN

## 2019-02-02 MED ORDER — POLYETHYLENE GLYCOL 3350 17 G PO PACK: 17.0000 g | PACK | Freq: Every day | ORAL | 3 refills | Status: AC

## 2019-02-02 MED ORDER — MECLIZINE HCL 25 MG PO TABS: 25.0000 mg | ORAL_TABLET | Freq: Three times a day (TID) | ORAL | 3 refills | Status: DC | PRN

## 2019-02-02 NOTE — Progress Notes (Signed)
Patient Stephanie Serrano [facility]  Sick Visit  Subjective:  Patient ID: Stephanie Serrano, female    DOB: January 08, 1966  Age: 53 y.o. MRN: [id number]  CC:  Chief Complaint  Patient presents with  . Cough    6 weeks  . Nasal Congestion  . Wheezing  . Fall    ankle injuy  . Chills    HPI Stephanie Serrano  Is a 53 year old female who presents for Sick Visit today.   Past Medical History:  Diagnosis Date  . Allergy   . Anemia   . Anxiety   . Arthritis   . Asthma   . Blood transfusion without reported diagnosis   . Depression   . Eczema   . Esophageal stricture   . GERD (gastroesophageal reflux disease)   . Migraines   . OCD (obsessive compulsive disorder)   . Plantar fasciitis of right foot   . RLS (restless legs syndrome)   . Vertigo    Current Status: Since her last office visit, she reports that she has been having fevers, chills, cough, and fatigue X 7 days. Her symptoms have improved since then. She does report recent diarrhea. Her anxiety is mild today. She denies suicidal ideations, homicidal ideations, or auditory hallucinations. She continues to see Psychiatrist at [facility]. She had mild pain in right ankle today.   She denies fevers, chills, fatigue, recent infections, weight loss, and night sweats. She has not had any headaches, visual changes, dizziness, and falls. No chest pain, heart palpitations, and shortness of breath reported. No reports of GI problems such as nausea, vomiting, diarrhea, and constipation. She has no reports of blood in stools, dysuria and hematuria.   Past Surgical History:  Procedure Laterality Date  . ESOPHAGOGASTRODUODENOSCOPY N/A 02/09/2015   Procedure: ESOPHAGOGASTRODUODENOSCOPY (EGD);  Surgeon: Inda Castle, MD;  Location: [facility];  Service: Endoscopy;  Laterality: N/A;  with dilation  . FOOT SURGERY    . NASAL SINUS SURGERY    . PLANTAR FASCIA RELEASE Right 08/03/2016   Procedure: PLANTAR FASCIA RELEASE WITH BONE SPUR EXCISION;  Surgeon: Dorna Leitz, MD;  Location: [facility];  Service: Orthopedics;  Laterality: Right;  . TONSILLECTOMY      Family History  Problem Relation Age of Onset  . Pancreatic cancer Father   . Allergic rhinitis Father   . Asthma Father   . Stomach cancer Father   . Diabetes Mother   . Heart disease Mother   . Allergic rhinitis Mother   . Anxiety disorder Brother   . Diabetes Paternal Grandmother   . Colon cancer Maternal Grandmother   . Cancer Other   . Esophageal cancer Neg Hx   . Eczema Neg Hx   . Urticaria Neg Hx   . Liver cancer Neg Hx   . Rectal cancer Neg Hx     Social History   Socioeconomic History  . Marital status: Divorced    Spouse name: Not on file  . Number of children: 2  . Years of education: Not on file  . Highest education level: Not on file  Occupational History  . Occupation: Special educational needs teacher: [employer]  . Financial resource strain: Not on file  . Food insecurity:    Worry: Not on file    Inability: Not on file  . Transportation needs:    Medical: Not on file    Non-medical: Not on file  Tobacco Use  . Smoking status: Never Smoker  . Smokeless tobacco: Never Used  Substance and Sexual Activity  . Alcohol use: Yes    Comment: socially  . Drug use: No  . Sexual activity: Never  Lifestyle  . Physical activity:    Days per week: Not on file    Minutes per session: Not on file  . Stress: Not on file  Relationships  . Social connections:    Talks on phone: Not on file    Gets together: Not on file    Attends religious service: Not on file    Active member of club or organization: Not on file    Attends meetings of clubs or organizations: Not on file    Relationship status: Not on file  . Intimate partner violence:    Fear of current or ex partner: Not on file    Emotionally abused: Not on file    Physically abused: Not on file    Forced sexual  activity: Not on file  Other Topics Concern  . Not on file  Social History Narrative  . Not on file    Outpatient Medications Prior to Visit  Medication Sig Dispense Refill  . albuterol (PROVENTIL HFA;VENTOLIN HFA) 108 (90 Base) MCG/ACT inhaler Inhale 2 puffs into the lungs every 6 (six) hours as needed for wheezing or shortness of breath.    . clonazePAM (KLONOPIN) 1 MG tablet Take 1 mg by mouth 2 (two) times daily.  0  . Dupilumab, Asthma, (DUPIXENT [employer]) Inject into the skin.    Stephanie Serrano eszopiclone (LUNESTA) 2 MG TABS tablet Take 2 mg by mouth at bedtime.    Stephanie Serrano FLUoxetine (PROZAC) 40 MG capsule Take 40 mg by mouth.    . levocetirizine (XYZAL) 5 MG tablet TAKE 1 TABLET BY MOUTH EVERY EVENING 30 tablet 5  . LINZESS 72 MCG capsule TAKE 1 CAPSULE BY MOUTH DAILY BEFORE BREAKFAST 30 capsule 0  . ondansetron (ZOFRAN) 4 MG tablet Take 4 mg by mouth every 8 (eight) hours as needed for nausea or vomiting.    Stephanie Serrano PULMICORT 0.5 MG/2ML nebulizer solution Use as directed for nasal irrigation twice daiy 120 mL 5  . traZODone (DESYREL) 50 MG tablet Take 1 tablet (50 mg total) by mouth at bedtime as needed for sleep. 30 tablet 0  . citalopram (CELEXA) 10 MG tablet Take 10 mg by mouth daily.    Stephanie Serrano FLUoxetine (PROZAC) 20 MG capsule Take 3 capsules (60 mg total) by mouth daily. For depression (Patient taking differently: Take 40 mg by mouth daily. For depression) 30 capsule 0  . hyoscyamine (LEVSIN SL) 0.125 MG SL tablet PLACE 1 TABLET UNDER THE TONGUE AS NEEDED FOR PAIN 90 tablet 0  . meclizine (ANTIVERT) 25 MG tablet Take 1 tablet (25 mg total) by mouth 3 (three) times daily as needed for dizziness. 30 tablet 1  . polyethylene glycol (MIRALAX) packet Take 17 g by mouth daily. 14 each 0  . clonazePAM (KLONOPIN) 0.5 MG tablet Take 0.5 mg by mouth 2 (two) times daily.  0   Facility-Administered Medications Prior to Visit  Medication Dose Route Frequency Provider Last Rate Last Dose  . 0.9 %  sodium chloride infusion   500 mL Intravenous Once Armbruster, Carlota Raspberry, MD        Allergies  Allergen Reactions  . Azithromycin   . Clindamycin/Lincomycin   . Doxycycline     Swelling of mucous membrane, body pain  . Penicillins  Has patient had a PCN reaction causing immediate rash, facial/tongue/throat swelling, SOB or lightheadedness with hypotension: yes Has patient had a PCN reaction causing severe rash involving mucus membranes or skin necrosis: no Has patient had a PCN reaction that required hospitalization: no Has patient had a PCN reaction occurring within the last 10 years: no If all of the above answers are "NO", then may proceed with Cephalosporin use.   . Sulfa Antibiotics   . Sulfasalazine Other (See Comments)  . Vicodin [Hydrocodone-Acetaminophen] Nausea Only    ROS Review of Systems  Constitutional: Positive for chills, fatigue and fever.  HENT: Negative.   Eyes: Negative.   Respiratory: Positive for cough and shortness of breath.   Cardiovascular: Negative.   Gastrointestinal: Negative.   Endocrine: Negative.   Genitourinary: Negative.   Musculoskeletal: Positive for arthralgias (right ankle pain) and joint swelling (pain, redness, swelling in right ankle ).  Skin: Negative.   Allergic/Immunologic: Negative.   Neurological: Negative.   Hematological: Negative.   Psychiatric/Behavioral: Negative.    Objective:    Physical Exam  Constitutional: She is oriented to person, place, and time. She appears well-developed.  Eyes: Conjunctivae are normal.  Neck: Normal range of motion. Neck supple.  Cardiovascular: Normal rate, regular rhythm, normal heart sounds and intact distal pulses.  Pulmonary/Chest: Effort normal and breath sounds normal.  Abdominal: Soft. Bowel sounds are normal.  Musculoskeletal:        General: Tenderness and edema present.     Comments: Swelling, redness right ankle.   Neurological: She is alert and oriented to person, place, and time. She has normal  reflexes.  Skin: Skin is warm and dry.  Psychiatric: She has a normal mood and affect. Her behavior is normal. Judgment and thought content normal.  Nursing note reviewed.   BP 110/72 (BP Location: Left Arm, Patient Position: Sitting, Cuff Size: Large)   Pulse 90   Ht 5\' 2"  (1.575 m)   Wt 185 lb (83.9 kg)   LMP 01/17/2017   SpO2 100%   BMI 33.84 kg/m  Wt Readings from Last 3 Encounters:  02/02/19 185 lb (83.9 kg)  09/19/18 199 lb (90.3 kg)  09/12/18 199 lb 12.8 oz (90.6 kg)     There are no preventive care reminders to display for this patient.  There are no preventive care reminders to display for this patient.  Lab Results  Component Value Date   TSH 1.34 05/17/2017   Lab Results  Component Value Date   WBC 6.7 05/06/2018   HGB 11.7 (L) 05/06/2018   HCT 34.9 (L) 05/06/2018   MCV 88.4 05/06/2018   PLT 253.0 05/06/2018   Lab Results  Component Value Date   NA 139 10/02/2017   K 3.8 10/02/2017   CO2 29 10/02/2017   GLUCOSE 136 (H) 10/02/2017   BUN 9 10/02/2017   CREATININE 0.70 10/02/2017   BILITOT 0.2 05/06/2018   ALKPHOS 95 05/06/2018   AST 13 05/06/2018   ALT 16 05/06/2018   PROT 6.3 05/06/2018   ALBUMIN 3.6 05/06/2018   CALCIUM 8.7 (L) 10/02/2017   ANIONGAP 8 10/02/2017   Lab Results  Component Value Date   CHOL 270 (H) 10/26/2016   Lab Results  Component Value Date   HDL 69 10/26/2016   Lab Results  Component Value Date   LDLCALC 181 (H) 10/26/2016   Lab Results  Component Value Date   TRIG 100 10/26/2016   Lab Results  Component Value Date   CHOLHDL 3.9 10/26/2016  Lab Results  Component Value Date   HGBA1C 5.4 09/12/2018   Assessment & Plan:   1. Flu-like symptoms Testing for Influenza is negative today . She will continue OTC Cold/Flu medications, increase fluids, and get plenty of rest. She will report to office if symptoms do not improve or worsen.  - POCT Influenza A/B  2. Vertigo - meclizine (ANTIVERT) 25 MG tablet; Take  1 tablet (25 mg total) by mouth 3 (three) times daily as needed for dizziness.  Dispense: 30 tablet; Refill: 3  3. Cough We will initiate Robitussin today for cough. She will continue Mucinex tablets as needed for congestion.  - guaiFENesin (ROBITUSSIN) 100 MG/5ML SOLN; Take 5 mLs (100 mg total) by mouth every 4 (four) hours as needed for cough or to loosen phlegm.  Dispense: 236 mL; Refill: 0  4. Sprain of right ankle, unspecified ligament, initial encounter We will order X-ray of right ankle today to further evaluate.   5. Constipation, unspecified constipation type - polyethylene glycol (MIRALAX) packet; Take 17 g by mouth daily.  Dispense: 14 each; Refill: 3  6. Anxiety Mild today.   7. Follow up She will follow up in 6 months.   Meds ordered this encounter  Medications  . meclizine (ANTIVERT) 25 MG tablet    Sig: Take 1 tablet (25 mg total) by mouth 3 (three) times daily as needed for dizziness.    Dispense:  30 tablet    Refill:  3  . polyethylene glycol (MIRALAX) packet    Sig: Take 17 g by mouth daily.    Dispense:  14 each    Refill:  3  . guaiFENesin (ROBITUSSIN) 100 MG/5ML SOLN    Sig: Take 5 mLs (100 mg total) by mouth every 4 (four) hours as needed for cough or to loosen phlegm.    Dispense:  236 mL    Refill:  0   Meds ordered this encounter  Medications  . meclizine (ANTIVERT) 25 MG tablet    Sig: Take 1 tablet (25 mg total) by mouth 3 (three) times daily as needed for dizziness.    Dispense:  30 tablet    Refill:  3  . polyethylene glycol (MIRALAX) packet    Sig: Take 17 g by mouth daily.    Dispense:  14 each    Refill:  3  . guaiFENesin (ROBITUSSIN) 100 MG/5ML SOLN    Sig: Take 5 mLs (100 mg total) by mouth every 4 (four) hours as needed for cough or to loosen phlegm.    Dispense:  236 mL    Refill:  0   Orders Placed This Encounter  Procedures  . POCT Influenza A/B   Referral Orders  No referral(s) requested today   Kathe Becton,  MSN,  FNP-C [facility] [place], [place] [address] [phone]  Problem List Items Addressed This Visit    None    Visit Diagnoses    Flu-like symptoms    -  Primary   Relevant Orders   POCT Influenza A/B (Completed)   Vertigo       Relevant Medications   meclizine (ANTIVERT) 25 MG tablet   Cough       Relevant Medications   guaiFENesin (ROBITUSSIN) 100 MG/5ML SOLN   Sprain of right ankle, unspecified ligament, initial encounter       Constipation, unspecified constipation type       Relevant Medications   polyethylene glycol (MIRALAX) packet  Anxiety       Relevant Medications   FLUoxetine (PROZAC) 40 MG capsule   Follow up          Meds ordered this encounter  Medications  . meclizine (ANTIVERT) 25 MG tablet    Sig: Take 1 tablet (25 mg total) by mouth 3 (three) times daily as needed for dizziness.    Dispense:  30 tablet    Refill:  3  . polyethylene glycol (MIRALAX) packet    Sig: Take 17 g by mouth daily.    Dispense:  14 each    Refill:  3  . guaiFENesin (ROBITUSSIN) 100 MG/5ML SOLN    Sig: Take 5 mLs (100 mg total) by mouth every 4 (four) hours as needed for cough or to loosen phlegm.    Dispense:  236 mL    Refill:  0    Follow-up: Return in about 6 months (around 08/03/2019).    Azzie Glatter, FNP

## 2019-02-02 NOTE — Patient Instructions (Signed)
Ankle Sprain  An ankle sprain is a stretch or tear in one of the tough tissues (ligaments) that connect the bones in your ankle. An ankle sprain can happen when the ankle rolls outward (inversion sprain) or inward (eversion sprain). What are the causes? This condition is caused by rolling or twisting the ankle. What increases the risk? You are more likely to develop this condition if you play sports. What are the signs or symptoms? Symptoms of this condition include:  Pain in your ankle.  Swelling.  Bruising. This may happen right after you sprain your ankle or 1-2 days later.  Trouble standing or walking. How is this diagnosed? This condition is diagnosed with:  A physical exam. During the exam, your doctor will press on certain parts of your foot and ankle and try to move them in certain ways.  X-ray imaging. These may be taken to see how bad the sprain is and to check for broken bones. How is this treated? This condition may be treated with:  A brace or splint. This is used to keep the ankle from moving until it heals.  An elastic bandage. This is used to support the ankle.  Crutches.  Pain medicine.  Surgery. This may be needed if the sprain is very bad.  Physical therapy. This may help to improve movement in the ankle. Follow these instructions at home: If you have a brace or a splint:  Wear the brace or splint as told by your doctor. Remove it only as told by your doctor.  Loosen the brace or splint if your toes: ? Tingle. ? Lose feeling (become numb). ? Turn cold and blue.  Keep the brace or splint clean.  If the brace or splint is not waterproof: ? Do not let it get wet. ? Cover it with a watertight covering when you take a bath or a shower. If you have an elastic bandage (dressing):  Remove it to shower or bathe.  Try not to move your ankle much, but wiggle your toes from time to time. This helps to prevent swelling.  Adjust the dressing if it feels  too tight.  Loosen the dressing if your foot: ? Loses feeling. ? Tingles. ? Becomes cold and blue. Managing pain, stiffness, and swelling   Take over-the-counter and prescription medicines only as told by doctor.  For 2-3 days, keep your ankle raised (elevated) above the level of your heart.  If told, put ice on the injured area: ? If you have a removable brace or splint, remove it as told by your doctor. ? Put ice in a plastic bag. ? Place a towel between your skin and the bag. ? Leave the ice on for 20 minutes, 2-3 times a day. General instructions  Rest your ankle.  Do not use your injured leg to support your body weight until your doctor says that you can. Use crutches as told by your doctor.  Do not use any products that contain nicotine or tobacco, such as cigarettes, e-cigarettes, and chewing tobacco. If you need help quitting, ask your doctor.  Keep all follow-up visits as told by your doctor. Contact a doctor if:  Your bruises or swelling are quickly getting worse.  Your pain does not get better after you take medicine. Get help right away if:  You cannot feel your toes or foot.  Your foot or toes look blue.  You have very bad pain that gets worse. Summary  An ankle sprain is a stretch   or tear in one of the tough tissues (ligaments) that connect the bones in your ankle.  This condition is caused by rolling or twisting the ankle.  Symptoms include pain, swelling, bruising, and trouble walking.  To help with pain and swelling, put ice on the injured ankle, raise your ankle above the level of your heart, and use an elastic bandage. Also, rest as told by your doctor.  Keep all follow-up visits as told by your doctor. This is important. This information is not intended to replace advice given to you by your health care provider. Make sure you discuss any questions you have with your health care provider. Document Released: 05/14/2008 Document Revised: 04/22/2018  Document Reviewed: 04/22/2018 Elsevier Interactive Patient Education  2019 Elsevier Inc.  

## 2019-02-02 NOTE — Telephone Encounter (Signed)
Patient calling regarding xray. Advise patient that report was not in yet and that we would contact her as soon as we have the report.

## 2019-02-03 DIAGNOSIS — S93401A Sprain of unspecified ligament of right ankle, initial encounter: Secondary | ICD-10-CM | POA: Insufficient documentation

## 2019-02-25 ENCOUNTER — Encounter (HOSPITAL_COMMUNITY): Payer: Self-pay

## 2019-02-25 ENCOUNTER — Ambulatory Visit (HOSPITAL_COMMUNITY)
Admission: EM | Admit: 2019-02-25 | Discharge: 2019-02-25 | Disposition: A | Discharge status: Home / Self Care | Payer: Medicaid Other | Attending: Family Medicine | Admitting: Family Medicine

## 2019-02-25 ENCOUNTER — Other Ambulatory Visit: Payer: Self-pay

## 2019-02-25 DIAGNOSIS — L309 Dermatitis, unspecified: Secondary | ICD-10-CM

## 2019-02-25 MED ORDER — TRIAMCINOLONE ACETONIDE 0.1 % EX CREA: 1.0000 "application " | TOPICAL_CREAM | Freq: Two times a day (BID) | CUTANEOUS | 0 refills | Status: DC

## 2019-02-25 MED ORDER — ACYCLOVIR 800 MG PO TABS: 800.0000 mg | ORAL_TABLET | Freq: Every day | ORAL | 0 refills | Status: DC

## 2019-02-25 NOTE — ED Provider Notes (Signed)
[facility]   [id number] 02/25/19 Arrival Time: [place]:  1. Dermatitis    Meds ordered this encounter  Medications  . triamcinolone cream (KENALOG) 0.1 %    Sig: Apply 1 application topically 2 (two) times daily.    Dispense:  30 g    Refill:  0  . acyclovir (ZOVIRAX) 800 MG tablet    Sig: Take 1 tablet (800 mg total) by mouth 5 (five) times daily.    Dispense:  35 tablet    Refill:  0   Question of shingles raised. Possible. Will see if this responds to triamcinolone cream. I have sent acyclovir to her pharmacy as requested.  Will follow up with PCP or here if worsening or failing to improve as anticipated. Reviewed expectations re: course of current medical issues. Questions answered. Outlined signs and symptoms indicating need for more acute intervention. Patient verbalized understanding. After Visit Summary given.   SUBJECTIVE:  Stephanie Serrano is a 53 y.o. female who presents with a skin complaint.   Location: lower left gluteal Onset: noticed 3-4 d ago Associated pruritis? yes Associated pain? none Progression: stable  Drainage? No  Known trigger? No  New soaps/lotions/topicals/detergents/environmental exposures? No Contacts with similar? No Recent travel? No  Other associated symptoms: none Therapies tried thus far: none Arthralgia or myalgia? none Recent illness? none Fever? none No specific aggravating or alleviating factors reported.  ROS: As per HPI.  OBJECTIVE: Vitals:   02/25/19 1830 02/25/19 1831  BP:  136/80  Pulse:  83  Resp:  18  Temp:  97.8 F (36.6 C)  SpO2:  99%  Weight: 80.3 kg     General appearance: alert; no distress Lungs: clear to auscultation bilaterally Heart: regular rate and rhythm Extremities: no edema Skin: warm and dry; signs of infection: none; several smooth, 2 x 4 cm area of slightly elevated and erythematous plaques of variable size over her L inferior buttock; no ulcerations; no  vesicles; no bleeding; no areas of fluctuance; no pain to palpation Psychological: alert and cooperative; normal mood and affect  Allergies  Allergen Reactions  . Azithromycin   . Clindamycin/Lincomycin   . Doxycycline     Swelling of mucous membrane, body pain  . Penicillins     Has patient had a PCN reaction causing immediate rash, facial/tongue/throat swelling, SOB or lightheadedness with hypotension: yes Has patient had a PCN reaction causing severe rash involving mucus membranes or skin necrosis: no Has patient had a PCN reaction that required hospitalization: no Has patient had a PCN reaction occurring within the last 10 years: no If all of the above answers are "NO", then may proceed with Cephalosporin use.   . Sulfa Antibiotics   . Sulfasalazine Other (See Comments)  . Vicodin [Hydrocodone-Acetaminophen] Nausea Only    Past Medical History:  Diagnosis Date  . Allergy   . Anemia   . Anxiety   . Arthritis   . Asthma   . Blood transfusion without reported diagnosis   . Depression   . Eczema   . Esophageal stricture   . GERD (gastroesophageal reflux disease)   . Migraines   . OCD (obsessive compulsive disorder)   . Plantar fasciitis of right foot   . RLS (restless legs syndrome)   . Vertigo    Social History   Socioeconomic History  . Marital status: Divorced    Spouse name: Not on file  . Number of children: 2  . Years of education: Not on file  .  Highest education level: Not on file  Occupational History  . Occupation: Special educational needs teacher: [employer]  . Financial resource strain: Not on file  . Food insecurity:    Worry: Not on file    Inability: Not on file  . Transportation needs:    Medical: Not on file    Non-medical: Not on file  Tobacco Use  . Smoking status: Never Smoker  . Smokeless tobacco: Never Used  Substance and Sexual Activity  . Alcohol use: Yes    Comment: socially  . Drug use: No  . Sexual activity: Never  Lifestyle   . Physical activity:    Days per week: Not on file    Minutes per session: Not on file  . Stress: Not on file  Relationships  . Social connections:    Talks on phone: Not on file    Gets together: Not on file    Attends religious service: Not on file    Active member of club or organization: Not on file    Attends meetings of clubs or organizations: Not on file    Relationship status: Not on file  . Intimate partner violence:    Fear of current or ex partner: Not on file    Emotionally abused: Not on file    Physically abused: Not on file    Forced sexual activity: Not on file  Other Topics Concern  . Not on file  Social History Narrative  . Not on file   Family History  Problem Relation Age of Onset  . Pancreatic cancer Father   . Allergic rhinitis Father   . Asthma Father   . Stomach cancer Father   . Diabetes Mother   . Heart disease Mother   . Allergic rhinitis Mother   . Anxiety disorder Brother   . Diabetes Paternal Grandmother   . Colon cancer Maternal Grandmother   . Cancer Other   . Esophageal cancer Neg Hx   . Eczema Neg Hx   . Urticaria Neg Hx   . Liver cancer Neg Hx   . Rectal cancer Neg Hx    Past Surgical History:  Procedure Laterality Date  . ESOPHAGOGASTRODUODENOSCOPY N/A 02/09/2015   Procedure: ESOPHAGOGASTRODUODENOSCOPY (EGD);  Surgeon: Inda Castle, MD;  Location: [facility];  Service: Endoscopy;  Laterality: N/A;  with dilation  . FOOT SURGERY    . NASAL SINUS SURGERY    . PLANTAR FASCIA RELEASE Right 08/03/2016   Procedure: PLANTAR FASCIA RELEASE WITH BONE SPUR EXCISION;  Surgeon: Dorna Leitz, MD;  Location: [facility];  Service: Orthopedics;  Laterality: Right;  . Evonnie Dawes, MD 02/26/19 [phone]

## 2019-03-23 ENCOUNTER — Ambulatory Visit: Payer: Self-pay | Admitting: Family Medicine

## 2019-05-15 ENCOUNTER — Ambulatory Visit: Payer: Medicare Other | Admitting: Physical Therapy

## 2019-06-05 ENCOUNTER — Telehealth: Payer: Self-pay | Admitting: *Deleted

## 2019-06-05 NOTE — Telephone Encounter (Signed)
Pt called stating she had stomach surgery and is unable to give injections in her stomach any more. She states that it hurts too much to administer into her thigh. Patient would like to start coming back in to receive her Dupixent injections in her arms. Patient has been placed on the schedule for 06/08/19 and will be bringing in her [place].

## 2019-06-08 ENCOUNTER — Ambulatory Visit (INDEPENDENT_AMBULATORY_CARE_PROVIDER_SITE_OTHER): Payer: Medicare Other | Admitting: *Deleted

## 2019-06-08 ENCOUNTER — Other Ambulatory Visit: Payer: Self-pay

## 2019-06-08 DIAGNOSIS — J4540 Moderate persistent asthma, uncomplicated: Secondary | ICD-10-CM

## 2019-06-08 DIAGNOSIS — J4520 Mild intermittent asthma, uncomplicated: Secondary | ICD-10-CM

## 2019-06-08 NOTE — Progress Notes (Signed)
Immunotherapy   Patient Details  Name: Stephanie Serrano MRN: [id number] Date of Birth: 16-Sep-1966  06/08/2019  [name] started injections for  Dupixent   Frequency: Every 2 weeks  Epi-Pen: Yes Consent signed and patient instructions given. Patient was self administering Dupixent at home but since recent stomach surgery she has been having complications giving herself the medication. Patient has decided to start coming back in to receive her injections in the shot room. Patient received 300mg  of Dupixent in the RUA and lsw. Patient's other injection has been labeled with her name and placed in the [facility] refrigerator.    Ashleigh Fernandez-Vernon 06/08/2019, 2:25 PM

## 2019-06-22 ENCOUNTER — Other Ambulatory Visit: Payer: Self-pay

## 2019-06-22 ENCOUNTER — Ambulatory Visit (INDEPENDENT_AMBULATORY_CARE_PROVIDER_SITE_OTHER): Payer: Medicare Other

## 2019-06-22 DIAGNOSIS — L209 Atopic dermatitis, unspecified: Secondary | ICD-10-CM

## 2019-06-22 DIAGNOSIS — J4520 Mild intermittent asthma, uncomplicated: Secondary | ICD-10-CM

## 2019-06-22 MED ORDER — DUPILUMAB 300 MG/2ML ~~LOC~~ SOSY
300.0000 mg | PREFILLED_SYRINGE | SUBCUTANEOUS | Status: AC
  Administered 2019-06-22 – 2019-08-03 (×4): 300 mg via SUBCUTANEOUS

## 2019-06-23 ENCOUNTER — Other Ambulatory Visit: Payer: Self-pay | Admitting: Orthopaedic Surgery

## 2019-06-23 DIAGNOSIS — M25571 Pain in right ankle and joints of right foot: Secondary | ICD-10-CM

## 2019-06-26 ENCOUNTER — Other Ambulatory Visit: Payer: Self-pay

## 2019-06-26 ENCOUNTER — Ambulatory Visit
Admission: RE | Admit: 2019-06-26 | Discharge: 2019-06-26 | Disposition: A | Discharge status: Home / Self Care | Payer: Medicare Other | Source: Ambulatory Visit | Attending: Orthopaedic Surgery | Admitting: Orthopaedic Surgery

## 2019-06-26 DIAGNOSIS — M25571 Pain in right ankle and joints of right foot: Secondary | ICD-10-CM

## 2019-07-06 ENCOUNTER — Ambulatory Visit (INDEPENDENT_AMBULATORY_CARE_PROVIDER_SITE_OTHER): Payer: Medicare Other

## 2019-07-06 ENCOUNTER — Other Ambulatory Visit: Payer: Self-pay

## 2019-07-06 DIAGNOSIS — L209 Atopic dermatitis, unspecified: Secondary | ICD-10-CM

## 2019-07-16 ENCOUNTER — Ambulatory Visit (INDEPENDENT_AMBULATORY_CARE_PROVIDER_SITE_OTHER): Payer: Medicare Other | Admitting: Allergy & Immunology

## 2019-07-16 ENCOUNTER — Encounter: Payer: Self-pay | Admitting: Allergy & Immunology

## 2019-07-16 ENCOUNTER — Other Ambulatory Visit: Payer: Self-pay

## 2019-07-16 VITALS — BP 122/70 | HR 91 | Temp 98.1°F | Resp 16 | Ht 62.0 in | Wt 157.8 lb

## 2019-07-16 DIAGNOSIS — J3089 Other allergic rhinitis: Secondary | ICD-10-CM | POA: Diagnosis not present

## 2019-07-16 DIAGNOSIS — J302 Other seasonal allergic rhinitis: Secondary | ICD-10-CM

## 2019-07-16 DIAGNOSIS — J4520 Mild intermittent asthma, uncomplicated: Secondary | ICD-10-CM | POA: Diagnosis not present

## 2019-07-16 DIAGNOSIS — L301 Dyshidrosis [pompholyx]: Secondary | ICD-10-CM | POA: Diagnosis not present

## 2019-07-16 DIAGNOSIS — R000 Tachycardia, unspecified: Secondary | ICD-10-CM

## 2019-07-16 DIAGNOSIS — T39015A Adverse effect of aspirin, initial encounter: Secondary | ICD-10-CM

## 2019-07-16 DIAGNOSIS — J339 Nasal polyp, unspecified: Secondary | ICD-10-CM

## 2019-07-16 DIAGNOSIS — J984 Other disorders of lung: Secondary | ICD-10-CM

## 2019-07-16 NOTE — Progress Notes (Signed)
FOLLOW UP  Date of Service/Encounter:  07/16/19   Assessment:   Mild intermittent asthma, uncomplicated  Likely AERD  Seasonal and perennial allergic rhinitis (trees, grasses, outdoor molds, dust mites, cat and dog)  Dyshidrotic eczema - controlled with Dupixent   Perceived tachycardia - with a normal heart rate today in clinic (Cardiology referral pending)   Plan/Recommendations:   1. Mild intermittent asthma, uncomplicated - Lung function looked fairly good today.  - We will change you to the Xopenex 2 puffs as needed since you have the palpitations.  - This will likely be a higher tier medication.  - It does not seem that you need a controller medication for your asthma this time.  2. Tachycardia - We will refer you to Cardiology today.   - Heart rate was normal today, but she tells me that it has been higher over the last week. Stephanie Serrano is going to be arranging that and hopefully they will call you tomorrow or early next week to make this appointment.  - In the meantime, I recommended that she take her clonazepam again to see if this helps with her sensation of tachycardia, as it might just be related to uncontrolled anxiety.   3. Seasonal and perennial allergic rhinitis (trees, grasses, outdoor molds, dust mites, cat and dog) - Continue taking: budeonside nasal rinses twice daily as needed and Xyzal (levocetirizine) 5mg  tablet once daily as needed - You can use an extra dose of the antihistamine, if needed, for breakthrough symptoms.  - Consider nasal saline rinses 1-2 times daily to remove allergens from the nasal cavities as well as help with mucous clearance (this is especially helpful to do before the nasal sprays are given) - Consider allergy shots as a means of long-term control.  4. Dyshidrotic eczema - controlled with Dupixent every two weeks - Continue with the Dupixent every two weeks.  5. Return in about 6 months (around 01/16/2020). This can be an  in-person, a virtual Webex or a telephone follow up visit.   Subjective:   Stephanie Serrano is a 53 y.o. female presenting today for follow up of  Chief Complaint  Patient presents with  . Asthma  . Allergic Reaction    Latuda gave her heart palpatations    Stephanie Serrano has a history of the following: Patient Active Problem List   Diagnosis Date Noted  . Sprain of right ankle 02/03/2019  . MDD (major depressive disorder), recurrent episode, moderate ([place]) 06/28/2017  . OCD (obsessive compulsive disorder) 03/26/2017  . Obstructive sleep apnea of adult 01/18/2017  . Insomnia 02/08/2016  . Heel spur 09/01/2015  . Family history of cystitis 08/31/2015  . Left foot pain 08/31/2015  . Prediabetes 08/23/2015  . Hyperlipidemia 08/23/2015  . Depression 03/21/2015  . Acute esophagitis 02/09/2015  . Gastroesophageal reflux disease without esophagitis 08/27/2014  . Dysphagia, unspecified(787.20) 03/22/2014   [place] [facility]  History obtained from: chart review and patient and her daughter.  Stephanie Serrano is a 53 y.o. female presenting for a follow up visit. She was last seen in November 2019. At that time, we continued with albuterol every 4-6 hours as needed. For his seasonal and perennial allergic rhinitis, we continued with budesonide nasal rinses and Xyzal. We also continued her with Dupixent every two weeks for her eczema.   Asthma/Respiratory Symptom History: She tells me that her breathing is not great, although with more discussion it seems that she is referring to a perceived SOB with her elevated heart rate. She  has been on sleep medicines since she lost her daughter in 2018. She started Latududa on July 21st and she took it for ten days. She has had heart palpitations since that time. This has persisted for one week or so despite stopping the medication. She also tells me that she did not have the palpitations before this. She was on clonazepam before this and it was working just fine.  She has not been using her rescue inhaler in quite some time. She is not on an every day medication for her asthma. She has not needed any prednisone and has not been to the hospital for any asthma problems.   Allergic Rhinitis Symptom History: She is on the budesonide nasal rinses which she is using as needed. She feels that she is drowning. She would like Nasonex but this has never been covered. She denies any problems with her polyps. She has been using some aspirin for her headaches, but interestingly she has not noticed that her nasal symptoms worsen at all.    Eczema Symptom History: She remains on the Dupixent every two weeks. This has resolved her eczema completely. She is not using any other medications for her eczema. She loves how well the Dupixent is working to control her symptoms.   Otherwise, there have been no changes to her past medical history, surgical history, family history, or social history.    Review of Systems  Constitutional: Negative.  Negative for chills, fever, malaise/fatigue and weight loss.  HENT: Negative.  Negative for congestion, ear discharge and ear pain.   Eyes: Negative for pain, discharge and redness.  Respiratory: Positive for shortness of breath. Negative for cough, sputum production and wheezing.   Cardiovascular: Negative.  Negative for chest pain and palpitations.       Positive for perceived tacycardia.  Gastrointestinal: Negative for abdominal pain, constipation, diarrhea, heartburn, nausea and vomiting.  Skin: Negative.  Negative for itching and rash.  Neurological: Negative for dizziness and headaches.  Endo/Heme/Allergies: Negative for environmental allergies. Does not bruise/bleed easily.       Objective:   Blood pressure 122/70, pulse 91, temperature 98.1 F (36.7 C), temperature source Temporal, resp. rate 16, height 5\' 2"  (1.575 m), weight 157 lb 12.8 oz (71.6 kg), last menstrual period 01/17/2017, SpO2 99 %. Body mass index is 28.86  kg/m.   Physical Exam:  Physical Exam  Constitutional: She appears well-developed.  Somewhat anxious female. Cooperative with the exam.   HENT:  Head: Normocephalic and atraumatic.  Right Ear: Tympanic membrane, external ear and ear canal normal.  Left Ear: Tympanic membrane, external ear and ear canal normal.  Nose: Mucosal edema and rhinorrhea present. No nasal deformity or septal deviation. No epistaxis. Right sinus exhibits no maxillary sinus tenderness and no frontal sinus tenderness. Left sinus exhibits no maxillary sinus tenderness and no frontal sinus tenderness.  Mouth/Throat: Uvula is midline and oropharynx is clear and moist. Mucous membranes are not pale and not dry.  Tonsils 2+ bilaterally without discharge.   Eyes: Pupils are equal, round, and reactive to light. Conjunctivae and EOM are normal. Right eye exhibits no chemosis and no discharge. Left eye exhibits no chemosis and no discharge. Right conjunctiva is not injected. Left conjunctiva is not injected.  Cardiovascular: Normal rate and regular rhythm. Exam reveals gallop.  No murmur heard. HR 84  Respiratory: Effort normal and breath sounds normal. No accessory muscle usage. No tachypnea. No respiratory distress. She has no wheezes. She has no rhonchi. She has no  rales. She exhibits no tenderness.  Moving air well in all lung fields.   Lymphadenopathy:    She has no cervical adenopathy.  Neurological: She is alert.  Skin: No abrasion, no petechiae and no rash noted. Rash is not papular, not vesicular and not urticarial. No erythema. No pallor.  Skin is completely clear. Her hands show no evidence of the previously debilitating dyshidrotic eczema.   Psychiatric: She has a normal mood and affect.     Diagnostic studies:    Spirometry: results normal (FEV1: 2.30/110%, FVC: 2.73/105%, FEV1/FVC: 84%).    Spirometry consistent with normal pattern.   Allergy Studies: none      Salvatore Marvel, MD  Allergy and  [facility]

## 2019-07-16 NOTE — Patient Instructions (Addendum)
1. Mild intermittent asthma, uncomplicated - Lung function looked fairly good today.  - We will change you to the Xopenex 2 puffs as needed since you have the palpitations.  - This will likely be a higher tier medication.  - It does not seem that you need a controller medication for your asthma this time.  2. Tachycardia - We will refer you to Cardiology today.   Karena Addison is going to be arranging that and hopefully they will call you tomorrow or early next week to make this appointment.   3. Seasonal and perennial allergic rhinitis (trees, grasses, outdoor molds, dust mites, cat and dog) - Continue taking: budeonside nasal rinses twice daily as needed and Xyzal (levocetirizine) 5mg  tablet once daily as needed - You can use an extra dose of the antihistamine, if needed, for breakthrough symptoms.  - Consider nasal saline rinses 1-2 times daily to remove allergens from the nasal cavities as well as help with mucous clearance (this is especially helpful to do before the nasal sprays are given) - Consider allergy shots as a means of long-term control.  4. Dyshidrotic eczema - controlled with Dupixent every two weeks - Continue with the Dupixent every two weeks.  5. Return in about 6 months (around 01/16/2020). This can be an in-person, a virtual Webex or a telephone follow up visit.   Please inform us of any Emergency Department visits, hospitalizations, or changes in symptoms. Call us before going to the ED for breathing or allergy symptoms since we might be able to fit you in for a sick visit. Feel free to contact us anytime with any questions, problems, or concerns.  It was a pleasure to see you and your family again today!  Websites that have reliable patient information: 1. American Academy of Asthma, Allergy, and Immunology: www.aaaai.org 2. Food Allergy Research and Education (FARE): foodallergy.org 3. Mothers of Asthmatics: http://www.asthmacommunitynetwork.org 4. American College of  Allergy, Asthma, and Immunology: www.acaai.org  "Like" [place] on Facebook and Instagram for our latest updates!      Make sure you are registered to vote! If you have moved or changed any of your contact information, you will need to get this updated before voting!  In some cases, you MAY be able to register to vote online: [email or web address]    Voter ID laws are NOT going into effect for the General Election in November 2020! DO NOT let this stop you from exercising your right to vote!   Absentee voting is the SAFEST way to vote during the coronavirus pandemic!   Download and print an absentee ballot request form at [email or web address] or you can scan the QR code below with your smart phone:      More information on absentee ballots can be found here: [email or web address]

## 2019-07-20 ENCOUNTER — Ambulatory Visit: Payer: Self-pay

## 2019-07-21 ENCOUNTER — Ambulatory Visit (INDEPENDENT_AMBULATORY_CARE_PROVIDER_SITE_OTHER): Payer: Medicare Other | Admitting: *Deleted

## 2019-07-21 DIAGNOSIS — L209 Atopic dermatitis, unspecified: Secondary | ICD-10-CM

## 2019-07-22 NOTE — Progress Notes (Signed)
Virtual Visit via Video Note   This visit type was conducted due to national recommendations for restrictions regarding the COVID-19 Pandemic (e.g. social distancing) in an effort to limit this patient's exposure and mitigate transmission in our community.  Due to her co-morbid illnesses, this patient is at least at moderate risk for complications without adequate follow up.  This format is felt to be most appropriate for this patient at this time.  All issues noted in this document were discussed and addressed.  A limited physical exam was performed with this format.  Please refer to the patient's chart for her consent to telehealth for [facility].   Date:  07/23/2019   ID:  Stephanie Serrano, DOB [birth year], MRN [id number]  Patient Location: Home Provider Location: Home  PCP:  No primary care provider on file.  Cardiologist:  Minus Breeding, MD  Electrophysiologist:  None   Evaluation Performed:  New Patient  Chief Complaint:  Palpitations  History of Present Illness:    Stephanie Serrano is a 53 y.o. female who is referred by Valentina Shaggy, * for evaluation of tachycardia .  The patient has no past cardiac history.  She says it for a long while she has had intermittent palpitations.  This happens when she is doing nothing.  It is unprovoked.  She feels her heart racing.  It might last for about 30 minutes.  Happens several times during the day.  She says it feels like a fight or flight response.  She does have intermittent sporadic chest discomfort but she does not describe substernal chest pressure, neck or arm discomfort.  She does not have shortness of breath with these activities but she gets episodes of having to take deep breathing.  She does not describe PND or orthopnea.  She is had an intentional weight loss as she gained a lot of weight.  She is been grieving for the loss of her daughter who died a few years ago.  She is living with a 85 year old daughter.  She has some  limitations from ankle pain but she is been physically active.  The patient does not have symptoms concerning for COVID-19 infection (fever, chills, cough, or new shortness of breath).    Past Medical History:  Diagnosis Date   Allergy    Anemia    Anxiety    Arthritis    Asthma    Blood transfusion without reported diagnosis    Depression    Eczema    Esophageal stricture    GERD (gastroesophageal reflux disease)    Migraines    OCD (obsessive compulsive disorder)    RLS (restless legs syndrome)    Vertigo    Past Surgical History:  Procedure Laterality Date   ESOPHAGOGASTRODUODENOSCOPY N/A 02/09/2015   Procedure: ESOPHAGOGASTRODUODENOSCOPY (EGD);  Surgeon: Inda Castle, MD;  Location: [facility];  Service: Endoscopy;  Laterality: N/A;  with dilation   FOOT SURGERY     bone spurs   NASAL SINUS SURGERY     PLANTAR FASCIA RELEASE Right 08/03/2016   Procedure: PLANTAR FASCIA RELEASE WITH BONE SPUR EXCISION;  Surgeon: Dorna Leitz, MD;  Location: [facility];  Service: Orthopedics;  Laterality: Right;   TONSILLECTOMY      Prior to Admission medications   Medication Sig Start Date End Date Taking? Authorizing Provider  acyclovir (ZOVIRAX) 800 MG tablet Take 1 tablet (800 mg total) by mouth 5 (five) times daily. 02/25/19  Yes Vanessa Kick, MD  albuterol (PROVENTIL HFA;VENTOLIN HFA)  108 (90 Base) MCG/ACT inhaler Inhale 2 puffs into the lungs every 6 (six) hours as needed for wheezing or shortness of breath. 07/02/17  Yes Nwoko, Herbert Pun I, NP  clonazePAM (KLONOPIN) 1 MG tablet Take 1 mg by mouth 2 (two) times daily.   Yes [provider]  Dupilumab, Asthma, (DUPIXENT [place]) Inject into the skin.   Yes [provider]  levocetirizine (XYZAL) 5 MG tablet TAKE 1 TABLET BY MOUTH EVERY EVENING 10/27/18  Yes Valentina Shaggy, MD  LINZESS 72 MCG capsule TAKE 1 CAPSULE BY MOUTH DAILY BEFORE BREAKFAST 10/21/17  Yes Scot Jun, FNP    meclizine (ANTIVERT) 25 MG tablet Take 1 tablet (25 mg total) by mouth 3 (three) times daily as needed for dizziness. 02/02/19  Yes Azzie Glatter, FNP  ondansetron (ZOFRAN) 4 MG tablet Take 4 mg by mouth every 8 (eight) hours as needed for nausea or vomiting.   Yes [provider]  polyethylene glycol (MIRALAX) packet Take 17 g by mouth daily. 02/02/19  Yes Azzie Glatter, FNP  PULMICORT 0.5 MG/2ML nebulizer solution Use as directed for nasal irrigation twice daiy 03/13/18  Yes Valentina Shaggy, MD  traZODone (DESYREL) 50 MG tablet Take 1 tablet (50 mg total) by mouth at bedtime as needed for sleep. 07/02/17  Yes Lindell Spar I, NP  zolpidem (AMBIEN) 10 MG tablet Take 10 mg by mouth at bedtime as needed for sleep.   Yes [provider]     Allergies:   Azithromycin, Clindamycin/lincomycin, Doxycycline, Penicillins, Sulfa antibiotics, Sulfasalazine, Vicodin [hydrocodone-acetaminophen], and Codeine   Social History   Tobacco Use   Smoking status: Never Smoker   Smokeless tobacco: Never Used  Substance Use Topics   Alcohol use: Yes    Comment: socially   Drug use: No     Family Hx: The patient's family history includes Allergic rhinitis in her father and mother; Anxiety disorder in her brother; Asthma in her father; Cancer in an other family member; Colon cancer in her maternal grandmother; Diabetes in her mother and paternal grandmother; Heart disease in her mother; Pancreatic cancer in her father; Stomach cancer in her father. There is no history of Esophageal cancer, Eczema, Urticaria, Liver cancer, or Rectal cancer.  ROS:   Please see the history of present illness.    As stated in the HPI and negative for all other systems.   Prior CV studies:   The following studies were reviewed today:  None  Labs/Other Tests and Data Reviewed:    EKG:  No ECG reviewed.  Recent Labs: No results found for requested labs within last 8760 hours.   Recent Lipid  Panel Lab Results  Component Value Date/Time   CHOL 270 (H) 10/26/2016 09:01 AM   TRIG 100 10/26/2016 09:01 AM   HDL 69 10/26/2016 09:01 AM   CHOLHDL 3.9 10/26/2016 09:01 AM   LDLCALC 181 (H) 10/26/2016 09:01 AM    Wt Readings from Last 3 Encounters:  07/23/19 155 lb (70.3 kg)  07/16/19 157 lb 12.8 oz (71.6 kg)  02/25/19 177 lb (80.3 kg)     Objective:    Vital Signs:  Ht 5\' 2"  (1.575 m)    Wt 155 lb (70.3 kg)    LMP 01/17/2017    BMI 28.35 kg/m    VITAL SIGNS:  reviewed GEN:  no acute distress NEURO:  alert and oriented x 3, no obvious focal deficit PSYCH:  normal affect  ASSESSMENT & PLAN:    PALPITATIONS:  I will send her a 3-day Holter monitor to wear.  I will see her back in the office after this.  At that point I will check orthostatics, labs to include a TSH and CBC.  I did review labs that were from 2000-17, 18 and 19.  She did not have an abnormal TSH at that point.  However, there are not more recent labs.  Further management will be based on the results of this evaluation.   Time:   Today, I have spent 16  minutes with the patient with telehealth technology discussing the above problems.     Medication Adjustments/Labs and Tests Ordered: Current medicines are reviewed at length with the patient today.  Concerns regarding medicines are outlined above.   Tests Ordered: No orders of the defined types were placed in this encounter.   Medication Changes: No orders of the defined types were placed in this encounter.   Follow Up:  In Person after the monitor.   Signed, Minus Breeding, MD  07/23/2019 9:09 AM    [facility]

## 2019-07-23 ENCOUNTER — Encounter: Payer: Self-pay | Admitting: *Deleted

## 2019-07-23 ENCOUNTER — Telehealth (INDEPENDENT_AMBULATORY_CARE_PROVIDER_SITE_OTHER): Payer: Medicare Other | Admitting: Cardiology

## 2019-07-23 ENCOUNTER — Encounter: Payer: Self-pay | Admitting: Cardiology

## 2019-07-23 ENCOUNTER — Telehealth: Payer: Self-pay | Admitting: Radiology

## 2019-07-23 VITALS — Ht 62.0 in | Wt 155.0 lb

## 2019-07-23 DIAGNOSIS — R000 Tachycardia, unspecified: Secondary | ICD-10-CM | POA: Diagnosis not present

## 2019-07-23 NOTE — Patient Instructions (Addendum)
Medication Instructions:  Your physician recommends that you continue on your current medications as directed. Please refer to the Current Medication list given to you today.  If you need a refill on your cardiac medications before your next appointment, please call your pharmacy.   Lab work: NONE  Testing/Procedures: 3 DAY ZIO MONITOR  The office will call you to arrange, if you do not hear by Monday call [phone]  Follow-Up: 08/10/19 AT 1:20 WITH DR [facility] IN THE OFFICE

## 2019-07-23 NOTE — Addendum Note (Signed)
Addended by: Alvina Filbert B on: 07/23/2019 11:02 AM   Modules accepted: Orders

## 2019-07-23 NOTE — Telephone Encounter (Signed)
Enrolled patient for a 3 day Zio monitor to be mailed. Brief instructions were gone over witht he patient and she knows to expect the monitor to arrive in 3-4 days 

## 2019-07-27 ENCOUNTER — Other Ambulatory Visit (INDEPENDENT_AMBULATORY_CARE_PROVIDER_SITE_OTHER): Payer: Medicare Other

## 2019-07-27 DIAGNOSIS — R000 Tachycardia, unspecified: Secondary | ICD-10-CM

## 2019-08-03 ENCOUNTER — Ambulatory Visit (INDEPENDENT_AMBULATORY_CARE_PROVIDER_SITE_OTHER): Payer: Medicare Other | Admitting: Family Medicine

## 2019-08-03 ENCOUNTER — Other Ambulatory Visit: Payer: Self-pay | Admitting: *Deleted

## 2019-08-03 ENCOUNTER — Ambulatory Visit (INDEPENDENT_AMBULATORY_CARE_PROVIDER_SITE_OTHER): Payer: Medicare Other | Admitting: *Deleted

## 2019-08-03 ENCOUNTER — Other Ambulatory Visit: Payer: Self-pay

## 2019-08-03 ENCOUNTER — Encounter: Payer: Self-pay | Admitting: Family Medicine

## 2019-08-03 VITALS — BP 111/78 | HR 86 | Temp 98.9°F | Ht 62.0 in | Wt 154.0 lb

## 2019-08-03 DIAGNOSIS — Z0000 Encounter for general adult medical examination without abnormal findings: Secondary | ICD-10-CM

## 2019-08-03 DIAGNOSIS — E538 Deficiency of other specified B group vitamins: Secondary | ICD-10-CM | POA: Diagnosis not present

## 2019-08-03 DIAGNOSIS — F419 Anxiety disorder, unspecified: Secondary | ICD-10-CM

## 2019-08-03 DIAGNOSIS — F331 Major depressive disorder, recurrent, moderate: Secondary | ICD-10-CM

## 2019-08-03 DIAGNOSIS — Z09 Encounter for follow-up examination after completed treatment for conditions other than malignant neoplasm: Secondary | ICD-10-CM

## 2019-08-03 DIAGNOSIS — L209 Atopic dermatitis, unspecified: Secondary | ICD-10-CM

## 2019-08-03 DIAGNOSIS — E559 Vitamin D deficiency, unspecified: Secondary | ICD-10-CM | POA: Diagnosis not present

## 2019-08-03 MED ORDER — MOMETASONE FUROATE 50 MCG/ACT NA SUSP: 2.0000 | Freq: Every day | NASAL | 5 refills | Status: DC

## 2019-08-03 MED ORDER — LEVALBUTEROL TARTRATE 45 MCG/ACT IN AERO: 2.0000 | INHALATION_SPRAY | Freq: Four times a day (QID) | RESPIRATORY_TRACT | 5 refills | Status: AC | PRN

## 2019-08-03 MED ORDER — ACYCLOVIR 800 MG PO TABS: 800.0000 mg | ORAL_TABLET | Freq: Every day | ORAL | 3 refills | Status: DC

## 2019-08-03 NOTE — Progress Notes (Signed)
Patient [facility]   Established Patient Office Visit  Subjective:  Patient ID: Malvina Heilmann, female    DOB: [birth year]  Age: 53 y.o. MRN: [id number]  CC:  Chief Complaint  Patient presents with  . Follow-up    6 follow up     HPI Rasheed Givler is a 53 year old femalw who presents for Follow Up today.   Past Medical History:  Diagnosis Date  . Allergy   . Anemia   . Anxiety   . Arthritis   . Asthma   . Blood transfusion without reported diagnosis   . Depression   . Eczema   . Esophageal stricture   . GERD (gastroesophageal reflux disease)   . Migraines   . OCD (obsessive compulsive disorder)   . RLS (restless legs syndrome)   . Vertigo    Current Status: Since her last office visit, she is doing well with no complaints. She has been eating healthier and getting regular exercise. Her anxiety is mild today. She denies suicidal ideations, homicidal ideations, or auditory hallucinations. She continues to follow up with Psychiatry as needed. She continues to have mild left shoulder pain, which she takes Motrin for relief of symptoms. She continues to have occasional insomnia, which she takes Trazodone as needed.  She denies fevers, chills, fatigue, recent infections, weight loss, and night sweats. She has not had any headaches, visual changes, dizziness, and falls. No chest pain, heart palpitations, cough and shortness of breath reported. No reports of GI problems such as nausea, vomiting, diarrhea, and constipation. She has no reports of blood in stools, dysuria and hematuria. She denies pain today. She currently plains to relocate to [place] in a few weeks.   Past Surgical History:  Procedure Laterality Date  . ESOPHAGOGASTRODUODENOSCOPY N/A 02/09/2015   Procedure: ESOPHAGOGASTRODUODENOSCOPY (EGD);  Surgeon: Inda Castle, MD;  Location: [facility];  Service: Endoscopy;  Laterality: N/A;  with dilation  . FOOT SURGERY     bone  spurs  . NASAL SINUS SURGERY    . PLANTAR FASCIA RELEASE Right 08/03/2016   Procedure: PLANTAR FASCIA RELEASE WITH BONE SPUR EXCISION;  Surgeon: Dorna Leitz, MD;  Location: [facility];  Service: Orthopedics;  Laterality: Right;  . TONSILLECTOMY      Family History  Problem Relation Age of Onset  . Pancreatic cancer Father   . Allergic rhinitis Father   . Asthma Father   . Stomach cancer Father   . Diabetes Mother   . Heart disease Mother        No details   . Allergic rhinitis Mother   . Anxiety disorder Brother   . Diabetes Paternal Grandmother   . Colon cancer Maternal Grandmother   . Cancer Other   . Esophageal cancer Neg Hx   . Eczema Neg Hx   . Urticaria Neg Hx   . Liver cancer Neg Hx   . Rectal cancer Neg Hx     Social History   Socioeconomic History  . Marital status: Divorced    Spouse name: Not on file  . Number of children: 2  . Years of education: Not on file  . Highest education level: Not on file  Occupational History  . Occupation: Special educational needs teacher: [employer]  . Financial resource strain: Not on file  . Food insecurity    Worry: Not on file    Inability: Not on file  . Transportation  needs    Medical: Not on file    Non-medical: Not on file  Tobacco Use  . Smoking status: Never Smoker  . Smokeless tobacco: Never Used  Substance and Sexual Activity  . Alcohol use: Yes    Comment: socially  . Drug use: No  . Sexual activity: Never  Lifestyle  . Physical activity    Days per week: Not on file    Minutes per session: Not on file  . Stress: Not on file  Relationships  . Social Herbalist on phone: Not on file    Gets together: Not on file    Attends religious service: Not on file    Active member of club or organization: Not on file    Attends meetings of clubs or organizations: Not on file    Relationship status: Not on file  . Intimate partner violence    Fear of current or ex partner: Not on file     Emotionally abused: Not on file    Physically abused: Not on file    Forced sexual activity: Not on file  Other Topics Concern  . Not on file  Social History Narrative   Lives with daughter.  She did lose a daughter 6 years ago.      Outpatient Medications Prior to Visit  Medication Sig Dispense Refill  . albuterol (PROVENTIL HFA;VENTOLIN HFA) 108 (90 Base) MCG/ACT inhaler Inhale 2 puffs into the lungs every 6 (six) hours as needed for wheezing or shortness of breath.    . clonazePAM (KLONOPIN) 1 MG tablet Take 1 mg by mouth 2 (two) times daily.    . Dupilumab, Asthma, (DUPIXENT [place]) Inject into the skin.    [name] levocetirizine (XYZAL) 5 MG tablet TAKE 1 TABLET BY MOUTH EVERY EVENING 30 tablet 5  . LINZESS 72 MCG capsule TAKE 1 CAPSULE BY MOUTH DAILY BEFORE BREAKFAST 30 capsule 0  . meclizine (ANTIVERT) 25 MG tablet Take 1 tablet (25 mg total) by mouth 3 (three) times daily as needed for dizziness. 30 tablet 3  . ondansetron (ZOFRAN) 4 MG tablet Take 4 mg by mouth every 8 (eight) hours as needed for nausea or vomiting.    . polyethylene glycol (MIRALAX) packet Take 17 g by mouth daily. 14 each 3  . PULMICORT 0.5 MG/2ML nebulizer solution Use as directed for nasal irrigation twice daiy 120 mL 5  . traZODone (DESYREL) 50 MG tablet Take 1 tablet (50 mg total) by mouth at bedtime as needed for sleep. 30 tablet 0  . zolpidem (AMBIEN) 10 MG tablet Take 10 mg by mouth at bedtime as needed for sleep.    [name] acyclovir (ZOVIRAX) 800 MG tablet Take 1 tablet (800 mg total) by mouth 5 (five) times daily. 35 tablet 0   Facility-Administered Medications Prior to Visit  Medication Dose Route Frequency Provider Last Rate Last Dose  . dupilumab (DUPIXENT) prefilled syringe 300 mg  300 mg Subcutaneous Q14 Days Valentina Shaggy, MD   300 mg at 07/21/19 1746    Allergies  Allergen Reactions  . Azithromycin   . Clindamycin/Lincomycin   . Doxycycline     Swelling of mucous membrane, body pain  .  Penicillins     Has patient had a PCN reaction causing immediate rash, facial/tongue/throat swelling, SOB or lightheadedness with hypotension: yes Has patient had a PCN reaction causing severe rash involving mucus membranes or skin necrosis: no Has patient had a PCN reaction that required hospitalization: no Has  patient had a PCN reaction occurring within the last 10 years: no If all of the above answers are "NO", then may proceed with Cephalosporin use.   . Sulfa Antibiotics   . Sulfasalazine Other (See Comments)  . Vicodin [Hydrocodone-Acetaminophen] Nausea Only  . Codeine Anxiety, Itching and Nausea And Vomiting    ROS Review of Systems  Constitutional: Negative.   HENT: Negative.   Eyes: Negative.   Respiratory: Negative.   Cardiovascular: Negative.   Gastrointestinal: Positive for abdominal distention.  Endocrine: Negative.   Genitourinary: Negative.   Musculoskeletal: Positive for arthralgias (generalized. ).  Skin: Negative.   Allergic/Immunologic: Negative.   Neurological: Positive for dizziness (occasional ) and headaches (occasional).  Hematological: Negative.   Psychiatric/Behavioral: Negative.    Objective:    Physical Exam  Constitutional: She is oriented to person, place, and time. She appears well-developed and well-nourished.  HENT:  Head: Normocephalic and atraumatic.  Eyes: Conjunctivae are normal.  Neck: Normal range of motion. Neck supple.  Cardiovascular: Normal rate, regular rhythm, normal heart sounds and intact distal pulses.  Pulmonary/Chest: Breath sounds normal.  Abdominal: Soft. Bowel sounds are normal.  Musculoskeletal: Normal range of motion.  Neurological: She is alert and oriented to person, place, and time. She has normal reflexes.  Skin: Skin is warm and dry.  Psychiatric: She has a normal mood and affect. Her behavior is normal. Judgment and thought content normal.  Nursing note and vitals reviewed.   BP 111/78 (BP Location: Left Arm,  Patient Position: Sitting, Cuff Size: Normal)   Pulse 86   Temp 98.9 F (37.2 C) (Oral)   Ht 5\' 2"  (1.575 m)   Wt 154 lb (69.9 kg)   LMP 01/17/2017   SpO2 100%   BMI 28.17 kg/m  Wt Readings from Last 3 Encounters:  08/03/19 154 lb (69.9 kg)  07/23/19 155 lb (70.3 kg)  07/16/19 157 lb 12.8 oz (71.6 kg)     Health Maintenance Due  Topic Date Due  . HIV Screening  11/02/1981    There are no preventive care reminders to display for this patient.  Lab Results  Component Value Date   TSH 1.34 05/17/2017   Lab Results  Component Value Date   WBC 6.7 05/06/2018   HGB 11.7 (L) 05/06/2018   HCT 34.9 (L) 05/06/2018   MCV 88.4 05/06/2018   PLT 253.0 05/06/2018   Lab Results  Component Value Date   NA 139 10/02/2017   K 3.8 10/02/2017   CO2 29 10/02/2017   GLUCOSE 136 (H) 10/02/2017   BUN 9 10/02/2017   CREATININE 0.70 10/02/2017   BILITOT 0.2 05/06/2018   ALKPHOS 95 05/06/2018   AST 13 05/06/2018   ALT 16 05/06/2018   PROT 6.3 05/06/2018   ALBUMIN 3.6 05/06/2018   CALCIUM 8.7 (L) 10/02/2017   ANIONGAP 8 10/02/2017   Lab Results  Component Value Date   CHOL 270 (H) 10/26/2016   Lab Results  Component Value Date   HDL 69 10/26/2016   Lab Results  Component Value Date   LDLCALC 181 (H) 10/26/2016   Lab Results  Component Value Date   TRIG 100 10/26/2016   Lab Results  Component Value Date   CHOLHDL 3.9 10/26/2016   Lab Results  Component Value Date   HGBA1C 5.4 09/12/2018   Assessment & Plan:   1. MDD (major depressive disorder), recurrent episode, moderate (HCC) Stable today.  - CBC with Differential  2. Vitamin D deficiency - Vitamin D, 25-hydroxy  3. Vitamin B12 deficiency - Vitamin B12  4. Anxiety Stable today.   5. Healthcare maintenance - Comprehensive metabolic panel - Lipid Panel - TSH  6. Follow up She is relocating to [place] in a few weeks. She will follow up with our office if needed.   Meds ordered this encounter   Medications  . acyclovir (ZOVIRAX) 800 MG tablet    Sig: Take 1 tablet (800 mg total) by mouth 5 (five) times daily.    Dispense:  35 tablet    Refill:  3    Orders Placed This Encounter  Procedures  . CBC with Differential  . Comprehensive metabolic panel  . Lipid Panel  . TSH  . Vitamin B12  . Vitamin D, 25-hydroxy    Referral Orders  No referral(s) requested today    Kathe Becton,  MSN, FNP-BC [facility] [address] [place], [place] [address] [phone] fax  Problem List Items Addressed This Visit      Other   MDD (major depressive disorder), recurrent episode, moderate ([facility]) - Primary   Relevant Orders   CBC with Differential    Other Visit Diagnoses    Vitamin D deficiency       Relevant Orders   Vitamin D, 25-hydroxy   Vitamin B12 deficiency       Relevant Orders   Vitamin B12   Anxiety       Healthcare maintenance       Relevant Orders   Comprehensive metabolic panel   Lipid Panel   TSH   Follow up          Meds ordered this encounter  Medications  . acyclovir (ZOVIRAX) 800 MG tablet    Sig: Take 1 tablet (800 mg total) by mouth 5 (five) times daily.    Dispense:  35 tablet    Refill:  3    Follow-up: No follow-ups on file.    Azzie Glatter, FNP

## 2019-08-04 LAB — COMPREHENSIVE METABOLIC PANEL
ALT: 17 IU/L (ref 0–32)
AST: 18 IU/L (ref 0–40)
Albumin/Globulin Ratio: 2.3 — ABNORMAL HIGH (ref 1.2–2.2)
Albumin: 4.9 g/dL (ref 3.8–4.9)
Alkaline Phosphatase: 112 IU/L (ref 39–117)
BUN/Creatinine Ratio: 9 (ref 9–23)
BUN: 8 mg/dL (ref 6–24)
Bilirubin Total: 0.3 mg/dL (ref 0.0–1.2)
CO2: 24 mmol/L (ref 20–29)
Calcium: 9.7 mg/dL (ref 8.7–10.2)
Chloride: 101 mmol/L (ref 96–106)
Creatinine, Ser: 0.85 mg/dL (ref 0.57–1.00)
GFR calc Af Amer: 91 mL/min/{1.73_m2} (ref >59)
GFR calc non Af Amer: 79 mL/min/{1.73_m2} (ref >59)
Globulin, Total: 2.1 g/dL (ref 1.5–4.5)
Glucose: 95 mg/dL (ref 65–99)
Potassium: 4.5 mmol/L (ref 3.5–5.2)
Sodium: 139 mmol/L (ref 134–144)
Total Protein: 7 g/dL (ref 6.0–8.5)

## 2019-08-04 LAB — LIPID PANEL
Chol/HDL Ratio: 4 ratio (ref 0.0–4.4)
Cholesterol, Total: 310 mg/dL — ABNORMAL HIGH (ref 100–199)
HDL: 77 mg/dL (ref >39)
LDL Calculated: 220 mg/dL — ABNORMAL HIGH (ref 0–99)
Triglycerides: 67 mg/dL (ref 0–149)
VLDL Cholesterol Cal: 13 mg/dL (ref 5–40)

## 2019-08-04 LAB — CBC WITH DIFFERENTIAL/PLATELET
Basophils Absolute: 0.1 10*3/uL (ref 0.0–0.2)
Basos: 1 %
EOS (ABSOLUTE): 0.2 10*3/uL (ref 0.0–0.4)
Eos: 3 %
Hematocrit: 42.3 % (ref 34.0–46.6)
Hemoglobin: 14.2 g/dL (ref 11.1–15.9)
Immature Grans (Abs): 0 10*3/uL (ref 0.0–0.1)
Immature Granulocytes: 0 %
Lymphocytes Absolute: 2.8 10*3/uL (ref 0.7–3.1)
Lymphs: 44 %
MCH: 28.6 pg (ref 26.6–33.0)
MCHC: 33.6 g/dL (ref 31.5–35.7)
MCV: 85 fL (ref 79–97)
Monocytes Absolute: 0.4 10*3/uL (ref 0.1–0.9)
Monocytes: 6 %
Neutrophils Absolute: 2.9 10*3/uL (ref 1.4–7.0)
Neutrophils: 46 %
Platelets: 298 10*3/uL (ref 150–450)
RBC: 4.96 x10E6/uL (ref 3.77–5.28)
RDW: 13.5 % (ref 11.7–15.4)
WBC: 6.4 10*3/uL (ref 3.4–10.8)

## 2019-08-04 LAB — VITAMIN D 25 HYDROXY (VIT D DEFICIENCY, FRACTURES): Vit D, 25-Hydroxy: 22.5 ng/mL — ABNORMAL LOW (ref 30.0–100.0)

## 2019-08-04 LAB — TSH: TSH: 1.25 u[IU]/mL (ref 0.450–4.500)

## 2019-08-04 LAB — VITAMIN B12: Vitamin B-12: 1003 pg/mL (ref 232–1245)

## 2019-08-06 ENCOUNTER — Other Ambulatory Visit: Payer: Self-pay | Admitting: Family Medicine

## 2019-08-06 DIAGNOSIS — E785 Hyperlipidemia, unspecified: Secondary | ICD-10-CM

## 2019-08-06 MED ORDER — ATORVASTATIN CALCIUM 10 MG PO TABS: 10.0000 mg | ORAL_TABLET | Freq: Every day | ORAL | 1 refills | Status: DC

## 2019-08-08 DIAGNOSIS — I441 Atrioventricular block, second degree: Secondary | ICD-10-CM | POA: Diagnosis not present

## 2019-08-08 DIAGNOSIS — R002 Palpitations: Secondary | ICD-10-CM | POA: Diagnosis not present

## 2019-08-08 DIAGNOSIS — I493 Ventricular premature depolarization: Secondary | ICD-10-CM | POA: Diagnosis not present

## 2019-08-08 DIAGNOSIS — R000 Tachycardia, unspecified: Secondary | ICD-10-CM | POA: Diagnosis not present

## 2019-08-09 NOTE — Progress Notes (Signed)
Cardiology Office Note   Date:  08/10/2019   ID:  Stephanie Serrano, DOB 1966-06-13, MRN [id number]  PCP:  Azzie Glatter, FNP  Cardiologist:   Minus Breeding, MD   Chief Complaint  Patient presents with  . Palpitations      History of Present Illness: Stephanie Serrano is a 53 y.o. female who presents for evaluation of tachycardia .  I discussed this initially via a telehealth visit.  She had palpitations and was going to wear a Holter.   This demonstrated Mobitz Type I and rare SVEs and PVCs.  She feels these are frequently.  She thinks it has been more constant today.  We reviewed the event monitor.  She triggered about 16 times and some diarrhea increase.  The diary entries were during sinus rhythm.  She did occasionally report them during her PVCs and PACs.  She was asleep when she had Mobitz type I.  She was able to induce these.  She does not think she captured all of them.  She is dealing with emotional stress as previously described.   Past Medical History:  Diagnosis Date  . Allergy   . Anemia   . Anxiety   . Arthritis   . Asthma   . Blood transfusion without reported diagnosis   . Depression   . Eczema   . Esophageal stricture   . GERD (gastroesophageal reflux disease)   . Migraines   . OCD (obsessive compulsive disorder)   . RLS (restless legs syndrome)   . Vertigo     Past Surgical History:  Procedure Laterality Date  . ESOPHAGOGASTRODUODENOSCOPY N/A 02/09/2015   Procedure: ESOPHAGOGASTRODUODENOSCOPY (EGD);  Surgeon: Inda Castle, MD;  Location: [facility];  Service: Endoscopy;  Laterality: N/A;  with dilation  . FOOT SURGERY     bone spurs  . NASAL SINUS SURGERY    . PLANTAR FASCIA RELEASE Right 08/03/2016   Procedure: PLANTAR FASCIA RELEASE WITH BONE SPUR EXCISION;  Surgeon: Dorna Leitz, MD;  Location: [facility];  Service: Orthopedics;  Laterality: Right;  . TONSILLECTOMY       Current Outpatient Medications  Medication Sig  Dispense Refill  . acyclovir (ZOVIRAX) 800 MG tablet Take 1 tablet (800 mg total) by mouth 5 (five) times daily. 35 tablet 3  . albuterol (PROVENTIL HFA;VENTOLIN HFA) 108 (90 Base) MCG/ACT inhaler Inhale 2 puffs into the lungs every 6 (six) hours as needed for wheezing or shortness of breath.    [name] atorvastatin (LIPITOR) 10 MG tablet Take 1 tablet (10 mg total) by mouth daily. 90 tablet 1  . clonazePAM (KLONOPIN) 1 MG tablet Take 1 mg by mouth 2 (two) times daily.    . Dupilumab, Asthma, (DUPIXENT [place]) Inject into the skin.    [name] levalbuterol (XOPENEX HFA) 45 MCG/ACT inhaler Inhale 2 puffs into the lungs every 6 (six) hours as needed for wheezing. 1 Inhaler 5  . levocetirizine (XYZAL) 5 MG tablet TAKE 1 TABLET BY MOUTH EVERY EVENING 30 tablet 5  . LINZESS 72 MCG capsule TAKE 1 CAPSULE BY MOUTH DAILY BEFORE BREAKFAST 30 capsule 0  . meclizine (ANTIVERT) 25 MG tablet Take 1 tablet (25 mg total) by mouth 3 (three) times daily as needed for dizziness. 30 tablet 3  . mometasone (NASONEX) 50 MCG/ACT nasal spray Place 2 sprays into the nose daily. 17 g 5  . ondansetron (ZOFRAN) 4 MG tablet Take 4 mg by mouth every 8 (eight) hours as needed for nausea or vomiting.    [name]  polyethylene glycol (MIRALAX) packet Take 17 g by mouth daily. 14 each 3  . propranolol (INDERAL) 10 MG tablet Take 1 tablet (10 mg total) by mouth 2 (two) times daily. 180 tablet 1  . PULMICORT 0.5 MG/2ML nebulizer solution Use as directed for nasal irrigation twice daiy 120 mL 5  . traZODone (DESYREL) 50 MG tablet Take 1 tablet (50 mg total) by mouth at bedtime as needed for sleep. 30 tablet 0  . zolpidem (AMBIEN) 10 MG tablet Take 10 mg by mouth at bedtime as needed for sleep.     Current Facility-Administered Medications  Medication Dose Route Frequency Provider Last Rate Last Dose  . dupilumab (DUPIXENT) prefilled syringe 300 mg  300 mg Subcutaneous Q14 Days Valentina Shaggy, MD   300 mg at 08/03/19 1533    Allergies:    Azithromycin, Clindamycin/lincomycin, Doxycycline, Penicillins, Sulfa antibiotics, Sulfasalazine, Vicodin [hydrocodone-acetaminophen], and Codeine    ROS:  Please see the history of present illness.   Otherwise, review of systems are positive for none.   All other systems are reviewed and negative.    PHYSICAL EXAM: VS:  BP 136/60   Pulse 77   Temp (!) 97.3 F (36.3 C) (Temporal)   Ht 5\' 2"  (1.575 m)   Wt 155 lb 6.4 oz (70.5 kg)   LMP 01/17/2017   SpO2 99%   BMI 28.42 kg/m  , BMI Body mass index is 28.42 kg/m. GENERAL:  Well appearing NECK:  No jugular venous distention, waveform within normal limits, carotid upstroke brisk and symmetric, no bruits, no thyromegaly LUNGS:  Clear to auscultation bilaterally CHEST:  Unremarkable HEART:  PMI not displaced or sustained,S1 and S2 within normal limits, no S3, no S4, no clicks, no rubs, no murmurs ABD:  Flat, positive bowel sounds normal in frequency in pitch, no bruits, no rebound, no guarding, no midline pulsatile mass, no hepatomegaly, no splenomegaly EXT:  2 plus pulses throughout, no edema, no cyanosis no clubbing  EKG:  EKG is ordered today. The ekg ordered today demonstrates normal sinus rhythm, rate 77, axis within normal limits, intervals within normal limits, no acute ST-T wave changes.   Recent Labs: 08/03/2019: ALT 17; BUN 8; Creatinine, Ser 0.85; Hemoglobin 14.2; Platelets 298; Potassium 4.5; Sodium 139; TSH 1.250    Lipid Panel    Component Value Date/Time   CHOL 310 (H) 08/03/2019 1444   TRIG 67 08/03/2019 1444   HDL 77 08/03/2019 1444   CHOLHDL 4.0 08/03/2019 1444   CHOLHDL 3.9 10/26/2016 0901   VLDL 20 10/26/2016 0901   LDLCALC 220 (H) 08/03/2019 1444      Wt Readings from Last 3 Encounters:  08/10/19 155 lb 6.4 oz (70.5 kg)  08/03/19 154 lb (69.9 kg)  07/23/19 155 lb (70.3 kg)      Other studies Reviewed: Additional studies/ records that were reviewed today include: Event monitor.. Review of the above  records demonstrates:  Please see elsewhere in the note.     ASSESSMENT AND PLAN:  PALPITATIONS:     We talked about this for quite awhile.  I suspect that she has a structurally normal heart.  I will check an echo.  She is quite bothered by these so we will try propranolol at a low dose.  We discussed avoiding caffeine which she already does.  We discussed stress management which she already does.   Current medicines are reviewed at length with the patient today.  The patient does not have concerns regarding medicines.  The  following changes have been made:  As above  Labs/ tests ordered today include:   Orders Placed This Encounter  Procedures  . EKG 12-Lead  . ECHOCARDIOGRAM COMPLETE     Disposition:   FU with APP in two months.     Signed, Minus Breeding, MD  08/10/2019 2:02 PM    [place]

## 2019-08-10 ENCOUNTER — Encounter: Payer: Self-pay | Admitting: Cardiology

## 2019-08-10 ENCOUNTER — Ambulatory Visit (INDEPENDENT_AMBULATORY_CARE_PROVIDER_SITE_OTHER): Payer: Medicare Other | Admitting: Cardiology

## 2019-08-10 ENCOUNTER — Other Ambulatory Visit: Payer: Self-pay

## 2019-08-10 VITALS — BP 136/60 | HR 77 | Temp 97.3°F | Ht 62.0 in | Wt 155.4 lb

## 2019-08-10 DIAGNOSIS — R002 Palpitations: Secondary | ICD-10-CM | POA: Diagnosis not present

## 2019-08-10 DIAGNOSIS — I493 Ventricular premature depolarization: Secondary | ICD-10-CM

## 2019-08-10 DIAGNOSIS — R000 Tachycardia, unspecified: Secondary | ICD-10-CM

## 2019-08-10 DIAGNOSIS — I441 Atrioventricular block, second degree: Secondary | ICD-10-CM

## 2019-08-10 MED ORDER — PROPRANOLOL HCL 10 MG PO TABS: 10.0000 mg | ORAL_TABLET | Freq: Two times a day (BID) | ORAL | 1 refills | Status: DC

## 2019-08-10 NOTE — Patient Instructions (Signed)
Medication Instructions:  START PROPRANOLOL 10 MG TWICE A DAY   If you need a refill on your cardiac medications before your next appointment, please call your pharmacy.   Lab work: NONE   Testing/Procedures: Your physician has requested that you have an echocardiogram. Echocardiography is a painless test that uses sound waves to create images of your heart. It provides your doctor with information about the size and shape of your heart and how well your heart's chambers and valves are working. This procedure takes approximately one hour. There are no restrictions for this procedure. [address]  Follow-Up: At [facility], you and your health needs are our priority.  As part of our continuing mission to provide you with exceptional heart care, we have created designated Provider Care Teams.  These Care Teams include your primary Cardiologist (physician) and Advanced Practice Providers (APPs -  Physician Assistants and Nurse Practitioners) who all work together to provide you with the care you need, when you need it. You will need a follow up appointment in [place]  . Stephanie Sims, DNP, ANP  Any Other Special Instructions Will Be Listed Below (If Applicable).   Echocardiogram An echocardiogram is a procedure that uses painless sound waves (ultrasound) to produce an image of the heart. Images from an echocardiogram can provide important information about:  Signs of coronary artery disease (CAD).  Aneurysm detection. An aneurysm is a weak or damaged part of an artery wall that bulges out from the normal force of blood pumping through the body.  Heart size and shape. Changes in the size or shape of the heart can be associated with certain conditions, including heart failure, aneurysm, and CAD.  Heart muscle function.  Heart valve function.  Signs of a past heart attack.  Fluid buildup around the heart.  Thickening of the heart  muscle.  A tumor or infectious growth around the heart valves. Tell a health care provider about:  Any allergies you have.  All medicines you are taking, including vitamins, herbs, eye drops, creams, and over-the-counter medicines.  Any blood disorders you have.  Any surgeries you have had.  Any medical conditions you have.  Whether you are pregnant or may be pregnant. What are the risks? Generally, this is a safe procedure. However, problems may occur, including:  Allergic reaction to dye (contrast) that may be used during the procedure. What happens before the procedure? No specific preparation is needed. You may eat and drink normally. What happens during the procedure?   An IV tube may be inserted into one of your veins.  You may receive contrast through this tube. A contrast is an injection that improves the quality of the pictures from your heart.  A gel will be applied to your chest.  A wand-like tool (transducer) will be moved over your chest. The gel will help to transmit the sound waves from the transducer.  The sound waves will harmlessly bounce off of your heart to allow the heart images to be captured in real-time motion. The images will be recorded on a computer. The procedure may vary among health care providers and hospitals. What happens after the procedure?  You may return to your normal, everyday life, including diet, activities, and medicines, unless your health care provider tells you not to do that. Summary  An echocardiogram is a procedure that uses painless sound waves (ultrasound) to produce an image of the heart.  Images from  an echocardiogram can provide important information about the size and shape of your heart, heart muscle function, heart valve function, and fluid buildup around your heart.  You do not need to do anything to prepare before this procedure. You may eat and drink normally.  After the echocardiogram is completed, you may  return to your normal, everyday life, unless your health care provider tells you not to do that. This information is not intended to replace advice given to you by your health care provider. Make sure you discuss any questions you have with your health care provider. Document Released: 11/23/2000 Document Revised: 03/19/2019 Document Reviewed: 12/29/2016 Elsevier Patient Education  2020 [employer].

## 2019-08-11 ENCOUNTER — Ambulatory Visit: Payer: Medicare Other | Admitting: Allergy & Immunology

## 2019-08-11 ENCOUNTER — Telehealth: Payer: Self-pay | Admitting: Cardiology

## 2019-08-11 NOTE — Telephone Encounter (Signed)
Error

## 2019-08-13 ENCOUNTER — Telehealth: Payer: Self-pay | Admitting: Cardiology

## 2019-08-13 ENCOUNTER — Telehealth: Payer: Self-pay

## 2019-08-13 NOTE — Telephone Encounter (Signed)
Called and LVM advising of message from PharmD. Left call back number if questions.

## 2019-08-13 NOTE — Telephone Encounter (Signed)
Latuda can cause some tachycardia, but I don't see anything to indicate it would be a permanent problem after stopping the medication.  Looks like she saw Dr. Percival Spanish earlier this week and he is aware

## 2019-08-13 NOTE — Telephone Encounter (Signed)
Pt called and just wanted you to know that she had an reaction to the drug latquta and that was the reason for the her cholesterol being high. That one of the main causes, I asked the pt if she would like to come back in for labs so we can recheck to if was a true reaction from the  medication or if in fact elevated the patient said that she has already moved and will no longer be coming to the practice, also she stated that she didn't being taking the cholesterol also because of her ankle because it can causes muscle weakness.

## 2019-08-13 NOTE — Telephone Encounter (Signed)
Called andl/m for pt to call us back. 

## 2019-08-13 NOTE — Telephone Encounter (Signed)
Patient states she has tachycardia, and arrhythmia and palpitations and feeling them.  She is wondering if the Latuda could have caused these issues after starting to take it is when she noticed these issues, even now she has continued with the symptoms but just wanted to know if it could have started them.   On July 31 she stopped taking it, called her on call psychiatrist who gave her another medication to take to hopefully help her not have the racing heart rate feeling.

## 2019-08-13 NOTE — Telephone Encounter (Signed)
Called and spoke with patient.

## 2019-08-13 NOTE — Telephone Encounter (Signed)
New message   Pt c/o medication issue:  1. Name of Medication: Latuda  2. How are you currently taking this medication (dosage and times per day)? 1 time daily  3. Are you having a reaction (difficulty breathing--STAT)? No   4. What is your medication issue? Patient wants to know if this medication could have started her heart problems?

## 2019-08-18 ENCOUNTER — Telehealth: Payer: Self-pay | Admitting: Cardiology

## 2019-08-18 ENCOUNTER — Telehealth: Payer: Self-pay | Admitting: *Deleted

## 2019-08-18 NOTE — Telephone Encounter (Signed)
Submitted PA for Levalbuterol 45 mcg through cover my meds to [employer] [employer]. Waiting for response.   Pt's insurance no longer covers mometasone 50 mcg- covered alternatives are azelastine, flonase, and flunisolide. Please advise.

## 2019-08-18 NOTE — Telephone Encounter (Signed)
levalbuterol approved through 12/10/19. Faxed to pharmacy and sent to scan.

## 2019-08-18 NOTE — Telephone Encounter (Signed)
Patient called stating she needs a letter stating she needs to more back to [facility] because Dr. Percival Spanish wants to do further testing on her heart. And all her insurance is here in [place] she stated.

## 2019-08-19 ENCOUNTER — Telehealth: Payer: Self-pay | Admitting: Cardiology

## 2019-08-19 NOTE — Telephone Encounter (Signed)
Will forward to Dr Hochrein for review  

## 2019-08-19 NOTE — Telephone Encounter (Deleted)
New message:    Patient is living out of town and she is trying to come back so Dr. Percival Spanish. Her complex would like a e-mail stating why it is important for her to come back. Please call patient e-mail for complex is [email or web address] she need it to say that it is important for her to come and get her heart check.

## 2019-08-19 NOTE — Telephone Encounter (Signed)
Awesome! Thanks!   Marchia Diguglielmo, MD [facility]  

## 2019-08-19 NOTE — Telephone Encounter (Signed)
Spoke with patient.  Moved to [place] last week, prior to realizing the severity of her cardiac symptoms and the need for further testing. Her Medicaid, Medicare and disability benefits are still in [place]. She has a young daughter and the people that can help her are in [place]. She is continuing to have cardiac symptoms and realized she needs to move back to [place].  Her apartment complex is needing a letter stating the urgency of the situation. Needs to state that it is an urgent medical emergency for patient and she need further testing on her heart.  Stating she needs to move back to [facility] in [place]. It needs to be emailed to [email or web address]  Advised I will route to Dr Rosezella [place] nurse and make her aware of the urgency.

## 2019-08-19 NOTE — Telephone Encounter (Signed)
New message:    Patient calling she need a note stating why it is important for her to back here to get her heart monitor by Dr. Percival Spanish. Patient states that it is very urgent. Please call patient.

## 2019-08-19 NOTE — Telephone Encounter (Signed)
I cannot report that she has an emergent cardiac condition in the way she is requesting.  She has PVCs, PACs with medical management.  She has not has any sustained arrhythmias that would constitute a medical emergency.

## 2019-08-19 NOTE — Telephone Encounter (Signed)
Patient calling to check on status  Please call to discuss ASAP

## 2019-08-19 NOTE — Telephone Encounter (Signed)
Spoke with patient and she is actually feeling worse than before, Propranolol not helping. Feels heart rate is higher and skipping more.  She has no medical or drug coverage anywhere but [place]. She would like to come back to [place] where she can continue to get her heart evaluated as she feels something is not right. I did explain that what has been found so far is not anything Dr Percival Spanish feels is emergent to do a letter. She is very upset and feels that she needs to get back to [facility] so she can get her heart checked out more. Explained to patient when she left the office 8/31 Dr Percival Spanish he recommended a 2 month follow up and Echo and was sorry she chose to move. I did let her know I would reach out to Dr Percival Spanish to see if he could do any kind of letter but not sure if he would. Also explained if a letter was done it would not say she had to come back emergently. I did try to give her reassurance regarding PVC's/PAC's Will forward to Dr Percival Spanish for review.

## 2019-08-19 NOTE — Telephone Encounter (Signed)
Pt's insurance no longer covers mometasone 50 mcg- covered alternatives are azelastine, flonase, and flunisolide. Please advise

## 2019-08-20 ENCOUNTER — Telehealth: Payer: Self-pay | Admitting: Cardiology

## 2019-08-20 MED ORDER — FLUNISOLIDE 25 MCG/ACT (0.025%) NA SOLN: 2.0000 | Freq: Every day | NASAL | 5 refills | Status: DC

## 2019-08-20 NOTE — Telephone Encounter (Signed)
Spoke with patient and she does need anything is on her way back to [place].

## 2019-08-20 NOTE — Telephone Encounter (Signed)
See phone note 9/9

## 2019-08-20 NOTE — Addendum Note (Signed)
Addended by: Katherina Right D on: 08/20/2019 09:12 AM   Modules accepted: Orders

## 2019-08-20 NOTE — Telephone Encounter (Signed)
Pt informed of change

## 2019-08-20 NOTE — Telephone Encounter (Signed)
Flunisolide two sprays per nostril daily.  Salvatore Marvel, MD [facility]

## 2019-08-20 NOTE — Telephone Encounter (Signed)
We can send a letter that says that she has PACs and PVCs.

## 2019-08-20 NOTE — Telephone Encounter (Signed)
New Message:    Pt wants to know if we have a patient care coordinator. She wants somebody to follow her treatment plan and tell her what she needs to do next.

## 2019-08-27 ENCOUNTER — Other Ambulatory Visit: Payer: Self-pay

## 2019-08-27 ENCOUNTER — Ambulatory Visit (HOSPITAL_COMMUNITY): Payer: Medicare Other | Attending: Cardiovascular Disease

## 2019-08-27 DIAGNOSIS — I493 Ventricular premature depolarization: Secondary | ICD-10-CM | POA: Diagnosis not present

## 2019-08-27 DIAGNOSIS — R002 Palpitations: Secondary | ICD-10-CM | POA: Insufficient documentation

## 2019-08-27 DIAGNOSIS — R000 Tachycardia, unspecified: Secondary | ICD-10-CM | POA: Insufficient documentation

## 2019-08-27 DIAGNOSIS — I441 Atrioventricular block, second degree: Secondary | ICD-10-CM | POA: Insufficient documentation

## 2019-08-31 ENCOUNTER — Telehealth: Payer: Self-pay | Admitting: Cardiology

## 2019-08-31 ENCOUNTER — Telehealth: Payer: Self-pay | Admitting: Family Medicine

## 2019-08-31 NOTE — Telephone Encounter (Signed)
Made pt an appt.  

## 2019-08-31 NOTE — Telephone Encounter (Signed)
Message sent to [place]

## 2019-08-31 NOTE — Telephone Encounter (Signed)
Returned call to pt she states that she started to take the propranolol and she states that it made her palpitations worse so she took only took the one dose and "knew from the increased palpitations that she should not take anymore" she states that it make her also "feel horrible" she states that her palpitations are still worse. She had a "gene test" done at her psychiatrist and propranolol is one on there that she should not take. She states she will forward this gene test for your review via mychart. What should she do now, do you want to add a medication? Please advise

## 2019-08-31 NOTE — Telephone Encounter (Signed)
I think it will be very difficult to adjust her meds over the phone.  She probably needs a visit that could be virtual if she prefers.

## 2019-08-31 NOTE — Telephone Encounter (Signed)
Advised patient and scheduled visit for Thursday morning

## 2019-08-31 NOTE — Telephone Encounter (Signed)
New Message:     Pt wants Dr Percival Spanish to know the Propranolol is not helping, she says it is making it worse.

## 2019-09-02 ENCOUNTER — Telehealth: Payer: Self-pay | Admitting: Cardiology

## 2019-09-02 DIAGNOSIS — R0789 Other chest pain: Secondary | ICD-10-CM

## 2019-09-02 DIAGNOSIS — R002 Palpitations: Secondary | ICD-10-CM

## 2019-09-02 NOTE — Progress Notes (Signed)
Virtual Visit via Video Note   This visit type was conducted due to national recommendations for restrictions regarding the COVID-19 Pandemic (e.g. social distancing) in an effort to limit this patient's exposure and mitigate transmission in our community.  Due to her co-morbid illnesses, this patient is at least at moderate risk for complications without adequate follow up.  This format is felt to be most appropriate for this patient at this time.  All issues noted in this document were discussed and addressed.  A limited physical exam was performed with this format.  Please refer to the patient's chart for her consent to telehealth for [facility].   Date:  09/03/2019   ID:  Stephanie Serrano, DOB Apr 02, 1966, MRN [id number]  Patient Location: Home Provider Location: Home  PCP:  Azzie Glatter, FNP  Cardiologist:  Minus Breeding, MD  Electrophysiologist:  None   Evaluation Performed:  Follow-Up Visit  Chief Complaint:  Palpitations  History of Present Illness:    Stephanie Serrano is a 53 y.o. female who presents for evaluation of tachycardia .   She had palpitations and wore a Holter.   This demonstrated Mobitz Type I and rare SVEs and PVCs.  She had continued symptoms when I saw her back in the office.   She had a normal echo.  I tried a low dose of propranolol.  However, she called with continued symptoms.   She reports that she could not take the propranolol.  It caused many symptoms.  She had tightness in the chest.  She had cough.  She took her albuterol without relief.  She had increased urination.  She gets many of these symptoms at night and it interupts her sleep.  She took herself off of all of her meds essentially.  She did send me labs.  She has a markedly elevated LDL and total cholesterol.  She had normal electrolytes.  She did send me a gene study which actually demonstrates that she has a potential negative predisposition to the side effects of propranolol.    The patient  does not have symptoms concerning for COVID-19 infection (fever, chills, cough, or new shortness of breath).  Past Medical History:  Diagnosis Date  . Allergy   . Anemia   . Anxiety   . Arthritis   . Asthma   . Blood transfusion without reported diagnosis   . Depression   . Eczema   . Esophageal stricture   . GERD (gastroesophageal reflux disease)   . Migraines   . OCD (obsessive compulsive disorder)   . RLS (restless legs syndrome)   . Vertigo    Past Surgical History:  Procedure Laterality Date  . ESOPHAGOGASTRODUODENOSCOPY N/A 02/09/2015   Procedure: ESOPHAGOGASTRODUODENOSCOPY (EGD);  Surgeon: Inda Castle, MD;  Location: [facility];  Service: Endoscopy;  Laterality: N/A;  with dilation  . FOOT SURGERY     bone spurs  . NASAL SINUS SURGERY    . PLANTAR FASCIA RELEASE Right 08/03/2016   Procedure: PLANTAR FASCIA RELEASE WITH BONE SPUR EXCISION;  Surgeon: Dorna Leitz, MD;  Location: [facility];  Service: Orthopedics;  Laterality: Right;  . TONSILLECTOMY       No outpatient medications have been marked as taking for the 09/03/19 encounter (Telemedicine) with Minus Breeding, MD.   Current Facility-Administered Medications for the 09/03/19 encounter (Telemedicine) with Minus Breeding, MD  Medication  . dupilumab (DUPIXENT) prefilled syringe 300 mg     Allergies:   Azithromycin, Clindamycin/lincomycin, Doxycycline, Penicillins, Propranolol, Sulfa  antibiotics, Sulfasalazine, Vicodin [hydrocodone-acetaminophen], and Codeine   Social History   Tobacco Use  . Smoking status: Never Smoker  . Smokeless tobacco: Never Used  Substance Use Topics  . Alcohol use: Yes    Comment: socially  . Drug use: No     Family Hx: The patient's family history includes Allergic rhinitis in her father and mother; Anxiety disorder in her brother; Asthma in her father; Cancer in an other family member; Colon cancer in her maternal grandmother; Diabetes in her mother and  paternal grandmother; Heart disease in her mother; Pancreatic cancer in her father; Stomach cancer in her father. There is no history of Esophageal cancer, Eczema, Urticaria, Liver cancer, or Rectal cancer.  ROS:   Please see the history of present illness.     All other systems reviewed and are negative.   Prior CV studies:   The following studies were reviewed today:  Labs  Labs/Other Tests and Data Reviewed:    EKG:  No ECG reviewed.  Recent Labs: 08/03/2019: ALT 17; BUN 8; Creatinine, Ser 0.85; Hemoglobin 14.2; Platelets 298; Potassium 4.5; Sodium 139; TSH 1.250   Recent Lipid Panel Lab Results  Component Value Date/Time   CHOL 310 (H) 08/03/2019 02:44 PM   TRIG 67 08/03/2019 02:44 PM   HDL 77 08/03/2019 02:44 PM   CHOLHDL 4.0 08/03/2019 02:44 PM   CHOLHDL 3.9 10/26/2016 09:01 AM   LDLCALC 220 (H) 08/03/2019 02:44 PM    Wt Readings from Last 3 Encounters:  09/03/19 147 lb (66.7 kg)  08/10/19 155 lb 6.4 oz (70.5 kg)  08/03/19 154 lb (69.9 kg)     Objective:    Vital Signs:  Ht 5\' 2"  (1.575 m)   Wt 147 lb (66.7 kg)   LMP 01/17/2017   BMI 26.89 kg/m    VITAL SIGNS:  reviewed GEN:  no acute distress EYES:  sclerae anicteric, EOMI - Extraocular Movements Intact PSYCH:  normal affect  ASSESSMENT & PLAN:    PALPITATIONS:   She is intolerant of propranolol so I will try Cardizem IR prn.    Of note she had a low normal EF on echo.  There were no other significant abnormalities.  No change in therapy.    DYSLIPIDEMIA:  LDL is markedly elevated as above.  She thinks that this could have been related to some of the meds she was taking.  She has stopped these and is going to have this repeated.  However, I looked back over four years and the lipids have been elevated consistently.  She is going to get a coronary calcium score and this will help [place] talk about goals of therapy.    INSOMNIA:  We talked about this and    COVID-19 Education: We have talked about COVID  education  Time:   Today, I have spent 25 minutes with the patient with telehealth technology discussing the above problems.     Medication Adjustments/Labs and Tests Ordered: Current medicines are reviewed at length with the patient today.  Concerns regarding medicines are outlined above.   Tests Ordered: No orders of the defined types were placed in this encounter.   Medication Changes: No orders of the defined types were placed in this encounter.   Follow Up:  In Person in two months with APP.   Signed, Minus Breeding, MD  09/03/2019 10:29 AM    [facility]

## 2019-09-02 NOTE — Telephone Encounter (Signed)
New Message    Patient states she has questions about virtual visit scheduled for 09/03/19. Also she needs a medical note about her heart problems she's having.  Please call patient back to discuss.

## 2019-09-02 NOTE — Telephone Encounter (Signed)
Patient needs the letter written for her- explaining her process of her health condition as she needs something to explain what has been going on with her.  Patient is upset on the phone, crying about her health and home situation. I explained to her to keep the appointment for tomorrow to discuss her issues- patient states shes not able to check her BP and did not know if they would be an issue, explained to her it would not- and speaking with the doctor will help the situation.  Patient verbalized understanding, advised I would route message over to [facility] regarding the letter.

## 2019-09-03 ENCOUNTER — Encounter: Payer: Self-pay | Admitting: Cardiology

## 2019-09-03 ENCOUNTER — Telehealth (INDEPENDENT_AMBULATORY_CARE_PROVIDER_SITE_OTHER): Payer: Medicare Other | Admitting: Cardiology

## 2019-09-03 DIAGNOSIS — R072 Precordial pain: Secondary | ICD-10-CM

## 2019-09-03 MED ORDER — DILTIAZEM HCL 30 MG PO TABS: 30.0000 mg | ORAL_TABLET | Freq: Two times a day (BID) | ORAL | 3 refills | Status: DC | PRN

## 2019-09-03 NOTE — Telephone Encounter (Signed)
Patient had visit with Dr Percival Spanish today

## 2019-09-03 NOTE — Telephone Encounter (Signed)
OK.    She has palpitations and is being treated with meds for symptomatic improvement.  She has dyslipidemia and is getting further testing to determine goals of therapy.

## 2019-09-03 NOTE — Telephone Encounter (Signed)
° ° °  Patient calling back, request letter be sent to her email on file, outlining her diagnosis and stating she has additional testing ordered

## 2019-09-03 NOTE — Patient Instructions (Addendum)
Medication Instructions:  Start taking Cardizem 30mg  twice daily as needed.  Stop taking Propranolol.   If you need a refill on your cardiac medications before your next appointment, please call your pharmacy.   Lab work: NONE  Testing/Procedures: Calcium Score - This will cost you $150 out of pocket.   Follow-Up: At [facility], you and your health needs are our priority.  As part of our continuing mission to provide you with exceptional heart care, we have created designated Provider Care Teams.  These Care Teams include your primary Cardiologist (physician) and Advanced Practice Providers (APPs -  Physician Assistants and Nurse Practitioners) who all work together to provide you with the care you need, when you need it.

## 2019-09-04 ENCOUNTER — Ambulatory Visit (INDEPENDENT_AMBULATORY_CARE_PROVIDER_SITE_OTHER): Payer: Medicare Other | Admitting: Family Medicine

## 2019-09-04 ENCOUNTER — Telehealth: Payer: Self-pay | Admitting: Cardiology

## 2019-09-04 ENCOUNTER — Other Ambulatory Visit: Payer: Self-pay

## 2019-09-04 VITALS — BP 144/89 | HR 97 | Temp 98.2°F | Resp 16 | Ht 62.0 in | Wt 145.0 lb

## 2019-09-04 DIAGNOSIS — R002 Palpitations: Secondary | ICD-10-CM | POA: Diagnosis not present

## 2019-09-04 DIAGNOSIS — I441 Atrioventricular block, second degree: Secondary | ICD-10-CM | POA: Diagnosis not present

## 2019-09-04 DIAGNOSIS — F419 Anxiety disorder, unspecified: Secondary | ICD-10-CM

## 2019-09-04 DIAGNOSIS — Z0000 Encounter for general adult medical examination without abnormal findings: Secondary | ICD-10-CM

## 2019-09-04 DIAGNOSIS — F438 Other reactions to severe stress: Secondary | ICD-10-CM

## 2019-09-04 DIAGNOSIS — F430 Acute stress reaction: Secondary | ICD-10-CM | POA: Diagnosis not present

## 2019-09-04 DIAGNOSIS — R9431 Abnormal electrocardiogram [ECG] [EKG]: Secondary | ICD-10-CM

## 2019-09-04 DIAGNOSIS — I493 Ventricular premature depolarization: Secondary | ICD-10-CM | POA: Diagnosis not present

## 2019-09-04 DIAGNOSIS — R000 Tachycardia, unspecified: Secondary | ICD-10-CM | POA: Diagnosis not present

## 2019-09-04 DIAGNOSIS — F331 Major depressive disorder, recurrent, moderate: Secondary | ICD-10-CM

## 2019-09-04 DIAGNOSIS — R0602 Shortness of breath: Secondary | ICD-10-CM | POA: Diagnosis not present

## 2019-09-04 DIAGNOSIS — F4389 Other reactions to severe stress: Secondary | ICD-10-CM

## 2019-09-04 DIAGNOSIS — R350 Frequency of micturition: Secondary | ICD-10-CM

## 2019-09-04 DIAGNOSIS — R0789 Other chest pain: Secondary | ICD-10-CM

## 2019-09-04 DIAGNOSIS — Z09 Encounter for follow-up examination after completed treatment for conditions other than malignant neoplasm: Secondary | ICD-10-CM

## 2019-09-04 LAB — POCT URINALYSIS DIPSTICK
Bilirubin, UA: NEGATIVE
Blood, UA: NEGATIVE
Glucose, UA: NEGATIVE
Leukocytes, UA: NEGATIVE
Nitrite, UA: NEGATIVE
Protein, UA: NEGATIVE
Spec Grav, UA: 1.02 (ref 1.010–1.025)
Urobilinogen, UA: 0.2 E.U./dL
pH, UA: 7.5 (ref 5.0–8.0)

## 2019-09-04 NOTE — Telephone Encounter (Signed)
New Message  Patient is calling in to see if there is a test that compared to the calcium score that is scheduled for 10/12/19. Patient states that she has medicare and medicaid and wants another test that can be done sooner to determine what is going on with her heart. Please give patient a call back to discuss.

## 2019-09-04 NOTE — Telephone Encounter (Signed)
Returned call to patient of Dr. Percival Spanish who was evaluated via telemedicine visit 09/03/2019. She reports she would like to have a test done to see if she has heart blockages, that her calcium score screening test is scheduled for Nov and she needs a test sooner. She states the dilitazem Rx'ed yesterday is helpful but only lasts a few hours. Her palpitations are worse when lying down at night and she now has increased urination at night and during the day and also diarrhea in the AM. She states her pscyh PA will not prescribe her any medications as they affect her heart rate/rhythm. She had an EKG done today (in epic) by PCP that shows PVCs and her PCP suggested that this may be anxiety but patient states it is not. She is very insistent that she have a different test and that her symptoms are not getting better.   Will route to MD/primary to review and advise

## 2019-09-04 NOTE — Progress Notes (Signed)
Patient [facility]   Established Patient Office Visit  Subjective:  Patient ID: Stephanie Serrano, female    DOB: 1966/09/13  Age: 53 y.o. MRN: [id number]  CC:  Chief Complaint  Patient presents with  . Insomnia  . Urinary Frequency  . Anorexia  . Back Pain    tightness in back   . Palpitations    sees cardiology ( had an appointment yesterday)     HPI Stephanie Serrano is a 53 year old female who presents for Follow Up today.   Past Medical History:  Diagnosis Date  . Allergy   . Anemia   . Anxiety   . Arthritis   . Asthma   . Blood transfusion without reported diagnosis   . Depression   . Eczema   . Esophageal stricture   . GERD (gastroesophageal reflux disease)   . Migraines   . OCD (obsessive compulsive disorder)   . RLS (restless legs syndrome)   . Vertigo    Current Status: Since her last office visit, she recently relocated to [place]. She has been back in [place] for 2 weeks. Today, she has c/o heart palpitations, shortness of breath, which she states r/t beginning Latuda X 2 weeks. She has discussed this with her Psychiatrist and has since discontinued medication. Patient is very anxious today. Her explanations as to why she came back to [place] are scattered and unclear. She has recently seen her Psychiatrist on Monday. She denies suicidal ideations, homicidal ideations, or auditory hallucinations. Her last Cardiology appointment was yesterday and she was prescribed Diltiazem BID as needed for Heart Palpitations. She denies fevers, chills, fatigue, recent infections, weight loss, and night sweats. She has not had any headaches, visual changes, dizziness, and falls. No chest pain, heart palpitations, cough and shortness of breath reported. No reports of GI problems such as nausea, vomiting, diarrhea, and constipation. She has no reports of blood in stools, dysuria and hematuria. She denies pain today.   Past Surgical History:  Procedure  Laterality Date  . ESOPHAGOGASTRODUODENOSCOPY N/A 02/09/2015   Procedure: ESOPHAGOGASTRODUODENOSCOPY (EGD);  Surgeon: Inda Castle, MD;  Location: [facility];  Service: Endoscopy;  Laterality: N/A;  with dilation  . FOOT SURGERY     bone spurs  . NASAL SINUS SURGERY    . PLANTAR FASCIA RELEASE Right 08/03/2016   Procedure: PLANTAR FASCIA RELEASE WITH BONE SPUR EXCISION;  Surgeon: Dorna Leitz, MD;  Location: [facility];  Service: Orthopedics;  Laterality: Right;  . TONSILLECTOMY      Family History  Problem Relation Age of Onset  . Pancreatic cancer Father   . Allergic rhinitis Father   . Asthma Father   . Stomach cancer Father   . Diabetes Mother   . Heart disease Mother        No details   . Allergic rhinitis Mother   . Anxiety disorder Brother   . Diabetes Paternal Grandmother   . Colon cancer Maternal Grandmother   . Cancer Other   . Esophageal cancer Neg Hx   . Eczema Neg Hx   . Urticaria Neg Hx   . Liver cancer Neg Hx   . Rectal cancer Neg Hx     Social History   Socioeconomic History  . Marital status: Divorced    Spouse name: Not on file  . Number of children: 2  . Years of education: Not on file  . Highest education level: Not on file  Occupational History  .  Occupation: Special educational needs teacher: [employer]  . Financial resource strain: Not on file  . Food insecurity    Worry: Not on file    Inability: Not on file  . Transportation needs    Medical: Not on file    Non-medical: Not on file  Tobacco Use  . Smoking status: Never Smoker  . Smokeless tobacco: Never Used  Substance and Sexual Activity  . Alcohol use: Yes    Comment: socially  . Drug use: No  . Sexual activity: Never  Lifestyle  . Physical activity    Days per week: Not on file    Minutes per session: Not on file  . Stress: Not on file  Relationships  . Social Herbalist on phone: Not on file    Gets together: Not on file    Attends religious  service: Not on file    Active member of club or organization: Not on file    Attends meetings of clubs or organizations: Not on file    Relationship status: Not on file  . Intimate partner violence    Fear of current or ex partner: Not on file    Emotionally abused: Not on file    Physically abused: Not on file    Forced sexual activity: Not on file  Other Topics Concern  . Not on file  Social History Narrative   Lives with daughter.  She did lose a daughter 6 years ago.      Outpatient Medications Prior to Visit  Medication Sig Dispense Refill  . clonazePAM (KLONOPIN) 1 MG tablet Take 1 mg by mouth 2 (two) times daily.    [name] diltiazem (CARDIZEM) 30 MG tablet Take 1 tablet (30 mg total) by mouth 2 (two) times daily as needed. 60 tablet 3  . Dupilumab, Asthma, (DUPIXENT [place]) Inject into the skin.    [name] zolpidem (AMBIEN) 10 MG tablet Take 10 mg by mouth at bedtime as needed for sleep.    [name] acyclovir (ZOVIRAX) 800 MG tablet Take 1 tablet (800 mg total) by mouth 5 (five) times daily. (Patient not taking: Reported on 09/03/2019) 35 tablet 3  . albuterol (PROVENTIL HFA;VENTOLIN HFA) 108 (90 Base) MCG/ACT inhaler Inhale 2 puffs into the lungs every 6 (six) hours as needed for wheezing or shortness of breath. (Patient not taking: Reported on 09/03/2019)    . atorvastatin (LIPITOR) 10 MG tablet Take 1 tablet (10 mg total) by mouth daily. (Patient not taking: Reported on 09/03/2019) 90 tablet 1  . flunisolide (NASALIDE) 25 MCG/ACT (0.025%) SOLN Place 2 sprays into the nose daily. (Patient not taking: Reported on 09/03/2019) 25 mL 5  . levalbuterol (XOPENEX HFA) 45 MCG/ACT inhaler Inhale 2 puffs into the lungs every 6 (six) hours as needed for wheezing. (Patient not taking: Reported on 09/03/2019) 1 Inhaler 5  . levocetirizine (XYZAL) 5 MG tablet TAKE 1 TABLET BY MOUTH EVERY EVENING (Patient not taking: Reported on 09/03/2019) 30 tablet 5  . LINZESS 72 MCG capsule TAKE 1 CAPSULE BY MOUTH DAILY BEFORE  BREAKFAST (Patient not taking: Reported on 09/03/2019) 30 capsule 0  . meclizine (ANTIVERT) 25 MG tablet Take 1 tablet (25 mg total) by mouth 3 (three) times daily as needed for dizziness. (Patient not taking: Reported on 09/03/2019) 30 tablet 3  . mometasone (NASONEX) 50 MCG/ACT nasal spray Place 2 sprays into the nose daily. (Patient not taking: Reported on 09/03/2019) 17 g 5  . ondansetron (ZOFRAN)  4 MG tablet Take 4 mg by mouth every 8 (eight) hours as needed for nausea or vomiting.    . polyethylene glycol (MIRALAX) packet Take 17 g by mouth daily. (Patient not taking: Reported on 09/03/2019) 14 each 3  . PULMICORT 0.5 MG/2ML nebulizer solution Use as directed for nasal irrigation twice daiy (Patient not taking: Reported on 09/03/2019) 120 mL 5  . traZODone (DESYREL) 50 MG tablet Take 1 tablet (50 mg total) by mouth at bedtime as needed for sleep. (Patient not taking: Reported on 09/03/2019) 30 tablet 0   Facility-Administered Medications Prior to Visit  Medication Dose Route Frequency Provider Last Rate Last Dose  . dupilumab (DUPIXENT) prefilled syringe 300 mg  300 mg Subcutaneous Q14 Days Valentina Shaggy, MD   300 mg at 08/03/19 1533    Allergies  Allergen Reactions  . Azithromycin   . Clindamycin/Lincomycin   . Doxycycline     Swelling of mucous membrane, body pain  . Penicillins     Has patient had a PCN reaction causing immediate rash, facial/tongue/throat swelling, SOB or lightheadedness with hypotension: yes Has patient had a PCN reaction causing severe rash involving mucus membranes or skin necrosis: no Has patient had a PCN reaction that required hospitalization: no Has patient had a PCN reaction occurring within the last 10 years: no If all of the above answers are "NO", then may proceed with Cephalosporin use.   [name] Propranolol     Increase palpitations  . Sulfa Antibiotics   . Sulfasalazine Other (See Comments)  . Vicodin [Hydrocodone-Acetaminophen] Nausea Only  .  Codeine Anxiety, Itching and Nausea And Vomiting    ROS Review of Systems  Constitutional: Negative.   HENT: Negative.   Eyes: Negative.   Respiratory: Positive for shortness of breath (occasional).   Cardiovascular: Positive for palpitations (reports X 2 weeks).  Gastrointestinal: Negative.   Endocrine: Negative.   Genitourinary: Negative.   Musculoskeletal: Negative.   Skin: Negative.   Neurological: Positive for dizziness (occasional) and headaches (occasional).  Hematological: Negative.   Psychiatric/Behavioral: Positive for agitation and confusion. The patient is nervous/anxious.    Objective:    Physical Exam  Constitutional: She is oriented to person, place, and time. She appears well-developed and well-nourished.  HENT:  Head: Normocephalic and atraumatic.  Eyes: Conjunctivae are normal.  Neck: Normal range of motion. Neck supple.  Cardiovascular: Normal rate, regular rhythm, normal heart sounds and intact distal pulses.  Pulmonary/Chest: Effort normal and breath sounds normal.  Abdominal: Soft. Bowel sounds are normal.  Musculoskeletal: Normal range of motion.  Neurological: She is alert and oriented to person, place, and time. She has normal reflexes.  Skin: Skin is warm and dry.  Psychiatric: She has a normal mood and affect. Her behavior is normal. Judgment and thought content normal.  Nursing note and vitals reviewed.   BP (!) 144/89 (BP Location: Left Arm, Patient Position: Sitting, Cuff Size: Normal)   Pulse 97   Temp 98.2 F (36.8 C) (Oral)   Resp 16   Ht 5\' 2"  (1.575 m)   Wt 145 lb (65.8 kg)   LMP 01/17/2017   SpO2 100%   BMI 26.52 kg/m  Wt Readings from Last 3 Encounters:  09/04/19 145 lb (65.8 kg)  09/03/19 147 lb (66.7 kg)  08/10/19 155 lb 6.4 oz (70.5 kg)     Health Maintenance Due  Topic Date Due  . HIV Screening  11/02/1981    There are no preventive care reminders to display for this  patient.  Lab Results  Component Value Date    TSH 1.450 09/04/2019   Lab Results  Component Value Date   WBC 7.8 09/05/2019   HGB 13.6 09/05/2019   HCT 40.9 09/05/2019   MCV 85.6 09/05/2019   PLT 336 09/05/2019   Lab Results  Component Value Date   NA 139 09/05/2019   K 3.3 (L) 09/05/2019   CO2 20 (L) 09/05/2019   GLUCOSE 153 (H) 09/05/2019   BUN 5 (L) 09/05/2019   CREATININE 0.78 09/05/2019   BILITOT 0.7 09/05/2019   ALKPHOS 82 09/05/2019   AST 20 09/05/2019   ALT 17 09/05/2019   PROT 6.6 09/05/2019   ALBUMIN 3.9 09/05/2019   CALCIUM 9.6 09/05/2019   ANIONGAP 10 09/05/2019   Lab Results  Component Value Date   CHOL 265 (H) 09/04/2019   Lab Results  Component Value Date   HDL 68 09/04/2019   Lab Results  Component Value Date   LDLCALC 220 (H) 08/03/2019   Lab Results  Component Value Date   TRIG 69 09/04/2019   Lab Results  Component Value Date   CHOLHDL 3.9 09/04/2019   Lab Results  Component Value Date   HGBA1C 5.4 09/12/2018   Assessment & Plan:   1. MDD (major depressive disorder), recurrent episode, moderate (HCC) - CBC with Differential  2. Relocation stress syndrome  3. Heart palpitations Stable today. Reports increased while lying down. She will continue Diltiazem BID as needed.   4. Shortness of breath Occasional.   5. Chest discomfort Stable today. No signs or symptoms of respiratory distress noted or reported.  - CBC with Differential  6. Anxiety Moderate today.   7. ECG abnormal PVCs noted on ECG today. She will continue Diltiazem as prescribed. She will report to ED if symptoms worsen.   8. Urinary frequency We will continue to monitor.  - Urinalysis Dipstick  9. Healthcare maintenance - Comprehensive metabolic panel; Future - Lipid Panel - TSH - Comprehensive metabolic panel  10. Follow up She will follow up in 1 month.   No orders of the defined types were placed in this encounter.  Orders Placed This Encounter  Procedures  . CBC with Differential  .  Comprehensive metabolic panel  . Lipid Panel  . TSH  . Urinalysis Dipstick   Referral Orders  No referral(s) requested today    Kathe Becton,  MSN, FNP-BC [address] [place], [place] [address] [phone] fax    Problem List Items Addressed This Visit      Other   MDD (major depressive disorder), recurrent episode, moderate ([place]) - Primary   Relevant Orders   CBC with Differential (Completed)    Other Visit Diagnoses    Relocation stress syndrome       Heart palpitations       Shortness of breath       Chest discomfort       Relevant Orders   CBC with Differential (Completed)   Anxiety       ECG abnormal       Urinary frequency       Relevant Orders   Urinalysis Dipstick (Completed)   Healthcare maintenance       Relevant Orders   Comprehensive metabolic panel (Completed)   Lipid Panel (Completed)   TSH (Completed)   Follow up          No orders of the defined types  were placed in this encounter.   Follow-up: Return in about 1 month (around 10/04/2019).    Azzie Glatter, FNP

## 2019-09-05 ENCOUNTER — Encounter (HOSPITAL_COMMUNITY): Payer: Self-pay | Admitting: *Deleted

## 2019-09-05 ENCOUNTER — Emergency Department (HOSPITAL_COMMUNITY)
Admission: EM | Admit: 2019-09-05 | Discharge: 2019-09-05 | Disposition: A | Discharge status: Home / Self Care | Payer: Medicare Other | Attending: Emergency Medicine | Admitting: Emergency Medicine

## 2019-09-05 ENCOUNTER — Emergency Department (HOSPITAL_COMMUNITY): Payer: Medicare Other

## 2019-09-05 ENCOUNTER — Other Ambulatory Visit: Payer: Self-pay

## 2019-09-05 DIAGNOSIS — Z79899 Other long term (current) drug therapy: Secondary | ICD-10-CM | POA: Diagnosis not present

## 2019-09-05 DIAGNOSIS — R0789 Other chest pain: Secondary | ICD-10-CM

## 2019-09-05 DIAGNOSIS — R002 Palpitations: Secondary | ICD-10-CM

## 2019-09-05 DIAGNOSIS — R1084 Generalized abdominal pain: Secondary | ICD-10-CM | POA: Diagnosis not present

## 2019-09-05 DIAGNOSIS — R0602 Shortness of breath: Secondary | ICD-10-CM | POA: Insufficient documentation

## 2019-09-05 DIAGNOSIS — F419 Anxiety disorder, unspecified: Secondary | ICD-10-CM | POA: Insufficient documentation

## 2019-09-05 LAB — CBC
HCT: 40.9 % (ref 36.0–46.0)
Hemoglobin: 13.6 g/dL (ref 12.0–15.0)
MCH: 28.5 pg (ref 26.0–34.0)
MCHC: 33.3 g/dL (ref 30.0–36.0)
MCV: 85.6 fL (ref 80.0–100.0)
Platelets: 336 10*3/uL (ref 150–400)
RBC: 4.78 MIL/uL (ref 3.87–5.11)
RDW: 14.6 % (ref 11.5–15.5)
WBC: 7.8 10*3/uL (ref 4.0–10.5)
nRBC: 0 % (ref 0.0–0.2)

## 2019-09-05 LAB — LIPID PANEL
Chol/HDL Ratio: 3.9 ratio (ref 0.0–4.4)
Cholesterol, Total: 265 mg/dL — ABNORMAL HIGH (ref 100–199)
HDL: 68 mg/dL (ref >39)
LDL Chol Calc (NIH): 186 mg/dL — ABNORMAL HIGH (ref 0–99)
Triglycerides: 69 mg/dL (ref 0–149)
VLDL Cholesterol Cal: 11 mg/dL (ref 5–40)

## 2019-09-05 LAB — COMPREHENSIVE METABOLIC PANEL
ALT: 12 IU/L (ref 0–32)
AST: 18 IU/L (ref 0–40)
Albumin/Globulin Ratio: 1.9 (ref 1.2–2.2)
Albumin: 4.4 g/dL (ref 3.8–4.9)
Alkaline Phosphatase: 108 IU/L (ref 39–117)
BUN/Creatinine Ratio: 8 — ABNORMAL LOW (ref 9–23)
BUN: 7 mg/dL (ref 6–24)
Bilirubin Total: 0.3 mg/dL (ref 0.0–1.2)
CO2: 20 mmol/L (ref 20–29)
Calcium: 9.5 mg/dL (ref 8.7–10.2)
Chloride: 107 mmol/L — ABNORMAL HIGH (ref 96–106)
Creatinine, Ser: 0.83 mg/dL (ref 0.57–1.00)
GFR calc Af Amer: 94 mL/min/{1.73_m2} (ref >59)
GFR calc non Af Amer: 81 mL/min/{1.73_m2} (ref >59)
Globulin, Total: 2.3 g/dL (ref 1.5–4.5)
Glucose: 110 mg/dL — ABNORMAL HIGH (ref 65–99)
Potassium: 4.3 mmol/L (ref 3.5–5.2)
Sodium: 142 mmol/L (ref 134–144)
Total Protein: 6.7 g/dL (ref 6.0–8.5)

## 2019-09-05 LAB — CBC WITH DIFFERENTIAL/PLATELET
Basophils Absolute: 0.1 10*3/uL (ref 0.0–0.2)
Basos: 1 %
EOS (ABSOLUTE): 0 10*3/uL (ref 0.0–0.4)
Eos: 1 %
Hematocrit: 41.1 % (ref 34.0–46.6)
Hemoglobin: 13.7 g/dL (ref 11.1–15.9)
Immature Grans (Abs): 0 10*3/uL (ref 0.0–0.1)
Immature Granulocytes: 0 %
Lymphocytes Absolute: 1.9 10*3/uL (ref 0.7–3.1)
Lymphs: 33 %
MCH: 27.8 pg (ref 26.6–33.0)
MCHC: 33.3 g/dL (ref 31.5–35.7)
MCV: 84 fL (ref 79–97)
Monocytes Absolute: 0.4 10*3/uL (ref 0.1–0.9)
Monocytes: 7 %
Neutrophils Absolute: 3.3 10*3/uL (ref 1.4–7.0)
Neutrophils: 58 %
Platelets: 285 10*3/uL (ref 150–450)
RBC: 4.92 x10E6/uL (ref 3.77–5.28)
RDW: 14.5 % (ref 11.7–15.4)
WBC: 5.6 10*3/uL (ref 3.4–10.8)

## 2019-09-05 LAB — BASIC METABOLIC PANEL
Anion gap: 10 (ref 5–15)
BUN: 5 mg/dL — ABNORMAL LOW (ref 6–20)
CO2: 20 mmol/L — ABNORMAL LOW (ref 22–32)
Calcium: 9.6 mg/dL (ref 8.9–10.3)
Chloride: 109 mmol/L (ref 98–111)
Creatinine, Ser: 0.78 mg/dL (ref 0.44–1.00)
GFR calc Af Amer: >60 mL/min (ref >60)
GFR calc non Af Amer: >60 mL/min (ref >60)
Glucose, Bld: 153 mg/dL — ABNORMAL HIGH (ref 70–99)
Potassium: 3.3 mmol/L — ABNORMAL LOW (ref 3.5–5.1)
Sodium: 139 mmol/L (ref 135–145)

## 2019-09-05 LAB — URINALYSIS, ROUTINE W REFLEX MICROSCOPIC
Bilirubin Urine: NEGATIVE
Glucose, UA: NEGATIVE mg/dL
Hgb urine dipstick: NEGATIVE
Ketones, ur: NEGATIVE mg/dL
Leukocytes,Ua: NEGATIVE
Nitrite: NEGATIVE
Protein, ur: NEGATIVE mg/dL
Specific Gravity, Urine: 1.002 — ABNORMAL LOW (ref 1.005–1.030)
pH: 9 — ABNORMAL HIGH (ref 5.0–8.0)

## 2019-09-05 LAB — HEPATIC FUNCTION PANEL
ALT: 17 U/L (ref 0–44)
AST: 20 U/L (ref 15–41)
Albumin: 3.9 g/dL (ref 3.5–5.0)
Alkaline Phosphatase: 82 U/L (ref 38–126)
Bilirubin, Direct: 0.1 mg/dL (ref 0.0–0.2)
Indirect Bilirubin: 0.6 mg/dL (ref 0.3–0.9)
Total Bilirubin: 0.7 mg/dL (ref 0.3–1.2)
Total Protein: 6.6 g/dL (ref 6.5–8.1)

## 2019-09-05 LAB — I-STAT BETA HCG BLOOD, ED (MC, WL, AP ONLY): I-stat hCG, quantitative: <5 m[IU]/mL (ref <5)

## 2019-09-05 LAB — TSH: TSH: 1.45 u[IU]/mL (ref 0.450–4.500)

## 2019-09-05 LAB — TROPONIN I (HIGH SENSITIVITY): Troponin I (High Sensitivity): <2 ng/L (ref <18)

## 2019-09-05 LAB — CK: Total CK: 178 U/L (ref 38–234)

## 2019-09-05 LAB — D-DIMER, QUANTITATIVE: D-Dimer, Quant: <0.27 ug/mL-FEU (ref 0.00–0.50)

## 2019-09-05 MED ORDER — IOHEXOL 300 MG/ML  SOLN
100.0000 mL | Freq: Once | INTRAMUSCULAR | Status: AC | PRN
  Administered 2019-09-05: 100 mL via INTRAVENOUS

## 2019-09-05 MED ORDER — ONDANSETRON HCL 4 MG/2ML IJ SOLN
4.0000 mg | Freq: Once | INTRAMUSCULAR | Status: AC
  Administered 2019-09-05: 10:00:00 4 mg via INTRAVENOUS
  Filled 2019-09-05: qty 2

## 2019-09-05 MED ORDER — SODIUM CHLORIDE 0.9% FLUSH
3.0000 mL | Freq: Once | INTRAVENOUS | Status: AC
  Administered 2019-09-05: 3 mL via INTRAVENOUS

## 2019-09-05 MED ORDER — POTASSIUM CHLORIDE CRYS ER 20 MEQ PO TBCR
20.0000 meq | EXTENDED_RELEASE_TABLET | Freq: Once | ORAL | Status: AC
  Administered 2019-09-05: 20 meq via ORAL
  Filled 2019-09-05: qty 1

## 2019-09-05 MED ORDER — MORPHINE SULFATE (PF) 4 MG/ML IV SOLN
4.0000 mg | Freq: Once | INTRAVENOUS | Status: AC
  Administered 2019-09-05: 4 mg via INTRAVENOUS
  Filled 2019-09-05: qty 1

## 2019-09-05 MED ORDER — SODIUM CHLORIDE 0.9 % IV BOLUS
1000.0000 mL | Freq: Once | INTRAVENOUS | Status: AC
  Administered 2019-09-05: 1000 mL via INTRAVENOUS

## 2019-09-05 NOTE — Discharge Instructions (Signed)
As we discussed today, your work-up was reassuring.  You need to follow-up with your primary care doctor.  You also need to follow-up with cardiology.  Please call their office and arrange for an appointment.  Continue taking the diltiazem as directed for palpitations.  Return the emergency department for any fever, difficulty breathing, vomiting or any other worsening or concerning symptoms.

## 2019-09-05 NOTE — ED Notes (Signed)
Pt returned from xray

## 2019-09-05 NOTE — ED Triage Notes (Signed)
States she started with a new drug in July was taken off medication July 31, states she  Started having  Heart issues since.

## 2019-09-05 NOTE — ED Notes (Signed)
Patient transported to CT 

## 2019-09-05 NOTE — ED Provider Notes (Addendum)
[place] [facility] EMERGENCY DEPARTMENT Provider Note   CSN: [id number] Arrival date & time: 09/05/19  0847     History   Chief Complaint Chief Complaint  Patient presents with   Chest Pain    HPI Stephanie Serrano is a 53 y.o. female past medical history of anemia, anxiety, asthma, depression, GERD, migraines, OCD, restless leg syndrome who presents for evaluation of chest pain and palpitations that have been ongoing since July 2020.  She reports that symptoms began after she did a 10-day course of Latuda.  She states that she started having palpitations and called her doctor to get off the medication.  She reports that despite being off the medications, she is still had episodes of palpitations and chest pressure.  She states that these episodes are random and occur intermittently.  She has been seen by both her PCP and by cardiology (Dr. Percival Spanish).  He initially prescribed some low-dose propanolol but she was unable to tolerate and so he switched to as needed diltiazem.  She comes in today because she states symptoms have continued to persist and have worsened.  She states that the palpitations are worse at night when she tries to lay down.  She reports some associated chest pressure/pain.  No associated fevers, cough.  She does report over the last 2 months, she has had frequent urination.  She feels like over the last week, she has had urinate more frequently. No dysuria, hematuria.  She notes that over the last 2 to 3 days, she has had some lower back pain and has had several episodes of vomiting.  She also reports that she feels like she is having cramping pain everywhere.  Most notably, in her legs.  Denies any fevers.  She is not a current smoker denies any illicit drug use.  She denies any increase in caffeine.  Denies any personal cardiac history.  Denies any family history of MIs before the age of 26. She denies any OCP use, recent immobilization, prior history of DVT/PE, recent  surgery, leg swelling, or long travel.     The history is provided by the patient.    Past Medical History:  Diagnosis Date   Allergy    Anemia    Anxiety    Arthritis    Asthma    Blood transfusion without reported diagnosis    Depression    Eczema    Esophageal stricture    GERD (gastroesophageal reflux disease)    Migraines    OCD (obsessive compulsive disorder)    RLS (restless legs syndrome)    Vertigo     Patient Active Problem List   Diagnosis Date Noted   Tachycardia 07/23/2019   Sprain of right ankle 02/03/2019   MDD (major depressive disorder), recurrent episode, moderate ([place]) 06/28/2017   OCD (obsessive compulsive disorder) 03/26/2017   Obstructive sleep apnea of adult 01/18/2017   Insomnia 02/08/2016   Heel spur 09/01/2015   Family history of cystitis 08/31/2015   Left foot pain 08/31/2015   Prediabetes 08/23/2015   Hyperlipidemia 08/23/2015   Depression 03/21/2015   Acute esophagitis 02/09/2015   Gastroesophageal reflux disease without esophagitis 08/27/2014   Dysphagia, unspecified(787.20) 03/22/2014    Past Surgical History:  Procedure Laterality Date   ESOPHAGOGASTRODUODENOSCOPY N/A 02/09/2015   Procedure: ESOPHAGOGASTRODUODENOSCOPY (EGD);  Surgeon: Inda Castle, MD;  Location: [facility];  Service: Endoscopy;  Laterality: N/A;  with dilation   FOOT SURGERY     bone spurs   NASAL SINUS SURGERY  PLANTAR FASCIA RELEASE Right 08/03/2016   Procedure: PLANTAR FASCIA RELEASE WITH BONE SPUR EXCISION;  Surgeon: Dorna Leitz, MD;  Location: [facility];  Service: Orthopedics;  Laterality: Right;   TONSILLECTOMY       OB History   No obstetric history on file.      Home Medications    Prior to Admission medications   Medication Sig Start Date End Date Taking? Authorizing Provider  acyclovir (ZOVIRAX) 800 MG tablet Take 1 tablet (800 mg total) by mouth 5 (five) times daily. Patient not taking:  Reported on 09/03/2019 08/03/19   Azzie Glatter, FNP  albuterol (PROVENTIL HFA;VENTOLIN HFA) 108 [phone] Base) MCG/ACT inhaler Inhale 2 puffs into the lungs every 6 (six) hours as needed for wheezing or shortness of breath. Patient not taking: Reported on 09/03/2019 07/02/17   Lindell Spar I, NP  atorvastatin (LIPITOR) 10 MG tablet Take 1 tablet (10 mg total) by mouth daily. Patient not taking: Reported on 09/03/2019 08/06/19   Azzie Glatter, FNP  clonazePAM (KLONOPIN) 1 MG tablet Take 1 mg by mouth 2 (two) times daily.    [provider]  diltiazem (CARDIZEM) 30 MG tablet Take 1 tablet (30 mg total) by mouth 2 (two) times daily as needed. 09/03/19   Minus Breeding, MD  Dupilumab, Asthma, (DUPIXENT [place]) Inject into the skin.    [provider]  flunisolide (NASALIDE) 25 MCG/ACT (0.025%) SOLN Place 2 sprays into the nose daily. Patient not taking: Reported on 09/03/2019 08/20/19   Valentina Shaggy, MD  levalbuterol [facility] HFA) 45 MCG/ACT inhaler Inhale 2 puffs into the lungs every 6 (six) hours as needed for wheezing. Patient not taking: Reported on 09/03/2019 08/03/19   Valentina Shaggy, MD  levocetirizine (XYZAL) 5 MG tablet TAKE 1 TABLET BY MOUTH EVERY EVENING Patient not taking: Reported on 09/03/2019 10/27/18   Valentina Shaggy, MD  LINZESS 72 MCG capsule TAKE 1 CAPSULE BY MOUTH DAILY BEFORE BREAKFAST Patient not taking: Reported on 09/03/2019 10/21/17   Scot Jun, FNP  meclizine (ANTIVERT) 25 MG tablet Take 1 tablet (25 mg total) by mouth 3 (three) times daily as needed for dizziness. Patient not taking: Reported on 09/03/2019 02/02/19   Azzie Glatter, FNP  mometasone (NASONEX) 50 MCG/ACT nasal spray Place 2 sprays into the nose daily. Patient not taking: Reported on 09/03/2019 08/03/19   Valentina Shaggy, MD  ondansetron (ZOFRAN) 4 MG tablet Take 4 mg by mouth every 8 (eight) hours as needed for nausea or vomiting.    [provider]    polyethylene glycol (MIRALAX) packet Take 17 g by mouth daily. Patient not taking: Reported on 09/03/2019 02/02/19   Azzie Glatter, FNP  PULMICORT 0.5 MG/2ML nebulizer solution Use as directed for nasal irrigation twice daiy Patient not taking: Reported on 09/03/2019 03/13/18   Valentina Shaggy, MD  traZODone (DESYREL) 50 MG tablet Take 1 tablet (50 mg total) by mouth at bedtime as needed for sleep. Patient not taking: Reported on 09/03/2019 07/02/17   Lindell Spar I, NP  zolpidem (AMBIEN) 10 MG tablet Take 10 mg by mouth at bedtime as needed for sleep.    [provider]    Family History Family History  Problem Relation Age of Onset   Pancreatic cancer Father    Allergic rhinitis Father    Asthma Father    Stomach cancer Father    Diabetes Mother    Heart disease Mother  No details    Allergic rhinitis Mother    Anxiety disorder Brother    Diabetes Paternal Grandmother    Colon cancer Maternal Grandmother    Cancer Other    Esophageal cancer Neg Hx    Eczema Neg Hx    Urticaria Neg Hx    Liver cancer Neg Hx    Rectal cancer Neg Hx     Social History Social History   Tobacco Use   Smoking status: Never Smoker   Smokeless tobacco: Never Used  Substance Use Topics   Alcohol use: Yes    Comment: socially   Drug use: No     Allergies   Azithromycin, Clindamycin/lincomycin, Doxycycline, Penicillins, Propranolol, Sulfa antibiotics, Sulfasalazine, Vicodin [hydrocodone-acetaminophen], and Codeine   Review of Systems Review of Systems  Constitutional: Negative for fever.  Respiratory: Negative for cough and shortness of breath.   Cardiovascular: Positive for chest pain and palpitations. Negative for leg swelling.  Gastrointestinal: Positive for abdominal pain and nausea.  Genitourinary: Positive for frequency. Negative for dysuria and hematuria.  Neurological: Negative for headaches.  All other systems reviewed and are  negative.    Physical Exam Updated Vital Signs BP 135/83    Pulse 68    Temp 98.6 F (37 C) (Oral)    Resp 17    LMP 01/17/2017    SpO2 100%   Physical Exam Vitals signs and nursing note reviewed.  Constitutional:      Appearance: Normal appearance. She is well-developed.     Comments: Very anxious, tearful  HENT:     Head: Normocephalic and atraumatic.  Eyes:     General: Lids are normal.     Conjunctiva/sclera: Conjunctivae normal.     Pupils: Pupils are equal, round, and reactive to light.  Neck:     Musculoskeletal: Full passive range of motion without pain.  Cardiovascular:     Rate and Rhythm: Normal rate and regular rhythm.     Pulses: Normal pulses.          Radial pulses are 2+ on the right side and 2+ on the left side.       Dorsalis pedis pulses are 2+ on the right side and 2+ on the left side.     Heart sounds: Normal heart sounds. No murmur. No friction rub. No gallop.   Pulmonary:     Effort: Pulmonary effort is normal.     Breath sounds: Normal breath sounds.     Comments: Lungs clear to auscultation bilaterally.  Symmetric chest rise.  No wheezing, rales, rhonchi.  Speaking in full sentences without any difficulty. Abdominal:     Palpations: Abdomen is soft. Abdomen is not rigid.     Tenderness: There is no abdominal tenderness. There is right CVA tenderness. There is no guarding. Negative signs include McBurney's sign.     Comments: Abdomen is soft, non-distended, non-tender. No rigidity, No guarding. No peritoneal signs.  Diffuse tenderness and no focal point.  I am able to palpate McBurney's point tenderness no signs of distress and she continues talking when I palpate this area.  He does have some mild right-sided CVA tenderness.  Musculoskeletal: Normal range of motion.     Comments: Bilateral lower extremities are symmetric in appearance without any overlying warmth, erythema, edema.  Skin:    General: Skin is warm and dry.     Capillary Refill: Capillary  refill takes less than 2 seconds.     Comments: Good distal cap refill. BUE is not  dusky in appearance or cool to touch.  Neurological:     Mental Status: She is alert and oriented to person, place, and time.     Comments: Follows commands, Moves all extremities  5/5 strength to BUE and BLE  Sensation intact throughout all major nerve distributions  Psychiatric:        Speech: Speech normal.     Comments: Rapid, pressured speech. Often tangential but is able to be redirected.      ED Treatments / Results  Labs (all labs ordered are listed, but only abnormal results are displayed) Labs Reviewed  BASIC METABOLIC PANEL - Abnormal; Notable for the following components:      Result Value   Potassium 3.3 (*)    CO2 20 (*)    Glucose, Bld 153 (*)    BUN 5 (*)    All other components within normal limits  URINALYSIS, ROUTINE W REFLEX MICROSCOPIC - Abnormal; Notable for the following components:   Color, Urine COLORLESS (*)    Specific Gravity, Urine 1.002 (*)    pH 9.0 (*)    All other components within normal limits  CBC  HEPATIC FUNCTION PANEL  D-DIMER, QUANTITATIVE (NOT AT [facility])  CK  I-STAT BETA HCG BLOOD, ED ([facility], [facility], [facility] ONLY)  TROPONIN I (HIGH SENSITIVITY)    EKG EKG Interpretation  Date/Time:  Saturday September 05 2019 08:52:43 EDT Ventricular Rate:  102 PR Interval:  132 QRS Duration: 74 QT Interval:  334 QTC Calculation: 435 R Axis:   85 Text Interpretation:  Sinus tachycardia with Fusion complexes Biatrial enlargement Abnormal ECG Confirmed by Virgel Manifold [phone]) on 09/05/2019 10:04:24 AM   Radiology Dg Chest 2 View  Result Date: 09/05/2019 CLINICAL DATA:  Shortness of breath and chest pain. EXAM: CHEST - 2 VIEW COMPARISON:  None. FINDINGS: The lungs are clear without focal pneumonia, edema, pneumothorax or pleural effusion. Nodular density over the right base is probably a nipple shadow. The cardiopericardial silhouette is within normal limits for size. The  visualized bony structures of the thorax are intact. IMPRESSION: 1. No acute cardiopulmonary findings. 2. Nodular density over the right lung base likely a nipple shadow. Repeat frontal radiograph with nipple markers recommended to confirm. Electronically Signed   By: Misty Stanley M.D.   On: 09/05/2019 11:19   Ct Abdomen Pelvis W Contrast  Result Date: 09/05/2019 CLINICAL DATA:  Acute generalized abdominal pain. EXAM: CT ABDOMEN AND PELVIS WITH CONTRAST TECHNIQUE: Multidetector CT imaging of the abdomen and pelvis was performed using the standard protocol following bolus administration of intravenous contrast. CONTRAST:  168mL OMNIPAQUE IOHEXOL 300 MG/ML  SOLN COMPARISON:  CT scan of May 28, 2018. FINDINGS: Lower chest: No acute abnormality. Hepatobiliary: No focal liver abnormality is seen. No gallstones, gallbladder wall thickening, or biliary dilatation. Pancreas: Unremarkable. No pancreatic ductal dilatation or surrounding inflammatory changes. Spleen: Normal in size without focal abnormality. Adrenals/Urinary Tract: Adrenal glands are unremarkable. Kidneys are normal, without renal calculi, focal lesion, or hydronephrosis. Bladder is unremarkable. Stomach/Bowel: Stomach is within normal limits. Appendix appears normal. No evidence of bowel wall thickening, distention, or inflammatory changes. Vascular/Lymphatic: No significant vascular findings are present. No enlarged abdominal or pelvic lymph nodes. Reproductive: Uterus and bilateral adnexa are unremarkable. Other: No abdominal wall hernia or abnormality. No abdominopelvic ascites. Musculoskeletal: No acute or significant osseous findings. IMPRESSION: No definite abnormality seen in the abdomen or pelvis. Electronically Signed   By: Marijo Conception M.D.   On: 09/05/2019 12:25  Procedures Procedures (including critical care time)  Medications Ordered in ED Medications  sodium chloride flush (NS) 0.9 % injection 3 mL (3 mLs Intravenous Given  09/05/19 1020)  sodium chloride 0.9 % bolus 1,000 mL (0 mLs Intravenous Stopped 09/05/19 1339)  ondansetron (ZOFRAN) injection 4 mg (4 mg Intravenous Given 09/05/19 1020)  morphine 4 MG/ML injection 4 mg (4 mg Intravenous Given 09/05/19 1022)  iohexol (OMNIPAQUE) 300 MG/ML solution 100 mL (100 mLs Intravenous Contrast Given 09/05/19 1153)  potassium chloride SA (K-DUR) CR tablet 20 mEq (20 mEq Oral Given 09/05/19 1343)     Initial Impression / Assessment and Plan / ED Course  I have reviewed the triage vital signs and the nursing notes.  Pertinent labs & imaging results that were available during my care of the patient were reviewed by me and considered in my medical decision making (see chart for details).        53 year old female who presents for evaluation of chest pain and palpitations.  This is been an ongoing issue since July 2020.  She attributes it to taking Latuda.  Since then, she has continued to have symptoms.  She has been seen by both PCP and cardiology.  Cardiology initially prescribed propanolol but patient was unable to tolerate and did not feel like it helped.  She was switched to diltiazem.  He comes in today because she feels like symptoms are worsening.  She is supposed to follow-up with cardiology in November for coronary calcium scan.  She feels like it is getting worse and she cannot wait.  Also reports some increased urinary frequency.  Has had some episodes of vomiting over the last 2448 hrs.  No fevers.  Also reports some pain in her lower back.  Initial ED arrival, she is afebrile.  She was initially slightly tachycardic but that has since improved.  Vitals otherwise stable.  On exam, she is very anxious and has a very rapid and pressured speech.  She is very tangential but is able to be redirected.  Doubt ACS etiology given duration of symptoms and description of pain but is a consideration.  Doubt PE but given persistent tachycardia, it is a consideration.  She does not  have any PE risk factors.  Consider infectious etiology.  Also consider anxiety versus psych etiology.  Plan to check labs.  Reviewed her record.  She has been seen by Dr. Percival Spanish multiple times.  EKG showed intermittent PACs and PVCs.  Cardiac work-up was otherwise unremarkable.  Initial troponin is negative.  I-STAT beta is negative.  D-dimer is negative.  CK is negative.  CMP shows potassium of 3.3. Glucose is elevated at 153.  Otherwise unremarkable.  CBC shows no leukocytosis or anemia.  UA is negative for infectious etiology.   Patient had an additional episode of vomiting here in the ED.  She does have some tenderness noted to her lower back/CVA area.  Given persistence of symptoms, will plan for CT and pelvis.  Additionally, patient reports some tingling sensation in her fingers and toes.  She states that this happens intermittently and mostly occurs when she is having palpitations at night.  Normal neuro exam.  She has good palpable pulses and no evidence of dusky appearance or cool to touch extremities.  I suspect this most likely due anxiety.  CT abd/pelvis reviewed.  Negative for any acute abnormalities.  At this time, patient is hemodynamically stable.  Her work-up is been reassuring.  This is been constant pain that is  been ongoing for the last several months and she reports that her pain has been constant since last night.  At this time, 1 troponin is sufficient for ACS rule out.  Discussed results with patient.  She is resting comfortably on bed but is still very worried and concerned.  Patient states her pain is improved but she is worried about tonight when her symptoms seem to return.  She has not any vomiting since initial episode.  She has been ambulatory in the ED without any difficulty.  She is hemodynamically stable.  I discussed with patient that at this time, there is no emergent finding but she still needs to follow-up with her primary care doctor and with cardiology. At this  time, patient exhibits no emergent life-threatening condition that require further evaluation in ED or admission. Patient had ample opportunity for questions and discussion. All patient's questions were answered with full understanding. Strict return precautions discussed. Patient expresses understanding and agreement to plan.   Portions of this note were generated with Lobbyist. Dictation errors may occur despite best attempts at proofreading.   Final Clinical Impressions(s) / ED Diagnoses   Final diagnoses:  Palpitations  Atypical chest pain    ED Discharge Orders    None       Volanda Napoleon, PA-C 09/05/19 1549    Volanda Napoleon, PA-C 09/05/19 1556    Virgel Manifold, MD 09/06/19 [phone]

## 2019-09-06 NOTE — Telephone Encounter (Signed)
She has only taken the medicine for a day or two.  She needs to have follow up with her PCP and psychiatrist as part of a comprehensive approach.  Coronary calcium score can be moved up.

## 2019-09-07 ENCOUNTER — Telehealth: Payer: Self-pay

## 2019-09-07 NOTE — Telephone Encounter (Signed)
Pt called wanting to know her results of labs , pt has review her results on her my chart acct, she said that her cholesterol level is high and need something called into the pharmacy. Please advise.

## 2019-09-07 NOTE — Telephone Encounter (Signed)
Had to quit taking Diltiazem secondary to breathing issues, has subsided since stopping. Stated she went to ED and "passed out" while there in addition to two other times since last visit. She states her heart beating irregularly keeps her up at night. When she has these episodes her left arm gets "tingly" at times She really feels something going on with her heart and does not think this is anxiety related. Will forward to Dr Percival Spanish for review

## 2019-09-08 ENCOUNTER — Other Ambulatory Visit: Payer: Self-pay | Admitting: Family Medicine

## 2019-09-08 DIAGNOSIS — E876 Hypokalemia: Secondary | ICD-10-CM

## 2019-09-08 DIAGNOSIS — E785 Hyperlipidemia, unspecified: Secondary | ICD-10-CM

## 2019-09-08 MED ORDER — ROSUVASTATIN CALCIUM 10 MG PO TABS: 10.0000 mg | ORAL_TABLET | Freq: Every day | ORAL | 3 refills | Status: DC

## 2019-09-08 MED ORDER — POTASSIUM CHLORIDE CRYS ER 20 MEQ PO TBCR: 20.0000 meq | EXTENDED_RELEASE_TABLET | Freq: Every day | ORAL | 3 refills | Status: DC

## 2019-09-08 NOTE — Telephone Encounter (Signed)
Called pt to let her know the letter she requested was sent. Pt then says that she took herself off of the Cardizem because it "caused respiratory issues". She stated that she took herself to the hospital on Saturday 9/26 d/t chest pain, arm pain and back pain and her heart felt like it was beating out of her chest. Pt stated she was sent home the same day and that evening she had the same "attack" while she was staying with her brother. Her brother took a BP and got 104/73 with a pulse 105. The pt stated that her brother "saw her heart was palpitating". She stated that she and her family think she needs a stress test. Pt was advised that she should have called 911 that night and that next time she has those symptoms to call 911 or go to the emergency room. Pt was educated that Cardizem helps the heart rate and that could be why her heart is beating fast. Pt was advised to be seen in the office to discuss issues and get meds switched.

## 2019-09-08 NOTE — Telephone Encounter (Signed)
Reviewed labs with patient. °

## 2019-09-08 NOTE — Telephone Encounter (Signed)
The patient has atypical chest pain.  She would not be a candidate for coronary CTA because she does not tolerate meds to reduce her heart rate.  I suspect she would do poorly with a perfusion study as she is very sensitive to medications.  The pre test probability of obstructive CAD as the etiology of any of her symptoms is low.  So the invasive risk of a cardiac cath is not indicated.  She has had no significant findings on echo except low normal EF.  A home monitor demonstrated no significant arrhythmias.  It would be reasonable to order a POET (Plain Old Exercise Treadmill).

## 2019-09-09 ENCOUNTER — Encounter: Payer: Self-pay | Admitting: *Deleted

## 2019-09-09 NOTE — Telephone Encounter (Signed)
Advised patient, verbalized understanding  Message sent to scheduling to call and arrange

## 2019-09-09 NOTE — Addendum Note (Signed)
Addended by: Alvina Filbert B on: 09/09/2019 04:05 PM   Modules accepted: Orders

## 2019-09-09 NOTE — Telephone Encounter (Signed)
Left message to call back  Per Dr Percival Spanish GXT needs to be done prior to follow up visit, need to cancel appointment tomorrow

## 2019-09-10 ENCOUNTER — Ambulatory Visit: Payer: Medicare Other | Admitting: Cardiology

## 2019-09-10 ENCOUNTER — Encounter: Payer: Self-pay | Admitting: Cardiology

## 2019-09-10 NOTE — Telephone Encounter (Signed)
Patient scheduled 10/21

## 2019-09-26 ENCOUNTER — Other Ambulatory Visit (HOSPITAL_COMMUNITY)
Admission: RE | Admit: 2019-09-26 | Discharge: 2019-09-26 | Disposition: A | Discharge status: Home / Self Care | Payer: Medicare Other | Source: Ambulatory Visit | Attending: Cardiology | Admitting: Cardiology

## 2019-09-26 DIAGNOSIS — Z01812 Encounter for preprocedural laboratory examination: Secondary | ICD-10-CM | POA: Diagnosis present

## 2019-09-26 DIAGNOSIS — Z20828 Contact with and (suspected) exposure to other viral communicable diseases: Secondary | ICD-10-CM | POA: Diagnosis not present

## 2019-09-27 LAB — NOVEL CORONAVIRUS, NAA (HOSP ORDER, SEND-OUT TO REF LAB; TAT 18-24 HRS): SARS-CoV-2, NAA: NOT DETECTED

## 2019-09-29 ENCOUNTER — Telehealth (HOSPITAL_COMMUNITY): Payer: Self-pay

## 2019-09-29 NOTE — Telephone Encounter (Signed)
Encounter complete. 

## 2019-09-30 ENCOUNTER — Ambulatory Visit (HOSPITAL_COMMUNITY)
Admission: RE | Admit: 2019-09-30 | Discharge: 2019-09-30 | Disposition: A | Discharge status: Home / Self Care | Payer: Medicare Other | Source: Ambulatory Visit | Attending: Internal Medicine | Admitting: Internal Medicine

## 2019-09-30 ENCOUNTER — Other Ambulatory Visit: Payer: Self-pay

## 2019-09-30 DIAGNOSIS — R002 Palpitations: Secondary | ICD-10-CM | POA: Insufficient documentation

## 2019-09-30 DIAGNOSIS — R0789 Other chest pain: Secondary | ICD-10-CM | POA: Diagnosis present

## 2019-09-30 LAB — EXERCISE TOLERANCE TEST
Estimated workload: 10.9 METS
Exercise duration (min): 9 min
Exercise duration (sec): 30 s
MPHR: 168 {beats}/min
Peak HR: 166 {beats}/min
Percent HR: 98 %
Rest HR: 93 {beats}/min

## 2019-10-03 ENCOUNTER — Telehealth: Payer: Self-pay | Admitting: Medical

## 2019-10-03 ENCOUNTER — Telehealth: Payer: Self-pay | Admitting: Cardiology

## 2019-10-03 NOTE — Telephone Encounter (Signed)
   Patient called the after hours line with complaints of palpitations. Attempted to contact patient however there was no answer. Left a voicemail to reach back out to the after hours line if she had ongoing concerns.  Abigail Butts, PA-C 10/03/19

## 2019-10-03 NOTE — Telephone Encounter (Signed)
Patient paged on call number asking for a return phone call. Per documentation had paged the on call pager earlier with concerns about palpitations. Attempted to return her call x 2 (17:30 and 19:00). Busy signal obtained both times.   Papa [place] K. Marletta Lor, MD PGY-6 Fellow, Cardiovascular Disease

## 2019-10-04 ENCOUNTER — Telehealth: Payer: Self-pay | Admitting: Cardiology

## 2019-10-04 NOTE — Telephone Encounter (Signed)
Patient called answering service on 10/24 complaining of worsening palpitations and inability to sleep. Patient very tearful, upset on the phone. States that she has been taking Xanax to help fall asleep but is awoken after 1-2 hours by the sensation of her heart beating. Ongoing work up by Dr. Percival Spanish and intolerance to beta blockers and CCB. Patient specifically asking if she can take Benadryl for her insomnia. Also requesting a sleep study.   I recommended that she try OTC melatonin as a first line choice for a sleep aid. I told the patient that I would relay her concerns about ongoing / worsening symptoms to her treatment team.   Si Gaul. Marletta Lor, MD PGY-6

## 2019-10-04 NOTE — Telephone Encounter (Signed)
The patient has had a normal echo.  She has infrequent PVCs and PACs.  She is not able to tolerate low dose beta blocker or calcium channel blocker.  She needs to see her primary MD for management of anxiety.

## 2019-10-05 ENCOUNTER — Telehealth: Payer: Self-pay | Admitting: Cardiology

## 2019-10-05 ENCOUNTER — Ambulatory Visit: Payer: Self-pay | Admitting: Family Medicine

## 2019-10-05 NOTE — Telephone Encounter (Signed)
New Message    Patient c/o Palpitations:  High priority if patient c/o lightheadedness, shortness of breath, or chest pain  1) How long have you had palpitations/irregular HR/ Afib? Are you having the symptoms now? For months and the medication she's been put on isn't working.   2) Are you currently experiencing lightheadedness, SOB or CP? SOB and Lightheadedness  3) Do you have a history of afib (atrial fibrillation) or irregular heart rhythm? Yes  4) Have you checked your BP or HR? (document readings if available): 130/95 HR-102  5) Are you experiencing any other symptoms? Using the bathroom a lot.

## 2019-10-05 NOTE — Telephone Encounter (Signed)
Pt called, very tearful about palpitation concerns. Advised pt of Dr Hochrein's recommendations with seeing her PCP for anxiety. Pt disagreed with recommendations and stated it was her heart that was the problem. Asked pt if she takes anything daily for anxiety, pt denied. Advised pt to talk to PCP about anxiety medications, again. Pt asked if she still needed Calcium scoring, advised pt to go to her scheduled appt for cardiac scoring and to keep appt with Jory Sims this week. Advised understanding.

## 2019-10-07 ENCOUNTER — Emergency Department (HOSPITAL_COMMUNITY)
Admission: EM | Admit: 2019-10-07 | Discharge: 2019-10-07 | Disposition: A | Discharge status: Other Acute Inpatient Hospital | Payer: Medicare Other | Attending: Emergency Medicine | Admitting: Emergency Medicine

## 2019-10-07 ENCOUNTER — Encounter (HOSPITAL_COMMUNITY): Payer: Self-pay | Admitting: Emergency Medicine

## 2019-10-07 ENCOUNTER — Emergency Department (HOSPITAL_COMMUNITY): Payer: Medicare Other

## 2019-10-07 ENCOUNTER — Other Ambulatory Visit: Payer: Self-pay

## 2019-10-07 ENCOUNTER — Ambulatory Visit: Payer: Medicare Other | Admitting: Adult Health

## 2019-10-07 DIAGNOSIS — F332 Major depressive disorder, recurrent severe without psychotic features: Secondary | ICD-10-CM | POA: Diagnosis not present

## 2019-10-07 DIAGNOSIS — F429 Obsessive-compulsive disorder, unspecified: Secondary | ICD-10-CM | POA: Insufficient documentation

## 2019-10-07 DIAGNOSIS — J45909 Unspecified asthma, uncomplicated: Secondary | ICD-10-CM | POA: Diagnosis not present

## 2019-10-07 DIAGNOSIS — F411 Generalized anxiety disorder: Secondary | ICD-10-CM | POA: Insufficient documentation

## 2019-10-07 DIAGNOSIS — R109 Unspecified abdominal pain: Secondary | ICD-10-CM | POA: Diagnosis not present

## 2019-10-07 DIAGNOSIS — F419 Anxiety disorder, unspecified: Secondary | ICD-10-CM

## 2019-10-07 LAB — CBC WITH DIFFERENTIAL/PLATELET
Abs Immature Granulocytes: 0.02 10*3/uL (ref 0.00–0.07)
Basophils Absolute: 0 10*3/uL (ref 0.0–0.1)
Basophils Relative: 0 %
Eosinophils Absolute: 0 10*3/uL (ref 0.0–0.5)
Eosinophils Relative: 0 %
HCT: 40.2 % (ref 36.0–46.0)
Hemoglobin: 13.8 g/dL (ref 12.0–15.0)
Immature Granulocytes: 0 %
Lymphocytes Relative: 24 %
Lymphs Abs: 2.5 10*3/uL (ref 0.7–4.0)
MCH: 29.1 pg (ref 26.0–34.0)
MCHC: 34.3 g/dL (ref 30.0–36.0)
MCV: 84.6 fL (ref 80.0–100.0)
Monocytes Absolute: 0.8 10*3/uL (ref 0.1–1.0)
Monocytes Relative: 8 %
Neutro Abs: 7 10*3/uL (ref 1.7–7.7)
Neutrophils Relative %: 68 %
Platelets: 357 10*3/uL (ref 150–400)
RBC: 4.75 MIL/uL (ref 3.87–5.11)
RDW: 14.4 % (ref 11.5–15.5)
WBC: 10.4 10*3/uL (ref 4.0–10.5)
nRBC: 0 % (ref 0.0–0.2)

## 2019-10-07 LAB — COMPREHENSIVE METABOLIC PANEL
ALT: 19 U/L (ref 0–44)
AST: 27 U/L (ref 15–41)
Albumin: 4.5 g/dL (ref 3.5–5.0)
Alkaline Phosphatase: 90 U/L (ref 38–126)
Anion gap: 15 (ref 5–15)
BUN: 9 mg/dL (ref 6–20)
CO2: 18 mmol/L — ABNORMAL LOW (ref 22–32)
Calcium: 9.5 mg/dL (ref 8.9–10.3)
Chloride: 105 mmol/L (ref 98–111)
Creatinine, Ser: 0.71 mg/dL (ref 0.44–1.00)
GFR calc Af Amer: >60 mL/min (ref >60)
GFR calc non Af Amer: >60 mL/min (ref >60)
Glucose, Bld: 133 mg/dL — ABNORMAL HIGH (ref 70–99)
Potassium: 3.6 mmol/L (ref 3.5–5.1)
Sodium: 138 mmol/L (ref 135–145)
Total Bilirubin: 0.9 mg/dL (ref 0.3–1.2)
Total Protein: 7.3 g/dL (ref 6.5–8.1)

## 2019-10-07 LAB — URINALYSIS, ROUTINE W REFLEX MICROSCOPIC
Bilirubin Urine: NEGATIVE
Glucose, UA: NEGATIVE mg/dL
Hgb urine dipstick: NEGATIVE
Ketones, ur: 20 mg/dL — AB
Leukocytes,Ua: NEGATIVE
Nitrite: NEGATIVE
Protein, ur: NEGATIVE mg/dL
Specific Gravity, Urine: 1.011 (ref 1.005–1.030)
pH: 5 (ref 5.0–8.0)

## 2019-10-07 LAB — I-STAT BETA HCG BLOOD, ED (MC, WL, AP ONLY): I-stat hCG, quantitative: <5 m[IU]/mL (ref <5)

## 2019-10-07 LAB — LIPASE, BLOOD: Lipase: 25 U/L (ref 11–51)

## 2019-10-07 MED ORDER — SODIUM CHLORIDE 0.9 % IV BOLUS
1000.0000 mL | Freq: Once | INTRAVENOUS | Status: AC
  Administered 2019-10-07: 09:00:00 1000 mL via INTRAVENOUS

## 2019-10-07 MED ORDER — LORAZEPAM 1 MG PO TABS
1.0000 mg | ORAL_TABLET | Freq: Once | ORAL | Status: AC
  Administered 2019-10-07: 1 mg via ORAL
  Filled 2019-10-07: qty 1

## 2019-10-07 MED ORDER — SODIUM CHLORIDE 0.9 % IV BOLUS
1000.0000 mL | Freq: Once | INTRAVENOUS | Status: AC
  Administered 2019-10-07: 1000 mL via INTRAVENOUS

## 2019-10-07 MED ORDER — FENTANYL CITRATE (PF) 100 MCG/2ML IJ SOLN
50.0000 ug | Freq: Once | INTRAMUSCULAR | Status: AC
  Administered 2019-10-07: 09:00:00 50 ug via INTRAVENOUS
  Filled 2019-10-07: qty 2

## 2019-10-07 MED ORDER — ONDANSETRON HCL 4 MG/2ML IJ SOLN
4.0000 mg | Freq: Once | INTRAMUSCULAR | Status: AC
  Administered 2019-10-07: 4 mg via INTRAVENOUS
  Filled 2019-10-07: qty 2

## 2019-10-07 NOTE — ED Notes (Signed)
Spoke to [place] from [employer], reported Vitals and observations from current ED visit.

## 2019-10-07 NOTE — Progress Notes (Signed)
CSW received a call from Disposition stating pt is referred out for placement but does not meet IVC criteria, thus, if pt is accepted for inpatient psyche hospitalization, pt can sign a voluntary agreement to go or can be D/C'd.  Ava in disposition can be reached tonight at ph: [phone].  CSW will continue to follow for D/C needs.  Alphonse Guild. Cassian Torelli, LCSW, LCAS, CSI Transitions of Care Clinical Social Worker Care Coordination Department Ph: [phone]

## 2019-10-07 NOTE — Care Management (Signed)
Patient accepted to [facility] [place]  Accepting Dr. Dareen Piano The number to give report [phone].  The date and time that the patient can be transported today at any time.   Write informed the ER MD.

## 2019-10-07 NOTE — BH Assessment (Signed)
Tele Assessment Note   Patient Name: Stephanie Serrano MRN: [id number] Referring Physician: ED Physician Location of Patient: [facility] Location of Provider: [facility] Department  Cynara Aguillard is an 53 y.o. female. Pt reports SI with no plan. Pt denies HI/AVH. Pt reports sever anxiety. Per Pt her anxiety is causing her not to eat or sleep and her mental and physical health is suffering. 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 The Pt appeared very anxious and was crying throughout the assessment.  Collateral Contact from Pt's cousin Loma Sousa. Per Loma Sousa the Pt has been having a downward spiral. Per Loma Sousa the Pt calls her hourly and is having difficulty "coping with life." Loma Sousa states "I don't think she would hurt herself but she is unstable."  Shuvon, NP recommends inpatient treatment.   Diagnosis:  F41.1 GAD; Read.Massed Past Medical History:  Past Medical History:  Diagnosis Date  . Allergy   . Anemia   . Anxiety   . Arthritis   . Asthma   . Blood transfusion without reported diagnosis   . Depression   . Eczema   . Esophageal stricture   . GERD (gastroesophageal reflux disease)   . Migraines   . OCD (obsessive compulsive disorder)   . RLS (restless legs syndrome)   . Vertigo     Past Surgical History:  Procedure Laterality Date  . ESOPHAGOGASTRODUODENOSCOPY N/A 02/09/2015   Procedure: ESOPHAGOGASTRODUODENOSCOPY (EGD);  Surgeon: Inda Castle, MD;  Location: [facility];  Service: Endoscopy;  Laterality: N/A;  with dilation  . FOOT SURGERY     bone spurs  . NASAL SINUS SURGERY    . PLANTAR FASCIA RELEASE Right 08/03/2016   Procedure: PLANTAR FASCIA RELEASE WITH BONE SPUR EXCISION;  Surgeon: Dorna Leitz, MD;  Location: [facility];  Service: Orthopedics;  Laterality: Right;  . TONSILLECTOMY      Family History:  Family History  Problem  Relation Age of Onset  . Pancreatic cancer Father   . Allergic rhinitis Father   . Asthma Father   . Stomach cancer Father   . Diabetes Mother   . Heart disease Mother        No details   . Allergic rhinitis Mother   . Anxiety disorder Brother   . Diabetes Paternal Grandmother   . Colon cancer Maternal Grandmother   . Cancer Other   . Esophageal cancer Neg Hx   . Eczema Neg Hx   . Urticaria Neg Hx   . Liver cancer Neg Hx   . Rectal cancer Neg Hx     Social History:  reports that she has never smoked. She has never used smokeless tobacco. She reports current alcohol use. She reports that she does not use drugs.  Additional Social History:  Alcohol / Drug Use Pain Medications: please s  CIWA: CIWA-Ar BP: 140/88 Pulse Rate: 77 COWS:    Allergies:  Allergies  Allergen Reactions  . Diltiazem Shortness Of Breath  . Azithromycin   . Clindamycin/Lincomycin   . Doxycycline     Swelling of mucous membrane, body pain  . Penicillins     Has patient had a PCN reaction causing immediate rash, facial/tongue/throat swelling, SOB or lightheadedness with hypotension: yes Has patient had a PCN reaction causing severe rash involving mucus membranes or skin necrosis: no Has patient had a PCN reaction that required hospitalization: no Has patient had a PCN  reaction occurring within the last 10 years: no If all of the above answers are "NO", then may proceed with Cephalosporin use.   [name] Propranolol     Increase palpitations  . Sulfa Antibiotics   . Sulfasalazine Other (See Comments)  . Vicodin [Hydrocodone-Acetaminophen] Nausea Only  . Codeine Anxiety, Itching and Nausea And Vomiting    Home Medications: (Not in a hospital admission)   OB/GYN Status:  Patient's last menstrual period was 01/17/2017.  General Assessment Data [facility] of [facility]: [facility] TTS Assessment: In system Is this a Tele or Face-to-Face Assessment?: Tele Assessment Is this an Initial Assessment or a  Re-assessment for this encounter?: Initial Assessment Patient Accompanied by:: N/A Language Other than English: No Living Arrangements: Other (Comment) What gender do you identify as?: Female Marital status: Single Maiden name: NA Pregnancy Status: No Living Arrangements: Children Can pt return to current living arrangement?: Yes Admission Status: Voluntary Is patient capable of signing voluntary admission?: Yes Referral Source: Self/Family/Friend Insurance type: Medicare     Crisis Care Plan Living Arrangements: Children Legal Guardian: Other:(self) Name of Psychiatrist: NA Name of Therapist: NA  Education Status Is patient currently in school?: No Is the patient employed, unemployed or receiving disability?: Receiving disability income  Risk to self with the past 6 months Suicidal Ideation: Yes-Currently Present Has patient been a risk to self within the past 6 months prior to admission? : No Suicidal Intent: Yes-Currently Present Has patient had any suicidal intent within the past 6 months prior to admission? : No Is patient at risk for suicide?: Yes Suicidal Plan?: No Has patient had any suicidal plan within the past 6 months prior to admission? : No Specify Current Suicidal Plan: no plan Access to Means: Yes Specify Access to Suicidal Means: access to medications What has been your use of drugs/alcohol within the last 12 months?: NA Previous Attempts/Gestures: No How many times?: 0 Other Self Harm Risks: NA Triggers for Past Attempts: None known Intentional Self Injurious Behavior: None Family Suicide History: No Recent stressful life event(s): Other (Comment)(lack of sleep) Persecutory voices/beliefs?: No Depression: Yes Depression Symptoms: Tearfulness, Isolating, Loss of interest in usual pleasures, Feeling worthless/self pity Substance abuse history and/or treatment for substance abuse?: No Suicide prevention information given to non-admitted patients: Not  applicable  Risk to Others within the past 6 months Homicidal Ideation: No Does patient have any lifetime risk of violence toward others beyond the six months prior to admission? : No Thoughts of Harm to Others: No Current Homicidal Intent: No Current Homicidal Plan: No Access to Homicidal Means: No Identified Victim: NA History of harm to others?: No Assessment of Violence: None Noted Violent Behavior Description: NA Does patient have access to weapons?: No Criminal Charges Pending?: No Does patient have a court date: No Is patient on probation?: No  Psychosis Hallucinations: None noted Delusions: None noted  Mental Status Report Appearance/Hygiene: Unremarkable Eye Contact: Fair Motor Activity: Freedom of movement Speech: Logical/coherent Level of Consciousness: Alert Mood: Anxious Affect: Anxious Anxiety Level: Severe Thought Processes: Coherent, Relevant Judgement: Impaired Orientation: Person, Place, Time, Situation Obsessive Compulsive Thoughts/Behaviors: None  Cognitive Functioning Concentration: Normal Memory: Recent Intact, Remote Intact Is patient IDD: No Insight: Fair Impulse Control: Fair Appetite: Fair Have you had any weight changes? : No Change Sleep: No Change Total Hours of Sleep: 8 Vegetative Symptoms: None  ADLScreening [facility] Assessment Services) Patient's cognitive ability adequate to safely complete daily activities?: Yes Patient able to express need for assistance with ADLs?: Yes Independently  performs ADLs?: Yes (appropriate for developmental age)  Prior Inpatient Therapy Prior Inpatient Therapy: Yes Prior Therapy Dates: 2018 Prior Therapy Facilty/Provider(s): [facility] Reason for Treatment: DID  Prior Outpatient Therapy Prior Outpatient Therapy: No Does patient have an ACCT team?: No Does patient have Intensive In-House Services?  : No Does patient have [facility] services? : No Does patient have P4CC services?: No  ADL Screening  (condition at time of admission) Patient's cognitive ability adequate to safely complete daily activities?: Yes Is the patient deaf or have difficulty hearing?: No Does the patient have difficulty seeing, even when wearing glasses/contacts?: No Does the patient have difficulty concentrating, remembering, or making decisions?: No Patient able to express need for assistance with ADLs?: Yes Does the patient have difficulty dressing or bathing?: No Independently performs ADLs?: Yes (appropriate for developmental age) Does the patient have difficulty walking or climbing stairs?: No       Abuse/Neglect Assessment (Assessment to be complete while patient is alone) Abuse/Neglect Assessment Can Be Completed: Yes Physical Abuse: Denies Verbal Abuse: Denies Sexual Abuse: Denies Exploitation of patient/patient's resources: Denies     Regulatory affairs officer (For Healthcare) Does Patient Have a Medical Advance Directive?: No Would patient like information on creating a medical advance directive?: No - Patient declined          Disposition:  Disposition Initial Assessment Completed for this Encounter: Yes  This service was provided via telemedicine using a 2-way, interactive audio and video technology.  Names of all persons participating in this telemedicine service and their role in this encounter. Name: Stanford Breed  Name:  Role:   Name:    Name:  Role:    Cyndia Bent 10/07/2019 2:14 PM

## 2019-10-07 NOTE — ED Provider Notes (Signed)
[facility] DEPT Provider Note   CSN: [id number] Arrival date & time: 10/07/19  0507     History   Chief Complaint Chief Complaint  Patient presents with  . Flank Pain  . Anxiety    HPI Stephanie Serrano is a 53 y.o. female.     The history is provided by the patient.  Flank Pain This is a new problem. The current episode started more than 2 days ago. The problem occurs daily. Progression since onset: waxing and waning. Associated symptoms include abdominal pain. Pertinent negatives include no chest pain, no headaches and no shortness of breath. Nothing aggravates the symptoms. Nothing relieves the symptoms. She has tried nothing for the symptoms. The treatment provided no relief.    Past Medical History:  Diagnosis Date  . Allergy   . Anemia   . Anxiety   . Arthritis   . Asthma   . Blood transfusion without reported diagnosis   . Depression   . Eczema   . Esophageal stricture   . GERD (gastroesophageal reflux disease)   . Migraines   . OCD (obsessive compulsive disorder)   . RLS (restless legs syndrome)   . Vertigo     Patient Active Problem List   Diagnosis Date Noted  . Heart palpitations 09/05/2019  . Shortness of breath 09/05/2019  . Anxiety 09/05/2019  . Tachycardia 07/23/2019  . Sprain of right ankle 02/03/2019  . MDD (major depressive disorder), recurrent episode, moderate ([place]) 06/28/2017  . OCD (obsessive compulsive disorder) 03/26/2017  . Obstructive sleep apnea of adult 01/18/2017  . Insomnia 02/08/2016  . Heel spur 09/01/2015  . Family history of cystitis 08/31/2015  . Left foot pain 08/31/2015  . Prediabetes 08/23/2015  . Hyperlipidemia 08/23/2015  . Depression 03/21/2015  . Acute esophagitis 02/09/2015  . Gastroesophageal reflux disease without esophagitis 08/27/2014  . Dysphagia, unspecified(787.20) 03/22/2014    Past Surgical History:  Procedure Laterality Date  . ESOPHAGOGASTRODUODENOSCOPY N/A 02/09/2015    Procedure: ESOPHAGOGASTRODUODENOSCOPY (EGD);  Surgeon: Inda Castle, MD;  Location: [facility];  Service: Endoscopy;  Laterality: N/A;  with dilation  . FOOT SURGERY     bone spurs  . NASAL SINUS SURGERY    . PLANTAR FASCIA RELEASE Right 08/03/2016   Procedure: PLANTAR FASCIA RELEASE WITH BONE SPUR EXCISION;  Surgeon: Dorna Leitz, MD;  Location: [facility];  Service: Orthopedics;  Laterality: Right;  . TONSILLECTOMY       OB History   No obstetric history on file.      Home Medications    Prior to Admission medications   Medication Sig Start Date End Date Taking? Authorizing Provider  ALPRAZolam Duanne Moron) 1 MG tablet Take 1 mg by mouth 2 (two) times daily as needed for anxiety.    Yes [provider]  acyclovir (ZOVIRAX) 800 MG tablet Take 1 tablet (800 mg total) by mouth 5 (five) times daily. Patient not taking: Reported on 09/03/2019 08/03/19   Azzie Glatter, FNP  albuterol (PROVENTIL HFA;VENTOLIN HFA) 108 [phone] Base) MCG/ACT inhaler Inhale 2 puffs into the lungs every 6 (six) hours as needed for wheezing or shortness of breath. Patient not taking: Reported on 09/03/2019 07/02/17   Lindell Spar I, NP  atorvastatin (LIPITOR) 10 MG tablet Take 1 tablet (10 mg total) by mouth daily. Patient not taking: Reported on 09/03/2019 08/06/19   Azzie Glatter, FNP  flunisolide (NASALIDE) 25 MCG/ACT (0.025%) SOLN Place 2 sprays into the nose daily. Patient not taking: Reported  on 09/03/2019 08/20/19   Valentina Shaggy, MD  levalbuterol [facility] HFA) 45 MCG/ACT inhaler Inhale 2 puffs into the lungs every 6 (six) hours as needed for wheezing. Patient not taking: Reported on 09/03/2019 08/03/19   Valentina Shaggy, MD  levocetirizine (XYZAL) 5 MG tablet TAKE 1 TABLET BY MOUTH EVERY EVENING Patient not taking: Reported on 09/03/2019 10/27/18   Valentina Shaggy, MD  LINZESS 72 MCG capsule TAKE 1 CAPSULE BY MOUTH DAILY BEFORE BREAKFAST Patient not taking: Reported  on 09/03/2019 10/21/17   Scot Jun, FNP  meclizine (ANTIVERT) 25 MG tablet Take 1 tablet (25 mg total) by mouth 3 (three) times daily as needed for dizziness. Patient not taking: Reported on 09/03/2019 02/02/19   Azzie Glatter, FNP  mometasone (NASONEX) 50 MCG/ACT nasal spray Place 2 sprays into the nose daily. Patient not taking: Reported on 09/03/2019 08/03/19   Valentina Shaggy, MD  polyethylene glycol [facility]) packet Take 17 g by mouth daily. Patient not taking: Reported on 09/03/2019 02/02/19   Azzie Glatter, FNP  potassium chloride SA (KLOR-CON) 20 MEQ tablet Take 1 tablet (20 mEq total) by mouth daily. Patient not taking: Reported on 10/07/2019 09/08/19   Azzie Glatter, FNP  PULMICORT 0.5 MG/2ML nebulizer solution Use as directed for nasal irrigation twice daiy Patient not taking: Reported on 09/03/2019 03/13/18   Valentina Shaggy, MD  rosuvastatin (CRESTOR) 10 MG tablet Take 1 tablet (10 mg total) by mouth daily. Patient not taking: Reported on 10/07/2019 09/08/19   Azzie Glatter, FNP  traZODone (DESYREL) 50 MG tablet Take 1 tablet (50 mg total) by mouth at bedtime as needed for sleep. Patient not taking: Reported on 09/03/2019 07/02/17   Encarnacion Slates, NP    Family History Family History  Problem Relation Age of Onset  . Pancreatic cancer Father   . Allergic rhinitis Father   . Asthma Father   . Stomach cancer Father   . Diabetes Mother   . Heart disease Mother        No details   . Allergic rhinitis Mother   . Anxiety disorder Brother   . Diabetes Paternal Grandmother   . Colon cancer Maternal Grandmother   . Cancer Other   . Esophageal cancer Neg Hx   . Eczema Neg Hx   . Urticaria Neg Hx   . Liver cancer Neg Hx   . Rectal cancer Neg Hx     Social History Social History   Tobacco Use  . Smoking status: Never Smoker  . Smokeless tobacco: Never Used  Substance Use Topics  . Alcohol use: Yes    Comment: socially  . Drug use: No      Allergies   Diltiazem, Azithromycin, Clindamycin/lincomycin, Doxycycline, Penicillins, Propranolol, Sulfa antibiotics, Sulfasalazine, Vicodin [hydrocodone-acetaminophen], and Codeine   Review of Systems Review of Systems  Constitutional: Negative for chills and fever.  HENT: Negative for ear pain and sore throat.   Eyes: Negative for pain and visual disturbance.  Respiratory: Negative for cough and shortness of breath.   Cardiovascular: Negative for chest pain and palpitations.  Gastrointestinal: Positive for abdominal pain. Negative for abdominal distention, anal bleeding, blood in stool, constipation, diarrhea, nausea, rectal pain and vomiting.  Genitourinary: Positive for flank pain. Negative for decreased urine volume, dysuria, hematuria, pelvic pain, urgency, vaginal bleeding, vaginal discharge and vaginal pain.  Musculoskeletal: Positive for back pain (left flank). Negative for arthralgias.  Skin: Negative for color change and rash.  Neurological: Negative  for seizures, syncope and headaches.  All other systems reviewed and are negative.    Physical Exam Updated Vital Signs  ED Triage Vitals  Enc Vitals Group     BP 10/07/19 0515 (!) 161/104     Pulse Rate 10/07/19 0515 (!) 119     Resp 10/07/19 0515 18     Temp 10/07/19 0515 98 F (36.7 C)     Temp Source 10/07/19 0515 Oral     SpO2 10/07/19 0515 97 %     Weight 10/07/19 0515 136 lb 9.6 oz (62 kg)     Height 10/07/19 0515 5\' 2"  (1.575 m)     Head Circumference --      Peak Flow --      Pain Score 10/07/19 0522 10     Pain Loc --      Pain Edu? --      Excl. in [place]? --     Physical Exam Vitals signs and nursing note reviewed.  Constitutional:      General: She is not in acute distress.    Appearance: She is well-developed.  HENT:     Head: Normocephalic and atraumatic.     Nose: Nose normal.     Mouth/Throat:     Mouth: Mucous membranes are moist.  Eyes:     Extraocular Movements: Extraocular movements  intact.     Conjunctiva/sclera: Conjunctivae normal.     Pupils: Pupils are equal, round, and reactive to light.  Neck:     Musculoskeletal: Normal range of motion and neck supple.  Cardiovascular:     Rate and Rhythm: Normal rate and regular rhythm.     Heart sounds: No murmur.  Pulmonary:     Effort: Pulmonary effort is normal. No respiratory distress.     Breath sounds: Normal breath sounds.  Abdominal:     General: Abdomen is flat. There is no distension.     Palpations: Abdomen is soft.     Tenderness: There is abdominal tenderness (left side of abdomen ). There is left CVA tenderness.  Musculoskeletal: Normal range of motion.        General: Tenderness (left cva) present.     Comments: No midline spinal tenderness  Skin:    General: Skin is warm and dry.  Neurological:     General: No focal deficit present.     Mental Status: She is alert and oriented to person, place, and time.     Cranial Nerves: No cranial nerve deficit.     Sensory: No sensory deficit.     Motor: No weakness.     Coordination: Coordination normal.  Psychiatric:        Mood and Affect: Mood normal.      ED Treatments / Results  Labs (all labs ordered are listed, but only abnormal results are displayed) Labs Reviewed  COMPREHENSIVE METABOLIC PANEL - Abnormal; Notable for the following components:      Result Value   CO2 18 (*)    Glucose, Bld 133 (*)    All other components within normal limits  URINE CULTURE  CBC WITH DIFFERENTIAL/PLATELET  LIPASE, BLOOD  URINALYSIS, ROUTINE W REFLEX MICROSCOPIC  I-STAT BETA HCG BLOOD, ED ([facility], [facility], [facility] ONLY)    EKG EKG Interpretation  Date/Time:  Wednesday October 07 2019 07:56:55 EDT Ventricular Rate:  93 PR Interval:    QRS Duration: 104 QT Interval:  373 QTC Calculation: 464 R Axis:   85 Text Interpretation: Sinus arrhythmia Multiple ventricular premature  complexes Baseline wander in lead(s) V6 Confirmed by Lennice Sites [phone]) on 10/07/2019  8:30:16 AM   Radiology Ct Renal Stone Study  Result Date: 10/07/2019 CLINICAL DATA:  Low back pain, left lower quadrant and left flank pain. EXAM: CT ABDOMEN AND PELVIS WITHOUT CONTRAST TECHNIQUE: Multidetector CT imaging of the abdomen and pelvis was performed following the standard protocol without IV contrast. COMPARISON:  09/05/2019 FINDINGS: Lower chest: Lung bases are clear. No effusions. Heart is normal size. Hepatobiliary: No focal hepatic abnormality. Gallbladder unremarkable. Pancreas: No focal abnormality or ductal dilatation. Spleen: No focal abnormality.  Normal size. Adrenals/Urinary Tract: No adrenal abnormality. No focal renal abnormality. No stones or hydronephrosis. Urinary bladder is unremarkable. Stomach/Bowel: Normal appendix. Stomach, large and small bowel grossly unremarkable. Vascular/Lymphatic: No evidence of aneurysm or adenopathy. Reproductive: Uterus and adnexa unremarkable.  No mass. Other: No free fluid or free air. Musculoskeletal: No acute bony abnormality. IMPRESSION: No renal or ureteral stones.  No hydronephrosis. No acute findings in the abdomen or pelvis. Electronically Signed   By: Rolm Baptise M.D.   On: 10/07/2019 09:33    Procedures Procedures (including critical care time)  Medications Ordered in ED Medications  LORazepam (ATIVAN) tablet 1 mg (has no administration in time range)  sodium chloride 0.9 % bolus 1,000 mL (0 mLs Intravenous Stopped 10/07/19 0936)  ondansetron (ZOFRAN) injection 4 mg (4 mg Intravenous Given 10/07/19 0830)  fentaNYL (SUBLIMAZE) injection 50 mcg (50 mcg Intravenous Given 10/07/19 0830)  sodium chloride 0.9 % bolus 1,000 mL (1,000 mLs Intravenous New Bag/Given 10/07/19 1046)     Initial Impression / Assessment and Plan / ED Course  I have reviewed the triage vital signs and the nursing notes.  Pertinent labs & imaging results that were available during my care of the patient were reviewed by me and considered in my medical  decision making (see chart for details).    Stephanie Serrano is a 53 year old female with history of anxiety, OCD who presents the ED with left flank pain.  Patient with overall unremarkable vitals.  She appears extremely uncomfortable upon my examination.  Has left-sided abdominal tenderness and left CVA tenderness on exam.  Denies history of kidney stones.  Denies any specific urinary symptoms.  States that she has been under a lot of stress and anxiety and unable to live her daily life.  She does take Xanax but states that it is not helping.  She is awaiting ambulatory referral to psychiatry.  Lab work showed no significant anemia, electrolyte abnormality, kidney injury.  CT scan of her abdomen pelvis was unremarkable.  No kidney stones.  No bowel obstruction.  Felt slightly better following IV fluids and Zofran.  However on reevaluation patient continues to endorse anxiety and being unable to live her life.  She wants inpatient treatment for depression and anxiety but does not specifically state that she is suicidal or homicidal.  We will have her evaluated by psychiatry.  Will give her dose of Ativan.  Awaiting psychiatry recommendations. Medically cleared at this time.  Patient is here voluntarily.  This chart was dictated using voice recognition software.  Despite best efforts to proofread,  errors can occur which can change the documentation meaning.    Final Clinical Impressions(s) / ED Diagnoses   Final diagnoses:  Anxiety    ED Discharge Orders    None       Lennice Sites, DO 10/07/19 1211

## 2019-10-07 NOTE — BH Assessment (Signed)
[facility] Assessment Progress Note  Per Shuvon Rankin, FNP, this voluntary pt requires psychiatric hospitalization at this time.  The following facilities have been contacted to seek placement for this pt, with results as noted:  Beds available, information sent, decision pending:  [facility]   At capacity:  Larence Penning [place], [place] Coordinator [phone]

## 2019-10-07 NOTE — ED Triage Notes (Signed)
Patient here from home with complaints of palpitations and lower back pain. Seen multiple times for same. Hx of anxiety.

## 2019-10-08 LAB — URINE CULTURE: Culture: <10000 — AB

## 2019-10-12 ENCOUNTER — Other Ambulatory Visit: Payer: Self-pay

## 2019-10-12 ENCOUNTER — Ambulatory Visit (INDEPENDENT_AMBULATORY_CARE_PROVIDER_SITE_OTHER)
Admission: RE | Admit: 2019-10-12 | Discharge: 2019-10-12 | Disposition: A | Discharge status: Home / Self Care | Payer: Self-pay | Source: Ambulatory Visit | Attending: Cardiology | Admitting: Cardiology

## 2019-10-12 ENCOUNTER — Telehealth: Payer: Self-pay | Admitting: Cardiology

## 2019-10-12 DIAGNOSIS — R072 Precordial pain: Secondary | ICD-10-CM

## 2019-10-12 NOTE — Telephone Encounter (Signed)
She can stop it but I have no other meds I would prescribe.  She has not tolerated beta blockers.

## 2019-10-12 NOTE — Telephone Encounter (Signed)
Pt c/o medication issue:  1. Name of Medication:Diltiazem  2. How are you currently taking this medication (dosage and times per day)? Patient states as directed  3. Are you having a reaction (difficulty breathing--STAT)? No  4. What is your medication issue? Patient states she feels faint and feels it is coming from this medication. She says she was prescribed this medication for her BP but I am not finding any indication that she was prescribed this medication by [place]. Please advise.

## 2019-10-13 MED ORDER — ATENOLOL 25 MG PO TABS: 25.0000 mg | ORAL_TABLET | Freq: Every day | ORAL | 3 refills | Status: DC

## 2019-10-13 NOTE — Addendum Note (Signed)
Addended by: Raiford Simmonds on: 10/13/2019 11:22 AM   Modules accepted: Orders

## 2019-10-13 NOTE — Telephone Encounter (Signed)
Follow up    Patient requesting a call to discuss beta blocker

## 2019-10-13 NOTE — Telephone Encounter (Signed)
Spoke to patient . She states sh has respond through [employer] as well.   She states she needs some other medication in the beta blocker  Family other than propanolol-  She states she has nt been able to sleep for the last 5 nights  due to the palpations. Not even sleep medications help.  Patient states she feels them all night long.   RN  Gave patient the information from Dr Percival Spanish . RN suggested to keep appointment to discuss in details. - also patient has  Appointment on 11/5 in office - she is aware of appointment and that it will be in the office.  patient states she would like this message sent to  Dr Percival Spanish and his nurse. - she wants something prior to visit.  aware will send message.

## 2019-10-13 NOTE — Telephone Encounter (Signed)
OK to give Atenolol 25 mg po daily.  Disp number 30 with three refills.

## 2019-10-13 NOTE — Telephone Encounter (Signed)
Spoke to patient she is aware.  patient asked why Dr Percival Spanish is prescribing  A medication that ends in "ol" is there not aother medication in the Beta blocker family that would work/\.  RN informed patient that all  Medications in the beta blocker class -they all end with "ol". Patient states she know other people who take medication that help the heart rate , blood pressure do not take medications with that  Letter ending . RN informed patient they are not using a beta blocker  To treat their issue.  Patient verbalized understanding.

## 2019-10-14 DIAGNOSIS — R079 Chest pain, unspecified: Secondary | ICD-10-CM | POA: Insufficient documentation

## 2019-10-14 NOTE — Progress Notes (Signed)
Cardiology Office Note   Date:  10/15/2019   ID:  Stephanie Serrano, DOB 20-Mar-1966, MRN [id number]  PCP:  Glenis Smoker, MD  Cardiologist:   Minus Breeding, MD   Chief Complaint  Patient presents with  . Palpitations      History of Present Illness: Stephanie Serrano is a 53 y.o. female who who presents for follow up of chest pain and  tachycardia .   She had palpitations and wore a Holter.   This demonstrated Mobitz Type I and rare SVEs and PVCs.    I tried a low dose of propranolol.  She continued to have symptoms.  I tried Cardizem but she did not tolerate this after a few doses.  She has had an echo which had a low normal EF and was otherwise unremarkable.  She had a negative POET (Plain Old Exercise Treadmill) and a zero calcium score.  She called the other day with continued symptoms and requested another therapy so I have started atenolol.   She was last in the ED in late October and was thought to be having anxiety.  I reviewed these records for this visit.       She to started taking atenolol couple of days ago as mentioned and says she cannot tolerate this.  She thinks she cannot tolerate any of the medicines that ended with OL.  She thinks they make the palpitations worse.  She is very anxious about not being able to sleep at night.  The palpitations make her awake.  This gives her more anxiety.  She is very convinced that anxiety is not the primary issue.  She is exercising and still able to do this without limitations.   Past Medical History:  Diagnosis Date  . Allergy   . Anemia   . Anxiety   . Arthritis   . Asthma   . Blood transfusion without reported diagnosis   . Depression   . Eczema   . Esophageal stricture   . GERD (gastroesophageal reflux disease)   . Migraines   . OCD (obsessive compulsive disorder)   . RLS (restless legs syndrome)   . Vertigo     Past Surgical History:  Procedure Laterality Date  . ESOPHAGOGASTRODUODENOSCOPY N/A 02/09/2015   Procedure: ESOPHAGOGASTRODUODENOSCOPY (EGD);  Surgeon: Inda Castle, MD;  Location: [facility];  Service: Endoscopy;  Laterality: N/A;  with dilation  . FOOT SURGERY     bone spurs  . NASAL SINUS SURGERY    . PLANTAR FASCIA RELEASE Right 08/03/2016   Procedure: PLANTAR FASCIA RELEASE WITH BONE SPUR EXCISION;  Surgeon: Dorna Leitz, MD;  Location: [facility];  Service: Orthopedics;  Laterality: Right;  . TONSILLECTOMY       Current Outpatient Medications  Medication Sig Dispense Refill  . albuterol (PROVENTIL HFA;VENTOLIN HFA) 108 (90 Base) MCG/ACT inhaler Inhale 2 puffs into the lungs every 6 (six) hours as needed for wheezing or shortness of breath.    . DULoxetine (CYMBALTA) 30 MG capsule Take 30 mg by mouth daily.    [name] levalbuterol (XOPENEX HFA) 45 MCG/ACT inhaler Inhale 2 puffs into the lungs every 6 (six) hours as needed for wheezing. 1 Inhaler 5  . ALPRAZolam (XANAX) 1 MG tablet Take 1 mg by mouth 2 (two) times daily as needed for anxiety.     . polyethylene glycol (MIRALAX) packet Take 17 g by mouth daily. (Patient not taking: Reported on 09/03/2019) 14 each 3  . verapamil (CALAN SR) 120  MG CR tablet Take 1 tablet (120 mg total) by mouth at bedtime. 90 tablet 1   Current Facility-Administered Medications  Medication Dose Route Frequency Provider Last Rate Last Dose  . dupilumab (DUPIXENT) prefilled syringe 300 mg  300 mg Subcutaneous Q14 Days Valentina Shaggy, MD   300 mg at 08/03/19 1533    Allergies:   Diltiazem, Azithromycin, Clindamycin/lincomycin, Doxycycline, Penicillins, Propranolol, Sulfa antibiotics, Sulfasalazine, Vicodin [hydrocodone-acetaminophen], and Codeine      ROS:  Please see the history of present illness.   Otherwise, review of systems are positive for insomnia.  none.   All other systems are reviewed and negative.    PHYSICAL EXAM: VS:  BP 111/78   Pulse 64   Temp 98.2 F (36.8 C)   Ht 5\' 2"  (1.575 m)   Wt 141 lb (64 kg)   LMP  01/17/2017 Comment: NEG BETA HCG 10/07/19  SpO2 100%   BMI 25.79 kg/m  , BMI Body mass index is 25.79 kg/m. GENERAL:  Well appearing NECK:  No jugular venous distention, waveform within normal limits, carotid upstroke brisk and symmetric, no bruits, no thyromegaly LUNGS:  Clear to auscultation bilaterally CHEST:  Unremarkable HEART:  PMI not displaced or sustained,S1 and S2 within normal limits, no S3, no S4, no clicks, no rubs, no murmurs ABD:  Flat, positive bowel sounds normal in frequency in pitch, no bruits, no rebound, no guarding, no midline pulsatile mass, no hepatomegaly, no splenomegaly EXT:  2 plus pulses throughout, no edema, no cyanosis no clubbing   EKG:  EKG is ordered today. The ekg ordered today demonstrates sinus rhythm, rate 60, axis within normal limits, intervals within normal limits, no acute ST-T wave changes.   Recent Labs: 09/04/2019: TSH 1.450 10/07/2019: ALT 19; BUN 9; Creatinine, Ser 0.71; Hemoglobin 13.8; Platelets 357; Potassium 3.6; Sodium 138    Lipid Panel    Component Value Date/Time   CHOL 265 (H) 09/04/2019 0957   TRIG 69 09/04/2019 0957   HDL 68 09/04/2019 0957   CHOLHDL 3.9 09/04/2019 0957   CHOLHDL 3.9 10/26/2016 0901   VLDL 20 10/26/2016 0901   LDLCALC 186 (H) 09/04/2019 0957      Wt Readings from Last 3 Encounters:  10/15/19 141 lb (64 kg)  10/07/19 136 lb 9.6 oz (62 kg)  09/04/19 145 lb (65.8 kg)      Other studies Reviewed: Additional studies/ records that were reviewed today include: ED record. Review of the above records demonstrates:  Please see elsewhere in the note.     ASSESSMENT AND PLAN:   Palpitations: This is her most significant symptom.  She has been intolerant of any other beta-blockers although she is only been on it for very brief periods of time.  Given this I am after much discussion okay with trying verapamil 120 mg daily to see if she has any symptomatic improvement.  However, if this does not work I would  not suggest that I would be able to offer further therapies that would be helpful but would instead refer her to electrophysiology.  She has questions about whether cardioversion would be indicated and I explained why it would not.  She has questions about whether a pacemaker would be indicated and I explained why it would not.    Current medicines are reviewed at length with the patient today.  The patient does not have concerns regarding medicines.  The following changes have been made:  As above  Labs/ tests ordered today include:  Orders Placed This Encounter  Procedures  . EKG 12-Lead     Disposition:   FU with me as needed.      Signed, Minus Breeding, MD  10/15/2019 2:43 PM    [place]

## 2019-10-15 ENCOUNTER — Ambulatory Visit (INDEPENDENT_AMBULATORY_CARE_PROVIDER_SITE_OTHER): Payer: Medicare Other | Admitting: Cardiology

## 2019-10-15 ENCOUNTER — Encounter: Payer: Self-pay | Admitting: Cardiology

## 2019-10-15 ENCOUNTER — Other Ambulatory Visit: Payer: Self-pay

## 2019-10-15 VITALS — BP 111/78 | HR 64 | Temp 98.2°F | Ht 62.0 in | Wt 141.0 lb

## 2019-10-15 DIAGNOSIS — R079 Chest pain, unspecified: Secondary | ICD-10-CM

## 2019-10-15 DIAGNOSIS — R002 Palpitations: Secondary | ICD-10-CM | POA: Diagnosis not present

## 2019-10-15 MED ORDER — VERAPAMIL HCL ER 120 MG PO TBCR: 120.0000 mg | EXTENDED_RELEASE_TABLET | Freq: Every day | ORAL | 1 refills | Status: AC

## 2019-10-15 NOTE — Patient Instructions (Signed)
Medication Instructions:   START Calan 120 mg daily  *If you need a refill on your cardiac medications before your next appointment, please call your pharmacy*  Lab Work: NONE ordered at this time of appointment   If you have labs (blood work) drawn today and your tests are completely normal, you will receive your results only by: [name] MyChart Message (if you have MyChart) OR . A paper copy in the mail If you have any lab test that is abnormal or we need to change your treatment, we will call you to review the results.  Testing/Procedures: NONE ordered at this time of appointment   Follow-Up: At [facility], you and your health needs are our priority.  As part of our continuing mission to provide you with exceptional heart care, we have created designated Provider Care Teams.  These Care Teams include your primary Cardiologist (physician) and Advanced Practice Providers (APPs -  Physician Assistants and Nurse Practitioners) who all work together to provide you with the care you need, when you need it.  Your next appointment:   12 months  The format for your next appointment:   In Person  Provider:   You may see Minus Breeding, MD or one of the following Advanced Practice Providers on your designated Care Team:    Rosaria Ferries, PA-C  Jory Sims, DNP, ANP  Cadence Kathlen Mody, NP  Other Instructions

## 2019-10-28 ENCOUNTER — Ambulatory Visit: Payer: Medicare Other | Admitting: Obstetrics & Gynecology

## 2019-10-29 ENCOUNTER — Telehealth: Payer: Self-pay | Admitting: Family Medicine

## 2019-10-29 NOTE — Telephone Encounter (Signed)
-----   Message from Ophelia Shoulder, [place] sent at 10/29/2019  9:57 AM EST ----- Regarding: RE: "New PCP?" Call several times no answer left a message to give office a call back ----- Message ----- From: Azzie Glatter, FNP Sent: 10/29/2019   6:20 AM EST To: Ophelia Shoulder, [place] Subject: "New PCP?"                                     Please assess if patient is seeing another PCP. Patient recently had appointment with PCP at [facility] on 09/30/2019 and she does not have follow up appointment with our office. If so, please change to new PCP in chart, so that correspondent discharge office visit notes from other medical facilities, will be routed to her new physician. Thanks Rote!

## 2019-10-30 ENCOUNTER — Other Ambulatory Visit: Payer: Self-pay

## 2019-10-30 ENCOUNTER — Ambulatory Visit (INDEPENDENT_AMBULATORY_CARE_PROVIDER_SITE_OTHER): Payer: Medicaid Other | Admitting: Psychiatry

## 2019-10-30 DIAGNOSIS — F39 Unspecified mood [affective] disorder: Secondary | ICD-10-CM | POA: Diagnosis not present

## 2019-10-30 DIAGNOSIS — F419 Anxiety disorder, unspecified: Secondary | ICD-10-CM | POA: Diagnosis not present

## 2019-10-30 NOTE — Progress Notes (Signed)
Virtual Visit via Video Note  I connected with Stephanie Serrano on 10/30/19 at 11:00 AM EST by a video enabled telemedicine application and verified that I am speaking with the correct person using two identifiers.   I discussed the limitations of evaluation and management by telemedicine and the availability of in person appointments. The patient expressed understanding and agreed to proceed.   [facility] Initial Assessment Note  Stephanie Serrano [id number] 52 y.o.  10/30/2019 11:14 AM  Chief Complaint:  I have heart palpitation.  A lot of health issues going on.  History of Present Illness:  Stephanie Serrano is 53 year old female who is unemployed referred from [facility] for the management of her psychiatric issues.  Patient denies that she has any psych problems.  She is guarded and superficially cooperative.  She feels that her past few months having heart palpitation with increased urination, loose stool, nervousness and waking up in the morning but did not specify any stressors.  She admitted seeing a doctor who told that she has anxiety and prescribed medicine but she is not convinced that she has anxiety issues.  She is relieved that finally she was able to see endocrinologist and despite her thyroid function is normal but she was told that she may have abdominal tumor.  Patient appears very labile and emotional.  She did not provide much information and most of the information was obtained through electronic medical record.  She told that 3 months ago she was seen by [facility] who [facility] Latuda and after taking for 10 days she developed side effects and she called pharmaceutical company and she was told that she has akathisia from [place].  Later on patient told that she has heart issues more than 2 years when her 31 year old daughter died.  She do not mention that she had a history of psychiatric inpatient however when I confronted then she told that  yes she was admitted because at that time she was going through grief after losing her 16 year old daughter.  However in the hospital discharge summary there is no mention about her daughter death.  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  Patient was also seen emergency room on October 28 when she mentioned that she is having suicidal thoughts however when confronted patient told that she was having heart issues and she never mentioned suicidal thoughts.  Recently her PA at [place] started her on sertraline 25 mg and metoprolol to help her anxiety and palpitation.  So far patient admitted that she is taking and helping her anxiety.  Patient denies any paranoia, hallucination, suicidal thoughts or homicidal thought.  She denies any nightmares or flashbacks.  She remains very guarded about her family.  Patient told she lives with her 36 year old daughter.  She reported her support system is her cousins and uncles.  Her father is deceased and her mother lives in [place].  Her ex-husband lives in [place].  In question why she got separated patient told we have in discrepancy in our life.  Patient also did not specify past psychiatric medication however as per record she has tried Cymbalta, Klonopin, Lamictal, Prozac, hydroxyzine but she mention all these medicines stopped due to side effects.  In 2018 when she was discharged she was recommended to start therapy at [facility] which she done for a while and then  she stopped because she felt she does not need it.  Patient reported her appetite is okay.  Her energy level is increased but patient appears with fast speech and at times emotional and irrational.  However there were no hallucination or suicidal thoughts present at this time.  She is more focused about her physical symptoms.  She mentioned recently she has medication side  effects from [facility] which was given for UTI.  Past Psychiatric History: Patient denies any history of suicidal attempt.  As per chart she was admitted to [facility] [place] in 2018 because of agitation, suicidal thoughts and diagnosed herself disassociative identity disorder.  She was discharged on Lamictal, Prozac, trazodone, hydroxyzine and Klonopin but she stopped all these medication claiming she has side effects.  Patient was also given Cymbalta, by PCP but she stopped.  [facility]uda [facility] which she took for 10 days.  Recently PA started on Zoloft 25 mg daily.  Family History; Mother has depression.  Daughter died due to drug overdose.   Past Medical History:  Diagnosis Date  . Allergy   . Anemia   . Anxiety   . Arthritis   . Asthma   . Blood transfusion without reported diagnosis   . Depression   . Eczema   . Esophageal stricture   . GERD (gastroesophageal reflux disease)   . Migraines   . OCD (obsessive compulsive disorder)   . RLS (restless legs syndrome)   . Vertigo      Traumatic brain injury: Patient denies any history of traumatic brain injury.  Education and Work History; Patient told that she had work at [employer] and [employer] but claims to be disabled due to multiple foot surgery and nerve pain.  Psychosocial History; Patient born in [place].  She did not provide much information about the family but told that her 110 year old daughter died in her sleep due to drug overdose.  Her mother lives in [place].  Father is deceased.  Her ex-husband lives in [place].  Patient lives with her 52 year old daughter.  Legal History; Denies any legal issues.  History Of Abuse; Denies any history of abuse.  Substance Abuse History; Denies any substance use.   Outpatient Encounter Medications as of 10/30/2019  Medication Sig  . albuterol (PROVENTIL HFA;VENTOLIN HFA) 108 (90 Base) MCG/ACT inhaler Inhale 2 puffs into the  lungs every 6 (six) hours as needed for wheezing or shortness of breath.  . ALPRAZolam (XANAX) 1 MG tablet Take 1 mg by mouth 2 (two) times daily as needed for anxiety.   . DULoxetine (CYMBALTA) 30 MG capsule Take 30 mg by mouth daily.  [name] levalbuterol (XOPENEX HFA) 45 MCG/ACT inhaler Inhale 2 puffs into the lungs every 6 (six) hours as needed for wheezing.  . polyethylene glycol (MIRALAX) packet Take 17 g by mouth daily. (Patient not taking: Reported on 09/03/2019)  . verapamil (CALAN SR) 120 MG CR tablet Take 1 tablet (120 mg total) by mouth at bedtime.   Facility-Administered Encounter Medications as of 10/30/2019  Medication  . dupilumab (DUPIXENT) prefilled syringe 300 mg    Recent Results (from the past 2160 hour(s))  CBC with Differential     Status: None   Collection Time: 08/03/19  2:44 PM  Result Value Ref Range   WBC 6.4 3.4 - 10.8 x10E3/uL   RBC 4.96 3.77 - 5.28 x10E6/uL   Hemoglobin 14.2 11.1 - 15.9 g/dL   Hematocrit 42.3 34.0 - 46.6 %  MCV 85 79 - 97 fL   MCH 28.6 26.6 - 33.0 pg   MCHC 33.6 31.5 - 35.7 g/dL   RDW 13.5 11.7 - 15.4 %   Platelets 298 150 - 450 x10E3/uL   Neutrophils 46 Not Estab. %   Lymphs 44 Not Estab. %   Monocytes 6 Not Estab. %   Eos 3 Not Estab. %   Basos 1 Not Estab. %   Neutrophils Absolute 2.9 1.4 - 7.0 x10E3/uL   Lymphocytes Absolute 2.8 0.7 - 3.1 x10E3/uL   Monocytes Absolute 0.4 0.1 - 0.9 x10E3/uL   EOS (ABSOLUTE) 0.2 0.0 - 0.4 x10E3/uL   Basophils Absolute 0.1 0.0 - 0.2 x10E3/uL   Immature Granulocytes 0 Not Estab. %   Immature Grans (Abs) 0.0 0.0 - 0.1 x10E3/uL  Comprehensive metabolic panel     Status: Abnormal   Collection Time: 08/03/19  2:44 PM  Result Value Ref Range   Glucose 95 65 - 99 mg/dL   BUN 8 6 - 24 mg/dL   Creatinine, Ser 0.85 0.57 - 1.00 mg/dL   GFR calc non Af Amer 79 >59 mL/min/1.73   GFR calc Af Amer 91 >59 mL/min/1.73   BUN/Creatinine Ratio 9 9 - 23   Sodium 139 134 - 144 mmol/L   Potassium 4.5 3.5 - 5.2  mmol/L   Chloride 101 96 - 106 mmol/L   CO2 24 20 - 29 mmol/L   Calcium 9.7 8.7 - 10.2 mg/dL   Total Protein 7.0 6.0 - 8.5 g/dL   Albumin 4.9 3.8 - 4.9 g/dL   Globulin, Total 2.1 1.5 - 4.5 g/dL   Albumin/Globulin Ratio 2.3 (H) 1.2 - 2.2   Bilirubin Total 0.3 0.0 - 1.2 mg/dL   Alkaline Phosphatase 112 39 - 117 IU/L   AST 18 0 - 40 IU/L   ALT 17 0 - 32 IU/L  Lipid Panel     Status: Abnormal   Collection Time: 08/03/19  2:44 PM  Result Value Ref Range   Cholesterol, Total 310 (H) 100 - 199 mg/dL   Triglycerides 67 0 - 149 mg/dL   HDL 77 >39 mg/dL   VLDL Cholesterol Cal 13 5 - 40 mg/dL   LDL Calculated 220 (H) 0 - 99 mg/dL    Comment: **Effective August 10, 2019, [facility] is implementing an improved** equation to calculate Low Density Lipoprotein Cholesterol (LDL-C) concentrations, to be used in all lipid panels that report calculated LDL-C. This equation was developed through a collaboration with the [employer], Lung and [place] (NIH).[1] The NIH calculation overcomes the limitations of the existing Friedewald LDL-C equation and performs equally well in both fasting and non-fasting individuals. 1. Pauline Good Q, et al. A new equation for calculation of low-density lipoprotein cholesterol in patients with normolipidemia and/or hypertriglyceridemia. JAMA Cardiol. 2020 Feb 26. doi:10.1001/jamacardio.2020.0013    Comment: Comment     Comment: Possible Familial Hypercholesterolemia. FH should be suspected when fasting LDL cholesterol is above 189 mg/dL or non-HDL cholesterol is above 219 mg/dL. A family history of high cholesterol and heart disease in 1st degree relatives should be collected. J Clin Lipidol 2011;5:133-140    Chol/HDL Ratio 4.0 0.0 - 4.4 ratio    Comment:                                   T. Chol/HDL Ratio  Men  Women                               1/2 Avg.Risk  3.4    3.3                                    Avg.Risk  5.0    4.4                                2X Avg.Risk  9.6    7.1                                3X Avg.Risk 23.4   11.0   TSH     Status: None   Collection Time: 08/03/19  2:44 PM  Result Value Ref Range   TSH 1.250 0.450 - 4.500 uIU/mL  Vitamin B12     Status: None   Collection Time: 08/03/19  2:44 PM  Result Value Ref Range   Vitamin B-12 1,003 232 - 1,245 pg/mL  Vitamin D, 25-hydroxy     Status: Abnormal   Collection Time: 08/03/19  2:44 PM  Result Value Ref Range   Vit D, 25-Hydroxy 22.5 (L) 30.0 - 100.0 ng/mL    Comment: Vitamin D deficiency has been defined by the [place] and an Endocrine Society practice guideline as a level of serum 25-OH vitamin D less than 20 ng/mL (1,2). The Endocrine Society went on to further define vitamin D insufficiency as a level between 21 and 29 ng/mL (2). 1. IOM (Institute of Medicine). 2010. Dietary reference    intakes for calcium and D. [place]: The    [employer]. 2. Holick MF, Binkley [place], Bischoff-Ferrari HA, et al.    Evaluation, treatment, and prevention of vitamin D    deficiency: an Endocrine Society clinical practice    guideline. JCEM. 2011 Jul; 96(7):1911-30.   Urinalysis Dipstick     Status: Normal   Collection Time: 09/04/19  9:09 AM  Result Value Ref Range   Color, UA     Clarity, UA     Glucose, UA Negative Negative   Bilirubin, UA neg    Ketones, UA trace    Spec Grav, UA 1.020 1.010 - 1.025   Blood, UA neg    pH, UA 7.5 5.0 - 8.0   Protein, UA Negative Negative   Urobilinogen, UA 0.2 0.2 or 1.0 E.U./dL   Nitrite, UA neg    Leukocytes, UA Negative Negative   Appearance     Odor    CBC with Differential     Status: None   Collection Time: 09/04/19  9:57 AM  Result Value Ref Range   WBC 5.6 3.4 - 10.8 x10E3/uL   RBC 4.92 3.77 - 5.28 x10E6/uL   Hemoglobin 13.7 11.1 - 15.9 g/dL   Hematocrit 41.1 34.0 - 46.6 %   MCV 84 79 - 97 fL   MCH 27.8 26.6 - 33.0 pg    MCHC 33.3 31.5 - 35.7 g/dL   RDW 14.5 11.7 - 15.4 %   Platelets 285 150 - 450 x10E3/uL   Neutrophils 58 Not Estab. %   Lymphs 33 Not Estab. %   Monocytes 7 Not Estab. %  Eos 1 Not Estab. %   Basos 1 Not Estab. %   Neutrophils Absolute 3.3 1.4 - 7.0 x10E3/uL   Lymphocytes Absolute 1.9 0.7 - 3.1 x10E3/uL   Monocytes Absolute 0.4 0.1 - 0.9 x10E3/uL   EOS (ABSOLUTE) 0.0 0.0 - 0.4 x10E3/uL   Basophils Absolute 0.1 0.0 - 0.2 x10E3/uL   Immature Granulocytes 0 Not Estab. %   Immature Grans (Abs) 0.0 0.0 - 0.1 x10E3/uL  Lipid Panel     Status: Abnormal   Collection Time: 09/04/19  9:57 AM  Result Value Ref Range   Cholesterol, Total 265 (H) 100 - 199 mg/dL   Triglycerides 69 0 - 149 mg/dL   HDL 68 >39 mg/dL   VLDL Cholesterol Cal 11 5 - 40 mg/dL   LDL Chol Calc (NIH) 186 (H) 0 - 99 mg/dL   Chol/HDL Ratio 3.9 0.0 - 4.4 ratio    Comment:                                   T. Chol/HDL Ratio                                             Men  Women                               1/2 Avg.Risk  3.4    3.3                                   Avg.Risk  5.0    4.4                                2X Avg.Risk  9.6    7.1                                3X Avg.Risk 23.4   11.0   TSH     Status: None   Collection Time: 09/04/19  9:57 AM  Result Value Ref Range   TSH 1.450 0.450 - 4.500 uIU/mL  Comprehensive metabolic panel     Status: Abnormal   Collection Time: 09/04/19  9:57 AM  Result Value Ref Range   Glucose 110 (H) 65 - 99 mg/dL   BUN 7 6 - 24 mg/dL   Creatinine, Ser 0.83 0.57 - 1.00 mg/dL   GFR calc non Af Amer 81 >59 mL/min/1.73   GFR calc Af Amer 94 >59 mL/min/1.73   BUN/Creatinine Ratio 8 (L) 9 - 23   Sodium 142 134 - 144 mmol/L   Potassium 4.3 3.5 - 5.2 mmol/L   Chloride 107 (H) 96 - 106 mmol/L   CO2 20 20 - 29 mmol/L   Calcium 9.5 8.7 - 10.2 mg/dL   Total Protein 6.7 6.0 - 8.5 g/dL   Albumin 4.4 3.8 - 4.9 g/dL   Globulin, Total 2.3 1.5 - 4.5 g/dL   Albumin/Globulin Ratio 1.9 1.2  - 2.2   Bilirubin Total 0.3 0.0 - 1.2 mg/dL   Alkaline Phosphatase 108 39 - 117 IU/L   AST 18  0 - 40 IU/L   ALT 12 0 - 32 IU/L  Basic metabolic panel     Status: Abnormal   Collection Time: 09/05/19  9:19 AM  Result Value Ref Range   Sodium 139 135 - 145 mmol/L   Potassium 3.3 (L) 3.5 - 5.1 mmol/L   Chloride 109 98 - 111 mmol/L   CO2 20 (L) 22 - 32 mmol/L   Glucose, Bld 153 (H) 70 - 99 mg/dL   BUN 5 (L) 6 - 20 mg/dL   Creatinine, Ser 0.78 0.44 - 1.00 mg/dL   Calcium 9.6 8.9 - 10.3 mg/dL   GFR calc non Af Amer >60 >60 mL/min   GFR calc Af Amer >60 >60 mL/min   Anion gap 10 5 - 15    Comment: Performed at [facility], [place] [address], [place] [address]  CBC     Status: None   Collection Time: 09/05/19  9:19 AM  Result Value Ref Range   WBC 7.8 4.0 - 10.5 K/uL   RBC 4.78 3.87 - 5.11 MIL/uL   Hemoglobin 13.6 12.0 - 15.0 g/dL   HCT 40.9 36.0 - 46.0 %   MCV 85.6 80.0 - 100.0 fL   MCH 28.5 26.0 - 34.0 pg   MCHC 33.3 30.0 - 36.0 g/dL   RDW 14.6 11.5 - 15.5 %   Platelets 336 150 - 400 K/uL   nRBC 0.0 0.0 - 0.2 %    Comment: Performed at [facility], [place] [address], [place], [place] [address]  Troponin I (High Sensitivity)     Status: None   Collection Time: 09/05/19  9:19 AM  Result Value Ref Range   Troponin I (High Sensitivity) <2 <18 ng/L    Comment: (NOTE) Elevated high sensitivity troponin I (hsTnI) values and significant  changes across serial measurements may suggest ACS but many other  chronic and acute conditions are known to elevate hsTnI results.  Refer to the Links section for chest pain algorithms and additional  guidance. Performed at [facility], [place] [address], [place],  [address]   I-Stat beta hCG blood, ED     Status: None   Collection Time: 09/05/19  9:26 AM  Result Value Ref Range   I-stat hCG, quantitative <5.0 <5 mIU/mL   Comment 3            Comment:   GEST. AGE      CONC.  (mIU/mL)   <=1 WEEK        5 - 50     2  WEEKS       50 - 500     3 WEEKS       100 - 10,000     4 WEEKS     1,000 - 30,000        FEMALE AND NON-PREGNANT FEMALE:     LESS THAN 5 mIU/mL   Hepatic function panel     Status: None   Collection Time: 09/05/19 10:00 AM  Result Value Ref Range   Total Protein 6.6 6.5 - 8.1 g/dL   Albumin 3.9 3.5 - 5.0 g/dL   AST 20 15 - 41 U/L   ALT 17 0 - 44 U/L   Alkaline Phosphatase 82 38 - 126 U/L   Total Bilirubin 0.7 0.3 - 1.2 mg/dL   Bilirubin, Direct 0.1 0.0 - 0.2 mg/dL   Indirect Bilirubin 0.6 0.3 - 0.9 mg/dL    Comment: Performed at  [facility], [place] [address], [place], [place] [address]  D-dimer, quantitative (not at [facility])     Status: None   Collection Time: 09/05/19 10:00 AM  Result Value Ref Range   D-Dimer, Quant <0.27 0.00 - 0.50 ug/mL-FEU    Comment: (NOTE) At the manufacturer cut-off of 0.50 ug/mL FEU, this assay has been documented to exclude PE with a sensitivity and negative predictive value of 97 to 99%.  At this time, this assay has not been approved by the FDA to exclude DVT/VTE. Results should be correlated with clinical presentation. Performed at [facility], [place] [address] [place], [place] [address]   CK     Status: None   Collection Time: 09/05/19 10:00 AM  Result Value Ref Range   Total CK 178 38 - 234 U/L    Comment: Performed at [facility], [place] [address], [place], [place] [address]  Urinalysis, Routine w reflex microscopic     Status: Abnormal   Collection Time: 09/05/19 11:02 AM  Result Value Ref Range   Color, Urine COLORLESS (A) YELLOW   APPearance CLEAR CLEAR   Specific Gravity, Urine 1.002 (L) 1.005 - 1.030   pH 9.0 (H) 5.0 - 8.0   Glucose, UA NEGATIVE NEGATIVE mg/dL   Hgb urine dipstick NEGATIVE NEGATIVE   Bilirubin Urine NEGATIVE NEGATIVE   Ketones, ur NEGATIVE NEGATIVE mg/dL   Protein, ur NEGATIVE NEGATIVE mg/dL   Nitrite NEGATIVE NEGATIVE   Leukocytes,Ua NEGATIVE NEGATIVE    Comment: Performed at [facility] [address] [place],  [address]  Novel Coronavirus, NAA (Hosp order, Send-out to Ref Lab; TAT 18-24 hrs     Status: None   Collection Time: 09/26/19 11:10 AM   Specimen: Nasopharyngeal Swab; Respiratory  Result Value Ref Range   SARS-CoV-2, NAA NOT DETECTED NOT DETECTED    Comment: (NOTE) This nucleic acid amplification test was developed and its performance characteristics determined by [employer]. Nucleic acid amplification tests include PCR and TMA. This test has not been FDA cleared or approved. This test has been authorized by FDA under an Emergency Use Authorization (EUA). This test is only authorized for the duration of time the declaration that circumstances exist justifying the authorization of the emergency use of in vitro diagnostic tests for detection of SARS-CoV-2 virus and/or diagnosis of COVID-19 infection under section 564(b)(1) of the Act, 21 U.S.C. [id number]) (1), unless the authorization is terminated or revoked sooner. When diagnostic testing is negative, the possibility of a false negative result should be considered in the context of a patient's recent exposures and the presence of clinical signs and symptoms consistent with COVID-19. An individual without symptoms of COVID- 19 and who is not shedding SARS-CoV-2 vi rus would expect to have a negative (not detected) result in this assay. Performed At: [facility] [place], [place] [id number] Rush Farmer MD [id number]    Coronavirus Source NASOPHARYNGEAL     Comment: Performed at [facility], [place] [address] [place], [place] [address]  Exercise Tolerance Test     Status: None   Collection Time: 09/30/19 10:41 AM  Result Value Ref Range   Rest HR 93 bpm   Rest BP 115/84 mmHg   Exercise duration (sec) 30 sec   Percent HR 98 %   Exercise duration (min) 9 min   Estimated workload 10.9 METS   Peak HR 166 bpm   Peak BP 137/92 mmHg   MPHR  168 bpm  CBC  with Differential     Status: None   Collection Time: 10/07/19  7:54 AM  Result Value Ref Range   WBC 10.4 4.0 - 10.5 K/uL   RBC 4.75 3.87 - 5.11 MIL/uL   Hemoglobin 13.8 12.0 - 15.0 g/dL   HCT 40.2 36.0 - 46.0 %   MCV 84.6 80.0 - 100.0 fL   MCH 29.1 26.0 - 34.0 pg   MCHC 34.3 30.0 - 36.0 g/dL   RDW 14.4 11.5 - 15.5 %   Platelets 357 150 - 400 K/uL   nRBC 0.0 0.0 - 0.2 %   Neutrophils Relative % 68 %   Neutro Abs 7.0 1.7 - 7.7 K/uL   Lymphocytes Relative 24 %   Lymphs Abs 2.5 0.7 - 4.0 K/uL   Monocytes Relative 8 %   Monocytes Absolute 0.8 0.1 - 1.0 K/uL   Eosinophils Relative 0 %   Eosinophils Absolute 0.0 0.0 - 0.5 K/uL   Basophils Relative 0 %   Basophils Absolute 0.0 0.0 - 0.1 K/uL   Immature Granulocytes 0 %   Abs Immature Granulocytes 0.02 0.00 - 0.07 K/uL    Comment: Performed at [facility], [place] [address], [place], [place] [address]  Comprehensive metabolic panel     Status: Abnormal   Collection Time: 10/07/19  7:54 AM  Result Value Ref Range   Sodium 138 135 - 145 mmol/L   Potassium 3.6 3.5 - 5.1 mmol/L   Chloride 105 98 - 111 mmol/L   CO2 18 (L) 22 - 32 mmol/L   Glucose, Bld 133 (H) 70 - 99 mg/dL   BUN 9 6 - 20 mg/dL   Creatinine, Ser 0.71 0.44 - 1.00 mg/dL   Calcium 9.5 8.9 - 10.3 mg/dL   Total Protein 7.3 6.5 - 8.1 g/dL   Albumin 4.5 3.5 - 5.0 g/dL   AST 27 15 - 41 U/L   ALT 19 0 - 44 U/L   Alkaline Phosphatase 90 38 - 126 U/L   Total Bilirubin 0.9 0.3 - 1.2 mg/dL   GFR calc non Af Amer >60 >60 mL/min   GFR calc Af Amer >60 >60 mL/min   Anion gap 15 5 - 15    Comment: Performed at [facility], [place] [address] [place], [place] [address]  Lipase, blood     Status: None   Collection Time: 10/07/19  7:54 AM  Result Value Ref Range   Lipase 25 11 - 51 U/L    Comment: Performed at [facility], [place] [address], [place], [place] [address]  I-Stat Beta hCG blood, ED ([facility], [facility], [facility] only)     Status: None    Collection Time: 10/07/19  8:23 AM  Result Value Ref Range   I-stat hCG, quantitative <5.0 <5 mIU/mL   Comment 3            Comment:   GEST. AGE      CONC.  (mIU/mL)   <=1 WEEK        5 - 50     2 WEEKS       50 - 500     3 WEEKS       100 - 10,000     4 WEEKS     1,000 - 30,000        FEMALE AND NON-PREGNANT FEMALE:     LESS THAN 5 mIU/mL   Urinalysis, Routine w reflex microscopic  Status: Abnormal   Collection Time: 10/07/19  1:26 PM  Result Value Ref Range   Color, Urine YELLOW YELLOW   APPearance HAZY (A) CLEAR   Specific Gravity, Urine 1.011 1.005 - 1.030   pH 5.0 5.0 - 8.0   Glucose, UA NEGATIVE NEGATIVE mg/dL   Hgb urine dipstick NEGATIVE NEGATIVE   Bilirubin Urine NEGATIVE NEGATIVE   Ketones, ur 20 (A) NEGATIVE mg/dL   Protein, ur NEGATIVE NEGATIVE mg/dL   Nitrite NEGATIVE NEGATIVE   Leukocytes,Ua NEGATIVE NEGATIVE    Comment: Performed at [facility] [address], [place], [place] [address]  Urine culture     Status: Abnormal   Collection Time: 10/07/19  1:26 PM   Specimen: Urine, Clean Catch  Result Value Ref Range   Specimen Description      URINE, CLEAN CATCH Performed at [facility], [place] [address], [place], [place] [address]    Special Requests      NONE Performed at [facility], [place] [address] [place], [place] [address]    Culture (A)     <10,000 COLONIES/mL INSIGNIFICANT GROWTH Performed at [place] [address] [place], [place] [address]    Report Status 10/08/2019 FINAL       Constitutional:  LMP 01/17/2017 Comment: NEG BETA HCG 10/07/19   Musculoskeletal: Strength & Muscle Tone: within normal limits Gait & Station: normal Patient leans: N/A  Psychiatric Specialty Exam: Physical Exam  Review of Systems  Cardiovascular: Positive for palpitations.  Gastrointestinal: Positive for diarrhea.  Genitourinary: Positive for frequency.  Neurological: Positive for  tingling.  Psychiatric/Behavioral: The patient is nervous/anxious and has insomnia.     Last menstrual period 01/17/2017.There is no height or weight on file to calculate BMI.  General Appearance: Guarded and superfical cooprerative  Eye Contact:  Fair  Speech:  Normal Rate and fast  Volume:  Increased  Mood:  Anxious and Irritable  Affect:  Labile  Thought Process:  Descriptions of Associations: Intact  Orientation:  Full (Time, Place, and Person)  Thought Content:  Rumination and obcessed about physcial symptoms  Suicidal Thoughts:  No  Homicidal Thoughts:  No  Memory:  Immediate;   Good Recent;   Good Remote;   Good  Judgement:  Fair  Insight:  Fair  Psychomotor Activity:  Increased  Concentration:  Concentration: Fair and Attention Span: Fair  Recall:  [employer] of Knowledge:  Fair  Language:  Good  Akathisia:  No  Handed:  Right  AIMS (if indicated):     Assets:  Communication Skills Desire for Improvement Social Support  ADL's:  Intact  Cognition:  WNL  Sleep:   fair    Assessment and Plan: Demarie is 53 year old female with a history of psychiatric symptoms.  Patient is very guarded and superficially cooperative.  She appears emotional and minimizes her past history.  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  Currently she is taking Zoloft 35 mg and metoprolol to help her palpitation.  She like to continue work-up with the endocrinologist to find out reason for her heart palpitation.  She also seen cardiology and had a heart monitor.  I strongly encouraged that she should restart therapy with Valora Piccolo.  Patient is not interested to start any new medication however she agreed to continue Zoloft from [place].  I encouraged that  if Zoloft helping then she should stay on that medication.  Patient is not interested for future appointments however  agreed that she will reconnect with therapy.  I discussed medication side effects and benefits.  At this time patient is not in danger and however limited insight into her illness.  She believes Zoloft helping her anxiety.  We discussed that in case symptoms started to get worse or in the future if she need any help then she should call us to schedule appointment.  We discussed safety concerns and anytime having active suicidal thoughts or homicidal thought that she need to call 911 or go to local emergency room.  We will not schedule appointment at this time until patient called and requested that appointment.  Follow Up Instructions:    I discussed the assessment and treatment plan with the patient. The patient was provided an opportunity to ask questions and all were answered. The patient agreed with the plan and demonstrated an understanding of the instructions.   The patient was advised to call back or seek an in-person evaluation if the symptoms worsen or if the condition fails to improve as anticipated.  I provided 55 minutes of non-face-to-face time during this encounter.   Kathlee Nations, MD

## 2019-11-02 ENCOUNTER — Ambulatory Visit: Payer: Self-pay | Admitting: Family Medicine

## 2019-11-29 ENCOUNTER — Other Ambulatory Visit: Payer: Self-pay | Admitting: Cardiology

## 2019-12-04 ENCOUNTER — Other Ambulatory Visit: Payer: Self-pay | Admitting: Family Medicine

## 2019-12-04 DIAGNOSIS — E876 Hypokalemia: Secondary | ICD-10-CM

## 2019-12-04 DIAGNOSIS — E785 Hyperlipidemia, unspecified: Secondary | ICD-10-CM

## 2019-12-07 NOTE — Telephone Encounter (Signed)
Please review refill request: [facility]

## 2019-12-08 ENCOUNTER — Telehealth: Payer: Self-pay | Admitting: Family Medicine

## 2019-12-08 NOTE — Telephone Encounter (Signed)
Called Pt left a message for her to call us back

## 2019-12-08 NOTE — Telephone Encounter (Signed)
Pt said she told [facility] to cancel the prescription and she said she no longer  a Patient at this clinic

## 2019-12-14 ENCOUNTER — Other Ambulatory Visit: Payer: Self-pay

## 2019-12-14 ENCOUNTER — Ambulatory Visit: Payer: Medicare Other | Attending: Orthopedic Surgery | Admitting: Physical Therapy

## 2019-12-14 ENCOUNTER — Ambulatory Visit: Payer: Medicare Other | Admitting: Physical Therapy

## 2019-12-14 ENCOUNTER — Encounter: Payer: Self-pay | Admitting: Physical Therapy

## 2019-12-14 DIAGNOSIS — M25562 Pain in left knee: Secondary | ICD-10-CM | POA: Diagnosis not present

## 2019-12-14 DIAGNOSIS — G8929 Other chronic pain: Secondary | ICD-10-CM

## 2019-12-14 DIAGNOSIS — M6281 Muscle weakness (generalized): Secondary | ICD-10-CM

## 2019-12-14 NOTE — Therapy (Signed)
[facility] [address] [place], [place], [address] Phone: [phone]   Fax:  [phone]  Physical Therapy Evaluation  Patient Details  Name: Stephanie Serrano MRN: [id number] Date of Birth: 07/20/1966 Referring Provider (PT): John Lee Graves, MD   Encounter Date: 12/14/2019  PT End of Session - 12/14/19 1429    Visit Number  1    Number of Visits  17    Date for PT Re-Evaluation  02/12/20    Authorization Type  MCR/MCD    PT Start Time  1420   pt arrived late   PT Stop Time  1503    PT Time Calculation (min)  43 min    Activity Tolerance  Patient tolerated treatment well    Behavior During Therapy  WFL for tasks assessed/performed       Past Medical History:  Diagnosis Date  . Allergy   . Anemia   . Anxiety   . Arthritis   . Asthma   . Blood transfusion without reported diagnosis   . Depression   . Eczema   . Esophageal stricture   . GERD (gastroesophageal reflux disease)   . Migraines   . OCD (obsessive compulsive disorder)   . RLS (restless legs syndrome)   . Vertigo     Past Surgical History:  Procedure Laterality Date  . ESOPHAGOGASTRODUODENOSCOPY N/A 02/09/2015   Procedure: ESOPHAGOGASTRODUODENOSCOPY (EGD);  Surgeon: Robert D Kaplan, MD;  Location: [facility];  Service: Endoscopy;  Laterality: N/A;  with dilation  . FOOT SURGERY     bone spurs  . NASAL SINUS SURGERY    . PLANTAR FASCIA RELEASE Right 08/03/2016   Procedure: PLANTAR FASCIA RELEASE WITH BONE SPUR EXCISION;  Surgeon: John Graves, MD;  Location:  SURGERY CENTER;  Service: Orthopedics;  Laterality: Right;  . TONSILLECTOMY      There were no vitals filed for this visit.   Subjective Assessment - 12/14/19 1426    Subjective  Fell on cement on everted ankle in Feb. Broke ankle and had knee pain but focused on ankle. Was in a cast 6 weeks and boot 6 weeks. Denies knee pain before fall. Sharp pain, always on medial knee, hurts to get  into/out of car- almost like something pulling or tearing. Reports LBP, has been seeing chiropractor but has not been manipulated.    Pertinent History  bil plantar fascia release 2017x3 with bone spur removal    How long can you sit comfortably?  not long    How long can you walk comfortably?  feels better to walk    Patient Stated Goals  stability through hips, no pain with sitting, stairs (lives on 3rd floor)    Currently in Pain?  Yes    Pain Score  8     Pain Location  Knee    Pain Orientation  Left;Medial    Multiple Pain Sites  Yes    Pain Score  8    Pain Location  Back    Pain Orientation  Right;Lower    Pain Descriptors / Indicators  --   points to SIJ   Aggravating Factors   being still    Pain Relieving Factors  moving around         [facility] PT Assessment - 12/14/19 0001      Assessment   Medical Diagnosis  Lt knee pain    Referring Provider (PT)  John Lee Graves, MD    Hand Dominance  Right        Precautions   Precautions  None      Restrictions   Weight Bearing Restrictions  No      Home Environment   Living Environment  Private residence      Prior Function   Level of [place]  On disability   used to work in a gym     Cognition   Overall Cognitive Status  Within Functional Limits for tasks assessed      Observation/Other Assessments   Focus on Therapeutic Outcomes (FOTO)   84% limited      Posture/Postural Control   Posture Comments  Lt post innom rotation resulting in functional LLD (Lt longer)      ROM / Strength   AROM / PROM / Strength  AROM;Strength      Strength   Overall Strength Comments  not appropriate to test due to notable pelvic rotation    Strength Assessment Site  Hip;Knee;Ankle    Right/Left Hip  Right;Left    Right/Left Knee  Right;Left    Right/Left Ankle  Right;Left      Palpation   Palpation comment  TTP Rt SIJ                Objective measurements completed on examination: See  above findings.      [facility] Adult PT Treatment/Exercise - 12/14/19 0001      Manual Therapy   Manual Therapy  Muscle Energy Technique    Muscle Energy Technique  Lt hip flexors/Rt extensors             PT Education - 12/14/19 2040    Education Details  anatomy of condition POC, HEP, exercise form/rationale, available modalities    Person(s) Educated  Patient    Methods  Explanation;Demonstration;Tactile cues;Verbal cues;Handout    Comprehension  Verbalized understanding;Returned demonstration;Verbal cues required;Tactile cues required;Need further instruction       PT Short Term Goals - 12/14/19 2054      PT SHORT TERM GOAL #1   Title  pt will be independent in self correction utilizing MET    Baseline  began educating at eval    Time  4    Period  Weeks    Status  New    Target Date  01/15/20      PT SHORT TERM GOAL #2   Title  pt will report ability to correct posture and maintain equal pressure    Baseline  began discussing at eval    Time  4    Period  Weeks    Status  New    Target Date  01/15/20        PT Long Term Goals - 12/14/19 2056      PT LONG TERM GOAL #1   Title  gross LE strength to 5/5    Baseline  not appropriate to test at eval due to notable pelvic rotation    Time  8    Period  Weeks    Status  New    Target Date  02/12/20      PT LONG TERM GOAL #2   Title  able to climb stairs to her apartment without knee pain    Baseline  pain at eval    Time  8    Period  Weeks    Status  New    Target Date  02/12/20      PT LONG TERM GOAL #3   Title  pt will be  able to maintain a seated position for at least 20 min comfortably    Baseline  unable comfortably at eval    Time  8    Period  Weeks    Status  New    Target Date  02/12/20      PT LONG TERM GOAL #4   Title  pt will be able to complete household chores without increase in knee pain    Baseline  significant pain at eval    Time  8    Period  Weeks    Status  New    Target  Date  02/12/20             Plan - 12/14/19 2042    Clinical Impression Statement  Pt presents to PT with complaints of medial, Lt knee pain that began in Feb when she stepped off of a curb and fractured her ankle. She also complains of Rt SIJ. Notable innominate rotation corrected with MET today which decreased SIJ pain as well as knee pain. Pt will benefit from skilled PT in order to improve lumbopelvic stability to decrease proximal contribution to knee pain. Pt reports MD diagnosed meniscal tear in her knee. She also had surgery for plantar fascia release as well as bone spur removal on 3 occasions in 2017 and will benefit from ankle stability.    Personal Factors and Comorbidities  Comorbidity 1    Comorbidities  h/o plantar fascia release, OCD, anxiety    Examination-Activity Limitations  Bed Mobility;Bend;Sit;Sleep;Squat;Stairs;Stand;Transfers;Locomotion Level    Examination-Participation Restrictions  Cleaning;Driving;Other    Stability/Clinical Decision Making  Stable/Uncomplicated    Clinical Decision Making  Low    Rehab Potential  Good    PT Frequency  2x / week    PT Duration  8 weeks    PT Treatment/Interventions  ADLs/Self Care Home Management;Cryotherapy;Electrical Stimulation;Iontophoresis 4mg/ml Dexamethasone;Stair training;Functional mobility training;Gait training;Ultrasound;Traction;Moist Heat;Therapeutic activities;Therapeutic exercise;Balance training;Neuromuscular re-education;Patient/family education;Passive range of motion;Manual techniques;Dry needling;Taping;Spinal Manipulations;Joint Manipulations    PT Next Visit Plan  check pelvic rotation, lumbopelvic stability    PT Home Exercise Plan  MET PRN, posture/equal pressure stance    Consulted and Agree with Plan of Care  Patient       Patient will benefit from skilled therapeutic intervention in order to improve the following deficits and impairments:  Difficulty walking, Increased muscle spasms, Decreased  activity tolerance, Pain, Improper body mechanics, Decreased balance, Decreased strength, Postural dysfunction  Visit Diagnosis: Chronic pain of left knee - Plan: PT plan of care cert/re-cert  Muscle weakness (generalized) - Plan: PT plan of care cert/re-cert     Problem List Patient Active Problem List   Diagnosis Date Noted  . Chest pain of uncertain etiology 10/14/2019  . Heart palpitations 09/05/2019  . Shortness of breath 09/05/2019  . Anxiety 09/05/2019  . Tachycardia 07/23/2019  . Sprain of right ankle 02/03/2019  . MDD (major depressive disorder), recurrent episode, moderate (HCC) 06/28/2017  . OCD (obsessive compulsive disorder) 03/26/2017  . Obstructive sleep apnea of adult 01/18/2017  . Insomnia 02/08/2016  . Heel spur 09/01/2015  . Family history of cystitis 08/31/2015  . Left foot pain 08/31/2015  . Prediabetes 08/23/2015  . Hyperlipidemia 08/23/2015  . Depression 03/21/2015  . Acute esophagitis 02/09/2015  . Gastroesophageal reflux disease without esophagitis 08/27/2014  . Dysphagia, unspecified(787.20) 03/22/2014     C.  PT, DPT 12/14/19 9:08 PM   [facility] [address] [place], [place], [address] Phone: [phone]     Fax:  [phone]  Name: Stephanie Serrano MRN: [id number] Date of Birth: 08-15-1966

## 2019-12-25 ENCOUNTER — Encounter: Payer: Self-pay | Admitting: Physical Therapy

## 2019-12-25 ENCOUNTER — Ambulatory Visit: Payer: Medicare Other | Admitting: Physical Therapy

## 2019-12-25 ENCOUNTER — Other Ambulatory Visit: Payer: Self-pay

## 2019-12-25 DIAGNOSIS — M25562 Pain in left knee: Secondary | ICD-10-CM | POA: Diagnosis not present

## 2019-12-25 DIAGNOSIS — M6281 Muscle weakness (generalized): Secondary | ICD-10-CM

## 2019-12-25 DIAGNOSIS — G8929 Other chronic pain: Secondary | ICD-10-CM

## 2019-12-25 NOTE — Therapy (Signed)
[place], [place], [address] Phone: [phone]   Fax:  [phone]  Physical Therapy Treatment  Patient Details  Name: Stephanie Serrano MRN: [id number] Date of Birth: 04-26-1966 Referring Provider (PT): Lowella Petties, MD   Encounter Date: 12/25/2019  PT End of Session - 12/25/19 0935    Visit Number  2    Number of Visits  17    Date for PT Re-Evaluation  02/12/20    Authorization Type  MCR/MCD    PT Start Time  0932    PT Stop Time  1013    PT Time Calculation (min)  41 min    Activity Tolerance  Patient tolerated treatment well    Behavior During Therapy  [facility] for tasks assessed/performed       Past Medical History:  Diagnosis Date  . Allergy   . Anemia   . Anxiety   . Arthritis   . Asthma   . Blood transfusion without reported diagnosis   . Depression   . Eczema   . Esophageal stricture   . GERD (gastroesophageal reflux disease)   . Migraines   . OCD (obsessive compulsive disorder)   . RLS (restless legs syndrome)   . Vertigo     Past Surgical History:  Procedure Laterality Date  . ESOPHAGOGASTRODUODENOSCOPY N/A 02/09/2015   Procedure: ESOPHAGOGASTRODUODENOSCOPY (EGD);  Surgeon: Inda Castle, MD;  Location: [facility];  Service: Endoscopy;  Laterality: N/A;  with dilation  . FOOT SURGERY     bone spurs  . NASAL SINUS SURGERY    . PLANTAR FASCIA RELEASE Right 08/03/2016   Procedure: PLANTAR FASCIA RELEASE WITH BONE SPUR EXCISION;  Surgeon: Dorna Leitz, MD;  Location: [facility];  Service: Orthopedics;  Laterality: Right;  . TONSILLECTOMY      There were no vitals filed for this visit.  Subjective Assessment - 12/25/19 0933    Subjective  Doing better not crossing my legs. I can feel the knee pain come on with Rt SIJ pain, able to use correction. Getting in and out of car is still painful. I drive a manual.    Pertinent History  bil plantar fascia release 2017x3  with bone spur removal    Patient Stated Goals  stability through hips, no pain with sitting, stairs (lives on 3rd floor)    Currently in Pain?  Yes    Pain Score  8     Pain Location  Knee    Pain Orientation  Left    Pain Descriptors / Indicators  Sharp    Aggravating Factors   getting into/out of car    Pain Relieving Factors  coorection MET                       [facility] Adult PT Treatment/Exercise - 12/25/19 0001      Exercises   Exercises  Knee/Hip      Knee/Hip Exercises: Stretches   Passive Hamstring Stretch  Both;30 seconds    Piriformis Stretch Limitations  supine figure 4      Knee/Hip Exercises: Aerobic   Stationary Bike  5 min L4      Knee/Hip Exercises: Supine   Bridges Limitations  blue tband at knees    Other Supine Knee/Hip Exercises  post pelvic tilt      Knee/Hip Exercises: Sidelying   Hip ABduction  Both;Strengthening      Manual Therapy   Manual Therapy  Soft  tissue mobilization    Manual therapy comments  skilled palpation and monitoring during TPDN    Soft tissue mobilization  Lt glut max/med/min, TFL    Muscle Energy Technique  Lt hip flexors/Rt extensors       Trigger Point Dry Needling - 12/25/19 0001    Consent Given?  Yes    Education Handout Provided  --   verbal education   Muscles Treated Back/Hip  Gluteus minimus;Gluteus medius;Tensor fascia lata    Gluteus Minimus Response  Twitch response elicited;Palpable increased muscle length   L   Gluteus Medius Response  Twitch response elicited;Palpable increased muscle length   L   Tensor Fascia Lata Response  Twitch response elicited;Palpable increased muscle length   L          PT Education - 12/25/19 1015    Education Details  TPDN & expected outcomes    Person(s) Educated  Patient    Methods  Explanation    Comprehension  Verbalized understanding;Need further instruction       PT Short Term Goals - 12/14/19 2054      PT SHORT TERM GOAL #1   Title  pt will be  independent in self correction utilizing MET    Baseline  began educating at eval    Time  4    Period  Weeks    Status  New    Target Date  01/15/20      PT SHORT TERM GOAL #2   Title  pt will report ability to correct posture and maintain equal pressure    Baseline  began discussing at eval    Time  4    Period  Weeks    Status  New    Target Date  01/15/20        PT Long Term Goals - 12/14/19 2056      PT LONG TERM GOAL #1   Title  gross LE strength to 5/5    Baseline  not appropriate to test at eval due to notable pelvic rotation    Time  8    Period  Weeks    Status  New    Target Date  02/12/20      PT LONG TERM GOAL #2   Title  able to climb stairs to her apartment without knee pain    Baseline  pain at eval    Time  8    Period  Weeks    Status  New    Target Date  02/12/20      PT LONG TERM GOAL #3   Title  pt will be able to maintain a seated position for at least 20 min comfortably    Baseline  unable comfortably at eval    Time  8    Period  Weeks    Status  New    Target Date  02/12/20      PT LONG TERM GOAL #4   Title  pt will be able to complete household chores without increase in knee pain    Baseline  significant pain at eval    Time  8    Period  Weeks    Status  New    Target Date  02/12/20            Plan - 12/25/19 1010    Clinical Impression Statement  Pt reported resolved pain following treatment today and soreness at DN sites as expected. Advanced HEP with retraining exercises and  encouraged her to continue focusing on posture.    PT Treatment/Interventions  ADLs/Self Care Home Management;Cryotherapy;Electrical Stimulation;Iontophoresis 59m/ml Dexamethasone;Stair training;Functional mobility training;Gait training;Ultrasound;Traction;Moist Heat;Therapeutic activities;Therapeutic exercise;Balance training;Neuromuscular re-education;Patient/family education;Passive range of motion;Manual techniques;Dry needling;Taping;Spinal  Manipulations;Joint Manipulations    PT Next Visit Plan  check rotation, progress lumbopelvic stability PRN, outcome of DN?    PT Home Exercise Plan  MET PRN, posture/equal pressure stance, [id number]    Consulted and Agree with Plan of Care  Patient       Patient will benefit from skilled therapeutic intervention in order to improve the following deficits and impairments:  Difficulty walking, Increased muscle spasms, Decreased activity tolerance, Pain, Improper body mechanics, Decreased balance, Decreased strength, Postural dysfunction  Visit Diagnosis: Chronic pain of left knee  Muscle weakness (generalized)     Problem List Patient Active Problem List   Diagnosis Date Noted  . Chest pain of uncertain etiology 145/80/9983 . Heart palpitations 09/05/2019  . Shortness of breath 09/05/2019  . Anxiety 09/05/2019  . Tachycardia 07/23/2019  . Sprain of right ankle 02/03/2019  . MDD (major depressive disorder), recurrent episode, moderate ([place] 06/28/2017  . OCD (obsessive compulsive disorder) 03/26/2017  . Obstructive sleep apnea of adult 01/18/2017  . Insomnia 02/08/2016  . Heel spur 09/01/2015  . Family history of cystitis 08/31/2015  . Left foot pain 08/31/2015  . Prediabetes 08/23/2015  . Hyperlipidemia 08/23/2015  . Depression 03/21/2015  . Acute esophagitis 02/09/2015  . Gastroesophageal reflux disease without esophagitis 08/27/2014  . Dysphagia, unspecified(787.20) 03/22/2014   Gianny Killman C. Laquisha Northcraft PT, DPT 12/25/19 10:16 AM   [facility]G[place]iefl[place] [address]Phone: [phone]  Fax:  [phone] Name: AAdri SchlossMRN: [id number] of Birth: 110-Sep-1967

## 2019-12-28 ENCOUNTER — Encounter: Payer: Self-pay | Admitting: Physical Therapy

## 2019-12-28 ENCOUNTER — Ambulatory Visit: Payer: Medicare Other | Admitting: Physical Therapy

## 2019-12-28 ENCOUNTER — Other Ambulatory Visit: Payer: Self-pay

## 2019-12-28 DIAGNOSIS — G8929 Other chronic pain: Secondary | ICD-10-CM

## 2019-12-28 DIAGNOSIS — M25562 Pain in left knee: Secondary | ICD-10-CM | POA: Diagnosis not present

## 2019-12-28 DIAGNOSIS — M6281 Muscle weakness (generalized): Secondary | ICD-10-CM

## 2019-12-28 NOTE — Therapy (Signed)
[place], [place], [address] Phone: [phone]   Fax:  [phone]  Physical Therapy Treatment  Patient Details  Name: Stephanie Serrano MRN: [id number] Date of Birth: 11/20/1966 Referring Provider (PT): Lowella Petties, MD   Encounter Date: 12/28/2019  PT End of Session - 12/28/19 1504    Visit Number  3    Number of Visits  17    Date for PT Re-Evaluation  02/12/20    Authorization Type  MCR/MCD    PT Start Time  1503    PT Stop Time  1549    PT Time Calculation (min)  46 min    Activity Tolerance  Patient tolerated treatment well    Behavior During Therapy  [facility] for tasks assessed/performed       Past Medical History:  Diagnosis Date  . Allergy   . Anemia   . Anxiety   . Arthritis   . Asthma   . Blood transfusion without reported diagnosis   . Depression   . Eczema   . Esophageal stricture   . GERD (gastroesophageal reflux disease)   . Migraines   . OCD (obsessive compulsive disorder)   . RLS (restless legs syndrome)   . Vertigo     Past Surgical History:  Procedure Laterality Date  . ESOPHAGOGASTRODUODENOSCOPY N/A 02/09/2015   Procedure: ESOPHAGOGASTRODUODENOSCOPY (EGD);  Surgeon: Inda Castle, MD;  Location: [facility];  Service: Endoscopy;  Laterality: N/A;  with dilation  . FOOT SURGERY     bone spurs  . NASAL SINUS SURGERY    . PLANTAR FASCIA RELEASE Right 08/03/2016   Procedure: PLANTAR FASCIA RELEASE WITH BONE SPUR EXCISION;  Surgeon: Dorna Leitz, MD;  Location: [place];  Service: Orthopedics;  Laterality: Right;  . TONSILLECTOMY      There were no vitals filed for this visit.  Subjective Assessment - 12/28/19 1505    Subjective  "Things have been doing much better"    Patient Stated Goals  stability through hips, no pain with sitting, stairs (lives on 3rd floor)    Currently in Pain?  Yes    Pain Score  6     Pain Orientation  Left                        [facility] Adult PT Treatment/Exercise - 12/28/19 0001      Knee/Hip Exercises: Stretches   Active Hamstring Stretch  2 reps;30 seconds;Right   PNF contract relax   Passive Hamstring Stretch  30 seconds;2 reps    Piriformis Stretch Limitations  supine figure 4      Knee/Hip Exercises: Supine   Quad Sets  Strengthening;Both;2 sets;10 reps   with ball squeeze   Straight Leg Raise with External Rotation  Right;Strengthening;2 sets;15 reps      Manual Therapy   Manual Therapy  Joint mobilization    Manual therapy comments  MTPR along the R lumbar paraspinals, and L vastus lateralis    Joint Mobilization  low load long duration lateral patellar mobs, LAD of the RLE grade V    Muscle Energy Technique  R hip flexors 1 x 10  holding 5 sec             PT Education - 12/28/19 1556    Education Details  muscle anatomy and effects on the SIJ    Person(s) Educated  Patient    Methods  Explanation;Verbal cues;Handout  Comprehension  Verbalized understanding;Verbal cues required       PT Short Term Goals - 12/14/19 2054      PT SHORT TERM GOAL #1   Title  pt will be independent in self correction utilizing MET    Baseline  began educating at eval    Time  4    Period  Weeks    Status  New    Target Date  01/15/20      PT SHORT TERM GOAL #2   Title  pt will report ability to correct posture and maintain equal pressure    Baseline  began discussing at eval    Time  4    Period  Weeks    Status  New    Target Date  01/15/20        PT Long Term Goals - 12/14/19 2056      PT LONG TERM GOAL #1   Title  gross LE strength to 5/5    Baseline  not appropriate to test at eval due to notable pelvic rotation    Time  8    Period  Weeks    Status  New    Target Date  02/12/20      PT LONG TERM GOAL #2   Title  able to climb stairs to her apartment without knee pain    Baseline  pain at eval    Time  8    Period  Weeks    Status  New     Target Date  02/12/20      PT LONG TERM GOAL #3   Title  pt will be able to maintain a seated position for at least 20 min comfortably    Baseline  unable comfortably at eval    Time  8    Period  Weeks    Status  New    Target Date  02/12/20      PT LONG TERM GOAL #4   Title  pt will be able to complete household chores without increase in knee pain    Baseline  significant pain at eval    Time  8    Period  Weeks    Status  New    Target Date  02/12/20            Plan - 12/28/19 1557    Clinical Impression Statement  pt noted significant relief of pain in the R low back and L knee. She continues to report pain rated at a 3/10 in both R SIJ and L knee. Continued RLE LAD and L knee LLLD patellar mobs. Continued MET techniques for the R SIJ. Educated on MTPR and how to perform at home, end of session she noted feeling looser and noted decreased pain.    Examination-Activity Limitations  Bed Mobility;Bend;Sit;Sleep;Squat;Stairs;Stand;Transfers;Locomotion Level    PT Treatment/Interventions  ADLs/Self Care Home Management;Cryotherapy;Electrical Stimulation;Iontophoresis 11m/ml Dexamethasone;Stair training;Functional mobility training;Gait training;Ultrasound;Traction;Moist Heat;Therapeutic activities;Therapeutic exercise;Balance training;Neuromuscular re-education;Patient/family education;Passive range of motion;Manual techniques;Dry needling;Taping;Spinal Manipulations;Joint Manipulations    PT Next Visit Plan  check rotation, progress lumbopelvic stability PRN, DN PRN    PT Home Exercise Plan  MET PRN, posture/equal pressure stance, [id number], quad set with ball squeeze, seated hamstring    Consulted and Agree with Plan of Care  Patient       Patient will benefit from skilled therapeutic intervention in order to improve the following deficits and impairments:  Difficulty walking, Increased muscle spasms, Decreased activity tolerance, Pain, Improper  body mechanics, Decreased balance,  Decreased strength, Postural dysfunction  Visit Diagnosis: Chronic pain of left knee  Muscle weakness (generalized)     Problem List Patient Active Problem List   Diagnosis Date Noted  . Chest pain of uncertain etiology 38/68/5488  . Heart palpitations 09/05/2019  . Shortness of breath 09/05/2019  . Anxiety 09/05/2019  . Tachycardia 07/23/2019  . Sprain of right ankle 02/03/2019  . MDD (major depressive disorder), recurrent episode, moderate ([place]) 06/28/2017  . OCD (obsessive compulsive disorder) 03/26/2017  . Obstructive sleep apnea of adult 01/18/2017  . Insomnia 02/08/2016  . Heel spur 09/01/2015  . Family history of cystitis 08/31/2015  . Left foot pain 08/31/2015  . Prediabetes 08/23/2015  . Hyperlipidemia 08/23/2015  . Depression 03/21/2015  . Acute esophagitis 02/09/2015  . Gastroesophageal reflux disease without esophagitis 08/27/2014  . Dysphagia, unspecified(787.20) 03/22/2014   Starr Lake PT, DPT, LAT, ATC  12/28/19  4:04 PM      [facility] [address] [place], [place], [address] Phone: [phone]   Fax:  [phone]  Name: Sean Macwilliams MRN: [id number] Date of Birth: May 20, 1966

## 2019-12-30 ENCOUNTER — Other Ambulatory Visit: Payer: Self-pay

## 2019-12-30 ENCOUNTER — Encounter: Payer: Self-pay | Admitting: Physical Therapy

## 2019-12-30 ENCOUNTER — Ambulatory Visit: Payer: Medicare Other | Admitting: Physical Therapy

## 2019-12-30 DIAGNOSIS — M6281 Muscle weakness (generalized): Secondary | ICD-10-CM

## 2019-12-30 DIAGNOSIS — M25562 Pain in left knee: Secondary | ICD-10-CM

## 2019-12-30 DIAGNOSIS — G8929 Other chronic pain: Secondary | ICD-10-CM

## 2019-12-30 NOTE — Therapy (Signed)
[place], [place], [address] Phone: [phone]   Fax:  [phone]  Physical Therapy Treatment  Patient Details  Name: Stephanie Serrano MRN: [id number] Date of Birth: 12-18-1965 Referring Provider (PT): Lowella Petties, MD   Encounter Date: 12/30/2019  PT End of Session - 12/30/19 1635    Visit Number  4    Number of Visits  17    Date for PT Re-Evaluation  02/12/20    Authorization Type  MCR/MCD    PT Start Time  1630    PT Stop Time  1712    PT Time Calculation (min)  42 min    Activity Tolerance  Patient tolerated treatment well    Behavior During Therapy  [facility] for tasks assessed/performed       Past Medical History:  Diagnosis Date  . Allergy   . Anemia   . Anxiety   . Arthritis   . Asthma   . Blood transfusion without reported diagnosis   . Depression   . Eczema   . Esophageal stricture   . GERD (gastroesophageal reflux disease)   . Migraines   . OCD (obsessive compulsive disorder)   . RLS (restless legs syndrome)   . Vertigo     Past Surgical History:  Procedure Laterality Date  . ESOPHAGOGASTRODUODENOSCOPY N/A 02/09/2015   Procedure: ESOPHAGOGASTRODUODENOSCOPY (EGD);  Surgeon: Inda Castle, MD;  Location: [facility];  Service: Endoscopy;  Laterality: N/A;  with dilation  . FOOT SURGERY     bone spurs  . NASAL SINUS SURGERY    . PLANTAR FASCIA RELEASE Right 08/03/2016   Procedure: PLANTAR FASCIA RELEASE WITH BONE SPUR EXCISION;  Surgeon: Dorna Leitz, MD;  Location: [place];  Service: Orthopedics;  Laterality: Right;  . TONSILLECTOMY      There were no vitals filed for this visit.  Subjective Assessment - 12/30/19 1635    Subjective  I was in pain a little yesterday and today. Knee is doing well.    Patient Stated Goals  stability through hips, no pain with sitting, stairs (lives on 3rd floor)    Currently in Pain?  Yes    Pain Score  6     Pain Location   Hip    Pain Orientation  Right    Pain Descriptors / Indicators  Spasm         [facility] PT Assessment - 12/30/19 0001      Strength   Overall Strength Comments  continued pelvic rotation noted                   [facility] Adult PT Treatment/Exercise - 12/30/19 0001      Knee/Hip Exercises: Aerobic   Stationary Bike  5 min L4 after DN      Knee/Hip Exercises: Supine   Other Supine Knee/Hip Exercises  post pelvic tilt with ball squeeze      Manual Therapy   Manual therapy comments  skilled palpation and monitoring during TPDN    Soft tissue mobilization  Rt hip musculature STM    Muscle Energy Technique  Lt flexors/Rt extensors       Trigger Point Dry Needling - 12/30/19 0001    Muscles Treated Back/Hip  Piriformis    Dry Needling Comments  addendum from 1/15: Rt hip DN, all on Rt today    Gluteus Minimus Response  Twitch response elicited;Palpable increased muscle length    Gluteus Medius Response  Twitch response  elicited;Palpable increased muscle length    Piriformis Response  Twitch response elicited;Palpable increased muscle length    Tensor Fascia Lata Response  Twitch response elicited;Palpable increased muscle length           PT Education - 12/30/19 1713    Education Details  see plan    Person(s) Educated  Patient    Methods  Explanation    Comprehension  Verbalized understanding       PT Short Term Goals - 12/14/19 2054      PT SHORT TERM GOAL #1   Title  pt will be independent in self correction utilizing MET    Baseline  began educating at eval    Time  4    Period  Weeks    Status  New    Target Date  01/15/20      PT SHORT TERM GOAL #2   Title  pt will report ability to correct posture and maintain equal pressure    Baseline  began discussing at eval    Time  4    Period  Weeks    Status  New    Target Date  01/15/20        PT Long Term Goals - 12/14/19 2056      PT LONG TERM GOAL #1   Title  gross LE strength to 5/5     Baseline  not appropriate to test at eval due to notable pelvic rotation    Time  8    Period  Weeks    Status  New    Target Date  02/12/20      PT LONG TERM GOAL #2   Title  able to climb stairs to her apartment without knee pain    Baseline  pain at eval    Time  8    Period  Weeks    Status  New    Target Date  02/12/20      PT LONG TERM GOAL #3   Title  pt will be able to maintain a seated position for at least 20 min comfortably    Baseline  unable comfortably at eval    Time  8    Period  Weeks    Status  New    Target Date  02/12/20      PT LONG TERM GOAL #4   Title  pt will be able to complete household chores without increase in knee pain    Baseline  significant pain at eval    Time  8    Period  Weeks    Status  New    Target Date  02/12/20            Plan - 12/30/19 1706    Clinical Impression Statement  Pt presented with innominate rotation that was corrected with MET and reduced concordant pain. Had significant improvement in pain following DN last time it was utilized so we repeated it today. Discussed symptoms and how to diagnose need for correction or stretching and will cont to educate on this.    PT Treatment/Interventions  ADLs/Self Care Home Management;Cryotherapy;Electrical Stimulation;Iontophoresis 4mg/ml Dexamethasone;Stair training;Functional mobility training;Gait training;Ultrasound;Traction;Moist Heat;Therapeutic activities;Therapeutic exercise;Balance training;Neuromuscular re-education;Patient/family education;Passive range of motion;Manual techniques;Dry needling;Taping;Spinal Manipulations;Joint Manipulations    PT Next Visit Plan  check rotation, progress lumbopelvic stability PRN, DN PRN    PT Home Exercise Plan  MET PRN, posture/equal pressure stance, [id number], quad set with ball squeeze, seated hamstring    Consulted   and Agree with Plan of Care  Patient       Patient will benefit from skilled therapeutic intervention in order to  improve the following deficits and impairments:  Difficulty walking, Increased muscle spasms, Decreased activity tolerance, Pain, Improper body mechanics, Decreased balance, Decreased strength, Postural dysfunction  Visit Diagnosis: Chronic pain of left knee  Muscle weakness (generalized)     Problem List Patient Active Problem List   Diagnosis Date Noted  . Chest pain of uncertain etiology 10/14/2019  . Heart palpitations 09/05/2019  . Shortness of breath 09/05/2019  . Anxiety 09/05/2019  . Tachycardia 07/23/2019  . Sprain of right ankle 02/03/2019  . MDD (major depressive disorder), recurrent episode, moderate ([facility]) 06/28/2017  . OCD (obsessive compulsive disorder) 03/26/2017  . Obstructive sleep apnea of adult 01/18/2017  . Insomnia 02/08/2016  . Heel spur 09/01/2015  . Family history of cystitis 08/31/2015  . Left foot pain 08/31/2015  . Prediabetes 08/23/2015  . Hyperlipidemia 08/23/2015  . Depression 03/21/2015  . Acute esophagitis 02/09/2015  . Gastroesophageal reflux disease without esophagitis 08/27/2014  . Dysphagia, unspecified(787.20) 03/22/2014     C.  PT, DPT 12/30/19 5:13 PM    [facility] [address] [place], [place], [address] Phone: [phone]   Fax:  [phone]  Name: Taralynn Lesniewski MRN: [id number] Date of Birth: 07/19/1966   

## 2020-01-04 ENCOUNTER — Ambulatory Visit: Payer: Medicare Other | Admitting: Physical Therapy

## 2020-01-06 ENCOUNTER — Other Ambulatory Visit: Payer: Self-pay

## 2020-01-06 ENCOUNTER — Encounter: Payer: Self-pay | Admitting: Physical Therapy

## 2020-01-06 ENCOUNTER — Ambulatory Visit: Payer: Medicare Other | Admitting: Physical Therapy

## 2020-01-06 DIAGNOSIS — G8929 Other chronic pain: Secondary | ICD-10-CM

## 2020-01-06 DIAGNOSIS — M6281 Muscle weakness (generalized): Secondary | ICD-10-CM

## 2020-01-06 DIAGNOSIS — M25562 Pain in left knee: Secondary | ICD-10-CM | POA: Diagnosis not present

## 2020-01-06 NOTE — Therapy (Signed)
[place], [place], [address] Phone: [phone]   Fax:  [phone]  Physical Therapy Treatment  Patient Details  Name: Stephanie Serrano MRN: [id number] Date of Birth: [birth year] Referring Provider (PT): Lowella Petties, MD   Encounter Date: 01/06/2020  PT End of Session - 01/06/20 1417    Visit Number  5    Number of Visits  17    Date for PT Re-Evaluation  02/12/20    Authorization Type  MCR/MCD    PT Start Time  1416    PT Stop Time  1459    PT Time Calculation (min)  43 min    Activity Tolerance  Patient tolerated treatment well    Behavior During Therapy  [facility] for tasks assessed/performed       Past Medical History:  Diagnosis Date  . Allergy   . Anemia   . Anxiety   . Arthritis   . Asthma   . Blood transfusion without reported diagnosis   . Depression   . Eczema   . Esophageal stricture   . GERD (gastroesophageal reflux disease)   . Migraines   . OCD (obsessive compulsive disorder)   . RLS (restless legs syndrome)   . Vertigo     Past Surgical History:  Procedure Laterality Date  . ESOPHAGOGASTRODUODENOSCOPY N/A 02/09/2015   Procedure: ESOPHAGOGASTRODUODENOSCOPY (EGD);  Surgeon: Inda Castle, MD;  Location: [facility];  Service: Endoscopy;  Laterality: N/A;  with dilation  . FOOT SURGERY     bone spurs  . NASAL SINUS SURGERY    . PLANTAR FASCIA RELEASE Right 08/03/2016   Procedure: PLANTAR FASCIA RELEASE WITH BONE SPUR EXCISION;  Surgeon: Dorna Leitz, MD;  Location: [place];  Service: Orthopedics;  Laterality: Right;  . TONSILLECTOMY      There were no vitals filed for this visit.  Subjective Assessment - 01/06/20 1417    Subjective  "I've been trying to work on the exercises and have been trying to avoid crossing my legs"    Currently in Pain?  Yes    Pain Score  7     Pain Location  Hip    Pain Orientation  Right    Pain Descriptors / Indicators  Aching     Pain Type  Chronic pain    Pain Frequency  Intermittent                       [facility] Adult PT Treatment/Exercise - 01/06/20 0001      Self-Care   Self-Care  Other Self-Care Comments    Other Self-Care Comments   benefits of SI belt and proper application       Knee/Hip Exercises: Stretches   Other Knee/Hip Stretches  Lower trunk rotation 1 x 10       Knee/Hip Exercises: Aerobic   Nustep  L5 x 5 min UE/LE      Knee/Hip Exercises: Supine   Other Supine Knee/Hip Exercises  post pelvic tilt with ball squeeze    Other Supine Knee/Hip Exercises  dead bug 1 x 10 holding 10 secs      Manual Therapy   Manual therapy comments  MTPR along piriformis, hamstrings    Joint Mobilization  LAD RLE grade III- IV     Soft tissue mobilization  tack and stretch of the piriformis hamstring     Muscle Energy Technique  Lt flexors/Rt extensors  PT Education - 01/06/20 1554    Education Details  provided information regarding SI belts and where she can find one    Person(s) Educated  Patient    Methods  Explanation;Verbal cues    Comprehension  Verbalized understanding;Verbal cues required       PT Short Term Goals - 12/14/19 2054      PT SHORT TERM GOAL #1   Title  pt will be independent in self correction utilizing MET    Baseline  began educating at eval    Time  4    Period  Weeks    Status  New    Target Date  01/15/20      PT SHORT TERM GOAL #2   Title  pt will report ability to correct posture and maintain equal pressure    Baseline  began discussing at eval    Time  4    Period  Weeks    Status  New    Target Date  01/15/20        PT Long Term Goals - 12/14/19 2056      PT LONG TERM GOAL #1   Title  gross LE strength to 5/5    Baseline  not appropriate to test at eval due to notable pelvic rotation    Time  8    Period  Weeks    Status  New    Target Date  02/12/20      PT LONG TERM GOAL #2   Title  able to climb stairs to her  apartment without knee pain    Baseline  pain at eval    Time  8    Period  Weeks    Status  New    Target Date  02/12/20      PT LONG TERM GOAL #3   Title  pt will be able to maintain a seated position for at least 20 min comfortably    Baseline  unable comfortably at eval    Time  8    Period  Weeks    Status  New    Target Date  02/12/20      PT LONG TERM GOAL #4   Title  pt will be able to complete household chores without increase in knee pain    Baseline  significant pain at eval    Time  8    Period  Weeks    Status  New    Target Date  02/12/20            Plan - 01/06/20 1555    Clinical Impression Statement  pt reported missing last appointment because she missed up the time. continued working hamstring stretching and MET techniques which she continues to respond well to in the session. utilized Tennis ball for trigger point release and tack and stretch techniques. Trialed SI belt during session which provided significant relief per pt report and was able to do all exercises well.    PT Treatment/Interventions  ADLs/Self Care Home Management;Cryotherapy;Electrical Stimulation;Iontophoresis 47m/ml Dexamethasone;Stair training;Functional mobility training;Gait training;Ultrasound;Traction;Moist Heat;Therapeutic activities;Therapeutic exercise;Balance training;Neuromuscular re-education;Patient/family education;Passive range of motion;Manual techniques;Dry needling;Taping;Spinal Manipulations;Joint Manipulations    PT Next Visit Plan  check rotation, progress lumbopelvic stability PRN, DN PRN, did she get an SI belt    PT Home Exercise Plan  MET PRN, posture/equal pressure stance, [id number], quad set with ball squeeze, seated hamstring    Consulted and Agree with Plan of Care  Patient  Patient will benefit from skilled therapeutic intervention in order to improve the following deficits and impairments:  Difficulty walking, Increased muscle spasms, Decreased activity  tolerance, Pain, Improper body mechanics, Decreased balance, Decreased strength, Postural dysfunction  Visit Diagnosis: Chronic pain of left knee  Muscle weakness (generalized)     Problem List Patient Active Problem List   Diagnosis Date Noted  . Chest pain of uncertain etiology 14/60/4799  . Heart palpitations 09/05/2019  . Shortness of breath 09/05/2019  . Anxiety 09/05/2019  . Tachycardia 07/23/2019  . Sprain of right ankle 02/03/2019  . MDD (major depressive disorder), recurrent episode, moderate ([place]) 06/28/2017  . OCD (obsessive compulsive disorder) 03/26/2017  . Obstructive sleep apnea of adult 01/18/2017  . Insomnia 02/08/2016  . Heel spur 09/01/2015  . Family history of cystitis 08/31/2015  . Left foot pain 08/31/2015  . Prediabetes 08/23/2015  . Hyperlipidemia 08/23/2015  . Depression 03/21/2015  . Acute esophagitis 02/09/2015  . Gastroesophageal reflux disease without esophagitis 08/27/2014  . Dysphagia, unspecified(787.20) 03/22/2014   Starr Lake PT, DPT, LAT, ATC  01/06/20  4:03 PM      [facility] [address] [place], [place], [address] Phone: [phone]   Fax:  [phone]  Name: Stephanie Serrano MRN: [id number] Date of Birth: 04/26/1966

## 2020-01-12 ENCOUNTER — Ambulatory Visit: Payer: Medicare Other | Attending: Orthopedic Surgery | Admitting: Physical Therapy

## 2020-01-12 ENCOUNTER — Encounter: Payer: Self-pay | Admitting: Physical Therapy

## 2020-01-12 ENCOUNTER — Other Ambulatory Visit: Payer: Self-pay

## 2020-01-12 DIAGNOSIS — M6281 Muscle weakness (generalized): Secondary | ICD-10-CM | POA: Diagnosis present

## 2020-01-12 DIAGNOSIS — G8929 Other chronic pain: Secondary | ICD-10-CM | POA: Diagnosis present

## 2020-01-12 DIAGNOSIS — M25562 Pain in left knee: Secondary | ICD-10-CM | POA: Insufficient documentation

## 2020-01-12 NOTE — Therapy (Signed)
[place], [place], [address] Phone: [phone]   Fax:  [phone]  Physical Therapy Treatment  Patient Details  Name: Stephanie Serrano MRN: [id number] Date of Birth: [birth year] Referring Provider (PT): Lowella Petties, MD   Encounter Date: 01/12/2020  PT End of Session - 01/12/20 1421    Visit Number  6    Number of Visits  17    Date for PT Re-Evaluation  02/12/20    Authorization Type  MCR/MCD    PT Start Time  1416    PT Stop Time  1502    PT Time Calculation (min)  46 min    Activity Tolerance  Patient tolerated treatment well    Behavior During Therapy  [facility] for tasks assessed/performed       Past Medical History:  Diagnosis Date  . Allergy   . Anemia   . Anxiety   . Arthritis   . Asthma   . Blood transfusion without reported diagnosis   . Depression   . Eczema   . Esophageal stricture   . GERD (gastroesophageal reflux disease)   . Migraines   . OCD (obsessive compulsive disorder)   . RLS (restless legs syndrome)   . Vertigo     Past Surgical History:  Procedure Laterality Date  . ESOPHAGOGASTRODUODENOSCOPY N/A 02/09/2015   Procedure: ESOPHAGOGASTRODUODENOSCOPY (EGD);  Surgeon: Inda Castle, MD;  Location: [facility];  Service: Endoscopy;  Laterality: N/A;  with dilation  . FOOT SURGERY     bone spurs  . NASAL SINUS SURGERY    . PLANTAR FASCIA RELEASE Right 08/03/2016   Procedure: PLANTAR FASCIA RELEASE WITH BONE SPUR EXCISION;  Surgeon: Dorna Leitz, MD;  Location: [place];  Service: Orthopedics;  Laterality: Right;  . TONSILLECTOMY      There were no vitals filed for this visit.  Subjective Assessment - 01/12/20 1420    Subjective  Did a lot of bending yesterday while cleaning. I am hurting today    Patient Stated Goals  stability through hips, no pain with sitting, stairs (lives on 3rd floor)    Currently in Pain?  Yes    Pain Score  8     Pain Location   Knee    Pain Orientation  Left    Pain Descriptors / Indicators  --   sore   Pain Radiating Towards  rt hip                       [facility] Adult PT Treatment/Exercise - 01/12/20 0001      Knee/Hip Exercises: Stretches   Other Knee/Hip Stretches  roller- HS, piriformis, TFL      Manual Therapy   Joint Mobilization  bil SIJ    Soft tissue mobilization  all muscles in TPDN, Rt HS roll    Muscle Energy Technique  Lt flexors/Rt extensors       Trigger Point Dry Needling - 01/12/20 0001    Dry Needling Comments  all on Rt side    Gluteus Minimus Response  Twitch response elicited;Palpable increased muscle length    Gluteus Medius Response  Twitch response elicited;Palpable increased muscle length    Piriformis Response  Twitch response elicited;Palpable increased muscle length    Tensor Fascia Lata Response  Twitch response elicited;Palpable increased muscle length             PT Short Term Goals - 12/14/19 2054  PT SHORT TERM GOAL #1   Title  pt will be independent in self correction utilizing MET    Baseline  began educating at eval    Time  4    Period  Weeks    Status  New    Target Date  01/15/20      PT SHORT TERM GOAL #2   Title  pt will report ability to correct posture and maintain equal pressure    Baseline  began discussing at eval    Time  4    Period  Weeks    Status  New    Target Date  01/15/20        PT Long Term Goals - 12/14/19 2056      PT LONG TERM GOAL #1   Title  gross LE strength to 5/5    Baseline  not appropriate to test at eval due to notable pelvic rotation    Time  8    Period  Weeks    Status  New    Target Date  02/12/20      PT LONG TERM GOAL #2   Title  able to climb stairs to her apartment without knee pain    Baseline  pain at eval    Time  8    Period  Weeks    Status  New    Target Date  02/12/20      PT LONG TERM GOAL #3   Title  pt will be able to maintain a seated position for at least 20 min  comfortably    Baseline  unable comfortably at eval    Time  8    Period  Weeks    Status  New    Target Date  02/12/20      PT LONG TERM GOAL #4   Title  pt will be able to complete household chores without increase in knee pain    Baseline  significant pain at eval    Time  8    Period  Weeks    Status  New    Target Date  02/12/20            Plan - 01/12/20 1509    Clinical Impression Statement  Significant pelvic rotation noted today with high pain levels. It took longer to correct today than in the past indicating increased lumbopelvic strength but continues to spasm. Discussed decreasing time spent on a physical task to stretch and roll rather than doing too much when she feels good. Reported feeling much better following treatment.    PT Treatment/Interventions  ADLs/Self Care Home Management;Cryotherapy;Electrical Stimulation;Iontophoresis 58m/ml Dexamethasone;Stair training;Functional mobility training;Gait training;Ultrasound;Traction;Moist Heat;Therapeutic activities;Therapeutic exercise;Balance training;Neuromuscular re-education;Patient/family education;Passive range of motion;Manual techniques;Dry needling;Taping;Spinal Manipulations;Joint Manipulations    PT Next Visit Plan  check rotation, progress lumbopelvic stability PRN, DN PRN, did she get an SI belt, how did foam roll go?    PT Home Exercise Plan  MET PRN, posture/equal pressure stance, [id number], quad set with ball squeeze, seated hamstring    Consulted and Agree with Plan of Care  Patient       Patient will benefit from skilled therapeutic intervention in order to improve the following deficits and impairments:  Difficulty walking, Increased muscle spasms, Decreased activity tolerance, Pain, Improper body mechanics, Decreased balance, Decreased strength, Postural dysfunction  Visit Diagnosis: Chronic pain of left knee  Muscle weakness (generalized)     Problem List Patient Active Problem List    Diagnosis  Date Noted  . Chest pain of uncertain etiology 07/57/3225  . Heart palpitations 09/05/2019  . Shortness of breath 09/05/2019  . Anxiety 09/05/2019  . Tachycardia 07/23/2019  . Sprain of right ankle 02/03/2019  . MDD (major depressive disorder), recurrent episode, moderate ([place]) 06/28/2017  . OCD (obsessive compulsive disorder) 03/26/2017  . Obstructive sleep apnea of adult 01/18/2017  . Insomnia 02/08/2016  . Heel spur 09/01/2015  . Family history of cystitis 08/31/2015  . Left foot pain 08/31/2015  . Prediabetes 08/23/2015  . Hyperlipidemia 08/23/2015  . Depression 03/21/2015  . Acute esophagitis 02/09/2015  . Gastroesophageal reflux disease without esophagitis 08/27/2014  . Dysphagia, unspecified(787.20) 03/22/2014   Stephanie Schewe C. Michon Kaczmarek PT, DPT 01/12/20 3:12 PM   [place], [place], [address] Phone: [phone]   Fax:  [phone]  Name: Stephanie Serrano MRN: [id number] Date of Birth: 21-Oct-1966

## 2020-01-14 ENCOUNTER — Other Ambulatory Visit: Payer: Self-pay

## 2020-01-14 ENCOUNTER — Ambulatory Visit: Payer: Medicare Other | Admitting: Physical Therapy

## 2020-01-14 ENCOUNTER — Encounter: Payer: Self-pay | Admitting: Physical Therapy

## 2020-01-14 DIAGNOSIS — G8929 Other chronic pain: Secondary | ICD-10-CM

## 2020-01-14 DIAGNOSIS — M6281 Muscle weakness (generalized): Secondary | ICD-10-CM

## 2020-01-14 DIAGNOSIS — M25562 Pain in left knee: Secondary | ICD-10-CM | POA: Diagnosis not present

## 2020-01-14 NOTE — Therapy (Signed)
[place], [place], [address] Phone: [phone]   Fax:  [phone]  Physical Therapy Treatment  Patient Details  Name: Stephanie Serrano MRN: [id number] Date of Birth: 05-13-1966 Referring Provider (PT): Lowella Petties, MD   Encounter Date: 01/14/2020  PT End of Session - 01/14/20 1010    Visit Number  7    Number of Visits  17    Date for PT Re-Evaluation  02/12/20    Authorization Type  MCR/MCD    PT Start Time  3295   time to check in   PT Stop Time  1010    PT Time Calculation (min)  36 min    Activity Tolerance  Patient tolerated treatment well    Behavior During Therapy  [facility] for tasks assessed/performed       Past Medical History:  Diagnosis Date  . Allergy   . Anemia   . Anxiety   . Arthritis   . Asthma   . Blood transfusion without reported diagnosis   . Depression   . Eczema   . Esophageal stricture   . GERD (gastroesophageal reflux disease)   . Migraines   . OCD (obsessive compulsive disorder)   . RLS (restless legs syndrome)   . Vertigo     Past Surgical History:  Procedure Laterality Date  . ESOPHAGOGASTRODUODENOSCOPY N/A 02/09/2015   Procedure: ESOPHAGOGASTRODUODENOSCOPY (EGD);  Surgeon: Inda Castle, MD;  Location: [facility];  Service: Endoscopy;  Laterality: N/A;  with dilation  . FOOT SURGERY     bone spurs  . NASAL SINUS SURGERY    . PLANTAR FASCIA RELEASE Right 08/03/2016   Procedure: PLANTAR FASCIA RELEASE WITH BONE SPUR EXCISION;  Surgeon: Dorna Leitz, MD;  Location: [facility];  Service: Orthopedics;  Laterality: Right;  . TONSILLECTOMY      There were no vitals filed for this visit.  Subjective Assessment - 01/14/20 0936    Subjective  Most pain in Rt SIJ. Lt knee feels a lot better.    Currently in Pain?  Yes    Pain Score  6     Pain Location  Hip    Pain Orientation  Right;Posterior    Pain Descriptors / Indicators  Sore                        [facility] Adult PT Treatment/Exercise - 01/14/20 0001      Pilates   Pilates Reformer  footwork 2R1B, feet in straps 1R1B1Y, HSS, piriformis stretch      Knee/Hip Exercises: Aerobic   Nustep  5 min L7 UE & LE               PT Short Term Goals - 01/14/20 1884      PT SHORT TERM GOAL #1   Title  pt will be independent in self correction utilizing MET    Status  Achieved      PT SHORT TERM GOAL #2   Title  pt will report ability to correct posture and maintain equal pressure    Status  Achieved        PT Long Term Goals - 12/14/19 2056      PT LONG TERM GOAL #1   Title  gross LE strength to 5/5    Baseline  not appropriate to test at eval due to notable pelvic rotation    Time  8    Period  Weeks  Status  New    Target Date  02/12/20      PT LONG TERM GOAL #2   Title  able to climb stairs to her apartment without knee pain    Baseline  pain at eval    Time  8    Period  Weeks    Status  New    Target Date  02/12/20      PT LONG TERM GOAL #3   Title  pt will be able to maintain a seated position for at least 20 min comfortably    Baseline  unable comfortably at eval    Time  8    Period  Weeks    Status  New    Target Date  02/12/20      PT LONG TERM GOAL #4   Title  pt will be able to complete household chores without increase in knee pain    Baseline  significant pain at eval    Time  8    Period  Weeks    Status  New    Target Date  02/12/20            Plan - 01/14/20 1010    Clinical Impression Statement  utilized reformer today for increased stability challenges since she presented with level pelvis. Good tolerance with sensation that Lt hip was working harder. Had to pause during footwork to stretch as her back began cramping and was resolved.    PT Treatment/Interventions  ADLs/Self Care Home Management;Cryotherapy;Electrical Stimulation;Iontophoresis 15m/ml Dexamethasone;Stair training;Functional mobility  training;Gait training;Ultrasound;Traction;Moist Heat;Therapeutic activities;Therapeutic exercise;Balance training;Neuromuscular re-education;Patient/family education;Passive range of motion;Manual techniques;Dry needling;Taping;Spinal Manipulations;Joint Manipulations    PT Next Visit Plan  continue reformer    PT Home Exercise Plan  MET PRN, posture/equal pressure stance, [id number], quad set with ball squeeze, seated hamstring    Consulted and Agree with Plan of Care  Patient       Patient will benefit from skilled therapeutic intervention in order to improve the following deficits and impairments:  Difficulty walking, Increased muscle spasms, Decreased activity tolerance, Pain, Improper body mechanics, Decreased balance, Decreased strength, Postural dysfunction  Visit Diagnosis: Chronic pain of left knee  Muscle weakness (generalized)     Problem List Patient Active Problem List   Diagnosis Date Noted  . Chest pain of uncertain etiology 124/10/4642 . Heart palpitations 09/05/2019  . Shortness of breath 09/05/2019  . Anxiety 09/05/2019  . Tachycardia 07/23/2019  . Sprain of right ankle 02/03/2019  . MDD (major depressive disorder), recurrent episode, moderate [place] 06/28/2017  . OCD (obsessive compulsive disorder) 03/26/2017  . Obstructive sleep apnea of adult 01/18/2017  . Insomnia 02/08/2016  . Heel spur 09/01/2015  . Family history of cystitis 08/31/2015  . Left foot pain 08/31/2015  . Prediabetes 08/23/2015  . Hyperlipidemia 08/23/2015  . Depression 03/21/2015  . Acute esophagitis 02/09/2015  . Gastroesophageal reflux disease without esophagitis 08/27/2014  . Dysphagia, unspecified(787.20) 03/22/2014    Jaid Quirion C. Lizeth Bencosme PT, DPT 01/14/20 10:12 AM   [name]GMill[place] [address]Phone: [phone]  Fax:  [phone] Name: Stephanie GrieshopMRN: [id number] of Birth: 106-30-1967

## 2020-01-19 ENCOUNTER — Ambulatory Visit: Payer: Medicare Other | Admitting: Physical Therapy

## 2020-01-19 ENCOUNTER — Other Ambulatory Visit: Payer: Self-pay

## 2020-01-19 DIAGNOSIS — M25562 Pain in left knee: Secondary | ICD-10-CM | POA: Diagnosis not present

## 2020-01-19 DIAGNOSIS — G8929 Other chronic pain: Secondary | ICD-10-CM

## 2020-01-19 DIAGNOSIS — M6281 Muscle weakness (generalized): Secondary | ICD-10-CM

## 2020-01-19 NOTE — Therapy (Signed)
[place], [place], [address] Phone: [phone]   Fax:  [phone]  Physical Therapy Treatment  Patient Details  Name: Stephanie Serrano MRN: [id number] Date of Birth: Jun 30, 1966 Referring Provider (PT): Lowella Petties, MD   Encounter Date: 01/19/2020  PT End of Session - 01/19/20 1417    Visit Number  8    Number of Visits  17    Authorization Type  MCR/MCD    PT Start Time  7048    PT Stop Time  1457    PT Time Calculation (min)  42 min    Activity Tolerance  Patient tolerated treatment well    Behavior During Therapy  [facility] for tasks assessed/performed       Past Medical History:  Diagnosis Date  . Allergy   . Anemia   . Anxiety   . Arthritis   . Asthma   . Blood transfusion without reported diagnosis   . Depression   . Eczema   . Esophageal stricture   . GERD (gastroesophageal reflux disease)   . Migraines   . OCD (obsessive compulsive disorder)   . RLS (restless legs syndrome)   . Vertigo     Past Surgical History:  Procedure Laterality Date  . ESOPHAGOGASTRODUODENOSCOPY N/A 02/09/2015   Procedure: ESOPHAGOGASTRODUODENOSCOPY (EGD);  Surgeon: Inda Castle, MD;  Location: [facility];  Service: Endoscopy;  Laterality: N/A;  with dilation  . FOOT SURGERY     bone spurs  . NASAL SINUS SURGERY    . PLANTAR FASCIA RELEASE Right 08/03/2016   Procedure: PLANTAR FASCIA RELEASE WITH BONE SPUR EXCISION;  Surgeon: Dorna Leitz, MD;  Location: [place];  Service: Orthopedics;  Laterality: Right;  . TONSILLECTOMY      There were no vitals filed for this visit.  Subjective Assessment - 01/19/20 1417    Subjective  Felt strong after reformer. It seems pain in hip has gone lower. Knee is more painful, I think I am using it more because I feel stronger. I was able to carry a case of water upstairs.    Patient Stated Goals  stability through hips, no pain with sitting, stairs (lives  on 3rd floor)    Currently in Pain?  Yes    Pain Score  6     Pain Location  Knee    Pain Orientation  Left    Pain Descriptors / Indicators  Sore    Aggravating Factors   getting out of car         [facility] PT Assessment - 01/19/20 0001      Palpation   Palpation comment  R 81 cm, L 81.5                   [facility] Adult PT Treatment/Exercise - 01/19/20 0001      Pilates   Pilates Reformer  feet in straps 2R1B, sidelying 1R1B      Manual Therapy   Manual therapy comments  skilled palpation and monitoring during TPDN    Joint Mobilization  Lt FA AP gr 4    Soft tissue mobilization  Rt glut max & piriformis       Trigger Point Dry Needling - 01/19/20 0001    Muscles Treated Back/Hip  Gluteus maximus    Gluteus Maximus Response  Twitch response elicited;Palpable increased muscle length   Right   Piriformis Response  Twitch response elicited;Palpable increased muscle length   Right  PT Education - 01/19/20 1458    Education Details  leg length & heel lift, contribution of leg length to pain    Person(s) Educated  Patient    Methods  Explanation    Comprehension  Verbalized understanding;Need further instruction       PT Short Term Goals - 01/14/20 7253      PT SHORT TERM GOAL #1   Title  pt will be independent in self correction utilizing MET    Status  Achieved      PT SHORT TERM GOAL #2   Title  pt will report ability to correct posture and maintain equal pressure    Status  Achieved        PT Long Term Goals - 12/14/19 2056      PT LONG TERM GOAL #1   Title  gross LE strength to 5/5    Baseline  not appropriate to test at eval due to notable pelvic rotation    Time  8    Period  Weeks    Status  New    Target Date  02/12/20      PT LONG TERM GOAL #2   Title  able to climb stairs to her apartment without knee pain    Baseline  pain at eval    Time  8    Period  Weeks    Status  New    Target Date  02/12/20      PT LONG TERM  GOAL #3   Title  pt will be able to maintain a seated position for at least 20 min comfortably    Baseline  unable comfortably at eval    Time  8    Period  Weeks    Status  New    Target Date  02/12/20      PT LONG TERM GOAL #4   Title  pt will be able to complete household chores without increase in knee pain    Baseline  significant pain at eval    Time  8    Period  Weeks    Status  New    Target Date  02/12/20            Plan - 01/19/20 1430    Clinical Impression Statement  Single layer heel lift in Rt shoe. DN to Rt hip decreased concordant soreness. Reformer for stability well tolerated with more difficulty on Lt in sidelying than Rt.    PT Treatment/Interventions  ADLs/Self Care Home Management;Cryotherapy;Electrical Stimulation;Iontophoresis 62m/ml Dexamethasone;Stair training;Functional mobility training;Gait training;Ultrasound;Traction;Moist Heat;Therapeutic activities;Therapeutic exercise;Balance training;Neuromuscular re-education;Patient/family education;Passive range of motion;Manual techniques;Dry needling;Taping;Spinal Manipulations;Joint Manipulations    PT Next Visit Plan  outcome of wearing heel lift?    PT Home Exercise Plan  MET PRN, posture/equal pressure stance, [id number], quad set with ball squeeze, seated hamstring    Consulted and Agree with Plan of Care  Patient       Patient will benefit from skilled therapeutic intervention in order to improve the following deficits and impairments:  Difficulty walking, Increased muscle spasms, Decreased activity tolerance, Pain, Improper body mechanics, Decreased balance, Decreased strength, Postural dysfunction  Visit Diagnosis: Chronic pain of left knee  Muscle weakness (generalized)     Problem List Patient Active Problem List   Diagnosis Date Noted  . Chest pain of uncertain etiology 166/44/0347 . Heart palpitations 09/05/2019  . Shortness of breath 09/05/2019  . Anxiety 09/05/2019  . Tachycardia  07/23/2019  . Sprain of right  ankle 02/03/2019  . MDD (major depressive disorder), recurrent episode, moderate ([place]) 06/28/2017  . OCD (obsessive compulsive disorder) 03/26/2017  . Obstructive sleep apnea of adult 01/18/2017  . Insomnia 02/08/2016  . Heel spur 09/01/2015  . Family history of cystitis 08/31/2015  . Left foot pain 08/31/2015  . Prediabetes 08/23/2015  . Hyperlipidemia 08/23/2015  . Depression 03/21/2015  . Acute esophagitis 02/09/2015  . Gastroesophageal reflux disease without esophagitis 08/27/2014  . Dysphagia, unspecified(787.20) 03/22/2014    Grayer Sproles C. Shakevia Sarris PT, DPT 01/19/20 2:58 PM   [facility] [address] [place], [place], [address] Phone: [phone]   Fax:  [phone]  Name: Rajanee Schuelke MRN: [id number] Date of Birth: 1966-07-21

## 2020-01-21 ENCOUNTER — Encounter: Payer: Self-pay | Admitting: Physical Therapy

## 2020-01-21 ENCOUNTER — Ambulatory Visit: Payer: Medicare Other | Admitting: Physical Therapy

## 2020-01-21 ENCOUNTER — Other Ambulatory Visit: Payer: Self-pay

## 2020-01-21 DIAGNOSIS — G8929 Other chronic pain: Secondary | ICD-10-CM

## 2020-01-21 DIAGNOSIS — M25562 Pain in left knee: Secondary | ICD-10-CM | POA: Diagnosis not present

## 2020-01-21 DIAGNOSIS — M6281 Muscle weakness (generalized): Secondary | ICD-10-CM

## 2020-01-21 NOTE — Therapy (Signed)
[place], [place], [address] Phone: [phone]   Fax:  [phone]  Physical Therapy Treatment  Patient Details  Name: Stephanie Serrano MRN: [id number] Date of Birth: February 18, 1966 Referring Provider (PT): Lowella Petties, MD   Encounter Date: 01/21/2020  PT End of Session - 01/21/20 0850    Visit Number  9    Number of Visits  17    Date for PT Re-Evaluation  02/12/20    Authorization Type  MCR/MCD    PT Start Time  0847    PT Stop Time  0925    PT Time Calculation (min)  38 min    Activity Tolerance  Patient tolerated treatment well    Behavior During Therapy  [facility] for tasks assessed/performed       Past Medical History:  Diagnosis Date  . Allergy   . Anemia   . Anxiety   . Arthritis   . Asthma   . Blood transfusion without reported diagnosis   . Depression   . Eczema   . Esophageal stricture   . GERD (gastroesophageal reflux disease)   . Migraines   . OCD (obsessive compulsive disorder)   . RLS (restless legs syndrome)   . Vertigo     Past Surgical History:  Procedure Laterality Date  . ESOPHAGOGASTRODUODENOSCOPY N/A 02/09/2015   Procedure: ESOPHAGOGASTRODUODENOSCOPY (EGD);  Surgeon: Inda Castle, MD;  Location: [facility];  Service: Endoscopy;  Laterality: N/A;  with dilation  . FOOT SURGERY     bone spurs  . NASAL SINUS SURGERY    . PLANTAR FASCIA RELEASE Right 08/03/2016   Procedure: PLANTAR FASCIA RELEASE WITH BONE SPUR EXCISION;  Surgeon: Dorna Leitz, MD;  Location: [place];  Service: Orthopedics;  Laterality: Right;  . TONSILLECTOMY      There were no vitals filed for this visit.  Subjective Assessment - 01/21/20 0849    Subjective  I feel a tightness but not a pulling tightness- Right buttock just lateral to SIJ. Heel lift seems to take pressure off.    Patient Stated Goals  stability through hips, no pain with sitting, stairs (lives on 3rd floor)    Currently in Pain?  No/denies         [facility] PT Assessment - 01/21/20 0001      Assessment   Medical Diagnosis  Lt knee pain    Referring Provider (PT)  Lowella Petties, MD                   [facility] Adult PT Treatment/Exercise - 01/21/20 0001      Knee/Hip Exercises: Stretches   Passive Hamstring Stretch Limitations  supine with strap- midline & lateral    Piriformis Stretch Limitations  supine figure 4    Other Knee/Hip Stretches  foam roller      Knee/Hip Exercises: Aerobic   Elliptical  5 min L1 ramp 6      Knee/Hip Exercises: Seated   Other Seated Knee/Hip Exercises  hinge fwd seat hovers    Sit to [employer]  10 reps   hover-stand-hover-sit     Knee/Hip Exercises: Supine   Other Supine Knee/Hip Exercises  foam roll: NBOS balance, clam blue tband, marching, bridge               PT Short Term Goals - 01/14/20 4128      PT SHORT TERM GOAL #1   Title  pt will be independent in self correction  utilizing MET    Status  Achieved      PT SHORT TERM GOAL #2   Title  pt will report ability to correct posture and maintain equal pressure    Status  Achieved        PT Long Term Goals - 12/14/19 2056      PT LONG TERM GOAL #1   Title  gross LE strength to 5/5    Baseline  not appropriate to test at eval due to notable pelvic rotation    Time  8    Period  Weeks    Status  New    Target Date  02/12/20      PT LONG TERM GOAL #2   Title  able to climb stairs to her apartment without knee pain    Baseline  pain at eval    Time  8    Period  Weeks    Status  New    Target Date  02/12/20      PT LONG TERM GOAL #3   Title  pt will be able to maintain a seated position for at least 20 min comfortably    Baseline  unable comfortably at eval    Time  8    Period  Weeks    Status  New    Target Date  02/12/20      PT LONG TERM GOAL #4   Title  pt will be able to complete household chores without increase in knee pain    Baseline  significant pain at  eval    Time  8    Period  Weeks    Status  New    Target Date  02/12/20            Plan - 01/21/20 0926    Clinical Impression Statement  Able to reduce tightness with foam roll and exercises today. Doing well with heel lift and will continue to wear it. Has tendency to shift weight laterally in Right foot but able to correct with some cuing.    PT Treatment/Interventions  ADLs/Self Care Home Management;Cryotherapy;Electrical Stimulation;Iontophoresis '4mg'$ /ml Dexamethasone;Stair training;Functional mobility training;Gait training;Ultrasound;Traction;Moist Heat;Therapeutic activities;Therapeutic exercise;Balance training;Neuromuscular re-education;Patient/family education;Passive range of motion;Manual techniques;Dry needling;Taping;Spinal Manipulations;Joint Manipulations    PT Next Visit Plan  foam roll at home?, CKC stability    PT Home Exercise Plan  MET PRN, posture/equal pressure stance, [id number], quad set with ball squeeze, seated hamstring    Consulted and Agree with Plan of Care  Patient       Patient will benefit from skilled therapeutic intervention in order to improve the following deficits and impairments:  Difficulty walking, Increased muscle spasms, Decreased activity tolerance, Pain, Improper body mechanics, Decreased balance, Decreased strength, Postural dysfunction  Visit Diagnosis: Chronic pain of left knee  Muscle weakness (generalized)     Problem List Patient Active Problem List   Diagnosis Date Noted  . Chest pain of uncertain etiology 56/38/7564  . Heart palpitations 09/05/2019  . Shortness of breath 09/05/2019  . Anxiety 09/05/2019  . Tachycardia 07/23/2019  . Sprain of right ankle 02/03/2019  . MDD (major depressive disorder), recurrent episode, moderate ([place]) 06/28/2017  . OCD (obsessive compulsive disorder) 03/26/2017  . Obstructive sleep apnea of adult 01/18/2017  . Insomnia 02/08/2016  . Heel spur 09/01/2015  . Family history of cystitis  08/31/2015  . Left foot pain 08/31/2015  . Prediabetes 08/23/2015  . Hyperlipidemia 08/23/2015  . Depression 03/21/2015  . Acute esophagitis 02/09/2015  .  Gastroesophageal reflux disease without esophagitis 08/27/2014  . Dysphagia, unspecified(787.20) 03/22/2014    Tramell Piechota C. Carmela Piechowski PT, DPT 01/21/20 9:27 AM   [facility] [place] [address] [place], [place], [address] Phone: [phone]   Fax:  [phone]  Name: Stephanie Serrano MRN: [id number] Date of Birth: [birth year]

## 2020-01-26 ENCOUNTER — Ambulatory Visit: Payer: Medicare Other | Admitting: Physical Therapy

## 2020-01-26 ENCOUNTER — Other Ambulatory Visit: Payer: Self-pay

## 2020-01-26 ENCOUNTER — Encounter: Payer: Self-pay | Admitting: Physical Therapy

## 2020-01-26 DIAGNOSIS — G8929 Other chronic pain: Secondary | ICD-10-CM

## 2020-01-26 DIAGNOSIS — M25562 Pain in left knee: Secondary | ICD-10-CM | POA: Diagnosis not present

## 2020-01-26 DIAGNOSIS — M6281 Muscle weakness (generalized): Secondary | ICD-10-CM

## 2020-01-26 NOTE — Therapy (Addendum)
[place], [place], [address] Phone: [phone]   Fax:  [phone]  Physical Therapy Treatment Progress Note Reporting Period 12/14/2019 to 01/26/2020   See note below for Objective Data and Assessment of Progress/Goals.       Patient Details  Name: Stephanie Serrano MRN: [id number] Date of Birth: November 11, 1966 Referring Provider (PT): Lowella Petties, MD   Encounter Date: 01/26/2020  PT End of Session - 01/26/20 1457    Visit Number  10    Number of Visits  17    Date for PT Re-Evaluation  02/12/20    Authorization Type  MCR/MCD    PT Start Time  1415    PT Stop Time  1455    PT Time Calculation (min)  40 min    Activity Tolerance  Patient tolerated treatment well    Behavior During Therapy  [facility] for tasks assessed/performed       Past Medical History:  Diagnosis Date  . Allergy   . Anemia   . Anxiety   . Arthritis   . Asthma   . Blood transfusion without reported diagnosis   . Depression   . Eczema   . Esophageal stricture   . GERD (gastroesophageal reflux disease)   . Migraines   . OCD (obsessive compulsive disorder)   . RLS (restless legs syndrome)   . Vertigo     Past Surgical History:  Procedure Laterality Date  . ESOPHAGOGASTRODUODENOSCOPY N/A 02/09/2015   Procedure: ESOPHAGOGASTRODUODENOSCOPY (EGD);  Surgeon: Inda Castle, MD;  Location: [facility];  Service: Endoscopy;  Laterality: N/A;  with dilation  . FOOT SURGERY     bone spurs  . NASAL SINUS SURGERY    . PLANTAR FASCIA RELEASE Right 08/03/2016   Procedure: PLANTAR FASCIA RELEASE WITH BONE SPUR EXCISION;  Surgeon: Dorna Leitz, MD;  Location: [facility];  Service: Orthopedics;  Laterality: Right;  . TONSILLECTOMY      There were no vitals filed for this visit.      [facility] PT Assessment - 01/26/20 0001      Strength   Overall Strength Comments  +trendelenburg in Rt hip                    [facility] Adult PT Treatment/Exercise - 01/26/20 0001      Knee/Hip Exercises: Stretches   Piriformis Stretch Limitations  seated figure 4    Other Knee/Hip Stretches  SL TFL      Knee/Hip Exercises: Standing   SLS  in mirror with cues for pelvis level      Knee/Hip Exercises: Supine   Bridges with Clamshell  Strengthening;Both;15 reps   blue tband     Knee/Hip Exercises: Sidelying   Hip ABduction  20 reps    Clams  x30    Other Sidelying Knee/Hip Exercises  rainbows      Manual Therapy   Manual therapy comments  skilled palpation and monitoring during TPDN    Joint Mobilization  manual stretching of Rt FA joint    Soft tissue mobilization  3       Trigger Point Dry Needling - 01/26/20 0001    Gluteus Maximus Response  Twitch response elicited;Palpable increased muscle length   Right   Tensor Fascia Lata Response  Twitch response elicited;Palpable increased muscle length   Right          PT Education - 01/26/20 1505    Education Details  anatomy of  condition    Person(s) Educated  Patient    Methods  Explanation;Demonstration;Tactile cues;Verbal cues    Comprehension  Verbalized understanding;Returned demonstration;Verbal cues required;Tactile cues required;Need further instruction       PT Short Term Goals - 01/14/20 8338      PT SHORT TERM GOAL #1   Title  pt will be independent in self correction utilizing MET    Status  Achieved      PT SHORT TERM GOAL #2   Title  pt will report ability to correct posture and maintain equal pressure    Status  Achieved        PT Long Term Goals - 01/26/20 1502      PT LONG TERM GOAL #1   Title  gross LE strength to 5/5    Baseline  lacking Rt hip abd strength- +trendelenburg in Rt SLS    Status  On-going      PT LONG TERM GOAL #2   Title  able to climb stairs to her apartment without knee pain    Status  Achieved      PT LONG TERM GOAL #3   Title  pt will be able to maintain a seated  position for at least 20 min comfortably    Baseline  gets very tight around Rt hip    Status  On-going      PT LONG TERM GOAL #4   Title  pt will be able to complete household chores without increase in knee pain    Status  Achieved            Plan - 01/26/20 1457    Clinical Impression Statement  Twitch response in Rt TFL that released tightness into thigh. Pt frustrated by pain but we did discuss that her soreness being more localized to Rt SIJ is normal as her pelvis stabilizes. Rt TFL tighter than Lt and more notable in stretches. Lt knee pain has nearly resolved but at risk of return if pelvis rotates again. Will continue to benefit from futher pelvic stability training to decrease pain and improve LE biomechanical chain function.    PT Treatment/Interventions  ADLs/Self Care Home Management;Cryotherapy;Electrical Stimulation;Iontophoresis '4mg'$ /ml Dexamethasone;Stair training;Functional mobility training;Gait training;Ultrasound;Traction;Moist Heat;Therapeutic activities;Therapeutic exercise;Balance training;Neuromuscular re-education;Patient/family education;Passive range of motion;Manual techniques;Dry needling;Taping;Spinal Manipulations;Joint Manipulations    PT Next Visit Plan  CKC stability- bosu/wobble board    PT Home Exercise Plan  MET PRN, posture/equal pressure stance, [id number], quad set with ball squeeze, seated hamstring, bridge with clam, figure 4    Consulted and Agree with Plan of Care  Patient       Patient will benefit from skilled therapeutic intervention in order to improve the following deficits and impairments:  Difficulty walking, Increased muscle spasms, Decreased activity tolerance, Pain, Improper body mechanics, Decreased balance, Decreased strength, Postural dysfunction  Visit Diagnosis: Chronic pain of left knee  Muscle weakness (generalized)     Problem List Patient Active Problem List   Diagnosis Date Noted  . Chest pain of uncertain etiology  25/04/3975  . Heart palpitations 09/05/2019  . Shortness of breath 09/05/2019  . Anxiety 09/05/2019  . Tachycardia 07/23/2019  . Sprain of right ankle 02/03/2019  . MDD (major depressive disorder), recurrent episode, moderate ([place]) 06/28/2017  . OCD (obsessive compulsive disorder) 03/26/2017  . Obstructive sleep apnea of adult 01/18/2017  . Insomnia 02/08/2016  . Heel spur 09/01/2015  . Family history of cystitis 08/31/2015  . Left foot pain 08/31/2015  . Prediabetes 08/23/2015  .  Hyperlipidemia 08/23/2015  . Depression 03/21/2015  . Acute esophagitis 02/09/2015  . Gastroesophageal reflux disease without esophagitis 08/27/2014  . Dysphagia, unspecified(787.20) 03/22/2014    Kelii Chittum C. Dimitri Dsouza PT, DPT 01/26/20 3:06 PM   [facility] [address] [place], [place], [address] Phone: [phone]   Fax:  [phone]  Name: Stephanie Serrano MRN: [id number] Date of Birth: 11-18-1966

## 2020-01-28 ENCOUNTER — Ambulatory Visit: Payer: Medicare Other | Admitting: Physical Therapy

## 2020-02-02 ENCOUNTER — Other Ambulatory Visit: Payer: Self-pay

## 2020-02-02 ENCOUNTER — Ambulatory Visit: Payer: Medicare Other | Admitting: Physical Therapy

## 2020-02-02 ENCOUNTER — Encounter: Payer: Self-pay | Admitting: Physical Therapy

## 2020-02-02 DIAGNOSIS — M25562 Pain in left knee: Secondary | ICD-10-CM

## 2020-02-02 DIAGNOSIS — G8929 Other chronic pain: Secondary | ICD-10-CM

## 2020-02-02 DIAGNOSIS — M6281 Muscle weakness (generalized): Secondary | ICD-10-CM

## 2020-02-02 NOTE — Therapy (Signed)
[place], [place], [address] Phone: [phone]   Fax:  [phone]  Physical Therapy Treatment  Patient Details  Name: Stephanie Serrano MRN: [id number] Date of Birth: July 17, 1966 Referring Provider (PT): Lowella Petties, MD   Encounter Date: 02/02/2020  PT End of Session - 02/02/20 1422    Visit Number  11    Number of Visits  17    Date for PT Re-Evaluation  02/12/20    Authorization Type  MCR/MCD    PT Start Time  1419    PT Stop Time  1507    PT Time Calculation (min)  48 min    Activity Tolerance  Patient tolerated treatment well    Behavior During Therapy  [facility] for tasks assessed/performed       Past Medical History:  Diagnosis Date  . Allergy   . Anemia   . Anxiety   . Arthritis   . Asthma   . Blood transfusion without reported diagnosis   . Depression   . Eczema   . Esophageal stricture   . GERD (gastroesophageal reflux disease)   . Migraines   . OCD (obsessive compulsive disorder)   . RLS (restless legs syndrome)   . Vertigo     Past Surgical History:  Procedure Laterality Date  . ESOPHAGOGASTRODUODENOSCOPY N/A 02/09/2015   Procedure: ESOPHAGOGASTRODUODENOSCOPY (EGD);  Surgeon: Inda Castle, MD;  Location: [facility];  Service: Endoscopy;  Laterality: N/A;  with dilation  . FOOT SURGERY     bone spurs  . NASAL SINUS SURGERY    . PLANTAR FASCIA RELEASE Right 08/03/2016   Procedure: PLANTAR FASCIA RELEASE WITH BONE SPUR EXCISION;  Surgeon: Dorna Leitz, MD;  Location: [facility] [facility];  Service: Orthopedics;  Laterality: Right;  . TONSILLECTOMY      There were no vitals filed for this visit.  Subjective Assessment - 02/02/20 1422    Subjective  Stiff at Rt SIJ but reduced soreness in musculature                       [facility] Adult PT Treatment/Exercise - 02/02/20 0001      Knee/Hip Exercises: Stretches   Gastroc Stretch  Both;30 seconds      Knee/Hip Exercises: Aerobic   Nustep  5 min L7 UE & LE      Knee/Hip Exercises: Standing   Rocker Board Limitations  lateral green tband around knees    Other Standing Knee Exercises  chair sit- dynamic & holds pulling from FM bar, green tband at knees      Modalities   Modalities  Ultrasound;Cryotherapy      Cryotherapy   Number Minutes Cryotherapy  10 Minutes    Cryotherapy Location  Lumbar Spine    Type of Cryotherapy  Ice pack      Ultrasound   Ultrasound Location  Rt SIJ    Ultrasound Parameters  1.0 w/cm2 pulsed    Ultrasound Goals  Pain             PT Education - 02/02/20 1704    Education Details  [place], progress & POC- upcoming re-eval    Person(s) Educated  Patient    Methods  Explanation    Comprehension  Verbalized understanding;Need further instruction       PT Short Term Goals - 01/14/20 5056      PT SHORT TERM GOAL #1   Title  pt will be independent in self  correction utilizing MET    Status  Achieved      PT SHORT TERM GOAL #2   Title  pt will report ability to correct posture and maintain equal pressure    Status  Achieved        PT Long Term Goals - 01/26/20 1502      PT LONG TERM GOAL #1   Title  gross LE strength to 5/5    Baseline  lacking Rt hip abd strength- +trendelenburg in Rt SLS    Status  On-going      PT LONG TERM GOAL #2   Title  able to climb stairs to her apartment without knee pain    Status  Achieved      PT LONG TERM GOAL #3   Title  pt will be able to maintain a seated position for at least 20 min comfortably    Baseline  gets very tight around Rt hip    Status  On-going      PT LONG TERM GOAL #4   Title  pt will be able to complete household chores without increase in knee pain    Status  Achieved            Plan - 02/02/20 1700    Clinical Impression Statement  Utilized [place] to reduce irritation at Rt SIJ. continued with exercises for stability. Fatigue noted in chair position.    PT Treatment/Interventions   ADLs/Self Care Home Management;Cryotherapy;Electrical Stimulation;Iontophoresis 92m/ml Dexamethasone;Stair training;Functional mobility training;Gait training;Ultrasound;Traction;Moist Heat;Therapeutic activities;Therapeutic exercise;Balance training;Neuromuscular re-education;Patient/family education;Passive range of motion;Manual techniques;Dry needling;Taping;Spinal Manipulations;Joint Manipulations    PT Next Visit Plan  cont CKC    PT Home Exercise Plan  MET PRN, posture/equal pressure stance, [id number], quad set with ball squeeze, seated hamstring, bridge with clam, figure 4, chair sit    Consulted and Agree with Plan of Care  Patient       Patient will benefit from skilled therapeutic intervention in order to improve the following deficits and impairments:  Difficulty walking, Increased muscle spasms, Decreased activity tolerance, Pain, Improper body mechanics, Decreased balance, Decreased strength, Postural dysfunction  Visit Diagnosis: Chronic pain of left knee  Muscle weakness (generalized)     Problem List Patient Active Problem List   Diagnosis Date Noted  . Chest pain of uncertain etiology 112/75/1700 . Heart palpitations 09/05/2019  . Shortness of breath 09/05/2019  . Anxiety 09/05/2019  . Tachycardia 07/23/2019  . Sprain of right ankle 02/03/2019  . MDD (major depressive disorder), recurrent episode, moderate ([place] 06/28/2017  . OCD (obsessive compulsive disorder) 03/26/2017  . Obstructive sleep apnea of adult 01/18/2017  . Insomnia 02/08/2016  . Heel spur 09/01/2015  . Family history of cystitis 08/31/2015  . Left foot pain 08/31/2015  . Prediabetes 08/23/2015  . Hyperlipidemia 08/23/2015  . Depression 03/21/2015  . Acute esophagitis 02/09/2015  . Gastroesophageal reflux disease without esophagitis 08/27/2014  . Dysphagia, unspecified(787.20) 03/22/2014    Stephanie Serrano C. Stephanie Serrano PT, DPT 02/02/20 5:07 PM   [facility] [address]Phone: [phone]  Fax:  [phone] Name: AHavannah StreatMRN: [id number] of Birth: 121-Jan-1967

## 2020-02-04 ENCOUNTER — Ambulatory Visit: Payer: Medicare Other | Admitting: Physical Therapy

## 2020-02-04 ENCOUNTER — Encounter: Payer: Self-pay | Admitting: Physical Therapy

## 2020-02-04 ENCOUNTER — Other Ambulatory Visit: Payer: Self-pay

## 2020-02-04 DIAGNOSIS — M6281 Muscle weakness (generalized): Secondary | ICD-10-CM

## 2020-02-04 DIAGNOSIS — M25562 Pain in left knee: Secondary | ICD-10-CM

## 2020-02-04 DIAGNOSIS — G8929 Other chronic pain: Secondary | ICD-10-CM

## 2020-02-04 NOTE — Therapy (Signed)
Stephanie Serrano, Stephanie Serrano, [address] Phone: [phone]   Fax:  [phone]  Physical Therapy Treatment  Patient Details  Name: Stephanie Serrano MRN: [id number] Date of Birth: Aug 09, 1966 Referring Provider (PT): Stephanie Petties, MD   Encounter Date: 02/04/2020  PT End of Session - 02/04/20 1501    Visit Number  12    Number of Visits  17    Date for PT Re-Evaluation  02/12/20    Authorization Type  MCR/MCD    PT Start Time  1418    PT Stop Time  1500    PT Time Calculation (min)  42 min    Activity Tolerance  Patient tolerated treatment well    Behavior During Therapy  [name] for tasks assessed/performed       Past Medical History:  Diagnosis Date  . Allergy   . Anemia   . Anxiety   . Arthritis   . Asthma   . Blood transfusion without reported diagnosis   . Depression   . Eczema   . Esophageal stricture   . GERD (gastroesophageal reflux disease)   . Migraines   . OCD (obsessive compulsive disorder)   . RLS (restless legs syndrome)   . Vertigo     Past Surgical History:  Procedure Laterality Date  . ESOPHAGOGASTRODUODENOSCOPY N/A 02/09/2015   Procedure: ESOPHAGOGASTRODUODENOSCOPY (EGD);  Surgeon: Inda Castle, MD;  Location: [facility];  Service: Endoscopy;  Laterality: N/A;  with dilation  . FOOT SURGERY     bone spurs  . NASAL SINUS SURGERY    . PLANTAR FASCIA RELEASE Right 08/03/2016   Procedure: PLANTAR FASCIA RELEASE WITH BONE SPUR EXCISION;  Surgeon: Dorna Leitz, MD;  Location: [facility];  Service: Orthopedics;  Laterality: Right;  . TONSILLECTOMY      There were no vitals filed for this visit.  Subjective Assessment - 02/04/20 1421    Subjective  Pulling at medial Left knee and tightness at Rt SIJ.                       Stephanie Serrano Adult PT Treatment/Exercise - 02/04/20 0001      Knee/Hip Exercises: Stretches   Sports administrator Limitations  standing heel to  seat    Piriformis Stretch Limitations  seated figure 4      Knee/Hip Exercises: Aerobic   Elliptical  5 min L1 ramp 10      Knee/Hip Exercises: Standing   Heel Raises Limitations  ball bw knees    Forward Step Up  Step Height: 6"    Forward Step Up Limitations  with knee drive    Other Standing Knee Exercises  T-hinge    Other Standing Knee Exercises  chair sit ball bw knees      Manual Therapy   Manual therapy comments  skilled palpation and monitoring during TPDN    Joint Mobilization  IASTM Lt quads in lengthened position    Soft tissue mobilization  Rt glut max       Trigger Point Dry Needling - 02/04/20 0001    Gluteus Maximus Response  Twitch response elicited;Palpable increased muscle length   just lateral to Rt SIJ            PT Short Term Goals - 01/14/20 9323      PT SHORT TERM GOAL #1   Title  pt will be independent in self correction utilizing MET    Status  Achieved  PT SHORT TERM GOAL #2   Title  pt will report ability to correct posture and maintain equal pressure    Status  Achieved        PT Long Term Goals - 01/26/20 1502      PT LONG TERM GOAL #1   Title  gross LE strength to 5/5    Baseline  lacking Rt hip abd strength- +trendelenburg in Rt SLS    Status  On-going      PT LONG TERM GOAL #2   Title  able to climb stairs to her apartment without knee pain    Status  Achieved      PT LONG TERM GOAL #3   Title  pt will be able to maintain a seated position for at least 20 min comfortably    Baseline  gets very tight around Rt hip    Status  On-going      PT LONG TERM GOAL #4   Title  pt will be able to complete household chores without increase in knee pain    Status  Achieved            Plan - 02/04/20 1637    Clinical Impression Statement  Limited hip stability contributing to continued sacral pain in SLS while bending to don shoe. Was able to do this without pain following stabilization exercises today- asked her to try  with a ball bw knees at home if the sensation returns. Will continue with dynamic balance stability and hip strength/stability.    PT Treatment/Interventions  ADLs/Self Care Home Management;Cryotherapy;Electrical Stimulation;Iontophoresis 23m/ml Dexamethasone;Stair training;Functional mobility training;Gait training;Ultrasound;Traction;Moist Heat;Therapeutic activities;Therapeutic exercise;Balance training;Neuromuscular re-education;Patient/family education;Passive range of motion;Manual techniques;Dry needling;Taping;Spinal Manipulations;Joint Manipulations    PT Next Visit Plan  CKC balance, core strength    PT Home Exercise Plan  MET PRN, posture/equal pressure stance, [id number], quad set with ball squeeze, seated hamstring, bridge with clam, figure 4, chair sit    Consulted and Agree with Plan of Care  Patient       Patient will benefit from skilled therapeutic intervention in order to improve the following deficits and impairments:  Difficulty walking, Increased muscle spasms, Decreased activity tolerance, Pain, Improper body mechanics, Decreased balance, Decreased strength, Postural dysfunction  Visit Diagnosis: Chronic pain of left knee  Muscle weakness (generalized)     Problem List Patient Active Problem List   Diagnosis Date Noted  . Chest pain of uncertain etiology 116/38/4536 . Heart palpitations 09/05/2019  . Shortness of breath 09/05/2019  . Anxiety 09/05/2019  . Tachycardia 07/23/2019  . Sprain of right ankle 02/03/2019  . MDD (major depressive disorder), recurrent episode, moderate ([place] 06/28/2017  . OCD (obsessive compulsive disorder) 03/26/2017  . Obstructive sleep apnea of adult 01/18/2017  . Insomnia 02/08/2016  . Heel spur 09/01/2015  . Family history of cystitis 08/31/2015  . Left foot pain 08/31/2015  . Prediabetes 08/23/2015  . Hyperlipidemia 08/23/2015  . Depression 03/21/2015  . Acute esophagitis 02/09/2015  . Gastroesophageal reflux disease without  esophagitis 08/27/2014  . Dysphagia, unspecified(787.20) 03/22/2014    Stephanie Serrano C. Stephanie Serrano PT, DPT 02/04/20 4:39 PM   [facility] [address]Phone: [phone]  Fax:  [phone] Name: Stephanie NaabMRN: [id number] of Birth: [birth year]

## 2020-02-09 ENCOUNTER — Other Ambulatory Visit: Payer: Self-pay

## 2020-02-09 ENCOUNTER — Encounter: Payer: Self-pay | Admitting: Physical Therapy

## 2020-02-09 ENCOUNTER — Ambulatory Visit: Payer: Medicare Other | Attending: Orthopedic Surgery | Admitting: Physical Therapy

## 2020-02-09 DIAGNOSIS — G8929 Other chronic pain: Secondary | ICD-10-CM | POA: Diagnosis present

## 2020-02-09 DIAGNOSIS — M25562 Pain in left knee: Secondary | ICD-10-CM | POA: Diagnosis not present

## 2020-02-09 DIAGNOSIS — M6281 Muscle weakness (generalized): Secondary | ICD-10-CM | POA: Insufficient documentation

## 2020-02-09 NOTE — Therapy (Signed)
[place], [place], [address] Phone: [phone]   Fax:  [phone]  Physical Therapy Treatment  Patient Details  Name: Stephanie Serrano MRN: [id number] Date of Birth: 05-19-1966 Referring Provider (PT): Lowella Petties, MD   Encounter Date: 02/09/2020  PT End of Session - 02/09/20 1423    Visit Number  13    Number of Visits  17    Date for PT Re-Evaluation  02/12/20    Authorization Type  MCR/MCD    PT Start Time  1415    PT Stop Time  1501    PT Time Calculation (min)  46 min    Activity Tolerance  Patient tolerated treatment well    Behavior During Therapy  [facility] for tasks assessed/performed       Past Medical History:  Diagnosis Date  . Allergy   . Anemia   . Anxiety   . Arthritis   . Asthma   . Blood transfusion without reported diagnosis   . Depression   . Eczema   . Esophageal stricture   . GERD (gastroesophageal reflux disease)   . Migraines   . OCD (obsessive compulsive disorder)   . RLS (restless legs syndrome)   . Vertigo     Past Surgical History:  Procedure Laterality Date  . ESOPHAGOGASTRODUODENOSCOPY N/A 02/09/2015   Procedure: ESOPHAGOGASTRODUODENOSCOPY (EGD);  Surgeon: Inda Castle, MD;  Location: [facility];  Service: Endoscopy;  Laterality: N/A;  with dilation  . FOOT SURGERY     bone spurs  . NASAL SINUS SURGERY    . PLANTAR FASCIA RELEASE Right 08/03/2016   Procedure: PLANTAR FASCIA RELEASE WITH BONE SPUR EXCISION;  Surgeon: Dorna Leitz, MD;  Location: [facility];  Service: Orthopedics;  Laterality: Right;  . TONSILLECTOMY      There were no vitals filed for this visit.  Subjective Assessment - 02/09/20 1425    Subjective  Lt knee medial to patella hurting today. Standing on Rt foot to don Lt shoe- hurts Rt SIJ.    Patient Stated Goals  stability through hips, no pain with sitting, stairs (lives on 3rd floor)         [facility] PT Assessment -  02/09/20 0001      Assessment   Medical Diagnosis  Lt knee pain    Referring Provider (PT)  Lowella Petties, MD      Special Tests   Other special tests  anterior drawer incr laxity on Lt. +medial knee joint line TTP, + mcmurray for med meniscus                   [facility] Adult PT Treatment/Exercise - 02/09/20 0001      Knee/Hip Exercises: Standing   SLS  draw ABCs with red plyoball    Other Standing Knee Exercises  T-hinge   one finger for support     Ultrasound   Ultrasound Location  Lt medial knee    Ultrasound Parameters  1.0w/cm2 cont    Ultrasound Goals  Pain             PT Education - 02/09/20 1503    Education Details  knee tests    Person(s) Educated  Patient    Methods  Explanation    Comprehension  Verbalized understanding       PT Short Term Goals - 01/14/20 0938      PT SHORT TERM GOAL #1   Title  pt will be  independent in self correction utilizing MET    Status  Achieved      PT SHORT TERM GOAL #2   Title  pt will report ability to correct posture and maintain equal pressure    Status  Achieved        PT Long Term Goals - 01/26/20 1502      PT LONG TERM GOAL #1   Title  gross LE strength to 5/5    Baseline  lacking Rt hip abd strength- +trendelenburg in Rt SLS    Status  On-going      PT LONG TERM GOAL #2   Title  able to climb stairs to her apartment without knee pain    Status  Achieved      PT LONG TERM GOAL #3   Title  pt will be able to maintain a seated position for at least 20 min comfortably    Baseline  gets very tight around Rt hip    Status  On-going      PT LONG TERM GOAL #4   Title  pt will be able to complete household chores without increase in knee pain    Status  Achieved            Plan - 02/09/20 1507    Clinical Impression Statement  Pt still having pain in Rt SIJ but demo significant laxity in Lt knee vs Rt. Reports she has fallen quite a few times in her life with one- while pregnant- standing  out as having a lot of knee pain following but did not f/u. Discussed possibility of damage to medial meniscus and ACL but unable to say for sure with special testing. Will continue to work on stability from surrounding musculature to stabilize knee.    PT Treatment/Interventions  ADLs/Self Care Home Management;Cryotherapy;Electrical Stimulation;Iontophoresis '4mg'$ /ml Dexamethasone;Stair training;Functional mobility training;Gait training;Ultrasound;Traction;Moist Heat;Therapeutic activities;Therapeutic exercise;Balance training;Neuromuscular re-education;Patient/family education;Passive range of motion;Manual techniques;Dry needling;Taping;Spinal Manipulations;Joint Manipulations    PT Home Exercise Plan  MET PRN, posture/equal pressure stance, [id number], quad set with ball squeeze, seated hamstring, bridge with clam, figure 4, chair sit, T-hinge, SLS + ABCs    Consulted and Agree with Plan of Care  Patient       Patient will benefit from skilled therapeutic intervention in order to improve the following deficits and impairments:  Difficulty walking, Increased muscle spasms, Decreased activity tolerance, Pain, Improper body mechanics, Decreased balance, Decreased strength, Postural dysfunction  Visit Diagnosis: Chronic pain of left knee  Muscle weakness (generalized)     Problem List Patient Active Problem List   Diagnosis Date Noted  . Chest pain of uncertain etiology 25/63/8937  . Heart palpitations 09/05/2019  . Shortness of breath 09/05/2019  . Anxiety 09/05/2019  . Tachycardia 07/23/2019  . Sprain of right ankle 02/03/2019  . MDD (major depressive disorder), recurrent episode, moderate ([place]) 06/28/2017  . OCD (obsessive compulsive disorder) 03/26/2017  . Obstructive sleep apnea of adult 01/18/2017  . Insomnia 02/08/2016  . Heel spur 09/01/2015  . Family history of cystitis 08/31/2015  . Left foot pain 08/31/2015  . Prediabetes 08/23/2015  . Hyperlipidemia 08/23/2015  .  Depression 03/21/2015  . Acute esophagitis 02/09/2015  . Gastroesophageal reflux disease without esophagitis 08/27/2014  . Dysphagia, unspecified(787.20) 03/22/2014    Kabrina Christiano C. Jadore Veals PT, DPT 02/09/20 3:13 PM   [place], [place], [address] Phone: [phone]   Fax:  [phone]  Name: Stephanie Serrano MRN: [id number] Date of Birth:  02/22/1966   

## 2020-02-11 ENCOUNTER — Ambulatory Visit: Payer: Medicare Other | Admitting: Physical Therapy

## 2020-02-11 ENCOUNTER — Other Ambulatory Visit: Payer: Self-pay

## 2020-02-11 ENCOUNTER — Encounter: Payer: Self-pay | Admitting: Physical Therapy

## 2020-02-11 DIAGNOSIS — M25562 Pain in left knee: Secondary | ICD-10-CM

## 2020-02-11 DIAGNOSIS — M6281 Muscle weakness (generalized): Secondary | ICD-10-CM

## 2020-02-11 DIAGNOSIS — G8929 Other chronic pain: Secondary | ICD-10-CM

## 2020-02-11 NOTE — Therapy (Addendum)
[place], [place], [address] Phone: [phone]   Fax:  [phone]  Physical Therapy Treatment/Re-certification  Patient Details  Name: Stephanie Serrano MRN: [id number] Date of Birth: 1966-01-08 Referring Provider (PT): Lowella Petties, MD   Encounter Date: 02/11/2020  PT End of Session - 02/11/20 1420    Visit Number  14    Number of Visits  22    Date for PT Re-Evaluation  03/11/20    Authorization Type  MCR/MCD    PT Start Time  1417    PT Stop Time  1455    PT Time Calculation (min)  38 min    Activity Tolerance  Patient tolerated treatment well    Behavior During Therapy  [facility] for tasks assessed/performed       Past Medical History:  Diagnosis Date  . Allergy   . Anemia   . Anxiety   . Arthritis   . Asthma   . Blood transfusion without reported diagnosis   . Depression   . Eczema   . Esophageal stricture   . GERD (gastroesophageal reflux disease)   . Migraines   . OCD (obsessive compulsive disorder)   . RLS (restless legs syndrome)   . Vertigo     Past Surgical History:  Procedure Laterality Date  . ESOPHAGOGASTRODUODENOSCOPY N/A 02/09/2015   Procedure: ESOPHAGOGASTRODUODENOSCOPY (EGD);  Surgeon: Inda Castle, MD;  Location: [facility];  Service: Endoscopy;  Laterality: N/A;  with dilation  . FOOT SURGERY     bone spurs  . NASAL SINUS SURGERY    . PLANTAR FASCIA RELEASE Right 08/03/2016   Procedure: PLANTAR FASCIA RELEASE WITH BONE SPUR EXCISION;  Surgeon: Dorna Leitz, MD;  Location: [facility];  Service: Orthopedics;  Laterality: Right;  . TONSILLECTOMY      There were no vitals filed for this visit.  Subjective Assessment - 02/11/20 1419    Subjective  Had some pain earlier and was able to calm it down with stretches and pressure point stuff         [facility] PT Assessment - 02/11/20 0001      Assessment   Medical Diagnosis  Lt knee pain    Referring  Provider (PT)  Lowella Petties, MD    Hand Dominance  Right      Precautions   Precautions  None      Restrictions   Weight Bearing Restrictions  No      Balance Screen   Has the patient fallen in the past 6 months  Yes    How many times?  1   tripped over a rug at church   Has the patient had a decrease in activity level because of a fear of falling?   No    Is the patient reluctant to leave their home because of a fear of falling?   No      Home Environment   Living Environment  Private residence    Living Arrangements  Children    Type of Home  Apartment    Home Access  Stairs to enter   3rd floor     Prior Function   Level of Independence  Independent      Cognition   Overall Cognitive Status  Within Functional Limits for tasks assessed      Sensation   Additional Comments  [facility]      Strength   Right Hip Flexion  4+/5    Right  Hip Extension  4/5    Right Hip ABduction  4/5    Left Hip Flexion  4/5    Left Hip Extension  4-/5    Left Hip ABduction  5/5    Right Knee Flexion  4+/5    Right Knee Extension  5/5    Left Knee Flexion  4+/5    Left Knee Extension  5/5      Special Tests   Other special tests  anterior drawer incr laxity on Lt. +medial knee joint line TTP, + mcmurray for med meniscus      High Level Balance   High Level Balance Comments  Lt SLS 10s with Lt trunk lean; Rt SLS unstable <5s                   [facility] Adult PT Treatment/Exercise - 02/11/20 0001      Knee/Hip Exercises: Supine   Bridges with Clamshell  Both   green tband, 2lb chops with bridges   Bridges with Clamshell  Both   butterfly bridge green band, bridge+march   Other Supine Knee/Hip Exercises  v-up ball handoff      Knee/Hip Exercises: Prone   Other Prone Exercises  downward dog             PT Education - 02/11/20 1506    Education Details  goals, HEP, POC    Person(s) Educated  Patient    Methods  Explanation;Demonstration;Tactile cues;Verbal  cues;Handout    Comprehension  Verbalized understanding;Returned demonstration;Verbal cues required;Tactile cues required;Need further instruction       PT Short Term Goals - 01/14/20 8101      PT SHORT TERM GOAL #1   Title  pt will be independent in self correction utilizing MET    Status  Achieved      PT SHORT TERM GOAL #2   Title  pt will report ability to correct posture and maintain equal pressure    Status  Achieved        PT Long Term Goals - 02/11/20 1427      PT LONG TERM GOAL #1   Title  gross LE strength to 5/5    Baseline  see flowsheet    Status  On-going      PT LONG TERM GOAL #2   Title  able to climb stairs to her apartment without knee pain    Baseline  achieved reported in past- yesterday had to go up and down a few times and it hurt at the end    Status  Achieved      PT LONG TERM GOAL #3   Title  pt will be able to maintain a seated position for at least 20 min comfortably    Baseline  usually lay down becuase the Right side of my hip starts to ache, improved to about 15 min      PT LONG TERM GOAL #4   Title  pt will be able to complete household chores without increase in knee pain    Status  Achieved      PT LONG TERM GOAL #5   Title  bil SLS to 15s and stable on stable surface to indicate improved functional strength    Baseline  see flowsheet    Status  On-going    Target Date  03/11/20            Plan - 02/11/20 1506    Clinical Impression Statement  Pt has made strength  improvements as well as improved functional ability. She is getting better at utilizing [facility] and stretches to manage pain but is still noticing limitations. Notable weakness in gross musculature in lumbopelvic region into LE biomechanical chain. Poor balance on Rt side. Significant laxity found in Lt ant drawer compared to Rt. Will extend POC in order to progress stability training and meet functional goals.    Rehab Potential  Good    PT Frequency  2x / week    PT  Duration  4 weeks    PT Treatment/Interventions  ADLs/Self Care Home Management;Cryotherapy;Electrical Stimulation;Iontophoresis 42m/ml Dexamethasone;Stair training;Functional mobility training;Gait training;Ultrasound;Traction;Moist Heat;Therapeutic activities;Therapeutic exercise;Balance training;Neuromuscular re-education;Patient/family education;Passive range of motion;Manual techniques;Dry needling;Taping;Spinal Manipulations;Joint Manipulations    PT Next Visit Plan  progress [place] hip abd strength, how did bridges go? [place] Agree with Plan of Care  Patient       Patient will benefit from skilled therapeutic intervention in order to improve the following deficits and impairments:  Difficulty walking, Increased muscle spasms, Decreased activity tolerance, Pain, Improper body mechanics, Decreased balance, Decreased strength, Postural dysfunction  Visit Diagnosis: Chronic pain of left knee - Plan: PT plan of care cert/re-cert  Muscle weakness (generalized) - Plan: PT plan of care cert/re-cert     Problem List Patient Active Problem List   Diagnosis Date Noted  . Chest pain of uncertain etiology 158/34/6219 . Heart palpitations 09/05/2019  . Shortness of breath 09/05/2019  . Anxiety 09/05/2019  . Tachycardia 07/23/2019  . Sprain of right ankle 02/03/2019  . MDD (major depressive disorder), recurrent episode, moderate ([place] 06/28/2017  . OCD (obsessive compulsive disorder) 03/26/2017  . Obstructive sleep apnea of adult 01/18/2017  . Insomnia 02/08/2016  . Heel spur 09/01/2015  . Family history of cystitis 08/31/2015  . Left foot pain 08/31/2015  . Prediabetes 08/23/2015  . Hyperlipidemia 08/23/2015  . Depression 03/21/2015  . Acute esophagitis 02/09/2015  . Gastroesophageal reflux disease without esophagitis 08/27/2014  . Dysphagia, unspecified(787.20) 03/22/2014  Stephanie Mandell C. Stephanie Serrano PT, DPT 02/11/20 3:12 PM   [place] [address]Phone: [phone]  Fax:  [phone] Name: ABettylou FrewMRN: [id number] of Birth: [birth year]

## 2020-02-16 ENCOUNTER — Encounter: Payer: Medicare Other | Admitting: Physical Therapy

## 2020-02-18 ENCOUNTER — Encounter: Payer: Medicare Other | Admitting: Physical Therapy

## 2020-02-18 ENCOUNTER — Other Ambulatory Visit: Payer: Self-pay

## 2020-02-18 ENCOUNTER — Ambulatory Visit: Payer: Medicare Other | Admitting: Physical Therapy

## 2020-02-18 ENCOUNTER — Encounter: Payer: Self-pay | Admitting: Physical Therapy

## 2020-02-18 DIAGNOSIS — M25562 Pain in left knee: Secondary | ICD-10-CM | POA: Diagnosis not present

## 2020-02-18 DIAGNOSIS — G8929 Other chronic pain: Secondary | ICD-10-CM

## 2020-02-18 DIAGNOSIS — M6281 Muscle weakness (generalized): Secondary | ICD-10-CM

## 2020-02-18 NOTE — Therapy (Signed)
Stephanie [facility], [place], [address] Phone: [phone]   Fax:  [phone]  Physical Therapy Treatment  Patient Details  Name: Stephanie Serrano MRN: [id number] Date of Birth: 05-21-1966 Referring Provider (PT): Stephanie Petties, MD   Encounter Date: 02/18/2020  PT End of Session - 02/18/20 1106    Visit Number  15    Number of Visits  22    Date for PT Re-Evaluation  03/11/20    Authorization Type  MCR/MCD    PT Start Time  1100    PT Stop Time  1154    PT Time Calculation (min)  54 min    Activity Tolerance  Patient tolerated treatment well    Behavior During Therapy  Stephanie [facility] for tasks assessed/performed       Past Medical History:  Diagnosis Date  . Allergy   . Anemia   . Anxiety   . Arthritis   . Asthma   . Blood transfusion without reported diagnosis   . Depression   . Eczema   . Esophageal stricture   . GERD (gastroesophageal reflux disease)   . Migraines   . OCD (obsessive compulsive disorder)   . RLS (restless legs syndrome)   . Vertigo     Past Surgical History:  Procedure Laterality Date  . ESOPHAGOGASTRODUODENOSCOPY N/A 02/09/2015   Procedure: ESOPHAGOGASTRODUODENOSCOPY (EGD);  Surgeon: Stephanie Castle, MD;  Location: [facility];  Service: [facility];  Laterality: N/A;  with dilation  . FOOT SURGERY     bone spurs  . NASAL SINUS SURGERY    . PLANTAR FASCIA RELEASE Right 08/03/2016   Procedure: PLANTAR FASCIA RELEASE WITH BONE SPUR EXCISION;  Surgeon: Stephanie Leitz, MD;  Location: [facility];  Service: Orthopedics;  Laterality: Right;  . TONSILLECTOMY      There were no vitals filed for this visit.      [facility] PT Assessment - 02/18/20 0001      Observation/Other Assessments   Focus on Therapeutic Outcomes (FOTO)   47% limited                   [facility] Adult PT Treatment/Exercise - 02/18/20 0001      Knee/Hip Exercises: Stretches   Piriformis Stretch  Limitations  supine figure 4      Knee/Hip Exercises: Machines for Strengthening   Cybex Leg Press  2 plates, 2 leg & 2 leg press with single lower    Other Machine  cybex standing hip ext- tactile cues to avoid trunk flexion.       Knee/Hip Exercises: Standing   Rocker Board Limitations  lateral balance      Modalities   Modalities  Moist Heat      Moist Heat Therapy   Number Minutes Moist Heat  10 Minutes    Moist Heat Location  Knee   Lt     Manual Therapy   Soft tissue mobilization  Rt piriformis, roller ITB VL & hamstrings               PT Short Term Goals - 01/14/20 7026      PT SHORT TERM GOAL #1   Title  pt will be independent in self correction utilizing MET    Status  Achieved      PT SHORT TERM GOAL #2   Title  pt will report ability to correct posture and maintain equal pressure    Status  Achieved  PT Long Term Goals - 02/11/20 1427      PT LONG TERM GOAL #1   Title  gross LE strength to 5/5    Baseline  see flowsheet    Status  On-going      PT LONG TERM GOAL #2   Title  able to climb stairs to her apartment without knee pain    Baseline  achieved reported in past- yesterday had to go up and down a few times and it hurt at the end    Status  Achieved      PT LONG TERM GOAL #3   Title  pt will be able to maintain a seated position for at least 20 min comfortably    Baseline  usually lay down becuase the Right side of my hip starts to ache, improved to about 15 min      PT LONG TERM GOAL #4   Title  pt will be able to complete household chores without increase in knee pain    Status  Achieved      PT LONG TERM GOAL #5   Title  bil SLS to 15s and stable on stable surface to indicate improved functional strength    Baseline  see flowsheet    Status  On-going    Target Date  03/11/20            Plan - 02/18/20 1157    Clinical Impression Statement  Tightness in piriformis released with STM. Tactile cues required for hip  extension to isolate rather than bend at waist. asked for some heat on her left knee following.    PT Treatment/Interventions  ADLs/Self Care Home Management;Cryotherapy;Electrical Stimulation;Iontophoresis 37m/ml Dexamethasone;Stair training;Functional mobility training;Gait training;Ultrasound;Traction;Moist Heat;Therapeutic activities;Therapeutic exercise;Balance training;Neuromuscular re-education;Patient/family education;Passive range of motion;Manual techniques;Dry needling;Taping;Spinal Manipulations;Joint Manipulations    PT Next Visit Plan  progress OCK strength    PT Home Exercise Plan  [facility]   Consulted and Agree with Plan of Care  Patient       Patient will benefit from skilled therapeutic intervention in order to improve the following deficits and impairments:  Difficulty walking, Increased muscle spasms, Decreased activity tolerance, Pain, Improper body mechanics, Decreased balance, Decreased strength, Postural dysfunction  Visit Diagnosis: Chronic pain of left knee  Muscle weakness (generalized)     Problem List Patient Active Problem List   Diagnosis Date Noted  . Chest pain of uncertain etiology 123/55/7322 . Heart palpitations 09/05/2019  . Shortness of breath 09/05/2019  . Anxiety 09/05/2019  . Tachycardia 07/23/2019  . Sprain of right ankle 02/03/2019  . MDD (major depressive disorder), recurrent episode, moderate ([place] 06/28/2017  . OCD (obsessive compulsive disorder) 03/26/2017  . Obstructive sleep apnea of adult 01/18/2017  . Insomnia 02/08/2016  . Heel spur 09/01/2015  . Family history of cystitis 08/31/2015  . Left foot pain 08/31/2015  . Prediabetes 08/23/2015  . Hyperlipidemia 08/23/2015  . Depression 03/21/2015  . Acute esophagitis 02/09/2015  . Gastroesophageal reflux disease without esophagitis 08/27/2014  . Dysphagia, unspecified(787.20) 03/22/2014    Stephanie Serrano PT, DPT 02/18/20 12:07 PM   [facility]GP[address] [address]Phone: [phone]  Fax:  [phone] Name: AAzaela CaracciMRN: [id number] of Birth: [birth year]

## 2020-02-23 ENCOUNTER — Other Ambulatory Visit: Payer: Self-pay

## 2020-02-23 ENCOUNTER — Ambulatory Visit: Payer: Medicare Other | Admitting: Physical Therapy

## 2020-02-23 ENCOUNTER — Encounter: Payer: Self-pay | Admitting: Physical Therapy

## 2020-02-23 DIAGNOSIS — M25562 Pain in left knee: Secondary | ICD-10-CM

## 2020-02-23 DIAGNOSIS — G8929 Other chronic pain: Secondary | ICD-10-CM

## 2020-02-23 DIAGNOSIS — M6281 Muscle weakness (generalized): Secondary | ICD-10-CM

## 2020-02-23 NOTE — Therapy (Signed)
[place], [place], [address] Phone: [phone]   Fax:  [phone]  Physical Therapy Treatment  Patient Details  Name: Stephanie Serrano MRN: [id number] Date of Birth: [birth year] Referring Provider (PT): Lowella Petties, MD   Encounter Date: 02/23/2020  PT End of Session - 02/23/20 1233    Visit Number  16    Number of Visits  22    Date for PT Re-Evaluation  03/11/20    PT Start Time  7622    PT Stop Time  1228    PT Time Calculation (min)  39 min    Activity Tolerance  Patient tolerated treatment well    Behavior During Therapy  [facility] for tasks assessed/performed       Past Medical History:  Diagnosis Date  . Allergy   . Anemia   . Anxiety   . Arthritis   . Asthma   . Blood transfusion without reported diagnosis   . Depression   . Eczema   . Esophageal stricture   . GERD (gastroesophageal reflux disease)   . Migraines   . OCD (obsessive compulsive disorder)   . RLS (restless legs syndrome)   . Vertigo     Past Surgical History:  Procedure Laterality Date  . ESOPHAGOGASTRODUODENOSCOPY N/A 02/09/2015   Procedure: ESOPHAGOGASTRODUODENOSCOPY (EGD);  Surgeon: Inda Castle, MD;  [facility];  Service: Endoscopy;  Laterality: N/A;  with dilation  . FOOT SURGERY     bone spurs  . NASAL SINUS SURGERY    . PLANTAR FASCIA RELEASE Right 08/03/2016   Procedure: PLANTAR FASCIA RELEASE WITH BONE SPUR EXCISION;  Surgeon: Dorna Leitz, MD;  Location: [place];  Service: Orthopedics;  Laterality: Right;  . TONSILLECTOMY      There were no vitals filed for this visit.  Subjective Assessment - 02/23/20 1152    Subjective  The most problems is in my upper back. Knee and lower back act funny sometimes but is getitng stronger.    Patient Stated Goals  stability through hips, no pain with sitting, stairs (lives on 3rd floor)         [facility] PT Assessment - 02/23/20 0001      Strength   Right Hip ABduction  4-/5    Left Hip ABduction  4/5                   [facility] Adult PT Treatment/Exercise - 02/23/20 0001      Exercises   Exercises  Other Exercises    Other Exercises   supine chest stretch, open book, seated thoracic extension      Knee/Hip Exercises: Stretches   Passive Hamstring Stretch Limitations  seated edge of chair    Piriformis Stretch Limitations  supine figure 4    Gastroc Stretch Limitations  edge of step      Knee/Hip Exercises: Standing   Heel Raises Limitations  on edge of step      Knee/Hip Exercises: Sidelying   Hip ABduction Limitations  hip burner- circles, arcs, flx/ext, clam/reverse    Hip ADduction Limitations  sidelying opp foot on table in front               PT Short Term Goals - 01/14/20 6333      PT SHORT TERM GOAL #1   Title  pt will be independent in self correction utilizing MET    Status  Achieved      PT SHORT  TERM GOAL #2   Title  pt will report ability to correct posture and maintain equal pressure    Status  Achieved        PT Long Term Goals - 02/11/20 1427      PT LONG TERM GOAL #1   Title  gross LE strength to 5/5    Baseline  see flowsheet    Status  On-going      PT LONG TERM GOAL #2   Title  able to climb stairs to her apartment without knee pain    Baseline  achieved reported in past- yesterday had to go up and down a few times and it hurt at the end    Status  Achieved      PT LONG TERM GOAL #3   Title  pt will be able to maintain a seated position for at least 20 min comfortably    Baseline  usually lay down becuase the Right side of my hip starts to ache, improved to about 15 min      PT LONG TERM GOAL #4   Title  pt will be able to complete household chores without increase in knee pain    Status  Achieved      PT LONG TERM GOAL #5   Title  bil SLS to 15s and stable on stable surface to indicate improved functional strength    Baseline  see flowsheet    Status   On-going    Target Date  03/11/20            Plan - 02/23/20 1232    Clinical Impression Statement  pt reported noted increase in thoracic stiffness so we quickly when over a couple of stretches. Reported significant fatigue in hip abd with exercises but discussed importance in strengthening as her measures have decreased.    PT Treatment/Interventions  ADLs/Self Care Home Management;Cryotherapy;Electrical Stimulation;Iontophoresis '4mg'$ /ml Dexamethasone;Stair training;Functional mobility training;Gait training;Ultrasound;Traction;Moist Heat;Therapeutic activities;Therapeutic exercise;Balance training;Neuromuscular re-education;Patient/family education;Passive range of motion;Manual techniques;Dry needling;Taping;Spinal Manipulations;Joint Manipulations    PT Next Visit Plan  review hip burner series, CKC, core    PT Home Exercise Plan  [facility]    Consulted and Agree with Plan of Care  Patient       Patient will benefit from skilled therapeutic intervention in order to improve the following deficits and impairments:  Difficulty walking, Increased muscle spasms, Decreased activity tolerance, Pain, Improper body mechanics, Decreased balance, Decreased strength, Postural dysfunction  Visit Diagnosis: Chronic pain of left knee  Muscle weakness (generalized)     Problem List Patient Active Problem List   Diagnosis Date Noted  . Chest pain of uncertain etiology 74/94/4967  . Heart palpitations 09/05/2019  . Shortness of breath 09/05/2019  . Anxiety 09/05/2019  . Tachycardia 07/23/2019  . Sprain of right ankle 02/03/2019  . MDD (major depressive disorder), recurrent episode, moderate ([place]) 06/28/2017  . OCD (obsessive compulsive disorder) 03/26/2017  . Obstructive sleep apnea of adult 01/18/2017  . Insomnia 02/08/2016  . Heel spur 09/01/2015  . Family history of cystitis 08/31/2015  . Left foot pain 08/31/2015  . Prediabetes 08/23/2015  . Hyperlipidemia 08/23/2015  . Depression  03/21/2015  . Acute esophagitis 02/09/2015  . Gastroesophageal reflux disease without esophagitis 08/27/2014  . Dysphagia, unspecified(787.20) 03/22/2014    Tiran Sauseda C. Amarii Bordas PT, DPT 02/23/20 12:34 PM   [facility] [address] [place], [place], [address] Phone: [phone]   Fax:  [phone]  Name: Stephanie Serrano MRN: [id number]  Date of Birth: 20-Dec-1965

## 2020-02-25 ENCOUNTER — Ambulatory Visit: Payer: Medicare Other | Admitting: Physical Therapy

## 2020-02-25 ENCOUNTER — Other Ambulatory Visit: Payer: Self-pay

## 2020-02-25 ENCOUNTER — Encounter: Payer: Self-pay | Admitting: Physical Therapy

## 2020-02-25 DIAGNOSIS — G8929 Other chronic pain: Secondary | ICD-10-CM

## 2020-02-25 DIAGNOSIS — M25562 Pain in left knee: Secondary | ICD-10-CM

## 2020-02-25 DIAGNOSIS — M6281 Muscle weakness (generalized): Secondary | ICD-10-CM

## 2020-02-25 NOTE — Therapy (Signed)
[place], [place], [address] Phone: [phone]   Fax:  [phone]  Physical Therapy Treatment  Patient Details  Name: Stephanie Serrano MRN: [id number] Date of Birth: 1966/07/26 Referring Provider (PT): Lowella Petties, MD   Encounter Date: 02/25/2020  PT End of Session - 02/25/20 1336    Visit Number  17    Number of Visits  22    Date for PT Re-Evaluation  03/11/20    Authorization Type  MCR/MCD    PT Start Time  1334    PT Stop Time  1414    PT Time Calculation (min)  40 min    Activity Tolerance  Patient tolerated treatment well    Behavior During Therapy  [employer] [facility] for tasks assessed/performed       Past Medical History:  Diagnosis Date  . Allergy   . Anemia   . Anxiety   . Arthritis   . Asthma   . Blood transfusion without reported diagnosis   . Depression   . Eczema   . Esophageal stricture   . GERD (gastroesophageal reflux disease)   . Migraines   . OCD (obsessive compulsive disorder)   . RLS (restless legs syndrome)   . Vertigo     Past Surgical History:  Procedure Laterality Date  . ESOPHAGOGASTRODUODENOSCOPY N/A 02/09/2015   Procedure: ESOPHAGOGASTRODUODENOSCOPY (EGD);  Surgeon: Inda Castle, MD;  [facility];  Service: Endoscopy;  Laterality: N/A;  with dilation  . FOOT SURGERY     bone spurs  . NASAL SINUS SURGERY    . PLANTAR FASCIA RELEASE Right 08/03/2016   Procedure: PLANTAR FASCIA RELEASE WITH BONE SPUR EXCISION;  Surgeon: Dorna Leitz, MD;  Location: [place];  Service: Orthopedics;  Laterality: Right;  . TONSILLECTOMY      There were no vitals filed for this visit.  Subjective Assessment - 02/25/20 1337    Subjective  Knee is a little achey today. Maybe a couple of spots that are painful in hip- just one spot that will not let up.    Patient Stated Goals  stability through hips, no pain with sitting, stairs (lives on 3rd floor)    Currently  in Pain?  Yes    Pain Location  Knee    Pain Orientation  Left    Pain Descriptors / Indicators  Aching                       [facility] Adult PT Treatment/Exercise - 02/25/20 0001      Knee/Hip Exercises: Stretches   Piriformis Stretch Limitations  supine figure 4      Knee/Hip Exercises: Supine   Bridges Limitations  with physioball roll in    Other Supine Knee/Hip Exercises  v-up handoffs red physioball      Knee/Hip Exercises: Prone   Other Prone Exercises  phsyioball plank rollouts      Manual Therapy   Manual therapy comments  skilled palpation and monitoring during TPDN    Soft tissue mobilization  Rt glut max, med/min       Trigger Point Dry Needling - 02/25/20 0001    Gluteus Maximus Response  Twitch response elicited;Palpable increased muscle length   Right            PT Short Term Goals - 01/14/20 6962      PT SHORT TERM GOAL #1   Title  pt will be independent in self correction utilizing  MET    Status  Achieved      PT SHORT TERM GOAL #2   Title  pt will report ability to correct posture and maintain equal pressure    Status  Achieved        PT Long Term Goals - 02/11/20 1427      PT LONG TERM GOAL #1   Title  gross LE strength to 5/5    Baseline  see flowsheet    Status  On-going      PT LONG TERM GOAL #2   Title  able to climb stairs to her apartment without knee pain    Baseline  achieved reported in past- yesterday had to go up and down a few times and it hurt at the end    Status  Achieved      PT LONG TERM GOAL #3   Title  pt will be able to maintain a seated position for at least 20 min comfortably    Baseline  usually lay down becuase the Right side of my hip starts to ache, improved to about 15 min      PT LONG TERM GOAL #4   Title  pt will be able to complete household chores without increase in knee pain    Status  Achieved      PT LONG TERM GOAL #5   Title  bil SLS to 15s and stable on stable surface to indicate  improved functional strength    Baseline  see flowsheet    Status  On-going    Target Date  03/11/20            Plan - 02/25/20 1423    Clinical Impression Statement  Today the patient complains of medial Lt. knee and a deep lower back pain. She showed improvements with Lt. knee pain when performing exercises that required her to contract her quads and hamstrings. Her back pain improved with dry needling. Twitch response resulted from TPDN and soreness followed as expected. She reports that she has been complying with her HEP, and reports a significant improvement in Rt. hip pain and less compensations (twisting) when walking. She still struggles with core exercises such as planks. The patient will benefit from skilled pt. in order to strengthen hip abductors, decrease hip and knee pain, and to strengthen core muscles.    PT Treatment/Interventions  ADLs/Self Care Home Management;Cryotherapy;Electrical Stimulation;Iontophoresis 32m/ml Dexamethasone;Stair training;Functional mobility training;Gait training;Ultrasound;Traction;Moist Heat;Therapeutic activities;Therapeutic exercise;Balance training;Neuromuscular re-education;Patient/family education;Passive range of motion;Manual techniques;Dry needling;Taping;Spinal Manipulations;Joint Manipulations    PT Next Visit Plan  Review hip abduction and core strengthening exercises       Patient will benefit from skilled therapeutic intervention in order to improve the following deficits and impairments:  Difficulty walking, Increased muscle spasms, Decreased activity tolerance, Pain, Improper body mechanics, Decreased balance, Decreased strength, Postural dysfunction  Visit Diagnosis: Chronic pain of left knee  Muscle weakness (generalized)     Problem List Patient Active Problem List   Diagnosis Date Noted  . Chest pain of uncertain etiology 103/50/0938 . Heart palpitations 09/05/2019  . Shortness of breath 09/05/2019  . Anxiety  09/05/2019  . Tachycardia 07/23/2019  . Sprain of right ankle 02/03/2019  . MDD (major depressive disorder), recurrent episode, moderate ([place] 06/28/2017  . OCD (obsessive compulsive disorder) 03/26/2017  . Obstructive sleep apnea of adult 01/18/2017  . Insomnia 02/08/2016  . Heel spur 09/01/2015  . Family history of cystitis 08/31/2015  . Left foot pain 08/31/2015  .  Prediabetes 08/23/2015  . Hyperlipidemia 08/23/2015  . Depression 03/21/2015  . Acute esophagitis 02/09/2015  . Gastroesophageal reflux disease without esophagitis 08/27/2014  . Dysphagia, unspecified(787.20) 03/22/2014   Charlyn Vialpando C. Randel Hargens PT, DPT 02/25/20 2:36 PM   [facility] [address] [place], [place], [address] Phone: [phone]   Fax:  [phone]  Name: Stephanie Serrano MRN: [id number] Date of Birth: 01/20/1966

## 2020-03-01 ENCOUNTER — Ambulatory Visit: Payer: Medicare Other | Admitting: Physical Therapy

## 2020-03-01 ENCOUNTER — Other Ambulatory Visit: Payer: Self-pay

## 2020-03-01 ENCOUNTER — Encounter: Payer: Self-pay | Admitting: Physical Therapy

## 2020-03-01 DIAGNOSIS — M25562 Pain in left knee: Secondary | ICD-10-CM | POA: Diagnosis not present

## 2020-03-01 DIAGNOSIS — G8929 Other chronic pain: Secondary | ICD-10-CM

## 2020-03-01 DIAGNOSIS — M6281 Muscle weakness (generalized): Secondary | ICD-10-CM

## 2020-03-01 NOTE — Therapy (Signed)
[place], [place], [address] Phone: [phone]   Fax:  [phone]  Physical Therapy Treatment  Patient Details  Name: Stephanie Serrano MRN: [id number] Date of Birth: Aug 14, 1966 Referring Provider (PT): Lowella Petties, MD   Encounter Date: 03/01/2020  PT End of Session - 03/01/20 1150    Visit Number  18    Number of Visits  22    Date for PT Re-Evaluation  03/11/20    Authorization Type  MCR/MCD    PT Start Time  1148    PT Stop Time  1230    PT Time Calculation (min)  42 min    Activity Tolerance  Patient tolerated treatment well    Behavior During Therapy  [facility] for tasks assessed/performed       Past Medical History:  Diagnosis Date  . Allergy   . Anemia   . Anxiety   . Arthritis   . Asthma   . Blood transfusion without reported diagnosis   . Depression   . Eczema   . Esophageal stricture   . GERD (gastroesophageal reflux disease)   . Migraines   . OCD (obsessive compulsive disorder)   . RLS (restless legs syndrome)   . Vertigo     Past Surgical History:  Procedure Laterality Date  . ESOPHAGOGASTRODUODENOSCOPY N/A 02/09/2015   Procedure: ESOPHAGOGASTRODUODENOSCOPY (EGD);  Surgeon: Inda Castle, MD;  Location: [facility];  Service: Endoscopy;  Laterality: N/A;  with dilation  . FOOT SURGERY     bone spurs  . NASAL SINUS SURGERY    . PLANTAR FASCIA RELEASE Right 08/03/2016   Procedure: PLANTAR FASCIA RELEASE WITH BONE SPUR EXCISION;  Surgeon: Dorna Leitz, MD;  Location: [place];  Service: Orthopedics;  Laterality: Right;  . TONSILLECTOMY      There were no vitals filed for this visit.  Subjective Assessment - 03/01/20 1152    Subjective  Yesterday I could feel the ache in my left SIJ that I haven't felt in a while. Did some stretching and am not sure what made it start aching.    Patient Stated Goals  stability through hips, no pain with sitting, stairs (lives  on 3rd floor)    Currently in Pain?  Yes    Pain Score  7     Pain Location  Hip   Rt SIJ   Pain Orientation  Right    Pain Descriptors / Indicators  Aching;Sore                       [facility] Adult PT Treatment/Exercise - 03/01/20 0001      Knee/Hip Exercises: Stretches   Piriformis Stretch Limitations  supine figure 4      Knee/Hip Exercises: Standing   Heel Raises Limitations  edge of step, quick lift/slow lower    Abduction Limitations  side stepping red tband around knees    Wall Squat Limitations  4x15s, ball bw knees    Other Standing Knee Exercises  lateral step ups with knee drive      Manual Therapy   Soft tissue mobilization  Rt piriformis release             PT Education - 03/01/20 1237    Education Details  see plan    Person(s) Educated  Patient    Methods  Explanation;Demonstration;Tactile cues;Verbal cues;Handout    Comprehension  Verbalized understanding;Returned demonstration;Verbal cues required;Tactile cues required;Need further instruction  PT Short Term Goals - 01/14/20 1941      PT SHORT TERM GOAL #1   Title  pt will be independent in self correction utilizing MET    Status  Achieved      PT SHORT TERM GOAL #2   Title  pt will report ability to correct posture and maintain equal pressure    Status  Achieved        PT Long Term Goals - 02/11/20 1427      PT LONG TERM GOAL #1   Title  gross LE strength to 5/5    Baseline  see flowsheet    Status  On-going      PT LONG TERM GOAL #2   Title  able to climb stairs to her apartment without knee pain    Baseline  achieved reported in past- yesterday had to go up and down a few times and it hurt at the end    Status  Achieved      PT LONG TERM GOAL #3   Title  pt will be able to maintain a seated position for at least 20 min comfortably    Baseline  usually lay down becuase the Right side of my hip starts to ache, improved to about 15 min      PT LONG TERM GOAL #4    Title  pt will be able to complete household chores without increase in knee pain    Status  Achieved      PT LONG TERM GOAL #5   Title  bil SLS to 15s and stable on stable surface to indicate improved functional strength    Baseline  see flowsheet    Status  On-going    Target Date  03/11/20            Plan - 03/01/20 1232    Clinical Impression Statement  Pt very worried that her pain in Rt buttock retunred and felt she had taken a step back. Discussed anatomy of her hip and how piriformis will "take over" and this should reduce as overall strength improves. Cues for form required for exercises today. Added more large-muscle group work into [employer] which was well tolerated.    PT Treatment/Interventions  ADLs/Self Care Home Management;Cryotherapy;Electrical Stimulation;Iontophoresis '4mg'$ /ml Dexamethasone;Stair training;Functional mobility training;Gait training;Ultrasound;Traction;Moist Heat;Therapeutic activities;Therapeutic exercise;Balance training;Neuromuscular re-education;Patient/family education;Passive range of motion;Manual techniques;Dry needling;Taping;Spinal Manipulations;Joint Manipulations    PT Next Visit Plan  take MMT    PT Home Exercise Plan  [facility]    Consulted and Agree with Plan of Care  Patient       Patient will benefit from skilled therapeutic intervention in order to improve the following deficits and impairments:  Difficulty walking, Increased muscle spasms, Decreased activity tolerance, Pain, Improper body mechanics, Decreased balance, Decreased strength, Postural dysfunction  Visit Diagnosis: Chronic pain of left knee  Muscle weakness (generalized)     Problem List Patient Active Problem List   Diagnosis Date Noted  . Chest pain of uncertain etiology 74/07/1447  . Heart palpitations 09/05/2019  . Shortness of breath 09/05/2019  . Anxiety 09/05/2019  . Tachycardia 07/23/2019  . Sprain of right ankle 02/03/2019  . MDD (major depressive disorder),  recurrent episode, moderate ([place]) 06/28/2017  . OCD (obsessive compulsive disorder) 03/26/2017  . Obstructive sleep apnea of adult 01/18/2017  . Insomnia 02/08/2016  . Heel spur 09/01/2015  . Family history of cystitis 08/31/2015  . Left foot pain 08/31/2015  . Prediabetes 08/23/2015  . Hyperlipidemia 08/23/2015  .  Depression 03/21/2015  . Acute esophagitis 02/09/2015  . Gastroesophageal reflux disease without esophagitis 08/27/2014  . Dysphagia, unspecified(787.20) 03/22/2014    Chirag Krueger C. Lennox Leikam PT, DPT 03/01/20 12:38 PM   [facility] [address] [place], [place], [address] Phone: [phone]   Fax:  [phone]  Name: Stephanie Serrano MRN: [id number] Date of Birth: 10/08/1966

## 2020-03-03 ENCOUNTER — Other Ambulatory Visit: Payer: Self-pay

## 2020-03-03 ENCOUNTER — Ambulatory Visit: Payer: Medicare Other | Admitting: Physical Therapy

## 2020-03-03 ENCOUNTER — Encounter: Payer: Self-pay | Admitting: Physical Therapy

## 2020-03-03 DIAGNOSIS — M25562 Pain in left knee: Secondary | ICD-10-CM

## 2020-03-03 DIAGNOSIS — G8929 Other chronic pain: Secondary | ICD-10-CM

## 2020-03-03 DIAGNOSIS — M6281 Muscle weakness (generalized): Secondary | ICD-10-CM

## 2020-03-03 NOTE — Therapy (Addendum)
[place], [place], [address] Phone: [phone]   Fax:  [phone]  Physical Therapy Treatment Progress Note Reporting Period 12/14/2019 to 03/03/2020  See note below for Objective Data and Assessment of Progress/Goals.       Patient Details  Name: Stephanie Serrano MRN: [id number] Date of Birth: 1966-01-30 Referring Provider (PT): Lowella Petties, MD   Encounter Date: 03/03/2020  PT End of Session - 03/03/20 1551    Visit Number  19    Number of Visits  22    Date for PT Re-Evaluation  03/11/20    Authorization Type  MCR/MCD    PT Start Time  1547    PT Stop Time  1630    PT Time Calculation (min)  43 min    Activity Tolerance  Patient tolerated treatment well    Behavior During Therapy  [facility] for tasks assessed/performed       Past Medical History:  Diagnosis Date  . Allergy   . Anemia   . Anxiety   . Arthritis   . Asthma   . Blood transfusion without reported diagnosis   . Depression   . Eczema   . Esophageal stricture   . GERD (gastroesophageal reflux disease)   . Migraines   . OCD (obsessive compulsive disorder)   . RLS (restless legs syndrome)   . Vertigo     Past Surgical History:  Procedure Laterality Date  . ESOPHAGOGASTRODUODENOSCOPY N/A 02/09/2015   Procedure: ESOPHAGOGASTRODUODENOSCOPY (EGD);  Surgeon: Inda Castle, MD;  Location: [facility];  Service: Endoscopy;  Laterality: N/A;  with dilation  . FOOT SURGERY     bone spurs  . NASAL SINUS SURGERY    . PLANTAR FASCIA RELEASE Right 08/03/2016   Procedure: PLANTAR FASCIA RELEASE WITH BONE SPUR EXCISION;  Surgeon: Dorna Leitz, MD;  Location: [place];  Service: Orthopedics;  Laterality: Right;  . TONSILLECTOMY      There were no vitals filed for this visit.  Subjective Assessment - 03/03/20 1550    Subjective  Just still very tight in that one spot (points to Rt of Rt SIJ)    Currently in Pain?  Yes     Pain Score  7     Pain Location  Hip    Pain Orientation  Right    Pain Descriptors / Indicators  Tightness    Aggravating Factors   just gets tight    Pain Relieving Factors  stretches         [facility] PT Assessment - 03/03/20 0001      Assessment   Medical Diagnosis  Lt knee pain    Referring Provider (PT)  Lowella Petties, MD      Strength   Right Hip Flexion  4/5    Right Hip Extension  5/5    Right Hip ABduction  4+/5    Left Hip Flexion  4/5    Left Hip Extension  5/5    Left Hip ABduction  4+/5      Ambulation/Gait   Gait Comments  no trendelenburg noted in gait with insert of 2-layer heel lift                   [facility] Adult PT Treatment/Exercise - 03/03/20 0001      Knee/Hip Exercises: Stretches   Piriformis Stretch Limitations  supine figure 4    Other Knee/Hip Stretches  cat camel child pose  Knee/Hip Exercises: Standing   Hip Flexion Limitations  hip hinge dead lift blue tband    Other Standing Knee Exercises  sumo squat      Knee/Hip Exercises: Supine   Bridges with Clamshell  Both;3 sets;15 reps   blue tband, wide stance     Knee/Hip Exercises: Sidelying   Hip ABduction Limitations  hip burner- circles, arcs, flx/ext, clam/reverse             PT Education - 03/03/20 1558    Education Details  heel lift    Person(s) Educated  Patient    Methods  Explanation    Comprehension  Verbalized understanding       PT Short Term Goals - 01/14/20 0938      PT SHORT TERM GOAL #1   Title  pt will be independent in self correction utilizing MET    Status  Achieved      PT SHORT TERM GOAL #2   Title  pt will report ability to correct posture and maintain equal pressure    Status  Achieved        PT Long Term Goals - 02/11/20 1427      PT LONG TERM GOAL #1   Title  gross LE strength to 5/5    Baseline  see flowsheet    Status  On-going      PT LONG TERM GOAL #2   Title  able to climb stairs to her apartment without knee pain     Baseline  achieved reported in past- yesterday had to go up and down a few times and it hurt at the end    Status  Achieved      PT LONG TERM GOAL #3   Title  pt will be able to maintain a seated position for at least 20 min comfortably    Baseline  usually lay down becuase the Right side of my hip starts to ache, improved to about 15 min      PT LONG TERM GOAL #4   Title  pt will be able to complete household chores without increase in knee pain    Status  Achieved      PT LONG TERM GOAL #5   Title  bil SLS to 15s and stable on stable surface to indicate improved functional strength    Baseline  see flowsheet    Status  On-going    Target Date  03/11/20            Plan - 03/03/20 1635    Clinical Impression Statement  Added 2 layer heel lift to Rt shoe today which decreased her pain after a few minutes of walking. Hip abductor strength has improved but had some diffuclty with hip flexion today due to reported cramping in QLs. Cues for core + gluts increased reported work in hip hinge exercise.    PT Treatment/Interventions  ADLs/Self Care Home Management;Cryotherapy;Electrical Stimulation;Iontophoresis 63m/ml Dexamethasone;Stair training;Functional mobility training;Gait training;Ultrasound;Traction;Moist Heat;Therapeutic activities;Therapeutic exercise;Balance training;Neuromuscular re-education;Patient/family education;Passive range of motion;Manual techniques;Dry needling;Taping;Spinal Manipulations;Joint Manipulations    [facility]  [facility]   Consulted and Agree with Plan of Care  Patient       Patient will benefit from skilled therapeutic intervention in order to improve the following deficits and impairments:  Difficulty walking, Increased muscle spasms, Decreased activity tolerance, Pain, Improper body mechanics, Decreased balance, Decreased strength, Postural dysfunction  Visit Diagnosis: Chronic pain of left knee  Muscle weakness  (generalized)  Problem List Patient Active Problem List   Diagnosis Date Noted  . Chest pain of uncertain etiology 64/68/0321  . Heart palpitations 09/05/2019  . Shortness of breath 09/05/2019  . Anxiety 09/05/2019  . Tachycardia 07/23/2019  . Sprain of right ankle 02/03/2019  . MDD (major depressive disorder), recurrent episode, moderate ([place]) 06/28/2017  . OCD (obsessive compulsive disorder) 03/26/2017  . Obstructive sleep apnea of adult 01/18/2017  . Insomnia 02/08/2016  . Heel spur 09/01/2015  . Family history of cystitis 08/31/2015  . Left foot pain 08/31/2015  . Prediabetes 08/23/2015  . Hyperlipidemia 08/23/2015  . Depression 03/21/2015  . Acute esophagitis 02/09/2015  . Gastroesophageal reflux disease without esophagitis 08/27/2014  . Dysphagia, unspecified(787.20) 03/22/2014   Lola Czerwonka C. Adriauna Campton PT, DPT 03/03/20 4:39 PM   [facility] [address] [place], [place], [address] Phone: [phone]   Fax:  [phone]  Name: Stephanie Serrano MRN: [id number] Date of Birth: 01-Feb-1966

## 2020-03-07 ENCOUNTER — Other Ambulatory Visit: Payer: Self-pay

## 2020-03-07 ENCOUNTER — Encounter: Payer: Self-pay | Admitting: Physical Therapy

## 2020-03-07 ENCOUNTER — Ambulatory Visit: Payer: Medicare Other | Admitting: Physical Therapy

## 2020-03-07 DIAGNOSIS — M25562 Pain in left knee: Secondary | ICD-10-CM

## 2020-03-07 DIAGNOSIS — G8929 Other chronic pain: Secondary | ICD-10-CM

## 2020-03-07 DIAGNOSIS — M6281 Muscle weakness (generalized): Secondary | ICD-10-CM

## 2020-03-07 NOTE — Therapy (Signed)
[place], [place], [address] Phone: [phone]   Fax:  [phone]  Physical Therapy Treatment  Patient Details  Name: Stephanie Serrano MRN: [id number] Date of Birth: 1966/04/27 Referring Provider (PT): Lowella Petties, MD   Encounter Date: 03/07/2020  PT End of Session - 03/07/20 1420    Visit Number  20    Number of Visits  22    Date for PT Re-Evaluation  03/11/20    Authorization Type  MCR/MCD    PT Start Time  1417    PT Stop Time  1455    PT Time Calculation (Stephanie)  38 Stephanie    Activity Tolerance  Patient tolerated treatment well    Behavior During Therapy  [facility] for tasks assessed/performed       Past Medical History:  Diagnosis Date  . Allergy   . Anemia   . Anxiety   . Arthritis   . Asthma   . Blood transfusion without reported diagnosis   . Depression   . Eczema   . Esophageal stricture   . GERD (gastroesophageal reflux disease)   . Migraines   . OCD (obsessive compulsive disorder)   . RLS (restless legs syndrome)   . Vertigo     Past Surgical History:  Procedure Laterality Date  . ESOPHAGOGASTRODUODENOSCOPY N/A 02/09/2015   Procedure: ESOPHAGOGASTRODUODENOSCOPY (EGD);  Surgeon: Inda Castle, MD;  Location: [facility];  Service: Endoscopy;  Laterality: N/A;  with dilation  . FOOT SURGERY     bone spurs  . NASAL SINUS SURGERY    . PLANTAR FASCIA RELEASE Right 08/03/2016   Procedure: PLANTAR FASCIA RELEASE WITH BONE SPUR EXCISION;  Surgeon: Dorna Leitz, MD;  Location: [facility];  Service: Orthopedics;  Laterality: Right;  . TONSILLECTOMY      There were no vitals filed for this visit.  Subjective Assessment - 03/07/20 1420    Subjective  I am wearing heel lift consistently. Felt muscles adjusting but it felt different than prior pain.    Currently in Pain?  Yes    Pain Score  5     Pain Location  --   SIJ   Pain Orientation  Right    Pain Descriptors /  Indicators  Aching    Aggravating Factors   no known particular factors    Pain Relieving Factors  stretches    Pain Score  0    Pain Location  Knee    Pain Orientation  Left         [facility] PT Assessment - 03/07/20 0001      Assessment   Medical Diagnosis  Lt knee pain    Referring Provider (PT)  Lowella Petties, MD                   [facility] Adult PT Treatment/Exercise - 03/07/20 0001      Knee/Hip Exercises: Stretches   Passive Hamstring Stretch Limitations  supine with strap    Piriformis Stretch Limitations  seated figure 4 feet on table    Gastroc Stretch Limitations  slant boards    Other Knee/Hip Stretches  open book with top leg extended      Knee/Hip Exercises: Aerobic   Stationary Bike  L4 4 Stephanie      Manual Therapy   Joint Mobilization  bil SIJ gr4 5x30s each             PT Education - 03/07/20 1455  Education Details  heel lift, progressing stretches, joint mobility, poc    Person(s) Educated  Patient    Methods  Explanation    Comprehension  Verbalized understanding;Need further instruction       PT Short Term Goals - 01/14/20 4035      PT SHORT TERM GOAL #1   Title  pt will be independent in self correction utilizing MET    Status  Achieved      PT SHORT TERM GOAL #2   Title  pt will report ability to correct posture and maintain equal pressure    Status  Achieved        PT Long Term Goals - 02/11/20 1427      PT LONG TERM GOAL #1   Title  gross LE strength to 5/5    Baseline  see flowsheet    Status  On-going      PT LONG TERM GOAL #2   Title  able to climb stairs to her apartment without knee pain    Baseline  achieved reported in past- yesterday had to go up and down a few times and it hurt at the end    Status  Achieved      PT LONG TERM GOAL #3   Title  pt will be able to maintain a seated position for at least 20 Stephanie comfortably    Baseline  usually lay down becuase the Right side of my hip starts to ache, improved  to about 15 Stephanie      PT LONG TERM GOAL #4   Title  pt will be able to complete household chores without increase in knee pain    Status  Achieved      PT LONG TERM GOAL #5   Title  bil SLS to 15s and stable on stable surface to indicate improved functional strength    Baseline  see flowsheet    Status  On-going    Target Date  03/11/20            Plan - 03/07/20 1456    Clinical Impression Statement  Pt reported feeling that concordant pain was being reduced during grade 4 mobilizations. No tightness noted in surrounding muscualature. will d/c at next visit and return to MD if SIJ pain returns.    PT Treatment/Interventions  ADLs/Self Care Home Management;Cryotherapy;Electrical Stimulation;Iontophoresis 62m/ml Dexamethasone;Stair training;Functional mobility training;Gait training;Ultrasound;Traction;Moist Heat;Therapeutic activities;Therapeutic exercise;Balance training;Neuromuscular re-education;Patient/family education;Passive range of motion;Manual techniques;Dry needling;Taping;Spinal Manipulations;Joint Manipulations    PT Next Visit Plan  d/c    [facility]   Consulted and Agree with Plan of Care  Patient       Patient will benefit from skilled therapeutic intervention in order to improve the following deficits and impairments:  Difficulty walking, Increased muscle spasms, Decreased activity tolerance, Pain, Improper body mechanics, Decreased balance, Decreased strength, Postural dysfunction  Visit Diagnosis: Chronic pain of left knee  Muscle weakness (generalized)     Problem List Patient Active Problem List   Diagnosis Date Noted  . Chest pain of uncertain etiology 124/81/8590 . Heart palpitations 09/05/2019  . Shortness of breath 09/05/2019  . Anxiety 09/05/2019  . Tachycardia 07/23/2019  . Sprain of right ankle 02/03/2019  . MDD (major depressive disorder), recurrent episode, moderate ([place] 06/28/2017  . OCD (obsessive compulsive  disorder) 03/26/2017  . Obstructive sleep apnea of adult 01/18/2017  . Insomnia 02/08/2016  . Heel spur 09/01/2015  . Family history of cystitis 08/31/2015  .  Left foot pain 08/31/2015  . Prediabetes 08/23/2015  . Hyperlipidemia 08/23/2015  . Depression 03/21/2015  . Acute esophagitis 02/09/2015  . Gastroesophageal reflux disease without esophagitis 08/27/2014  . Dysphagia, unspecified(787.20) 03/22/2014    Saylah Ketner C. Starlena Beil PT, DPT 03/07/20 2:59 PM   [facility] [address] [place], [place], [address] Phone: [phone]   Fax:  [phone]  Name: Stephanie Serrano MRN: [id number] Date of Birth: [birth year]

## 2020-03-09 ENCOUNTER — Other Ambulatory Visit: Payer: Self-pay

## 2020-03-09 ENCOUNTER — Ambulatory Visit: Payer: Medicare Other | Admitting: Physical Therapy

## 2020-03-09 ENCOUNTER — Encounter: Payer: Self-pay | Admitting: Physical Therapy

## 2020-03-09 DIAGNOSIS — M25562 Pain in left knee: Secondary | ICD-10-CM

## 2020-03-09 DIAGNOSIS — M6281 Muscle weakness (generalized): Secondary | ICD-10-CM

## 2020-03-09 DIAGNOSIS — G8929 Other chronic pain: Secondary | ICD-10-CM

## 2020-03-09 IMAGING — CT CT RENAL STONE PROTOCOL
2 of 4 series · 16 of 46 positions shown, 18 images · non-contrast
Comparison: 09/05/2019

CLINICAL DATA: Low back pain, left lower quadrant and left flank
pain.

EXAM:
CT ABDOMEN AND PELVIS WITHOUT CONTRAST
TECHNIQUE: Multidetector CT imaging of the abdomen and pelvis was performed
following the standard protocol without IV contrast.

[Series 2: axial st · axial · 0.73mm/px · z∈[-424,-49]mm · 13 of 85 slices shown, 15 images]
[im 5/85  soft-tissue]
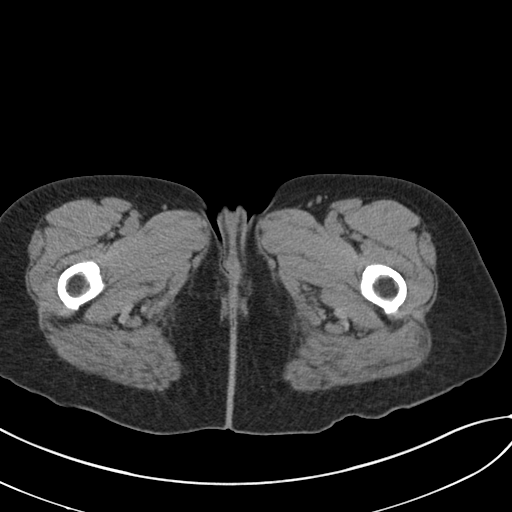
[im 5/85  bone]
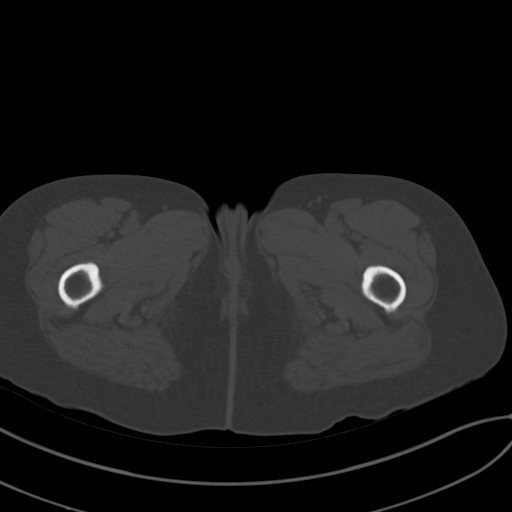
[im 10/85  soft-tissue]
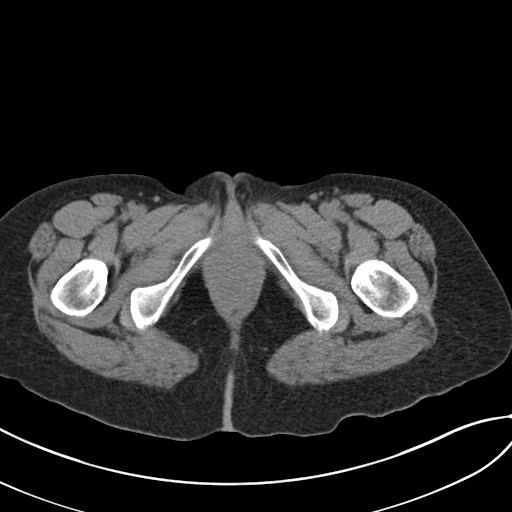
[im 19/85  soft-tissue]
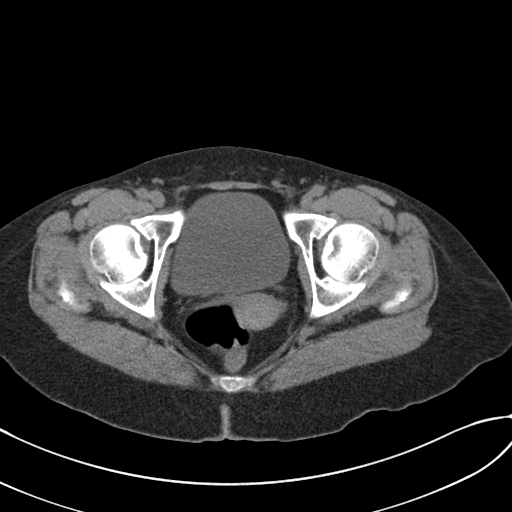
[im 24/85  soft-tissue]
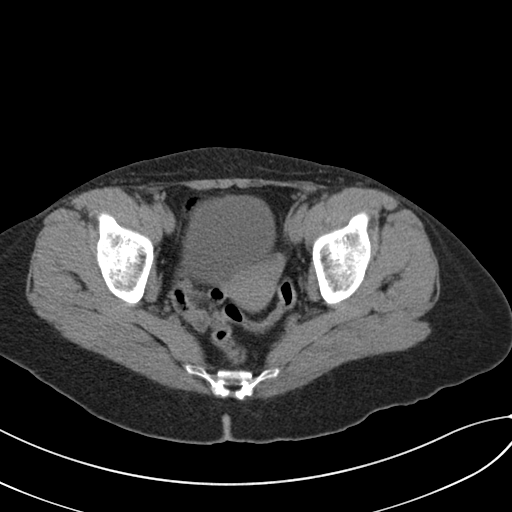
[im 29/85  soft-tissue]
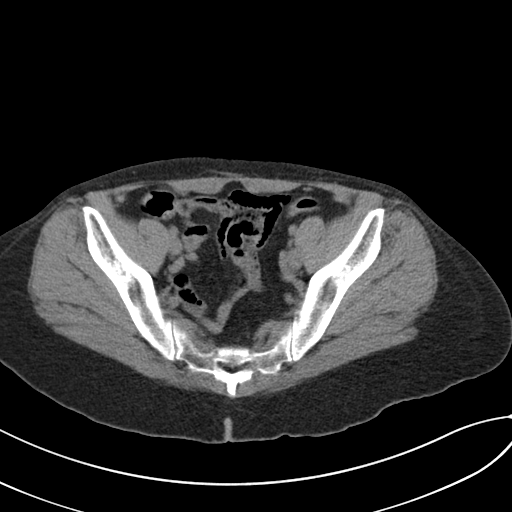
[im 38/85  soft-tissue]
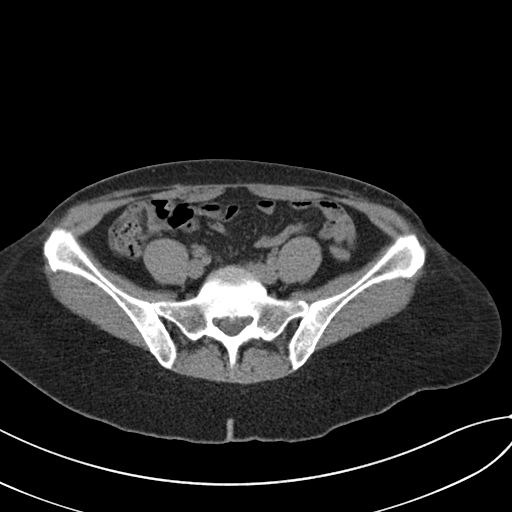
[im 43/85  soft-tissue]
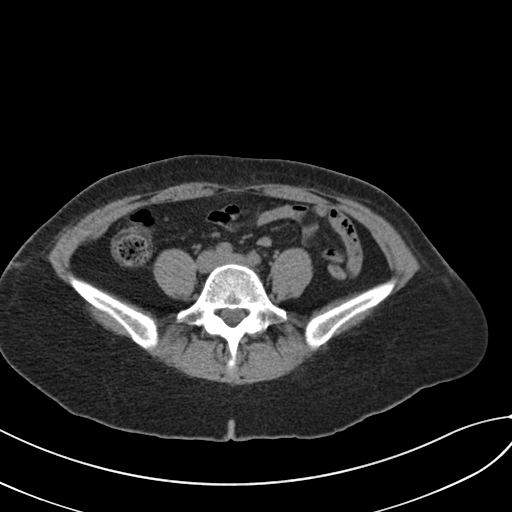
[im 47/85  soft-tissue]
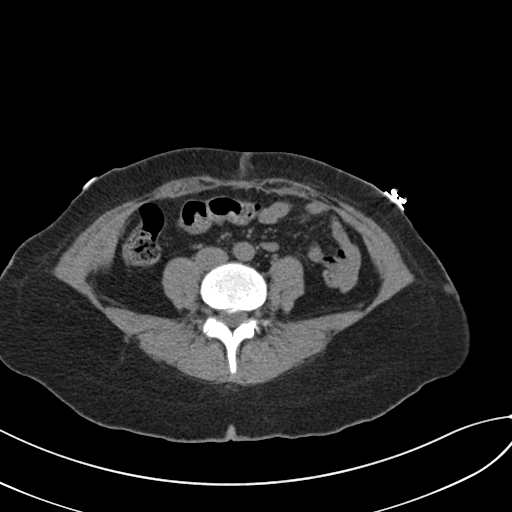
[im 57/85  soft-tissue]
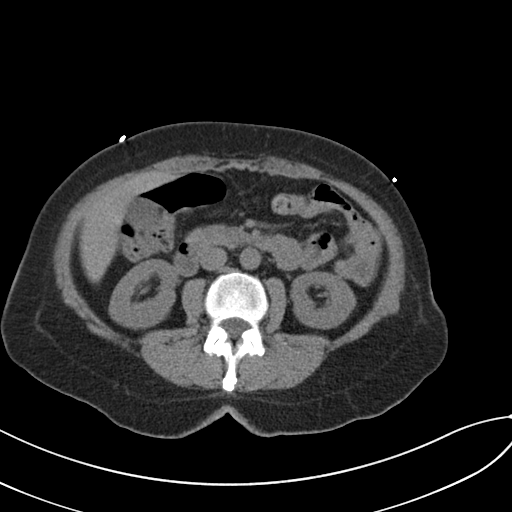
[im 57/85  bone]
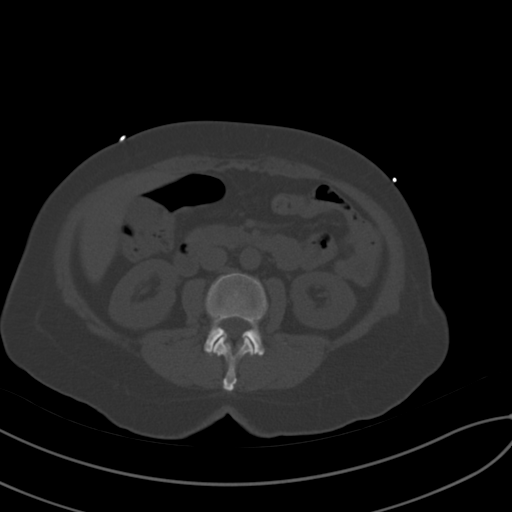
[im 61/85  soft-tissue]
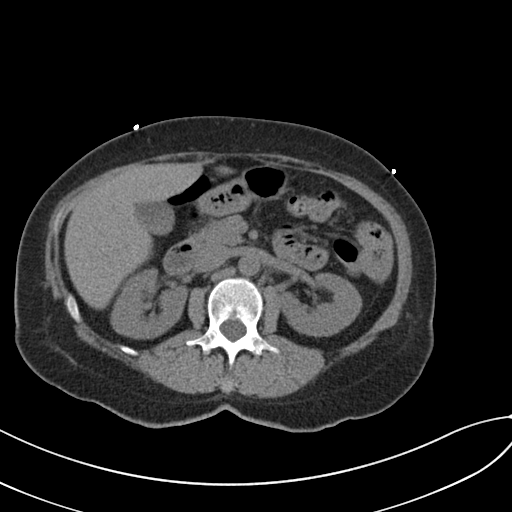
[im 66/85  soft-tissue]
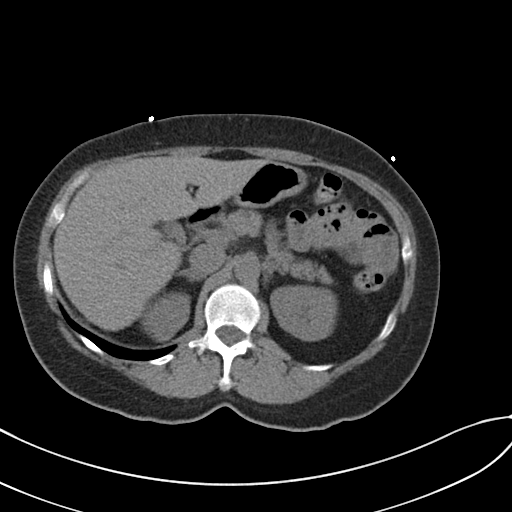
[im 75/85  soft-tissue]
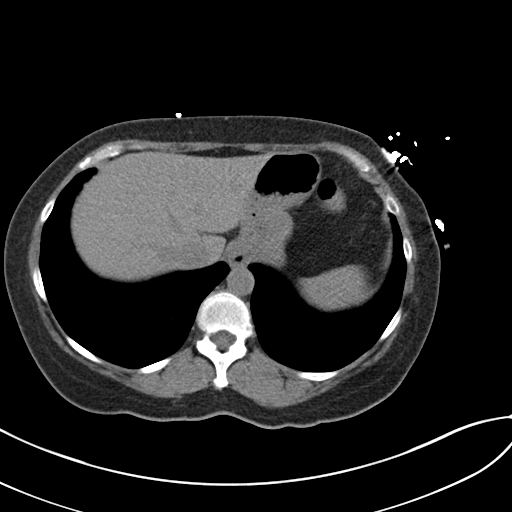
[im 80/85  soft-tissue]
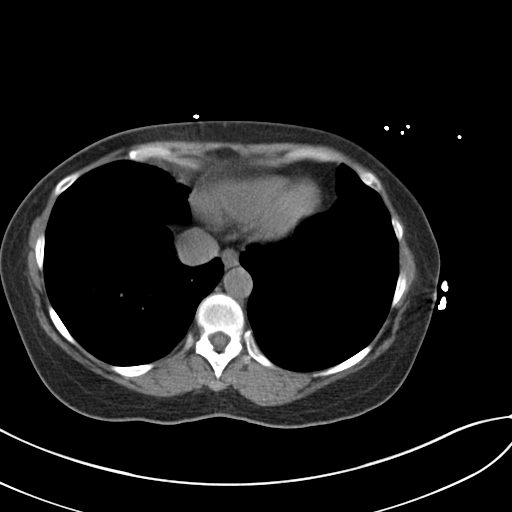

[Series 5: coronal · coronal · 0.66mm/px · 3 of 114 slices shown]
[im 38/114  soft-tissue]
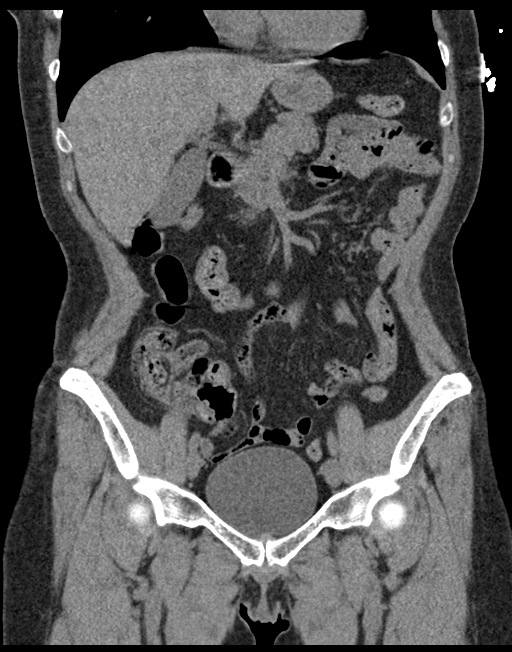
[im 51/114  soft-tissue]
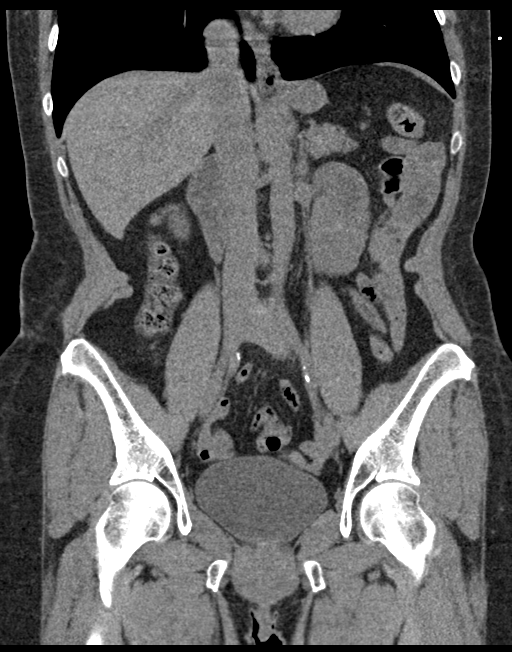
[im 63/114  soft-tissue]
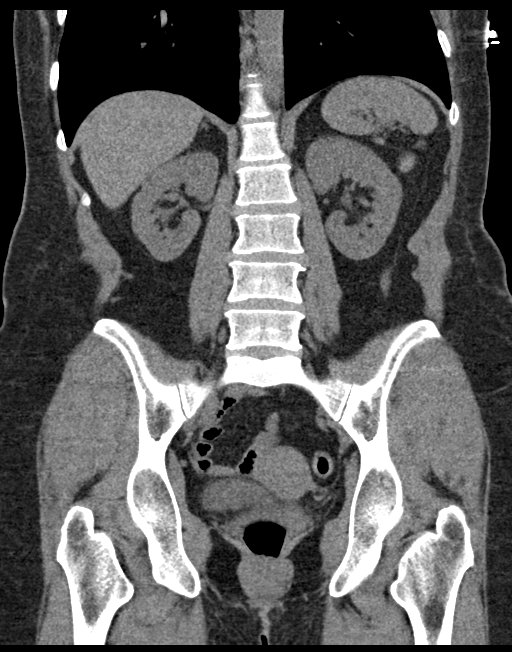

[16 of 46 positions shown; findings below may reference images not displayed]

FINDINGS: Lower chest: Lung bases are clear. No effusions. Heart is normal
size.

Hepatobiliary: No focal hepatic abnormality. Gallbladder
unremarkable.

Pancreas: No focal abnormality or ductal dilatation.

Spleen: No focal abnormality.  Normal size.

Adrenals/Urinary Tract: No adrenal abnormality. No focal renal
abnormality. No stones or hydronephrosis. Urinary bladder is
unremarkable.

Stomach/Bowel: Normal appendix. Stomach, large and small bowel
grossly unremarkable.

Vascular/Lymphatic: No evidence of aneurysm or adenopathy.

Reproductive: Uterus and adnexa unremarkable.  No mass.

Other: No free fluid or free air.

Musculoskeletal: No acute bony abnormality.
IMPRESSION: No renal or ureteral stones.  No hydronephrosis.

No acute findings in the abdomen or pelvis.

## 2020-03-09 NOTE — Therapy (Signed)
[place], [place], [address] Phone: [phone]   Fax:  [phone]  Physical Therapy Treatment/Discharge  Patient Details  Name: Stephanie Serrano MRN: [id number] Date of Birth: [birth year] Referring Provider (PT): Lowella Petties, MD   Encounter Date: 03/09/2020  PT End of Session - 03/09/20 1459    Visit Number  21    Number of Visits  22    Date for PT Re-Evaluation  03/11/20    Authorization Type  MCR/MCD    PT Start Time  1419    PT Stop Time  1508    PT Time Calculation (min)  49 min    Activity Tolerance  Patient tolerated treatment well    Behavior During Therapy  [facility] for tasks assessed/performed       Past Medical History:  Diagnosis Date  . Allergy   . Anemia   . Anxiety   . Arthritis   . Asthma   . Blood transfusion without reported diagnosis   . Depression   . Eczema   . Esophageal stricture   . GERD (gastroesophageal reflux disease)   . Migraines   . OCD (obsessive compulsive disorder)   . RLS (restless legs syndrome)   . Vertigo     Past Surgical History:  Procedure Laterality Date  . ESOPHAGOGASTRODUODENOSCOPY N/A 02/09/2015   Procedure: ESOPHAGOGASTRODUODENOSCOPY (EGD);  Surgeon: Inda Castle, MD;  Location: [facility];  Service: Endoscopy;  Laterality: N/A;  with dilation  . FOOT SURGERY     bone spurs  . NASAL SINUS SURGERY    . PLANTAR FASCIA RELEASE Right 08/03/2016   Procedure: PLANTAR FASCIA RELEASE WITH BONE SPUR EXCISION;  Surgeon: Dorna Leitz, MD;  Location: [facility];  Service: Orthopedics;  Laterality: Right;  . TONSILLECTOMY      There were no vitals filed for this visit.  Subjective Assessment - 03/09/20 1423    Subjective  I am just in so much pain in that one spot. Knee is not hurting.    Patient Stated Goals  stability through hips, no pain with sitting, stairs (lives on 3rd floor)    Currently in Pain?  Yes    Pain Score  8      Pain Location  --   SIJ   Pain Orientation  Right    Pain Descriptors / Indicators  Sharp    Pain Frequency  Constant    Aggravating Factors   no known particular causes    Pain Relieving Factors  tylenol, stretches    Pain Score  0    Pain Location  Knee    Pain Orientation  Left         [facility] PT Assessment - 03/09/20 0001      Assessment   Medical Diagnosis  Lt knee pain    Referring Provider (PT)  Lowella Petties, MD    [place]    Additional Comments  3rd floor apartment      Prior Function   Level of Independence  Independent    Vocation Requirements  not working right now      Cognition   Overall Cognitive Status  Within Functional Limits for tasks assessed      Observation/Other Assessments   Focus on Therapeutic Outcomes (FOTO)   39% limited      Sensation  Additional Comments  WFL      Posture/Postural Control   Posture Comments  WFL      Strength   Overall Strength Comments  gross 5/5      Palpation   Palpation comment  TTP of Rt SIJ      Ambulation/Gait   Gait Comments  not wearing lift today- demo level pelvis in gait      High Level Balance   High Level Balance Comments  bil SLS steady to 30s                   [facility] Adult PT Treatment/Exercise - 03/09/20 0001      Knee/Hip Exercises: Stretches   Piriformis Stretch Limitations  supine figure 4      Knee/Hip Exercises: Aerobic   Stationary Bike  5 min L4      Moist Heat Therapy   Number Minutes Moist Heat  10 Minutes    Moist Heat Location  Lumbar Spine      Manual Therapy   Manual therapy comments  skilled palpation and monitoring during TPDN    Soft tissue mobilization  Rt superior aspect of glut max near SIJ       Trigger Point Dry Needling - 03/09/20 0001    Gluteus Maximus Response  Twitch response elicited;Palpable increased muscle length   right           PT Education - 03/09/20 1503    Education Details  FOTO, goals, SIJ    Person(s) Educated  Patient    Methods  Explanation    Comprehension  Verbalized understanding       PT Short Term Goals - 01/14/20 2094      PT SHORT TERM GOAL #1   Title  pt will be independent in self correction utilizing MET    Status  Achieved      PT SHORT TERM GOAL #2   Title  pt will report ability to correct posture and maintain equal pressure    Status  Achieved        PT Long Term Goals - 03/09/20 1426      PT LONG TERM GOAL #1   Title  gross LE strength to 5/5    Baseline  see flowsheet    Status  Achieved      PT LONG TERM GOAL #2   Title  able to climb stairs to her apartment without knee pain    Status  Achieved      PT LONG TERM GOAL #3   Title  pt will be able to maintain a seated position for at least 20 min comfortably    Baseline  unable due to Rt SIJ    Status  Not Met      PT LONG TERM GOAL #4   Title  pt will be able to complete household chores without increase in knee pain    Status  Achieved      PT LONG TERM GOAL #5   Title  bil SLS to 15s and stable on stable surface to indicate improved functional strength    Status  Achieved            Plan - 03/09/20 1459    Clinical Impression Statement  pt has met all of her goals in regards to her left knee. pain has centralized to Rt SIJ which continues to cause glut max spasm. DN utilized today to mitigate symptoms and was successful. I encouraged her to  form a relationship with a therapist that she can obtain DN from on request basis and return if she needs another episode of PT. I would like for her to return to her physician to discuss options to treat SIJ pain- consider imaging or injections.    PT Treatment/Interventions  ADLs/Self Care Home Management;Cryotherapy;Electrical Stimulation;Iontophoresis 20m/ml Dexamethasone;Stair training;Functional mobility training;Gait training;Ultrasound;Traction;Moist  Heat;Therapeutic activities;Therapeutic exercise;Balance training;Neuromuscular re-education;Patient/family education;Passive range of motion;Manual techniques;Dry needling;Taping;Spinal Manipulations;Joint Manipulations    PT Home Exercise Plan  [facility]   Consulted and Agree with Plan of Care  Patient       Patient will benefit from skilled therapeutic intervention in order to improve the following deficits and impairments:  Difficulty walking, Increased muscle spasms, Decreased activity tolerance, Pain, Improper body mechanics, Decreased balance, Decreased strength, Postural dysfunction  Visit Diagnosis: Chronic pain of left knee  Muscle weakness (generalized)     Problem List Patient Active Problem List   Diagnosis Date Noted  . Chest pain of uncertain etiology 192/90/9030 . Heart palpitations 09/05/2019  . Shortness of breath 09/05/2019  . Anxiety 09/05/2019  . Tachycardia 07/23/2019  . Sprain of right ankle 02/03/2019  . MDD (major depressive disorder), [place] episode, moderate [place] 06/28/2017  . OCD (obsessive compulsive disorder) 03/26/2017  . Obstructive sleep apnea of adult 01/18/2017  . Insomnia 02/08/2016  . Heel spur 09/01/2015  . Family history of cystitis 08/31/2015  . Left foot pain 08/31/2015  . Prediabetes 08/23/2015  . Hyperlipidemia 08/23/2015  . Depression 03/21/2015  . Acute esophagitis 02/09/2015  . Gastroesophageal reflux disease without esophagitis 08/27/2014  . Dysphagia, unspecified(787.20) 03/22/2014    Melek Pownall C. Taraji Mungo PT, DPT 03/09/20 3:04 PM  PHYSICAL THERAPY DISCHARGE SUMMARY  Visits from Start of Care: 21  Current functional level related to goals / functional outcomes: See above   Remaining deficits: See above   Education / Equipment: Anatomy of condition, POC, HEP, exercise form/rationale  Plan: Patient agrees to discharge.  Patient goals were partially met. Patient is being discharged due to meeting the stated rehab  goals.  ?????      [place] [address]Phone: [phone]  Fax:  [phone] Name: Stephanie ProbertMRN: [id number] of Birth: 1Nov 27, 1967

## 2020-04-04 DIAGNOSIS — M545 Low back pain: Secondary | ICD-10-CM | POA: Diagnosis not present

## 2020-04-04 DIAGNOSIS — H9313 Tinnitus, bilateral: Secondary | ICD-10-CM | POA: Diagnosis not present

## 2020-04-04 DIAGNOSIS — R195 Other fecal abnormalities: Secondary | ICD-10-CM | POA: Diagnosis not present

## 2020-04-08 DIAGNOSIS — R1310 Dysphagia, unspecified: Secondary | ICD-10-CM | POA: Diagnosis not present

## 2020-04-08 DIAGNOSIS — K200 Eosinophilic esophagitis: Secondary | ICD-10-CM | POA: Diagnosis not present

## 2020-04-08 DIAGNOSIS — K529 Noninfective gastroenteritis and colitis, unspecified: Secondary | ICD-10-CM | POA: Diagnosis not present

## 2020-04-18 DIAGNOSIS — R42 Dizziness and giddiness: Secondary | ICD-10-CM | POA: Diagnosis not present

## 2020-04-18 DIAGNOSIS — H9313 Tinnitus, bilateral: Secondary | ICD-10-CM | POA: Diagnosis not present

## 2020-05-12 DIAGNOSIS — Z03818 Encounter for observation for suspected exposure to other biological agents ruled out: Secondary | ICD-10-CM | POA: Diagnosis not present

## 2020-05-17 DIAGNOSIS — K200 Eosinophilic esophagitis: Secondary | ICD-10-CM | POA: Diagnosis not present

## 2020-05-17 DIAGNOSIS — K2930 Chronic superficial gastritis without bleeding: Secondary | ICD-10-CM | POA: Diagnosis not present

## 2020-05-17 DIAGNOSIS — R197 Diarrhea, unspecified: Secondary | ICD-10-CM | POA: Diagnosis not present

## 2020-05-17 DIAGNOSIS — R1310 Dysphagia, unspecified: Secondary | ICD-10-CM | POA: Diagnosis not present

## 2020-05-17 DIAGNOSIS — K621 Rectal polyp: Secondary | ICD-10-CM | POA: Diagnosis not present

## 2020-05-23 DIAGNOSIS — K2930 Chronic superficial gastritis without bleeding: Secondary | ICD-10-CM | POA: Diagnosis not present

## 2020-05-23 DIAGNOSIS — K200 Eosinophilic esophagitis: Secondary | ICD-10-CM | POA: Diagnosis not present

## 2020-05-23 DIAGNOSIS — K621 Rectal polyp: Secondary | ICD-10-CM | POA: Diagnosis not present

## 2020-06-09 DIAGNOSIS — L2089 Other atopic dermatitis: Secondary | ICD-10-CM | POA: Diagnosis not present

## 2020-06-17 DIAGNOSIS — L2089 Other atopic dermatitis: Secondary | ICD-10-CM | POA: Diagnosis not present

## 2020-06-24 DIAGNOSIS — L200 Besnier's prurigo: Secondary | ICD-10-CM | POA: Diagnosis not present

## 2020-07-05 DIAGNOSIS — M545 Low back pain: Secondary | ICD-10-CM | POA: Diagnosis not present

## 2020-07-06 DIAGNOSIS — M533 Sacrococcygeal disorders, not elsewhere classified: Secondary | ICD-10-CM | POA: Diagnosis not present

## 2020-07-27 DIAGNOSIS — M533 Sacrococcygeal disorders, not elsewhere classified: Secondary | ICD-10-CM | POA: Diagnosis not present

## 2020-08-12 ENCOUNTER — Ambulatory Visit: Payer: Medicare HMO | Attending: Family Medicine | Admitting: Physical Therapy

## 2020-08-12 ENCOUNTER — Encounter: Payer: Self-pay | Admitting: Physical Therapy

## 2020-08-12 ENCOUNTER — Other Ambulatory Visit: Payer: Self-pay

## 2020-08-12 DIAGNOSIS — M25562 Pain in left knee: Secondary | ICD-10-CM | POA: Diagnosis not present

## 2020-08-12 DIAGNOSIS — M6281 Muscle weakness (generalized): Secondary | ICD-10-CM | POA: Insufficient documentation

## 2020-08-12 DIAGNOSIS — G8929 Other chronic pain: Secondary | ICD-10-CM | POA: Diagnosis not present

## 2020-08-12 NOTE — Therapy (Signed)
[place], [place], [address] Phone: [phone]   Fax:  [phone]  Physical Therapy Evaluation  Patient Details  Name: Stephanie Serrano MRN: [id number] Date of Birth: 10-31-1966 Referring Provider (PT): Gentry Fitz, MD   Encounter Date: 08/12/2020   PT End of Session - 08/12/20 1021    Visit Number 1    Number of Visits 13    Date for PT Re-Evaluation 09/24/20    Authorization Type humana MCR    PT Start Time 1018    PT Stop Time 1057    PT Time Calculation (min) 39 min    Activity Tolerance Patient tolerated treatment well    Behavior During Therapy [facility] for tasks assessed/performed           Past Medical History:  Diagnosis Date  . Allergy   . Anemia   . Anxiety   . Arthritis   . Asthma   . Blood transfusion without reported diagnosis   . Depression   . Eczema   . Esophageal stricture   . GERD (gastroesophageal reflux disease)   . Migraines   . OCD (obsessive compulsive disorder)   . RLS (restless legs syndrome)   . Vertigo     Past Surgical History:  Procedure Laterality Date  . ESOPHAGOGASTRODUODENOSCOPY N/A 02/09/2015   Procedure: ESOPHAGOGASTRODUODENOSCOPY (EGD);  Surgeon: Inda Castle, MD;  Location: [facility];  Service: Endoscopy;  Laterality: N/A;  with dilation  . FOOT SURGERY     bone spurs  . NASAL SINUS SURGERY    . PLANTAR FASCIA RELEASE Right 08/03/2016   Procedure: PLANTAR FASCIA RELEASE WITH BONE SPUR EXCISION;  Surgeon: Dorna Leitz, MD;  Location: [place];  Service: Orthopedics;  Laterality: Right;  . TONSILLECTOMY      There were no vitals filed for this visit.    Subjective Assessment - 08/12/20 1025    Subjective Tried an injection but caused breathrough symtpoms of exema. Just after I left here last time, my daughter had foot surgery and I was pushing her around in a chair and lifting her over the dividers bw carpet and flooring.     Patient Stated Goals decr pain    Currently in Pain? Yes    Pain Score 8     Pain Location Sacrum    Pain Orientation Left    Pain Descriptors / Indicators Sharp    Pain Type Chronic pain    Aggravating Factors  constant    Pain Relieving Factors heat/ice              [facility] PT Assessment - 08/12/20 0001      Assessment   Medical Diagnosis bil SIJ pain    Referring Provider (PT) Gentry Fitz, MD    Hand Dominance Right      Precautions   Precautions None      Restrictions   Weight Bearing Restrictions No      Balance Screen   Has the patient fallen in the past 6 months No      [place] residence    Living Arrangements Children    Additional Comments 3rd floor apartment      Prior Function   Level of Independence Independent      Cognition   Overall Cognitive Status Within Functional Limits for tasks assessed      Observation/Other Assessments   Focus on Therapeutic Outcomes (FOTO)  64% limited  Posture/Postural Control   Posture Comments Lt innominate rotation/elevated in standing      Palpation   Palpation comment TTP Lt SIJ                      Objective measurements completed on examination: See above findings.       [facility] Adult PT Treatment/Exercise - 08/12/20 0001      Exercises   Exercises Lumbar      Lumbar Exercises: Stretches   Other Lumbar Stretch Exercise left treatment Lt outflare      Lumbar Exercises: Standing   Other Standing Lumbar Exercises self treatment for sacral torsion                  PT Education - 08/12/20 1210    Education Details anatomy of condition, POC, HEP, FOTO, exercise form/rationale    Person(s) Educated Patient    Methods Explanation;Demonstration;Tactile cues;Verbal cues;Handout    Comprehension Verbalized understanding;Returned demonstration;Verbal cues required;Tactile cues required;Need further instruction            PT Short  Term Goals - 08/12/20 1209      PT SHORT TERM GOAL #1   Title pt will present with neutral pelvic rotation    Baseline corrected at eval    Time 4    Period Weeks    Status New    Target Date 09/09/20      PT SHORT TERM GOAL #2   Title .             PT Long Term Goals - 08/12/20 1207      PT LONG TERM GOAL #1   Title gross LE strength to 5/5    Baseline not appropriate to test at eval due to pain levels    Time 6    Period Weeks    Status New    Target Date 09/23/20      PT LONG TERM GOAL #2   Title pt will be able to navigate stairs at home without incr SIJ pain    Baseline pain at eval    Time 6    Period Weeks    Status New    Target Date 09/23/20      PT LONG TERM GOAL #3   Title pt will demo proper squat/lift form to support body mechanics in ADLs without incr in LBP    Baseline pain at eval    Time 6    Period Weeks    Status New    Target Date 09/23/20      PT LONG TERM GOAL #4   Title pt will be independent in long term HEP for core and lumbopelvic strength/stability    Baseline will progress as tolerated    Time 6    Period Weeks    Status New    Target Date 09/23/20      PT LONG TERM GOAL #5   Title .                  Plan - 08/12/20 1054    Clinical Impression Statement two layer lift added to Rt shoe, was previously in 1 layer. Pt presents to PT with complaints of Lt SIJ pain that has been chronic. She reports lifting her daughter in a rolling chair when she felt it shift and has felt "crooked" since then. Presents with ant rotation of Lt innominate with mild outflare. A single layer lift was placed in her Rt shoe  in her last episode but felt much more comfortable with 2-layers. Pain levels were very high so she was given low load-long duration stretches with heel-lift wear to decrease pain to improve tolerance to core stability execises.    Personal Factors and Comorbidities Time since onset of injury/illness/exacerbation;Comorbidity 1     Comorbidities RLS,  OCD    Examination-Activity Limitations Locomotion Level;Bed Mobility;Bend;Sit;Sleep;Squat;Stairs;Stand;Lift    Examination-Participation Restrictions Cleaning;Meal Prep    Stability/Clinical Decision Making Evolving/Moderate complexity    Clinical Decision Making Moderate    Rehab Potential Good    PT Frequency 2x / week    PT Duration 6 weeks    PT Treatment/Interventions ADLs/Self Care Home Management;Cryotherapy;Electrical Stimulation;Traction;Moist Heat;Iontophoresis 4mg /ml Dexamethasone;Gait training;Stair training;Functional mobility training;Therapeutic activities;Therapeutic exercise;Patient/family education;Neuromuscular re-education;Manual techniques;Taping;Dry needling;Passive range of motion;Spinal Manipulations;Joint Manipulations    PT Next Visit Plan DN to SIJ musculature, outcome of heel lift?    PT Home Exercise Plan self correction for Lt outflare, SIJ corretion at wall    Consulted and Agree with Plan of Care Patient           Patient will benefit from skilled therapeutic intervention in order to improve the following deficits and impairments:  Improper body mechanics, Pain, Postural dysfunction, Increased muscle spasms, Decreased activity tolerance, Decreased strength, Difficulty walking  Visit Diagnosis: Chronic pain of left knee - Plan: PT plan of care cert/re-cert  Muscle weakness (generalized) - Plan: PT plan of care cert/re-cert     Problem List Patient Active Problem List   Diagnosis Date Noted  . Chest pain of uncertain etiology 91/50/5697  . Heart palpitations 09/05/2019  . Shortness of breath 09/05/2019  . Anxiety 09/05/2019  . Tachycardia 07/23/2019  . Sprain of right ankle 02/03/2019  . MDD (major depressive disorder), recurrent episode, moderate ([place]) 06/28/2017  . OCD (obsessive compulsive disorder) 03/26/2017  . Obstructive sleep apnea of adult 01/18/2017  . Insomnia 02/08/2016  . Heel spur 09/01/2015  . Family history  of cystitis 08/31/2015  . Left foot pain 08/31/2015  . Prediabetes 08/23/2015  . Hyperlipidemia 08/23/2015  . Depression 03/21/2015  . Acute esophagitis 02/09/2015  . Gastroesophageal reflux disease without esophagitis 08/27/2014  . Dysphagia, unspecified(787.20) 03/22/2014   Zedrick Springsteen C. Camdin Hegner PT, DPT 08/12/20 12:13 PM   [place] [facility] [address] [place], [place], [address] Phone: [phone]   Fax:  [phone]  Name: Stephanie Serrano MRN: [id number] Date of Birth: 12/31/1965

## 2020-08-17 ENCOUNTER — Encounter: Payer: Self-pay | Admitting: Physical Therapy

## 2020-08-17 ENCOUNTER — Ambulatory Visit: Payer: Medicare HMO | Admitting: Physical Therapy

## 2020-08-17 ENCOUNTER — Other Ambulatory Visit: Payer: Self-pay

## 2020-08-17 DIAGNOSIS — G8929 Other chronic pain: Secondary | ICD-10-CM

## 2020-08-17 DIAGNOSIS — M6281 Muscle weakness (generalized): Secondary | ICD-10-CM

## 2020-08-17 DIAGNOSIS — M25562 Pain in left knee: Secondary | ICD-10-CM | POA: Diagnosis not present

## 2020-08-17 NOTE — Therapy (Signed)
[place], [place], [address] Phone: [phone]   Fax:  [phone]  Physical Therapy Treatment  Patient Details  Name: Stephanie Serrano MRN: [id number] Date of Birth: 03-17-1966 Referring Provider (PT): Gentry Fitz, MD   Encounter Date: 08/17/2020   PT End of Session - 08/17/20 1104    Visit Number 2    Number of Visits 13    Date for PT Re-Evaluation 09/24/20    Authorization Type humana MCR    PT Start Time 1100    PT Stop Time 1142    PT Time Calculation (min) 42 min    Activity Tolerance Patient tolerated treatment well    Behavior During Therapy [facility] for tasks assessed/performed           Past Medical History:  Diagnosis Date  . Allergy   . Anemia   . Anxiety   . Arthritis   . Asthma   . Blood transfusion without reported diagnosis   . Depression   . Eczema   . Esophageal stricture   . GERD (gastroesophageal reflux disease)   . Migraines   . OCD (obsessive compulsive disorder)   . RLS (restless legs syndrome)   . Vertigo     Past Surgical History:  Procedure Laterality Date  . ESOPHAGOGASTRODUODENOSCOPY N/A 02/09/2015   Procedure: ESOPHAGOGASTRODUODENOSCOPY (EGD);  Surgeon: Inda Castle, MD;  Location: [facility];  Service: Endoscopy;  Laterality: N/A;  with dilation  . FOOT SURGERY     bone spurs  . NASAL SINUS SURGERY    . PLANTAR FASCIA RELEASE Right 08/03/2016   Procedure: PLANTAR FASCIA RELEASE WITH BONE SPUR EXCISION;  Surgeon: Dorna Leitz, MD;  Location: [facility];  Service: Orthopedics;  Laterality: Right;  . TONSILLECTOMY      There were no vitals filed for this visit.   Subjective Assessment - 08/17/20 1102    Subjective I think I forgot to wear the lift at home some. It does feel tight but on the Lt side. I focused more on stretches than the exercises because I didn't want to do them wrong. Sat on a hard bench for a couple of hours yesterday.     Patient Stated Goals decr pain    Currently in Pain? Yes    Pain Score 7     Pain Location Sacrum    Pain Orientation Right;Left    Pain Descriptors / Indicators Tightness                             [facility] Adult PT Treatment/Exercise - 08/17/20 0001      Lumbar Exercises: Stretches   Quadruped Mid Back Stretch Limitations child pose- elbows bent    Other Lumbar Stretch Exercise pigeon pose      Lumbar Exercises: Aerobic   Stationary Bike 5 min L6      Lumbar Exercises: Machines for Strengthening   Leg Press supine leg press, 2 plates x20      Lumbar Exercises: Seated   Other Seated Lumbar Exercises pelvic clocks on physioball    Other Seated Lumbar Exercises seated lean back with feet walk out      Manual Therapy   Manual Therapy Soft tissue mobilization    Manual therapy comments skilled palpation and monitoring during TPDN    Soft tissue mobilization Rt gluts            Trigger Point Dry Needling -  08/17/20 0001    Consent Given? Yes    Education Handout Provided Previously provided    Muscles Treated Back/Hip Gluteus minimus;Gluteus medius;Gluteus maximus;Piriformis    Gluteus Minimus Response Twitch response elicited;Palpable increased muscle length   Rt   Gluteus Medius Response Twitch response elicited;Palpable increased muscle length   Rt   Gluteus Maximus Response Twitch response elicited;Palpable increased muscle length   Rt   Piriformis Response Twitch response elicited;Palpable increased muscle length   Rt                 PT Short Term Goals - 08/12/20 1209      PT SHORT TERM GOAL #1   Title pt will present with neutral pelvic rotation    Baseline corrected at eval    Time 4    Period Weeks    Status New    Target Date 09/09/20      PT SHORT TERM GOAL #2   Title .             PT Long Term Goals - 08/12/20 1207      PT LONG TERM GOAL #1   Title gross LE strength to 5/5    Baseline not appropriate to test at eval  due to pain levels    Time 6    Period Weeks    Status New    Target Date 09/23/20      PT LONG TERM GOAL #2   Title pt will be able to navigate stairs at home without incr SIJ pain    Baseline pain at eval    Time 6    Period Weeks    Status New    Target Date 09/23/20      PT LONG TERM GOAL #3   Title pt will demo proper squat/lift form to support body mechanics in ADLs without incr in LBP    Baseline pain at eval    Time 6    Period Weeks    Status New    Target Date 09/23/20      PT LONG TERM GOAL #4   Title pt will be independent in long term HEP for core and lumbopelvic strength/stability    Baseline will progress as tolerated    Time 6    Period Weeks    Status New    Target Date 09/23/20      PT LONG TERM GOAL #5   Title .                 Plan - 08/17/20 1144    Clinical Impression Statement tightness in Rt gluts and piriformis released with DN today. Encouraged activatoin of core with equal activation of bil hips.    PT Treatment/Interventions ADLs/Self Care Home Management;Cryotherapy;Electrical Stimulation;Traction;Moist Heat;Iontophoresis 4mg /ml Dexamethasone;Gait training;Stair training;Functional mobility training;Therapeutic activities;Therapeutic exercise;Patient/family education;Neuromuscular re-education;Manual techniques;Taping;Dry needling;Passive range of motion;Spinal Manipulations;Joint Manipulations    PT Next Visit Plan DN PRN, cont core strengthening    PT Home Exercise Plan self correction for Lt outflare, SIJ corretion at wall, [id number]    Consulted and Agree with Plan of Care Patient           Patient will benefit from skilled therapeutic intervention in order to improve the following deficits and impairments:  Improper body mechanics, Pain, Postural dysfunction, Increased muscle spasms, Decreased activity tolerance, Decreased strength, Difficulty walking  Visit Diagnosis: Chronic pain of left knee  Muscle weakness  (generalized)     Problem List Patient Active Problem List  Diagnosis Date Noted  . Chest pain of uncertain etiology 61/95/0932  . Heart palpitations 09/05/2019  . Shortness of breath 09/05/2019  . Anxiety 09/05/2019  . Tachycardia 07/23/2019  . Sprain of right ankle 02/03/2019  . MDD (major depressive disorder), recurrent episode, moderate ([place]) 06/28/2017  . OCD (obsessive compulsive disorder) 03/26/2017  . Obstructive sleep apnea of adult 01/18/2017  . Insomnia 02/08/2016  . Heel spur 09/01/2015  . Family history of cystitis 08/31/2015  . Left foot pain 08/31/2015  . Prediabetes 08/23/2015  . Hyperlipidemia 08/23/2015  . Depression 03/21/2015  . Acute esophagitis 02/09/2015  . Gastroesophageal reflux disease without esophagitis 08/27/2014  . Dysphagia, unspecified(787.20) 03/22/2014   Tymeir Weathington C. Jesica Goheen PT, DPT 08/17/20 11:46 AM   [facility] [address] [place], [place], [address] Phone: [phone]   Fax:  [phone]  Name: Stephanie Serrano MRN: [id number] Date of Birth: [birth year]

## 2020-08-19 ENCOUNTER — Encounter: Payer: Self-pay | Admitting: Physical Therapy

## 2020-08-19 ENCOUNTER — Other Ambulatory Visit: Payer: Self-pay

## 2020-08-19 ENCOUNTER — Ambulatory Visit: Payer: Medicare HMO | Admitting: Physical Therapy

## 2020-08-19 DIAGNOSIS — M25562 Pain in left knee: Secondary | ICD-10-CM

## 2020-08-19 DIAGNOSIS — G8929 Other chronic pain: Secondary | ICD-10-CM | POA: Diagnosis not present

## 2020-08-19 DIAGNOSIS — M6281 Muscle weakness (generalized): Secondary | ICD-10-CM

## 2020-08-19 NOTE — Therapy (Signed)
[facility] [place], [place], [address] Phone: [phone]   Fax:  [phone]  Physical Therapy Treatment  Patient Details  Name: Stephanie Serrano MRN: [id number] Date of Birth: 03/10/1966 Referring Provider (PT): Gentry Fitz, MD   Encounter Date: 08/19/2020   PT End of Session - 08/19/20 1024    Visit Number 3    Number of Visits 13    Date for PT Re-Evaluation 09/24/20    Authorization Type humana MCR    PT Start Time 1020    PT Stop Time 1059    PT Time Calculation (min) 39 min    Activity Tolerance Patient tolerated treatment well    Behavior During Therapy [facility] for tasks assessed/performed           Past Medical History:  Diagnosis Date  . Allergy   . Anemia   . Anxiety   . Arthritis   . Asthma   . Blood transfusion without reported diagnosis   . Depression   . Eczema   . Esophageal stricture   . GERD (gastroesophageal reflux disease)   . Migraines   . OCD (obsessive compulsive disorder)   . RLS (restless legs syndrome)   . Vertigo     Past Surgical History:  Procedure Laterality Date  . ESOPHAGOGASTRODUODENOSCOPY N/A 02/09/2015   Procedure: ESOPHAGOGASTRODUODENOSCOPY (EGD);  Surgeon: Inda Castle, MD;  Location: [facility];  Service: Endoscopy;  Laterality: N/A;  with dilation  . FOOT SURGERY     bone spurs  . NASAL SINUS SURGERY    . PLANTAR FASCIA RELEASE Right 08/03/2016   Procedure: PLANTAR FASCIA RELEASE WITH BONE SPUR EXCISION;  Surgeon: Dorna Leitz, MD;  Location: [facility];  Service: Orthopedics;  Laterality: Right;  . TONSILLECTOMY      There were no vitals filed for this visit.   Subjective Assessment - 08/19/20 1025    Subjective I am hurting again on the Lt. I decreased the lift by 1 level and so far-so good. No pulling on Rt since last visit. Was able to sit at football game yesterday. It feels better.    Patient Stated Goals decr pain    Currently  in Pain? Yes    Pain Score 6     Pain Location Back    Pain Orientation Right;Left                             [facility] Adult PT Treatment/Exercise - 08/19/20 0001      Lumbar Exercises: Stretches   Piriformis Stretch Right;Left;30 seconds    Gastroc Stretch Right;Left;30 seconds;2 reps      Lumbar Exercises: Aerobic   Stationary Bike 5 min L4      Lumbar Exercises: Supine   Clam Limitations green tband    Bridge with [employer] 15 reps    Bridge with [employer] Limitations in DF, 5s holds    Bridge with [employer] Limitations green tband with LE ER    Other Supine Lumbar Exercises marching green tband around knees- cues for post pelvic tilt                    PT Short Term Goals - 08/12/20 1209      PT SHORT TERM GOAL #1   Title pt will present with neutral pelvic rotation    Baseline corrected at eval    Time 4  Period Weeks    Status New    Target Date 09/09/20      PT SHORT TERM GOAL #2   Title .             PT Long Term Goals - 08/12/20 1207      PT LONG TERM GOAL #1   Title gross LE strength to 5/5    Baseline not appropriate to test at eval due to pain levels    Time 6    Period Weeks    Status New    Target Date 09/23/20      PT LONG TERM GOAL #2   Title pt will be able to navigate stairs at home without incr SIJ pain    Baseline pain at eval    Time 6    Period Weeks    Status New    Target Date 09/23/20      PT LONG TERM GOAL #3   Title pt will demo proper squat/lift form to support body mechanics in ADLs without incr in LBP    Baseline pain at eval    Time 6    Period Weeks    Status New    Target Date 09/23/20      PT LONG TERM GOAL #4   Title pt will be independent in long term HEP for core and lumbopelvic strength/stability    Baseline will progress as tolerated    Time 6    Period Weeks    Status New    Target Date 09/23/20      PT LONG TERM GOAL #5   Title .                 Plan -  08/19/20 1059    Clinical Impression Statement Equal tightness bilaterally through superior gluts without symptoms into thigh. worked on coordination in movements with cues for core activation. Reported continued feeling tight across buttock but still feeling better than it was.    PT Treatment/Interventions ADLs/Self Care Home Management;Cryotherapy;Electrical Stimulation;Traction;Moist Heat;Iontophoresis 4mg /ml Dexamethasone;Gait training;Stair training;Functional mobility training;Therapeutic activities;Therapeutic exercise;Patient/family education;Neuromuscular re-education;Manual techniques;Taping;Dry needling;Passive range of motion;Spinal Manipulations;Joint Manipulations    PT Next Visit Plan DN PRN, cont core strengthening    PT Home Exercise Plan self correction for Lt outflare, SIJ corretion at wall, [id number]    Consulted and Agree with Plan of Care Patient           Patient will benefit from skilled therapeutic intervention in order to improve the following deficits and impairments:  Improper body mechanics, Pain, Postural dysfunction, Increased muscle spasms, Decreased activity tolerance, Decreased strength, Difficulty walking  Visit Diagnosis: Chronic pain of left knee  Muscle weakness (generalized)     Problem List Patient Active Problem List   Diagnosis Date Noted  . Chest pain of uncertain etiology 40/98/1191  . Heart palpitations 09/05/2019  . Shortness of breath 09/05/2019  . Anxiety 09/05/2019  . Tachycardia 07/23/2019  . Sprain of right ankle 02/03/2019  . MDD (major depressive disorder), recurrent episode, moderate ([place]) 06/28/2017  . OCD (obsessive compulsive disorder) 03/26/2017  . Obstructive sleep apnea of adult 01/18/2017  . Insomnia 02/08/2016  . Heel spur 09/01/2015  . Family history of cystitis 08/31/2015  . Left foot pain 08/31/2015  . Prediabetes 08/23/2015  . Hyperlipidemia 08/23/2015  . Depression 03/21/2015  . Acute esophagitis 02/09/2015  .  Gastroesophageal reflux disease without esophagitis 08/27/2014  . Dysphagia, unspecified(787.20) 03/22/2014   Mendel Binsfeld C. Hadi Dubin PT, DPT 08/19/20 11:01 AM  [facility] [place], [place], [address] Phone: [phone]   Fax:  [phone]  Name: Stephanie Serrano MRN: [id number] Date of Birth: [birth year]

## 2020-08-24 ENCOUNTER — Other Ambulatory Visit: Payer: Self-pay

## 2020-08-24 ENCOUNTER — Encounter: Payer: Self-pay | Admitting: Physical Therapy

## 2020-08-24 ENCOUNTER — Ambulatory Visit: Payer: Medicare HMO | Admitting: Physical Therapy

## 2020-08-24 DIAGNOSIS — G8929 Other chronic pain: Secondary | ICD-10-CM | POA: Diagnosis not present

## 2020-08-24 DIAGNOSIS — M25562 Pain in left knee: Secondary | ICD-10-CM

## 2020-08-24 DIAGNOSIS — M6281 Muscle weakness (generalized): Secondary | ICD-10-CM

## 2020-08-24 NOTE — Therapy (Signed)
[place] [place], [place], [address] Phone: [phone]   Fax:  [phone]  Physical Therapy Treatment  Patient Details  Name: Stephanie Serrano MRN: [id number] Date of Birth: [birth year] Referring Provider (PT): Gentry Fitz, MD   Encounter Date: 08/24/2020   PT End of Session - 08/24/20 1107    Visit Number 4    Number of Visits 13    Date for PT Re-Evaluation 09/24/20    Authorization Type humana MCR    PT Start Time 1101    PT Stop Time 1144    PT Time Calculation (min) 43 min    Activity Tolerance Patient tolerated treatment well    Behavior During Therapy [facility] for tasks assessed/performed           Past Medical History:  Diagnosis Date  . Allergy   . Anemia   . Anxiety   . Arthritis   . Asthma   . Blood transfusion without reported diagnosis   . Depression   . Eczema   . Esophageal stricture   . GERD (gastroesophageal reflux disease)   . Migraines   . OCD (obsessive compulsive disorder)   . RLS (restless legs syndrome)   . Vertigo     Past Surgical History:  Procedure Laterality Date  . ESOPHAGOGASTRODUODENOSCOPY N/A 02/09/2015   Procedure: ESOPHAGOGASTRODUODENOSCOPY (EGD);  Surgeon: Inda Castle, MD;  Location: [facility];  Service: Endoscopy;  Laterality: N/A;  with dilation  . FOOT SURGERY     bone spurs  . NASAL SINUS SURGERY    . PLANTAR FASCIA RELEASE Right 08/03/2016   Procedure: PLANTAR FASCIA RELEASE WITH BONE SPUR EXCISION;  Surgeon: Dorna Leitz, MD;  Location: [facility];  Service: Orthopedics;  Laterality: Right;  . TONSILLECTOMY      There were no vitals filed for this visit.   Subjective Assessment - 08/24/20 1103    Subjective Just a little stiffness on Rt side. Knee started acting up after last session. Medial Lt knee feels very tender. 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 No pain on Rt side since DN  but now I feel it on the Lt.    Patient Stated Goals decr pain              [facility] PT Assessment - 08/24/20 0001      Posture/Postural Control   Posture Comments neutral pelvic rotation                         [facility] Adult PT Treatment/Exercise - 08/24/20 0001      Lumbar Exercises: Stretches   Gastroc Stretch Right;Left;30 seconds;2 reps      Lumbar Exercises: Aerobic   Elliptical 5 min L1 ramp 3 after DN      Lumbar Exercises: Seated   Other Seated Lumbar Exercises high kneeling plank roll out on physioball      Lumbar Exercises: Supine   Bridge 15 reps    Bridge Limitations heels together/toes apart    Large Ball Abdominal Isometric 10 reps    Large Ball Oblique Isometric 10 reps   both sides     Lumbar Exercises: Prone   Other Prone Lumbar Exercises knee/elbow plank      Manual Therapy   Manual therapy comments skilled palpation and monitoring during TPDN    Soft tissue mobilization Lt piriformis, glut med/min/TFL  Trigger Point Dry Needling - 08/24/20 0001    Gluteus Medius Response Twitch response elicited;Palpable increased muscle length   min/med/TFL, Lt   Piriformis Response Twitch response elicited;Palpable increased muscle length   Lt                 PT Short Term Goals - 08/12/20 1209      PT SHORT TERM GOAL #1   Title pt will present with neutral pelvic rotation    Baseline corrected at eval    Time 4    Period Weeks    Status New    Target Date 09/09/20      PT SHORT TERM GOAL #2   Title .             PT Long Term Goals - 08/12/20 1207      PT LONG TERM GOAL #1   Title gross LE strength to 5/5    Baseline not appropriate to test at eval due to pain levels    Time 6    Period Weeks    Status New    Target Date 09/23/20      PT LONG TERM GOAL #2   Title pt will be able to navigate stairs at home without incr SIJ pain    Baseline pain at eval    Time 6    Period Weeks    Status New    Target  Date 09/23/20      PT LONG TERM GOAL #3   Title pt will demo proper squat/lift form to support body mechanics in ADLs without incr in LBP    Baseline pain at eval    Time 6    Period Weeks    Status New    Target Date 09/23/20      PT LONG TERM GOAL #4   Title pt will be independent in long term HEP for core and lumbopelvic strength/stability    Baseline will progress as tolerated    Time 6    Period Weeks    Status New    Target Date 09/23/20      PT LONG TERM GOAL #5   Title .                 Plan - 08/24/20 1146    Clinical Impression Statement tension released with DN in Lt hip region which also decreased knee pulling. core progression to planks for core stability in HEP.    PT Treatment/Interventions ADLs/Self Care Home Management;Cryotherapy;Electrical Stimulation;Traction;Moist Heat;Iontophoresis 4mg /ml Dexamethasone;Gait training;Stair training;Functional mobility training;Therapeutic activities;Therapeutic exercise;Patient/family education;Neuromuscular re-education;Manual techniques;Taping;Dry needling;Passive range of motion;Spinal Manipulations;Joint Manipulations    PT Next Visit Plan DN PRN, review/progress planks    PT Home Exercise Plan self correction for Lt outflare, SIJ corretion at wall, [id number]    Consulted and Agree with Plan of Care Patient           Patient will benefit from skilled therapeutic intervention in order to improve the following deficits and impairments:  Improper body mechanics, Pain, Postural dysfunction, Increased muscle spasms, Decreased activity tolerance, Decreased strength, Difficulty walking  Visit Diagnosis: Chronic pain of left knee  Muscle weakness (generalized)     Problem List Patient Active Problem List   Diagnosis Date Noted  . Chest pain of uncertain etiology 76/73/4193  . Heart palpitations 09/05/2019  . Shortness of breath 09/05/2019  . Anxiety 09/05/2019  . Tachycardia 07/23/2019  . Sprain of right  ankle 02/03/2019  . MDD (major depressive disorder),  recurrent episode, moderate ([place]) 06/28/2017  . OCD (obsessive compulsive disorder) 03/26/2017  . Obstructive sleep apnea of adult 01/18/2017  . Insomnia 02/08/2016  . Heel spur 09/01/2015  . Family history of cystitis 08/31/2015  . Left foot pain 08/31/2015  . Prediabetes 08/23/2015  . Hyperlipidemia 08/23/2015  . Depression 03/21/2015  . Acute esophagitis 02/09/2015  . Gastroesophageal reflux disease without esophagitis 08/27/2014  . Dysphagia, unspecified(787.20) 03/22/2014    Eilam Shrewsbury C. Jamyron Redd PT, DPT 08/24/20 11:48 AM   [facility] [address] [place], [place], [address] Phone: [phone]   Fax:  [phone]  Name: Stephanie Serrano MRN: [id number] Date of Birth: 09/04/1966

## 2020-08-25 DIAGNOSIS — Z6834 Body mass index (BMI) 34.0-34.9, adult: Secondary | ICD-10-CM | POA: Diagnosis not present

## 2020-08-25 DIAGNOSIS — R232 Flushing: Secondary | ICD-10-CM | POA: Diagnosis not present

## 2020-08-25 DIAGNOSIS — N951 Menopausal and female climacteric states: Secondary | ICD-10-CM | POA: Diagnosis not present

## 2020-08-25 DIAGNOSIS — R002 Palpitations: Secondary | ICD-10-CM | POA: Diagnosis not present

## 2020-08-25 DIAGNOSIS — R635 Abnormal weight gain: Secondary | ICD-10-CM | POA: Diagnosis not present

## 2020-08-26 ENCOUNTER — Ambulatory Visit: Payer: Medicare HMO | Admitting: Physical Therapy

## 2020-08-26 ENCOUNTER — Other Ambulatory Visit: Payer: Self-pay

## 2020-08-26 ENCOUNTER — Encounter: Payer: Self-pay | Admitting: Physical Therapy

## 2020-08-26 DIAGNOSIS — N951 Menopausal and female climacteric states: Secondary | ICD-10-CM | POA: Diagnosis not present

## 2020-08-26 DIAGNOSIS — G8929 Other chronic pain: Secondary | ICD-10-CM | POA: Diagnosis not present

## 2020-08-26 DIAGNOSIS — M6281 Muscle weakness (generalized): Secondary | ICD-10-CM | POA: Diagnosis not present

## 2020-08-26 DIAGNOSIS — R635 Abnormal weight gain: Secondary | ICD-10-CM | POA: Diagnosis not present

## 2020-08-26 DIAGNOSIS — M25562 Pain in left knee: Secondary | ICD-10-CM | POA: Diagnosis not present

## 2020-08-26 NOTE — Therapy (Signed)
[place], [place], [address] Phone: [phone]   Fax:  [phone]  Physical Therapy Treatment  Patient Details  Name: Stephanie Serrano MRN: [id number] Date of Birth: 30-Aug-1966 Referring Provider (PT): Gentry Fitz, MD   Encounter Date: 08/26/2020   PT End of Session - 08/26/20 1151    Visit Number 5    Number of Visits 13    Date for PT Re-Evaluation 09/24/20    Authorization Type humana MCR    PT Start Time 1147    PT Stop Time 1228    PT Time Calculation (min) 41 min    Activity Tolerance Patient tolerated treatment well    Behavior During Therapy [facility] for tasks assessed/performed           Past Medical History:  Diagnosis Date  . Allergy   . Anemia   . Anxiety   . Arthritis   . Asthma   . Blood transfusion without reported diagnosis   . Depression   . Eczema   . Esophageal stricture   . GERD (gastroesophageal reflux disease)   . Migraines   . OCD (obsessive compulsive disorder)   . RLS (restless legs syndrome)   . Vertigo     Past Surgical History:  Procedure Laterality Date  . ESOPHAGOGASTRODUODENOSCOPY N/A 02/09/2015   Procedure: ESOPHAGOGASTRODUODENOSCOPY (EGD);  Surgeon: Inda Castle, MD;  Location: [facility];  Service: Endoscopy;  Laterality: N/A;  with dilation  . FOOT SURGERY     bone spurs  . NASAL SINUS SURGERY    . PLANTAR FASCIA RELEASE Right 08/03/2016   Procedure: PLANTAR FASCIA RELEASE WITH BONE SPUR EXCISION;  Surgeon: Dorna Leitz, MD;  Location: [facility];  Service: Orthopedics;  Laterality: Right;  . TONSILLECTOMY      There were no vitals filed for this visit.   Subjective Assessment - 08/26/20 1152    Subjective Hip is doing better, some tightness in Rt SIJ. Lt knee is acting up which was ok until this morning when it gradually increased in pain.    Patient Stated Goals decr pain    Currently in Pain? Yes    Pain Score 7     Pain  Location Sacrum    Pain Orientation Right    Pain Descriptors / Indicators Tightness    Aggravating Factors  bending fwd                             [facility] Adult PT Treatment/Exercise - 08/26/20 0001      Lumbar Exercises: Stretches   Gastroc Stretch Right;Left;2 reps;20 seconds    Other Lumbar Stretch Exercise self correction for Lt post ilium 2 min hold    Other Lumbar Stretch Exercise self treatment for forward bent sacrum      Lumbar Exercises: Aerobic   Recumbent Bike 5 min L4      Lumbar Exercises: Standing   Other Standing Lumbar Exercises squat pull off from FM bar    Other Standing Lumbar Exercises sumo squats      Modalities   Modalities Moist Heat      Moist Heat Therapy   Number Minutes Moist Heat 10 Minutes    Moist Heat Location Lumbar Spine                    PT Short Term Goals - 08/12/20 1209      PT SHORT TERM  GOAL #1   Title pt will present with neutral pelvic rotation    Baseline corrected at eval    Time 4    Period Weeks    Status New    Target Date 09/09/20      PT SHORT TERM GOAL #2   Title .             PT Long Term Goals - 08/12/20 1207      PT LONG TERM GOAL #1   Title gross LE strength to 5/5    Baseline not appropriate to test at eval due to pain levels    Time 6    Period Weeks    Status New    Target Date 09/23/20      PT LONG TERM GOAL #2   Title pt will be able to navigate stairs at home without incr SIJ pain    Baseline pain at eval    Time 6    Period Weeks    Status New    Target Date 09/23/20      PT LONG TERM GOAL #3   Title pt will demo proper squat/lift form to support body mechanics in ADLs without incr in LBP    Baseline pain at eval    Time 6    Period Weeks    Status New    Target Date 09/23/20      PT LONG TERM GOAL #4   Title pt will be independent in long term HEP for core and lumbopelvic strength/stability    Baseline will progress as tolerated    Time 6    Period  Weeks    Status New    Target Date 09/23/20      PT LONG TERM GOAL #5   Title .                 Plan - 08/26/20 1310    Clinical Impression Statement Lt post illium noted and corrected with stretch,. Notable anterior sacrum increasing lumbar lordosis. Pt reported she could feel things moving but it didn't hurt.    PT Treatment/Interventions ADLs/Self Care Home Management;Cryotherapy;Electrical Stimulation;Traction;Moist Heat;Iontophoresis 4mg /ml Dexamethasone;Gait training;Stair training;Functional mobility training;Therapeutic activities;Therapeutic exercise;Patient/family education;Neuromuscular re-education;Manual techniques;Taping;Dry needling;Passive range of motion;Spinal Manipulations;Joint Manipulations    PT Next Visit Plan DN PRN, check Hesch exercises, progress planks    PT Home Exercise Plan self correction for Lt outflare, SIJ corretion at wall, [id number]    Consulted and Agree with Plan of Care Patient           Patient will benefit from skilled therapeutic intervention in order to improve the following deficits and impairments:  Improper body mechanics, Pain, Postural dysfunction, Increased muscle spasms, Decreased activity tolerance, Decreased strength, Difficulty walking  Visit Diagnosis: Chronic pain of left knee  Muscle weakness (generalized)     Problem List Patient Active Problem List   Diagnosis Date Noted  . Chest pain of uncertain etiology 37/16/9678  . Heart palpitations 09/05/2019  . Shortness of breath 09/05/2019  . Anxiety 09/05/2019  . Tachycardia 07/23/2019  . Sprain of right ankle 02/03/2019  . MDD (major depressive disorder), recurrent episode, moderate ([place]) 06/28/2017  . OCD (obsessive compulsive disorder) 03/26/2017  . Obstructive sleep apnea of adult 01/18/2017  . Insomnia 02/08/2016  . Heel spur 09/01/2015  . Family history of cystitis 08/31/2015  . Left foot pain 08/31/2015  . Prediabetes 08/23/2015  . Hyperlipidemia  08/23/2015  . Depression 03/21/2015  . Acute esophagitis 02/09/2015  .  Gastroesophageal reflux disease without esophagitis 08/27/2014  . Dysphagia, unspecified(787.20) 03/22/2014    Zenaya Ulatowski C. Wilton Thrall PT, DPT 08/26/20 1:12 PM   [facility] [address] [place], [place], [address] Phone: [phone]   Fax:  [phone]  Name: Stephanie Serrano MRN: [id number] Date of Birth: May 11, 1966

## 2020-08-30 DIAGNOSIS — H527 Unspecified disorder of refraction: Secondary | ICD-10-CM | POA: Diagnosis not present

## 2020-08-31 ENCOUNTER — Encounter: Payer: Self-pay | Admitting: Physical Therapy

## 2020-08-31 ENCOUNTER — Other Ambulatory Visit: Payer: Self-pay

## 2020-08-31 ENCOUNTER — Ambulatory Visit: Payer: Medicare HMO | Admitting: Physical Therapy

## 2020-08-31 DIAGNOSIS — M25562 Pain in left knee: Secondary | ICD-10-CM

## 2020-08-31 DIAGNOSIS — G8929 Other chronic pain: Secondary | ICD-10-CM

## 2020-08-31 DIAGNOSIS — M6281 Muscle weakness (generalized): Secondary | ICD-10-CM | POA: Diagnosis not present

## 2020-08-31 DIAGNOSIS — R002 Palpitations: Secondary | ICD-10-CM | POA: Diagnosis not present

## 2020-08-31 DIAGNOSIS — N951 Menopausal and female climacteric states: Secondary | ICD-10-CM | POA: Diagnosis not present

## 2020-08-31 NOTE — Therapy (Signed)
[place], [place], [address] Phone: [phone]   Fax:  [phone]  Physical Therapy Treatment  Patient Details  Name: Stephanie Serrano MRN: [id number] Date of Birth: 31-Jan-1966 Referring Provider (PT): Gentry Fitz, MD   Encounter Date: 08/31/2020   PT End of Session - 08/31/20 1113    Visit Number 6    Number of Visits 13    Date for PT Re-Evaluation 09/24/20    Authorization Type humana MCR    PT Start Time 1103    PT Stop Time 1142    PT Time Calculation (min) 39 min    Activity Tolerance Patient tolerated treatment well    Behavior During Therapy [facility] for tasks assessed/performed           Past Medical History:  Diagnosis Date   Allergy    Anemia    Anxiety    Arthritis    Asthma    Blood transfusion without reported diagnosis    Depression    Eczema    Esophageal stricture    GERD (gastroesophageal reflux disease)    Migraines    OCD (obsessive compulsive disorder)    RLS (restless legs syndrome)    Vertigo     Past Surgical History:  Procedure Laterality Date   ESOPHAGOGASTRODUODENOSCOPY N/A 02/09/2015   Procedure: ESOPHAGOGASTRODUODENOSCOPY (EGD);  Surgeon: Inda Castle, MD;  Location: [facility];  Service: Endoscopy;  Laterality: N/A;  with dilation   FOOT SURGERY     bone spurs   NASAL SINUS SURGERY     PLANTAR FASCIA RELEASE Right 08/03/2016   Procedure: PLANTAR FASCIA RELEASE WITH BONE SPUR EXCISION;  Surgeon: Dorna Leitz, MD;  Location: [place];  Service: Orthopedics;  Laterality: Right;   TONSILLECTOMY      There were no vitals filed for this visit.   Subjective Assessment - 08/31/20 1105    Subjective Just a little band across my back that is tight. Across bil SIJ. I have been packing to move.    Currently in Pain? Yes    Pain Score 7     Pain Location Back    Pain Orientation Lower    Pain Descriptors / Indicators  Tightness              [facility] PT Assessment - 08/31/20 0001      Observation/Other Assessments   Focus on Therapeutic Outcomes (FOTO)  44% limited                         [facility] Adult PT Treatment/Exercise - 08/31/20 0001      Lumbar Exercises: Stretches   Gastroc Stretch Right;Left;2 reps;30 seconds      Lumbar Exercises: Aerobic   Recumbent Bike 5 min L4      Lumbar Exercises: Machines for Strengthening   Other Lumbar Machine Exercise free motion: chops 10lb, punches 7lb      Lumbar Exercises: Standing   Other Standing Lumbar Exercises lateral wobble board with fwd punches bllue plyoball    Other Standing Lumbar Exercises hip hinge to lift- took away weight with focus on glut activation &using squat                    PT Short Term Goals - 08/12/20 1209      PT SHORT TERM GOAL #1   Title pt will present with neutral pelvic rotation    Baseline corrected at eval  Time 4    Period Weeks    Status New    Target Date 09/09/20      PT SHORT TERM GOAL #2   Title .             PT Long Term Goals - 08/12/20 1207      PT LONG TERM GOAL #1   Title gross LE strength to 5/5    Baseline not appropriate to test at eval due to pain levels    Time 6    Period Weeks    Status New    Target Date 09/23/20      PT LONG TERM GOAL #2   Title pt will be able to navigate stairs at home without incr SIJ pain    Baseline pain at eval    Time 6    Period Weeks    Status New    Target Date 09/23/20      PT LONG TERM GOAL #3   Title pt will demo proper squat/lift form to support body mechanics in ADLs without incr in LBP    Baseline pain at eval    Time 6    Period Weeks    Status New    Target Date 09/23/20      PT LONG TERM GOAL #4   Title pt will be independent in long term HEP for core and lumbopelvic strength/stability    Baseline will progress as tolerated    Time 6    Period Weeks    Status New    Target Date 09/23/20      PT LONG  TERM GOAL #5   Title .                 Plan - 08/31/20 1113    Clinical Impression Statement Pt improved FOTO score significantly but continues to report the same level of tightness across her lower back. Is not going to the gym like she usually does and is in the process of moving. When discussing FOTO answers, she has difficulty with added tasks such as lifting groceries and climbing the stairs at the same tim.Pt requied heavy cues for use of gluts in hip hinge rather than low back.    PT Treatment/Interventions ADLs/Self Care Home Management;Cryotherapy;Electrical Stimulation;Traction;Moist Heat;Iontophoresis 4mg /ml Dexamethasone;Gait training;Stair training;Functional mobility training;Therapeutic activities;Therapeutic exercise;Patient/family education;Neuromuscular re-education;Manual techniques;Taping;Dry needling;Passive range of motion;Spinal Manipulations;Joint Manipulations    PT Next Visit Plan cont to work on functional bending and lifting    PT Home Exercise Plan self correction for Lt outflare, SIJ corretion at wall, [id number]    Consulted and Agree with Plan of Care Patient           Patient will benefit from skilled therapeutic intervention in order to improve the following deficits and impairments:  Improper body mechanics, Pain, Postural dysfunction, Increased muscle spasms, Decreased activity tolerance, Decreased strength, Difficulty walking  Visit Diagnosis: Chronic pain of left knee  Muscle weakness (generalized)     Problem List Patient Active Problem List   Diagnosis Date Noted   Chest pain of uncertain etiology 76/73/4193   Heart palpitations 09/05/2019   Shortness of breath 09/05/2019   Anxiety 09/05/2019   Tachycardia 07/23/2019   Sprain of right ankle 02/03/2019   MDD (major depressive disorder), recurrent episode, moderate ([employer] [facility] [employer]) 06/28/2017   OCD (obsessive compulsive disorder) 03/26/2017   Obstructive sleep apnea of adult 01/18/2017    Insomnia 02/08/2016   Heel spur 09/01/2015   Family history of cystitis 08/31/2015  Left foot pain 08/31/2015   Prediabetes 08/23/2015   Hyperlipidemia 08/23/2015   Depression 03/21/2015   Acute esophagitis 02/09/2015   Gastroesophageal reflux disease without esophagitis 08/27/2014   Dysphagia, unspecified(787.20) 03/22/2014    Doree Kuehne C. Patrycja Mumpower PT, DPT 08/31/20 12:59 PM   [place] [facility] [address] [place], [place], [address] Phone: [phone]   Fax:  [phone]  Name: Stephanie Serrano MRN: [id number] Date of Birth: 07-23-1966

## 2020-09-03 ENCOUNTER — Ambulatory Visit: Payer: Medicare HMO | Admitting: Physical Therapy

## 2020-09-03 ENCOUNTER — Encounter: Payer: Self-pay | Admitting: Physical Therapy

## 2020-09-03 ENCOUNTER — Other Ambulatory Visit: Payer: Self-pay

## 2020-09-03 DIAGNOSIS — G8929 Other chronic pain: Secondary | ICD-10-CM

## 2020-09-03 DIAGNOSIS — M6281 Muscle weakness (generalized): Secondary | ICD-10-CM | POA: Diagnosis not present

## 2020-09-03 DIAGNOSIS — M25562 Pain in left knee: Secondary | ICD-10-CM | POA: Diagnosis not present

## 2020-09-03 NOTE — Therapy (Signed)
[place], [place], [address] Phone: [phone]   Fax:  [phone]  Physical Therapy Treatment  Patient Details  Name: Stephanie Serrano MRN: [id number] Date of Birth: [birth year] Referring Provider (PT): Gentry Fitz, MD   Encounter Date: 09/03/2020   PT End of Session - 09/03/20 1210    Visit Number 7    Number of Visits 13    Date for PT Re-Evaluation 09/24/20    Authorization Type humana MCR    PT Start Time 1200    PT Stop Time 1238    PT Time Calculation (min) 38 min    Activity Tolerance Patient tolerated treatment well    Behavior During Therapy [facility] for tasks assessed/performed           Past Medical History:  Diagnosis Date  . Allergy   . Anemia   . Anxiety   . Arthritis   . Asthma   . Blood transfusion without reported diagnosis   . Depression   . Eczema   . Esophageal stricture   . GERD (gastroesophageal reflux disease)   . Migraines   . OCD (obsessive compulsive disorder)   . RLS (restless legs syndrome)   . Vertigo     Past Surgical History:  Procedure Laterality Date  . ESOPHAGOGASTRODUODENOSCOPY N/A 02/09/2015   Procedure: ESOPHAGOGASTRODUODENOSCOPY (EGD);  Surgeon: Inda Castle, MD;  Location: [facility];  Service: Endoscopy;  Laterality: N/A;  with dilation  . FOOT SURGERY     bone spurs  . NASAL SINUS SURGERY    . PLANTAR FASCIA RELEASE Right 08/03/2016   Procedure: PLANTAR FASCIA RELEASE WITH BONE SPUR EXCISION;  Surgeon: Dorna Leitz, MD;  Location: [place];  Service: Orthopedics;  Laterality: Right;  . TONSILLECTOMY      There were no vitals filed for this visit.   Subjective Assessment - 09/03/20 1204    Subjective Hurting with moving.    Currently in Pain? Yes    Pain Location Hip   Rt knee & lower hip   Pain Orientation Right                             [facility] Adult PT Treatment/Exercise - 09/03/20 0001       Lumbar Exercises: Stretches   Passive Hamstring Stretch Right;Left;2 reps;30 seconds    Passive Hamstring Stretch Limitations supine with strap    Piriformis Stretch Limitations Right, with left leg extended      Lumbar Exercises: Supine   Other Supine Lumbar Exercises dead bug knee to elbow      Lumbar Exercises: Sidelying   Clam Both;20 reps;10 reps      Lumbar Exercises: Quadruped   Opposite Arm/Leg Raise Limitations bird dog with knee to elbow- reduced to LE motion only as option    Other Quadruped Lumbar Exercises ab set with post rocking      Manual Therapy   Manual Therapy Joint mobilization    Joint Mobilization Rt FA AP grade 4                    PT Short Term Goals - 08/12/20 1209      PT SHORT TERM GOAL #1   Title pt will present with neutral pelvic rotation    Baseline corrected at eval    Time 4    Period Weeks    Status New    Target Date  09/09/20      PT SHORT TERM GOAL #2   Title .             PT Long Term Goals - 08/12/20 1207      PT LONG TERM GOAL #1   Title gross LE strength to 5/5    Baseline not appropriate to test at eval due to pain levels    Time 6    Period Weeks    Status New    Target Date 09/23/20      PT LONG TERM GOAL #2   Title pt will be able to navigate stairs at home without incr SIJ pain    Baseline pain at eval    Time 6    Period Weeks    Status New    Target Date 09/23/20      PT LONG TERM GOAL #3   Title pt will demo proper squat/lift form to support body mechanics in ADLs without incr in LBP    Baseline pain at eval    Time 6    Period Weeks    Status New    Target Date 09/23/20      PT LONG TERM GOAL #4   Title pt will be independent in long term HEP for core and lumbopelvic strength/stability    Baseline will progress as tolerated    Time 6    Period Weeks    Status New    Target Date 09/23/20      PT LONG TERM GOAL #5   Title .                 Plan - 09/03/20 1243    Clinical  Impression Statement Hip mobility increased as well as lower band of tightness following hip mobiliizations today. Significant difficulty with core engagement and multiple cues required- added to HEP.    PT Treatment/Interventions ADLs/Self Care Home Management;Cryotherapy;Electrical Stimulation;Traction;Moist Heat;Iontophoresis 4mg /ml Dexamethasone;Gait training;Stair training;Functional mobility training;Therapeutic activities;Therapeutic exercise;Patient/family education;Neuromuscular re-education;Manual techniques;Taping;Dry needling;Passive range of motion;Spinal Manipulations;Joint Manipulations    PT Next Visit Plan cont to work on functional bending and lifting, recheck hip mobility    PT Home Exercise Plan self correction for Lt outflare, SIJ corretion at wall, [id number]    Consulted and Agree with Plan of Care Patient           Patient will benefit from skilled therapeutic intervention in order to improve the following deficits and impairments:  Improper body mechanics, Pain, Postural dysfunction, Increased muscle spasms, Decreased activity tolerance, Decreased strength, Difficulty walking  Visit Diagnosis: Chronic pain of left knee  Muscle weakness (generalized)     Problem List Patient Active Problem List   Diagnosis Date Noted  . Chest pain of uncertain etiology 58/85/0277  . Heart palpitations 09/05/2019  . Shortness of breath 09/05/2019  . Anxiety 09/05/2019  . Tachycardia 07/23/2019  . Sprain of right ankle 02/03/2019  . MDD (major depressive disorder), recurrent episode, moderate ([place]) 06/28/2017  . OCD (obsessive compulsive disorder) 03/26/2017  . Obstructive sleep apnea of adult 01/18/2017  . Insomnia 02/08/2016  . Heel spur 09/01/2015  . Family history of cystitis 08/31/2015  . Left foot pain 08/31/2015  . Prediabetes 08/23/2015  . Hyperlipidemia 08/23/2015  . Depression 03/21/2015  . Acute esophagitis 02/09/2015  . Gastroesophageal reflux disease without  esophagitis 08/27/2014  . Dysphagia, unspecified(787.20) 03/22/2014   Reginaldo Hazard C. Kegan Shepardson PT, DPT 09/03/20 12:45 PM   [facility] [place],  [place], [address] Phone: [phone]   Fax:  [phone]  Name: Stephanie Serrano MRN: [id number] Date of Birth: 02/15/1966

## 2020-09-06 ENCOUNTER — Ambulatory Visit: Payer: Medicare HMO | Admitting: Physical Therapy

## 2020-09-06 ENCOUNTER — Other Ambulatory Visit: Payer: Self-pay

## 2020-09-06 ENCOUNTER — Encounter: Payer: Self-pay | Admitting: Physical Therapy

## 2020-09-06 DIAGNOSIS — G8929 Other chronic pain: Secondary | ICD-10-CM | POA: Diagnosis not present

## 2020-09-06 DIAGNOSIS — M6281 Muscle weakness (generalized): Secondary | ICD-10-CM

## 2020-09-06 DIAGNOSIS — M25562 Pain in left knee: Secondary | ICD-10-CM | POA: Diagnosis not present

## 2020-09-06 NOTE — Therapy (Signed)
[place], [place], [address] Phone: [phone]   Fax:  [phone]  Physical Therapy Treatment  Patient Details  Name: Stephanie Serrano MRN: [id number] Date of Birth: [birth year] Referring Provider (PT): Gentry Fitz, MD   Encounter Date: 09/06/2020   PT End of Session - 09/06/20 1107    Visit Number 8    Number of Visits 13    Date for PT Re-Evaluation 09/24/20    Authorization Type humana MCR    PT Start Time 1105    PT Stop Time 1156    PT Time Calculation (min) 51 min    Activity Tolerance Patient tolerated treatment well    Behavior During Therapy [facility] for tasks assessed/performed           Past Medical History:  Diagnosis Date  . Allergy   . Anemia   . Anxiety   . Arthritis   . Asthma   . Blood transfusion without reported diagnosis   . Depression   . Eczema   . Esophageal stricture   . GERD (gastroesophageal reflux disease)   . Migraines   . OCD (obsessive compulsive disorder)   . RLS (restless legs syndrome)   . Vertigo     Past Surgical History:  Procedure Laterality Date  . ESOPHAGOGASTRODUODENOSCOPY N/A 02/09/2015   Procedure: ESOPHAGOGASTRODUODENOSCOPY (EGD);  Surgeon: Inda Castle, MD;  Location: [facility];  Service: Endoscopy;  Laterality: N/A;  with dilation  . FOOT SURGERY     bone spurs  . NASAL SINUS SURGERY    . PLANTAR FASCIA RELEASE Right 08/03/2016   Procedure: PLANTAR FASCIA RELEASE WITH BONE SPUR EXCISION;  Surgeon: Dorna Leitz, MD;  Location: [place];  Service: Orthopedics;  Laterality: Right;  . TONSILLECTOMY      There were no vitals filed for this visit.   Subjective Assessment - 09/06/20 1107    Subjective I am just extremely sore after moving.                             [place] Adult PT Treatment/Exercise - 09/06/20 0001      Lumbar Exercises: Stretches   Piriformis Stretch Limitations Right, with left  leg extended      Lumbar Exercises: Aerobic   Nustep 5 min L7 UE & LE      Lumbar Exercises: Standing   Other Standing Lumbar Exercises SLS with level hips, marching with level hips; gait with rotation & level pelvis    Other Standing Lumbar Exercises wobble board- static & squats; deep squats pull off from free motion bar      Lumbar Exercises: Supine   Other Supine Lumbar Exercises Rt knee ot left elbow      Lumbar Exercises: Quadruped   Other Quadruped Lumbar Exercises ab set with rocking      Moist Heat Therapy   Number Minutes Moist Heat 10 Minutes    Moist Heat Location Lumbar Spine      Manual Therapy   Joint Mobilization Rt FA AP grade 4                    PT Short Term Goals - 08/12/20 1209      PT SHORT TERM GOAL #1   Title pt will present with neutral pelvic rotation    Baseline corrected at eval    Time 4    Period Weeks  Status New    Target Date 09/09/20      PT SHORT TERM GOAL #2   Title .             PT Long Term Goals - 08/12/20 1207      PT LONG TERM GOAL #1   Title gross LE strength to 5/5    Baseline not appropriate to test at eval due to pain levels    Time 6    Period Weeks    Status New    Target Date 09/23/20      PT LONG TERM GOAL #2   Title pt will be able to navigate stairs at home without incr SIJ pain    Baseline pain at eval    Time 6    Period Weeks    Status New    Target Date 09/23/20      PT LONG TERM GOAL #3   Title pt will demo proper squat/lift form to support body mechanics in ADLs without incr in LBP    Baseline pain at eval    Time 6    Period Weeks    Status New    Target Date 09/23/20      PT LONG TERM GOAL #4   Title pt will be independent in long term HEP for core and lumbopelvic strength/stability    Baseline will progress as tolerated    Time 6    Period Weeks    Status New    Target Date 09/23/20      PT LONG TERM GOAL #5   Title .                 Plan - 09/06/20 1237     Clinical Impression Statement good mobility noted, able to find a stiff aspect of post capsule that was reduced with mobilization. Tends to allow lateral drop of hips rather than engaging abductors and rotating trunk in gait. Is afraid to perform standing deep squats past 90 due to knee pain but is able to do so in quadruped without pain to stretch post hip capsule. Discussed the importance of avoiding compensation for knee at the expense of hip/back.    PT Treatment/Interventions ADLs/Self Care Home Management;Cryotherapy;Electrical Stimulation;Traction;Moist Heat;Iontophoresis 4mg /ml Dexamethasone;Gait training;Stair training;Functional mobility training;Therapeutic activities;Therapeutic exercise;Patient/family education;Neuromuscular re-education;Manual techniques;Taping;Dry needling;Passive range of motion;Spinal Manipulations;Joint Manipulations    PT Next Visit Plan cont to work on functional bending and lifting, recheck hip mobility    PT Home Exercise Plan self correction for Lt outflare, SIJ corretion at wall, [id number]    Consulted and Agree with Plan of Care Patient           Patient will benefit from skilled therapeutic intervention in order to improve the following deficits and impairments:  Improper body mechanics, Pain, Postural dysfunction, Increased muscle spasms, Decreased activity tolerance, Decreased strength, Difficulty walking  Visit Diagnosis: Chronic pain of left knee  Muscle weakness (generalized)     Problem List Patient Active Problem List   Diagnosis Date Noted  . Chest pain of uncertain etiology 01/77/9390  . Heart palpitations 09/05/2019  . Shortness of breath 09/05/2019  . Anxiety 09/05/2019  . Tachycardia 07/23/2019  . Sprain of right ankle 02/03/2019  . MDD (major depressive disorder), recurrent episode, moderate ([place]) 06/28/2017  . OCD (obsessive compulsive disorder) 03/26/2017  . Obstructive sleep apnea of adult 01/18/2017  . Insomnia 02/08/2016  .  Heel spur 09/01/2015  . Family history of cystitis 08/31/2015  . Left foot  pain 08/31/2015  . Prediabetes 08/23/2015  . Hyperlipidemia 08/23/2015  . Depression 03/21/2015  . Acute esophagitis 02/09/2015  . Gastroesophageal reflux disease without esophagitis 08/27/2014  . Dysphagia, unspecified(787.20) 03/22/2014    Trae Bovenzi C. Shamyra Farias PT, DPT 09/06/20 12:43 PM   [facility] [name] [place], [place], [address] Phone: [phone]   Fax:  [phone]  Name: Zailee Vallely MRN: [id number] Date of Birth: May 04, 1966

## 2020-09-08 DIAGNOSIS — L301 Dyshidrosis [pompholyx]: Secondary | ICD-10-CM | POA: Diagnosis not present

## 2020-09-08 DIAGNOSIS — L2089 Other atopic dermatitis: Secondary | ICD-10-CM | POA: Diagnosis not present

## 2020-09-09 ENCOUNTER — Ambulatory Visit: Payer: Medicare HMO | Attending: Family Medicine | Admitting: Physical Therapy

## 2020-09-09 ENCOUNTER — Encounter: Payer: Self-pay | Admitting: Physical Therapy

## 2020-09-09 ENCOUNTER — Other Ambulatory Visit: Payer: Self-pay

## 2020-09-09 DIAGNOSIS — M6281 Muscle weakness (generalized): Secondary | ICD-10-CM | POA: Diagnosis not present

## 2020-09-09 DIAGNOSIS — M25562 Pain in left knee: Secondary | ICD-10-CM | POA: Insufficient documentation

## 2020-09-09 DIAGNOSIS — G8929 Other chronic pain: Secondary | ICD-10-CM | POA: Diagnosis not present

## 2020-09-10 NOTE — Therapy (Signed)
[place], [place], [address] Phone: [phone]   Fax:  [phone]  Physical Therapy Treatment  Patient Details  Name: Stephanie Serrano MRN: [id number] Date of Birth: 08-30-1966 Referring Provider (PT): Gentry Fitz, MD   Encounter Date: 09/09/2020   PT End of Session - 09/09/20 1418    Visit Number 9    Number of Visits 13    Date for PT Re-Evaluation 09/24/20    Authorization Type humana MCR    PT Start Time 1404   pt arrived late   PT Stop Time 1443    PT Time Calculation (min) 39 min    Activity Tolerance Patient tolerated treatment well    Behavior During Therapy [facility] for tasks assessed/performed           Past Medical History:  Diagnosis Date  . Allergy   . Anemia   . Anxiety   . Arthritis   . Asthma   . Blood transfusion without reported diagnosis   . Depression   . Eczema   . Esophageal stricture   . GERD (gastroesophageal reflux disease)   . Migraines   . OCD (obsessive compulsive disorder)   . RLS (restless legs syndrome)   . Vertigo     Past Surgical History:  Procedure Laterality Date  . ESOPHAGOGASTRODUODENOSCOPY N/A 02/09/2015   Procedure: ESOPHAGOGASTRODUODENOSCOPY (EGD);  Surgeon: Inda Castle, MD;  Location: [facility];  Service: Endoscopy;  Laterality: N/A;  with dilation  . FOOT SURGERY     bone spurs  . NASAL SINUS SURGERY    . PLANTAR FASCIA RELEASE Right 08/03/2016   Procedure: PLANTAR FASCIA RELEASE WITH BONE SPUR EXCISION;  Surgeon: Dorna Leitz, MD;  Location: [place];  Service: Orthopedics;  Laterality: Right;  . TONSILLECTOMY      There were no vitals filed for this visit.   Subjective Assessment - 09/09/20 1417    Subjective I could hardly walk for 2 days after our last visit because of my knee.                                     PT Education - 09/09/20 1418    Education Details TENS, MCL     Person(s) Educated Patient    Methods Explanation    Comprehension Verbalized understanding;Need further instruction            PT Short Term Goals - 08/12/20 1209      PT SHORT TERM GOAL #1   Title pt will present with neutral pelvic rotation    Baseline corrected at eval    Time 4    Period Weeks    Status New    Target Date 09/09/20      PT SHORT TERM GOAL #2   Title .             PT Long Term Goals - 08/12/20 1207      PT LONG TERM GOAL #1   Title gross LE strength to 5/5    Baseline not appropriate to test at eval due to pain levels    Time 6    Period Weeks    Status New    Target Date 09/23/20      PT LONG TERM GOAL #2   Title pt will be able to navigate stairs at home without incr SIJ pain    Baseline  pain at eval    Time 6    Period Weeks    Status New    Target Date 09/23/20      PT LONG TERM GOAL #3   Title pt will demo proper squat/lift form to support body mechanics in ADLs without incr in LBP    Baseline pain at eval    Time 6    Period Weeks    Status New    Target Date 09/23/20      PT LONG TERM GOAL #4   Title pt will be independent in long term HEP for core and lumbopelvic strength/stability    Baseline will progress as tolerated    Time 6    Period Weeks    Status New    Target Date 09/23/20      PT LONG TERM GOAL #5   Title .                 Plan - 09/09/20 1419    Clinical Impression Statement Pt requested TENS as she feels that it incr blood flow and decreases tension. 10 min of her exercises were performed in concordance with ESTIM application. Concordant pain upon palpation to Rt MCL but denied pain with stress to ligamentous structures. Cont to encourage her to avoid limping and compensations for knee at the cost of back/hip. She will do ice massage 2/day until her next appointment and we can try taping. With her recent skin irritation I do not feel that ionto would be appropriate. If she continues to have the same  level of knee pain at the end of her POC, we will d/c to return to MD and she verbalized agreement.    PT Treatment/Interventions ADLs/Self Care Home Management;Cryotherapy;Electrical Stimulation;Traction;Moist Heat;Iontophoresis 4mg /ml Dexamethasone;Gait training;Stair training;Functional mobility training;Therapeutic activities;Therapeutic exercise;Patient/family education;Neuromuscular re-education;Manual techniques;Taping;Dry needling;Passive range of motion;Spinal Manipulations;Joint Manipulations    PT Next Visit Plan KX modifiers, 10th visit PN    PT Home Exercise Plan self correction for Lt outflare, SIJ corretion at wall, [id number]    Consulted and Agree with Plan of Care Patient           Patient will benefit from skilled therapeutic intervention in order to improve the following deficits and impairments:  Improper body mechanics, Pain, Postural dysfunction, Increased muscle spasms, Decreased activity tolerance, Decreased strength, Difficulty walking  Visit Diagnosis: Chronic pain of left knee  Muscle weakness (generalized)     Problem List Patient Active Problem List   Diagnosis Date Noted  . Chest pain of uncertain etiology 09/32/3557  . Heart palpitations 09/05/2019  . Shortness of breath 09/05/2019  . Anxiety 09/05/2019  . Tachycardia 07/23/2019  . Sprain of right ankle 02/03/2019  . MDD (major depressive disorder), recurrent episode, moderate ([place]) 06/28/2017  . OCD (obsessive compulsive disorder) 03/26/2017  . Obstructive sleep apnea of adult 01/18/2017  . Insomnia 02/08/2016  . Heel spur 09/01/2015  . Family history of cystitis 08/31/2015  . Left foot pain 08/31/2015  . Prediabetes 08/23/2015  . Hyperlipidemia 08/23/2015  . Depression 03/21/2015  . Acute esophagitis 02/09/2015  . Gastroesophageal reflux disease without esophagitis 08/27/2014  . Dysphagia, unspecified(787.20) 03/22/2014    Mabry Santarelli C. Aron Needles PT, DPT 09/10/20 9:05 AM   [facility] [address] [place], [place], [address] Phone: [phone]   Fax:  [phone]  Name: Stephanie Serrano MRN: [id number] Date of Birth: [birth year]

## 2020-09-14 ENCOUNTER — Encounter: Payer: Self-pay | Admitting: Physical Therapy

## 2020-09-14 ENCOUNTER — Other Ambulatory Visit: Payer: Self-pay

## 2020-09-14 ENCOUNTER — Ambulatory Visit: Payer: Medicare HMO | Admitting: Physical Therapy

## 2020-09-14 DIAGNOSIS — M6281 Muscle weakness (generalized): Secondary | ICD-10-CM | POA: Diagnosis not present

## 2020-09-14 DIAGNOSIS — M25562 Pain in left knee: Secondary | ICD-10-CM | POA: Diagnosis not present

## 2020-09-14 DIAGNOSIS — G8929 Other chronic pain: Secondary | ICD-10-CM

## 2020-09-14 NOTE — Therapy (Signed)
[place], [place], [address] Phone: [phone]   Fax:  [phone]  Physical Therapy Treatment Progress Note Reporting Period 08/12/20 to 09/14/2020   See note below for Objective Data and Assessment of Progress/Goals.      Patient Details  Name: Stephanie Serrano MRN: [id number] Date of Birth: [birth year] Referring Provider (PT): Gentry Fitz, MD   Encounter Date: 09/14/2020   PT End of Session - 09/14/20 1104    Visit Number 10    Number of Visits 13    Date for PT Re-Evaluation 09/24/20    Authorization Type humana MCR    PT Start Time 1102    PT Stop Time 1143    PT Time Calculation (min) 41 min    Activity Tolerance Patient tolerated treatment well    Behavior During Therapy [facility] for tasks assessed/performed           Past Medical History:  Diagnosis Date  . Allergy   . Anemia   . Anxiety   . Arthritis   . Asthma   . Blood transfusion without reported diagnosis   . Depression   . Eczema   . Esophageal stricture   . GERD (gastroesophageal reflux disease)   . Migraines   . OCD (obsessive compulsive disorder)   . RLS (restless legs syndrome)   . Vertigo     Past Surgical History:  Procedure Laterality Date  . ESOPHAGOGASTRODUODENOSCOPY N/A 02/09/2015   Procedure: ESOPHAGOGASTRODUODENOSCOPY (EGD);  Surgeon: Inda Castle, MD;  Location: [facility];  Service: Endoscopy;  Laterality: N/A;  with dilation  . FOOT SURGERY     bone spurs  . NASAL SINUS SURGERY    . PLANTAR FASCIA RELEASE Right 08/03/2016   Procedure: PLANTAR FASCIA RELEASE WITH BONE SPUR EXCISION;  Surgeon: Dorna Leitz, MD;  Location: [facility];  Service: Orthopedics;  Laterality: Right;  . TONSILLECTOMY      There were no vitals filed for this visit.   Subjective Assessment - 09/14/20 1108    Subjective mild tenderness at palpation to MCL today, feeling better              [facility] PT  Assessment - 09/14/20 0001      Assessment   Medical Diagnosis bil SIJ pain    Referring Provider (PT) Gentry Fitz, MD      Precautions   Precautions None      Restrictions   Weight Bearing Restrictions No      Balance Screen   Has the patient fallen in the past 6 months No      [place] residence    Living Arrangements Children    Additional Comments first floor apt-no stairs      Prior Function   Level of Independence Independent    Vocation On disability      Cognition   Overall Cognitive Status Within Functional Limits for tasks assessed      Observation/Other Assessments   Focus on Therapeutic Outcomes (FOTO)  41% limited      Sensation   Additional Comments WFL      ROM / Strength   AROM / PROM / Strength Strength      Strength   Overall Strength Comments Lt hip flexion 4/5, otherwise gross 5/5      Palpation   Palpation comment mild TTP at Rt MCL  [facility] Adult PT Treatment/Exercise - 09/14/20 0001      Lumbar Exercises: Machines for Strengthening   Leg Press upright leg press 55lb      Lumbar Exercises: Standing   Heel Raises Limitations holding 10lb kettle bell    Other Standing Lumbar Exercises kettle bell squats      Modalities   Modalities Ultrasound      Ultrasound   Ultrasound Location Lt MCL    Ultrasound Parameters 8 min .8 w/cm2, continuous    Ultrasound Goals Pain                  PT Education - 09/14/20 1519    Education Details goals, FOTO, gym workouts    Person(s) Educated Patient    Methods Explanation;Demonstration;Tactile cues;Verbal cues    Comprehension Verbalized understanding;Returned demonstration;Verbal cues required;Tactile cues required;Need further instruction            PT Short Term Goals - 09/14/20 1105      PT SHORT TERM GOAL #1   Title pt will present with neutral pelvic rotation    Status Achieved              PT Long Term Goals - 09/14/20 1105      PT LONG TERM GOAL #1   Title gross LE strength to 5/5    Baseline see flowsheet    Status On-going      PT LONG TERM GOAL #2   Title pt will be able to navigate stairs at home without incr SIJ pain    Baseline unable to determine as she now lives in a first floor apt    Status Unable to assess      PT LONG TERM GOAL #3   Title pt will demo proper squat/lift form to support body mechanics in ADLs without incr in LBP    Baseline good form but requires some cues for consistency    Status Partially Met      PT LONG TERM GOAL #4   Title pt will be independent in long term HEP for core and lumbopelvic strength/stability    Baseline exercises are going fine, I use them when I get achey, requires further progression    Status On-going                 Plan - 09/14/20 1522    Clinical Impression Statement making progress toward goals and reported increase in FOTO score. reported feeling like she has a whole new knee after [place] today. We discussed her gym workouts and how to use functional activities as inspiration. Will complete POC and then d/c to independent workouts.    PT Treatment/Interventions ADLs/Self Care Home Management;Cryotherapy;Electrical Stimulation;Traction;Moist Heat;Iontophoresis 1m/ml Dexamethasone;Gait training;Stair training;Functional mobility training;Therapeutic activities;Therapeutic exercise;Patient/family education;Neuromuscular re-education;Manual techniques;Taping;Dry needling;Passive range of motion;Spinal Manipulations;Joint Manipulations    PT Next Visit Plan cont review of gym equipment, core strengthening, [place]    PT Home Exercise Plan self correction for Lt outflare, SIJ corretion at wall, [id number]    Consulted and Agree with Plan of Care Patient           Patient will benefit from skilled therapeutic intervention in order to improve the following deficits and impairments:  Improper body mechanics, Pain,  Postural dysfunction, Increased muscle spasms, Decreased activity tolerance, Decreased strength, Difficulty walking  Visit Diagnosis: Chronic pain of left knee  Muscle weakness (generalized)     Problem List Patient Active Problem List   Diagnosis Date Noted  . Chest  pain of uncertain etiology 68/61/6837  . Heart palpitations 09/05/2019  . Shortness of breath 09/05/2019  . Anxiety 09/05/2019  . Tachycardia 07/23/2019  . Sprain of right ankle 02/03/2019  . MDD (major depressive disorder), recurrent episode, moderate ([place]) 06/28/2017  . OCD (obsessive compulsive disorder) 03/26/2017  . Obstructive sleep apnea of adult 01/18/2017  . Insomnia 02/08/2016  . Heel spur 09/01/2015  . Family history of cystitis 08/31/2015  . Left foot pain 08/31/2015  . Prediabetes 08/23/2015  . Hyperlipidemia 08/23/2015  . Depression 03/21/2015  . Acute esophagitis 02/09/2015  . Gastroesophageal reflux disease without esophagitis 08/27/2014  . Dysphagia, unspecified(787.20) 03/22/2014    Cecil Bixby C. Iosefa Weintraub PT, DPT 09/14/20 3:26 PM   [facility] [address] [place], [place], [address] Phone: [phone]   Fax:  [phone]  Name: Claudean Leavelle MRN: [id number] Date of Birth: [birth year]

## 2020-09-15 DIAGNOSIS — R002 Palpitations: Secondary | ICD-10-CM | POA: Diagnosis not present

## 2020-09-15 DIAGNOSIS — R635 Abnormal weight gain: Secondary | ICD-10-CM | POA: Diagnosis not present

## 2020-09-15 DIAGNOSIS — N951 Menopausal and female climacteric states: Secondary | ICD-10-CM | POA: Diagnosis not present

## 2020-09-15 DIAGNOSIS — Z6834 Body mass index (BMI) 34.0-34.9, adult: Secondary | ICD-10-CM | POA: Diagnosis not present

## 2020-09-16 ENCOUNTER — Encounter: Payer: Self-pay | Admitting: Physical Therapy

## 2020-09-16 ENCOUNTER — Other Ambulatory Visit: Payer: Self-pay

## 2020-09-16 ENCOUNTER — Ambulatory Visit: Payer: Medicare HMO | Admitting: Physical Therapy

## 2020-09-16 DIAGNOSIS — M6281 Muscle weakness (generalized): Secondary | ICD-10-CM | POA: Diagnosis not present

## 2020-09-16 DIAGNOSIS — G8929 Other chronic pain: Secondary | ICD-10-CM

## 2020-09-16 DIAGNOSIS — M25562 Pain in left knee: Secondary | ICD-10-CM | POA: Diagnosis not present

## 2020-09-16 NOTE — Therapy (Signed)
[place], [place], [address] Phone: [phone]   Fax:  [phone]  Physical Therapy Treatment  Patient Details  Name: Stephanie Serrano MRN: [id number] Date of Birth: [birth year] Referring Provider (PT): Gentry Fitz, MD   Encounter Date: 09/16/2020   PT End of Session - 09/16/20 1154    Visit Number 11    Number of Visits 13    Date for PT Re-Evaluation 09/24/20    Authorization Type humana MCR    PT Start Time 1150    PT Stop Time 1230    PT Time Calculation (min) 40 min    Activity Tolerance Patient tolerated treatment well    Behavior During Therapy [facility] for tasks assessed/performed           Past Medical History:  Diagnosis Date  . Allergy   . Anemia   . Anxiety   . Arthritis   . Asthma   . Blood transfusion without reported diagnosis   . Depression   . Eczema   . Esophageal stricture   . GERD (gastroesophageal reflux disease)   . Migraines   . OCD (obsessive compulsive disorder)   . RLS (restless legs syndrome)   . Vertigo     Past Surgical History:  Procedure Laterality Date  . ESOPHAGOGASTRODUODENOSCOPY N/A 02/09/2015   Procedure: ESOPHAGOGASTRODUODENOSCOPY (EGD);  Surgeon: Inda Castle, MD;  Location: [facility];  Service: Endoscopy;  Laterality: N/A;  with dilation  . FOOT SURGERY     bone spurs  . NASAL SINUS SURGERY    . PLANTAR FASCIA RELEASE Right 08/03/2016   Procedure: PLANTAR FASCIA RELEASE WITH BONE SPUR EXCISION;  Surgeon: Dorna Leitz, MD;  Location: [facility];  Service: Orthopedics;  Laterality: Right;  . TONSILLECTOMY      There were no vitals filed for this visit.   Subjective Assessment - 09/16/20 1151    Subjective Knee is still feeling good. Everything is feeling pretty decent. Rt knee is still a little stiff sometimes but I am trying not to compensate.                             [facility] PT Treatment/Exercise  - 09/16/20 0001      Lumbar Exercises: Aerobic   Stationary Bike 5 min L3 following [place]      Lumbar Exercises: Machines for Strengthening   Cybex Knee Extension 25lb    Cybex Knee Flexion 30lb    Other Lumbar Machine Exercise fm weighted squats 10lb ea      Lumbar Exercises: Supine   Other Supine Lumbar Exercises double leg bicycle motion- cues to keep knees together    Other Supine Lumbar Exercises hundreds in 90/90      Ultrasound   Ultrasound Location Rt medial knee joint line    Ultrasound Parameters 8 min .8w/cm2 pulsed    Ultrasound Goals Pain                    PT Short Term Goals - 09/14/20 1105      PT SHORT TERM GOAL #1   Title pt will present with neutral pelvic rotation    Status Achieved             PT Long Term Goals - 09/14/20 1105      PT LONG TERM GOAL #1   Title gross LE strength to 5/5    Baseline see  flowsheet    Status On-going      PT LONG TERM GOAL #2   Title pt will be able to navigate stairs at home without incr SIJ pain    Baseline unable to determine as she now lives in a first floor apt    Status Unable to assess      PT LONG TERM GOAL #3   Title pt will demo proper squat/lift form to support body mechanics in ADLs without incr in LBP    Baseline good form but requires some cues for consistency    Status Partially Met      PT LONG TERM GOAL #4   Title pt will be independent in long term HEP for core and lumbopelvic strength/stability    Baseline exercises are going fine, I use them when I get achey, requires further progression    Status On-going                 Plan - 09/16/20 1403    Clinical Impression Statement Pt requested to use [place] on her Rt knee today as it made her Left knee feel so much better. I decreased from continuous to pulsed as she was unable to tolerate the full time on her left knee due to sound head getting too warm. Continued to review gym equipment for proper form and challenge.    PT  Treatment/Interventions ADLs/Self Care Home Management;Cryotherapy;Electrical Stimulation;Traction;Moist Heat;Iontophoresis 61m/ml Dexamethasone;Gait training;Stair training;Functional mobility training;Therapeutic activities;Therapeutic exercise;Patient/family education;Neuromuscular re-education;Manual techniques;Taping;Dry needling;Passive range of motion;Spinal Manipulations;Joint Manipulations    PT Next Visit Plan modalities PRN, any other gym machines? progress core    PT Home Exercise Plan self correction for Lt outflare, SIJ corretion at wall, [id number]    Consulted and Agree with Plan of Care Patient           Patient will benefit from skilled therapeutic intervention in order to improve the following deficits and impairments:  Improper body mechanics, Pain, Postural dysfunction, Increased muscle spasms, Decreased activity tolerance, Decreased strength, Difficulty walking  Visit Diagnosis: Chronic pain of left knee  Muscle weakness (generalized)     Problem List Patient Active Problem List   Diagnosis Date Noted  . Chest pain of uncertain etiology 162/94/7654 . Heart palpitations 09/05/2019  . Shortness of breath 09/05/2019  . Anxiety 09/05/2019  . Tachycardia 07/23/2019  . Sprain of right ankle 02/03/2019  . MDD (major depressive disorder), [place] episode, [place] 06/28/2017  . OCD (obsessive compulsive disorder) 03/26/2017  . Obstructive sleep apnea of adult 01/18/2017  . Insomnia 02/08/2016  . Heel spur 09/01/2015  . Family history of cystitis 08/31/2015  . Left foot pain 08/31/2015  . Prediabetes 08/23/2015  . Hyperlipidemia 08/23/2015  . Depression 03/21/2015  . Acute esophagitis 02/09/2015  . Gastroesophageal reflux disease without esophagitis 08/27/2014  . Dysphagia, unspecified(787.20) 03/22/2014    Stephanie Serrano C. Brooks Stotz PT, DPT 09/16/20 2:07 PM   [facility]  [address]Phone: [phone]  Fax:  [phone] Name: AIkran PatmanMRN: [id number] of Birth: [birth year]

## 2020-09-21 ENCOUNTER — Ambulatory Visit: Payer: Medicare HMO | Admitting: Physical Therapy

## 2020-09-21 ENCOUNTER — Other Ambulatory Visit: Payer: Self-pay

## 2020-09-21 ENCOUNTER — Encounter: Payer: Self-pay | Admitting: Physical Therapy

## 2020-09-21 DIAGNOSIS — G8929 Other chronic pain: Secondary | ICD-10-CM | POA: Diagnosis not present

## 2020-09-21 DIAGNOSIS — M6281 Muscle weakness (generalized): Secondary | ICD-10-CM | POA: Diagnosis not present

## 2020-09-21 DIAGNOSIS — M25562 Pain in left knee: Secondary | ICD-10-CM | POA: Diagnosis not present

## 2020-09-21 NOTE — Therapy (Signed)
[place], [place], [address] Phone: [phone]   Fax:  [phone]  Physical Therapy Treatment  Patient Details  Name: Stephanie Serrano MRN: [id number] Date of Birth: [birth year] Referring Provider (PT): Gentry Fitz, MD   Encounter Date: 09/21/2020   PT End of Session - 09/21/20 1108    Visit Number 12    Number of Visits 13    Date for PT Re-Evaluation 09/24/20    Authorization Type humana MCR    PT Start Time 1105   pt arrived late   PT Stop Time 1143    PT Time Calculation (min) 38 min    Activity Tolerance Patient tolerated treatment well    Behavior During Therapy [facility] for tasks assessed/performed           Past Medical History:  Diagnosis Date   Allergy    Anemia    Anxiety    Arthritis    Asthma    Blood transfusion without reported diagnosis    Depression    Eczema    Esophageal stricture    GERD (gastroesophageal reflux disease)    Migraines    OCD (obsessive compulsive disorder)    RLS (restless legs syndrome)    Vertigo     Past Surgical History:  Procedure Laterality Date   ESOPHAGOGASTRODUODENOSCOPY N/A 02/09/2015   Procedure: ESOPHAGOGASTRODUODENOSCOPY (EGD);  Surgeon: Inda Castle, MD;  Location: [facility];  Service: Endoscopy;  Laterality: N/A;  with dilation   FOOT SURGERY     bone spurs   NASAL SINUS SURGERY     PLANTAR FASCIA RELEASE Right 08/03/2016   Procedure: PLANTAR FASCIA RELEASE WITH BONE SPUR EXCISION;  Surgeon: Dorna Leitz, MD;  Location: [place];  Service: Orthopedics;  Laterality: Right;   TONSILLECTOMY      There were no vitals filed for this visit.   Subjective Assessment - 09/21/20 1106    Subjective Knee is feeling better since [place]. It was trying to hurt but it never quite did. Lt SIJ was hurting a lot this morning but all I can think is I did my stretches yesterday.                              [place] Adult PT Treatment/Exercise - 09/21/20 0001      Lumbar Exercises: Stretches   Active Hamstring Stretch Limitations supine AHSS, cues for form    Figure 4 Stretch Limitations figure 4 pull in- cues for positioning      Lumbar Exercises: Standing   Lifting Limitations 8lb hip hinge    Other Standing Lumbar Exercises functional squatting to floor reach with 8lb weight      Lumbar Exercises: Supine   Straight Leg Raises Limitations +ab set with arms over chest, avoid touching table      Lumbar Exercises: Prone   Opposite Arm/Leg Raise Right arm/Left leg;Left arm/Right leg;15 reps    Other Prone Lumbar Exercises prone hip extension with knee flexed                    PT Short Term Goals - 09/14/20 1105      PT SHORT TERM GOAL #1   Title pt will present with neutral pelvic rotation    Status Achieved             PT Long Term Goals - 09/14/20 1105      PT  LONG TERM GOAL #1   Title gross LE strength to 5/5    Baseline see flowsheet    Status On-going      PT LONG TERM GOAL #2   Title pt will be able to navigate stairs at home without incr SIJ pain    Baseline unable to determine as she now lives in a first floor apt    Status Unable to assess      PT LONG TERM GOAL #3   Title pt will demo proper squat/lift form to support body mechanics in ADLs without incr in LBP    Baseline good form but requires some cues for consistency    Status Partially Met      PT LONG TERM GOAL #4   Title pt will be independent in long term HEP for core and lumbopelvic strength/stability    Baseline exercises are going fine, I use them when I get achey, requires further progression    Status On-going                 Plan - 09/21/20 1244    Clinical Impression Statement reviewed the HEP she is performing at home- required technique cues for proper stretch but was able to verbalize proper sensation following. Added squat to hip hinge  for floor reaching/lifting which she was nervous about but did well.    PT Treatment/Interventions ADLs/Self Care Home Management;Cryotherapy;Electrical Stimulation;Traction;Moist Heat;Iontophoresis 4m/ml Dexamethasone;Gait training;Stair training;Functional mobility training;Therapeutic activities;Therapeutic exercise;Patient/family education;Neuromuscular re-education;Manual techniques;Taping;Dry needling;Passive range of motion;Spinal Manipulations;Joint Manipulations    PT Next Visit Plan d/c- review HEP    PT Home Exercise Plan self correction for Lt outflare, SIJ corretion at wall, [id number]    Consulted and Agree with Plan of Care Patient           Patient will benefit from skilled therapeutic intervention in order to improve the following deficits and impairments:  Improper body mechanics, Pain, Postural dysfunction, Increased muscle spasms, Decreased activity tolerance, Decreased strength, Difficulty walking  Visit Diagnosis: Chronic pain of left knee  Muscle weakness (generalized)     Problem List Patient Active Problem List   Diagnosis Date Noted   Chest pain of uncertain etiology 156/43/3295  Heart palpitations 09/05/2019   Shortness of breath 09/05/2019   Anxiety 09/05/2019   Tachycardia 07/23/2019   Sprain of right ankle 02/03/2019   MDD (major depressive disorder), recurrent episode, moderate ([place] 06/28/2017   OCD (obsessive compulsive disorder) 03/26/2017   Obstructive sleep apnea of adult 01/18/2017   Insomnia 02/08/2016   Heel spur 09/01/2015   Family history of cystitis 08/31/2015   Left foot pain 08/31/2015   Prediabetes 08/23/2015   Hyperlipidemia 08/23/2015   Depression 03/21/2015   Acute esophagitis 02/09/2015   Gastroesophageal reflux disease without esophagitis 08/27/2014   Dysphagia, unspecified(787.20) 03/22/2014    Stephanie Serrano C. Stephanie Serrano PT, DPT 09/21/20 12:46 PM   [facility] [address]Phone: [phone]  Fax:  [phone] Name: Stephanie WhanMRN: [id number] of Birth: 127-Dec-1967

## 2020-09-23 ENCOUNTER — Ambulatory Visit: Payer: Medicare HMO | Admitting: Physical Therapy

## 2020-09-23 ENCOUNTER — Encounter: Payer: Self-pay | Admitting: Physical Therapy

## 2020-09-23 ENCOUNTER — Other Ambulatory Visit: Payer: Self-pay

## 2020-09-23 DIAGNOSIS — G8929 Other chronic pain: Secondary | ICD-10-CM

## 2020-09-23 DIAGNOSIS — M25562 Pain in left knee: Secondary | ICD-10-CM | POA: Diagnosis not present

## 2020-09-23 DIAGNOSIS — M6281 Muscle weakness (generalized): Secondary | ICD-10-CM

## 2020-09-23 NOTE — Therapy (Signed)
[place], [place], [address] Phone: [phone]   Fax:  [phone]  Physical Therapy Treatment/Discharge  Patient Details  Name: Stephanie Serrano MRN: [id number] Date of Birth: Apr 16, 1966 Referring Provider (PT): Gentry Fitz, MD   Encounter Date: 09/23/2020   PT End of Session - 09/23/20 1249    Visit Number 13    Number of Visits 13    Date for PT Re-Evaluation 09/24/20    Authorization Type humana MCR    PT Start Time 1149    PT Stop Time 1227    PT Time Calculation (min) 38 min    Activity Tolerance Patient tolerated treatment well    Behavior During Therapy [facility] for tasks assessed/performed           Past Medical History:  Diagnosis Date  . Allergy   . Anemia   . Anxiety   . Arthritis   . Asthma   . Blood transfusion without reported diagnosis   . Depression   . Eczema   . Esophageal stricture   . GERD (gastroesophageal reflux disease)   . Migraines   . OCD (obsessive compulsive disorder)   . RLS (restless legs syndrome)   . Vertigo     Past Surgical History:  Procedure Laterality Date  . ESOPHAGOGASTRODUODENOSCOPY N/A 02/09/2015   Procedure: ESOPHAGOGASTRODUODENOSCOPY (EGD);  Surgeon: Inda Castle, MD;  Location: [facility];  Service: Endoscopy;  Laterality: N/A;  with dilation  . FOOT SURGERY     bone spurs  . NASAL SINUS SURGERY    . PLANTAR FASCIA RELEASE Right 08/03/2016   Procedure: PLANTAR FASCIA RELEASE WITH BONE SPUR EXCISION;  Surgeon: Dorna Leitz, MD;  Location: [place];  Service: Orthopedics;  Laterality: Right;  . TONSILLECTOMY      There were no vitals filed for this visit.   Subjective Assessment - 09/23/20 1151    Subjective My right knee is hurting. I am not sure what happened in the middle of the night but it felt like it got hit by a baseball bat. Points to Rt medial joint line. I am on Dupixent and I remember reading something  about joint pain.              [facility] PT Assessment - 09/23/20 0001      Assessment   Medical Diagnosis bil SIJ pain    Referring Provider (PT) Gentry Fitz, [place] residence    Living Arrangements Children    Additional Comments first floor apt-no stairs      Prior Function   Level of Independence Independent    Vocation On disability      Cognition   Overall Cognitive Status Within Functional Limits for tasks assessed      Observation/Other Assessments   Focus on Therapeutic Outcomes (FOTO)  44% limited today, 41% limited at best      Strength   Overall Strength Comments Lt hip flexion 4+/5, otherwise gross 5/5                         [facility] Adult PT Treatment/Exercise - 09/23/20 0001      Ultrasound   Ultrasound Location Rt medial knee joint line, rt SIJ    Ultrasound Parameters .5w/cm2 cont; ./w/cm2 cont    Ultrasound Goals Pain  PT Education - 09/23/20 1251    Education Details goals, FOTO, importance of HEP    Person(s) Educated Patient    Methods Explanation    Comprehension Verbalized understanding            PT Short Term Goals - 09/14/20 1105      PT SHORT TERM GOAL #1   Title pt will present with neutral pelvic rotation    Status Achieved             PT Long Term Goals - 09/23/20 1154      PT LONG TERM GOAL #1   Title gross LE strength to 5/5    Baseline see flowsheet    Status Partially Met      PT LONG TERM GOAL #2   Title pt will be able to navigate stairs at home without incr SIJ pain    Status Achieved      PT LONG TERM GOAL #3   Title pt will demo proper squat/lift form to support body mechanics in ADLs without incr in LBP    Status Achieved      PT LONG TERM GOAL #4   Title pt will be independent in long term HEP for core and lumbopelvic strength/stability    Status Achieved                 Plan - 09/23/20 1250     Clinical Impression Statement at this time pt has made significant progress toward goals and is prepared for management with independent HEP. She requested [place] to Rt SIJ and Rt medial knee joint line after feeling impingement last night and was performed today. encouraged her to continue with HEP consisitely and contact me with any further questions.    PT Treatment/Interventions ADLs/Self Care Home Management;Cryotherapy;Electrical Stimulation;Traction;Moist Heat;Iontophoresis 52m/ml Dexamethasone;Gait training;Stair training;Functional mobility training;Therapeutic activities;Therapeutic exercise;Patient/family education;Neuromuscular re-education;Manual techniques;Taping;Dry needling;Passive range of motion;Spinal Manipulations;Joint Manipulations    PT Home Exercise Plan self correction for Lt outflare, SIJ corretion at wall, [id number]    Consulted and Agree with Plan of Care Patient           Patient will benefit from skilled therapeutic intervention in order to improve the following deficits and impairments:  Improper body mechanics, Pain, Postural dysfunction, Increased muscle spasms, Decreased activity tolerance, Decreased strength, Difficulty walking  Visit Diagnosis: Chronic pain of left knee  Muscle weakness (generalized)     Problem List Patient Active Problem List   Diagnosis Date Noted  . Chest pain of uncertain etiology 146/27/0350 . Heart palpitations 09/05/2019  . Shortness of breath 09/05/2019  . Anxiety 09/05/2019  . Tachycardia 07/23/2019  . Sprain of right ankle 02/03/2019  . MDD (major depressive disorder), recurrent episode, moderate ([place] 06/28/2017  . OCD (obsessive compulsive disorder) 03/26/2017  . Obstructive sleep apnea of adult 01/18/2017  . Insomnia 02/08/2016  . Heel spur 09/01/2015  . Family history of cystitis 08/31/2015  . Left foot pain 08/31/2015  . Prediabetes 08/23/2015  . Hyperlipidemia 08/23/2015  . Depression 03/21/2015  . Acute  esophagitis 02/09/2015  . Gastroesophageal reflux disease without esophagitis 08/27/2014  . Dysphagia, unspecified(787.20) 03/22/2014   PHYSICAL THERAPY DISCHARGE SUMMARY  Visits from Start of Care: 13  Current functional level related to goals / functional outcomes: See above   Remaining deficits: See above   Education / Equipment: Anatomy of condition, POC, HEP, exercise form/rationale  Plan: Patient agrees to discharge.  Patient goals were partially met. Patient is being discharged due to  meeting the stated rehab goals.  ?????      Jannah Guardiola C. Rayana Geurin PT, DPT 09/23/20 12:54 PM   [facility] [address] [place], [place], [address] Phone: [phone]   Fax:  [phone]  Name: Stephanie Serrano MRN: [id number] Date of Birth: 1966-06-27

## 2021-03-10 DIAGNOSIS — L2089 Other atopic dermatitis: Secondary | ICD-10-CM | POA: Diagnosis not present

## 2021-08-03 DIAGNOSIS — M9903 Segmental and somatic dysfunction of lumbar region: Secondary | ICD-10-CM | POA: Diagnosis not present

## 2021-08-03 DIAGNOSIS — M545 Low back pain, unspecified: Secondary | ICD-10-CM | POA: Diagnosis not present

## 2021-08-03 DIAGNOSIS — M9905 Segmental and somatic dysfunction of pelvic region: Secondary | ICD-10-CM | POA: Diagnosis not present

## 2021-08-03 DIAGNOSIS — M9904 Segmental and somatic dysfunction of sacral region: Secondary | ICD-10-CM | POA: Diagnosis not present

## 2021-08-08 DIAGNOSIS — M545 Low back pain, unspecified: Secondary | ICD-10-CM | POA: Diagnosis not present

## 2021-08-08 DIAGNOSIS — M9903 Segmental and somatic dysfunction of lumbar region: Secondary | ICD-10-CM | POA: Diagnosis not present

## 2021-08-08 DIAGNOSIS — M9905 Segmental and somatic dysfunction of pelvic region: Secondary | ICD-10-CM | POA: Diagnosis not present

## 2021-08-08 DIAGNOSIS — M9904 Segmental and somatic dysfunction of sacral region: Secondary | ICD-10-CM | POA: Diagnosis not present

## 2021-08-15 DIAGNOSIS — M9903 Segmental and somatic dysfunction of lumbar region: Secondary | ICD-10-CM | POA: Diagnosis not present

## 2021-08-15 DIAGNOSIS — M9905 Segmental and somatic dysfunction of pelvic region: Secondary | ICD-10-CM | POA: Diagnosis not present

## 2021-08-15 DIAGNOSIS — M9904 Segmental and somatic dysfunction of sacral region: Secondary | ICD-10-CM | POA: Diagnosis not present

## 2021-08-15 DIAGNOSIS — M545 Low back pain, unspecified: Secondary | ICD-10-CM | POA: Diagnosis not present

## 2021-08-17 DIAGNOSIS — M9904 Segmental and somatic dysfunction of sacral region: Secondary | ICD-10-CM | POA: Diagnosis not present

## 2021-08-17 DIAGNOSIS — M9905 Segmental and somatic dysfunction of pelvic region: Secondary | ICD-10-CM | POA: Diagnosis not present

## 2021-08-17 DIAGNOSIS — M9903 Segmental and somatic dysfunction of lumbar region: Secondary | ICD-10-CM | POA: Diagnosis not present

## 2021-08-17 DIAGNOSIS — M545 Low back pain, unspecified: Secondary | ICD-10-CM | POA: Diagnosis not present

## 2021-08-21 DIAGNOSIS — M9903 Segmental and somatic dysfunction of lumbar region: Secondary | ICD-10-CM | POA: Diagnosis not present

## 2021-08-21 DIAGNOSIS — M545 Low back pain, unspecified: Secondary | ICD-10-CM | POA: Diagnosis not present

## 2021-08-21 DIAGNOSIS — M9904 Segmental and somatic dysfunction of sacral region: Secondary | ICD-10-CM | POA: Diagnosis not present

## 2021-08-21 DIAGNOSIS — M9905 Segmental and somatic dysfunction of pelvic region: Secondary | ICD-10-CM | POA: Diagnosis not present

## 2021-08-23 DIAGNOSIS — M545 Low back pain, unspecified: Secondary | ICD-10-CM | POA: Diagnosis not present

## 2021-08-23 DIAGNOSIS — M9903 Segmental and somatic dysfunction of lumbar region: Secondary | ICD-10-CM | POA: Diagnosis not present

## 2021-08-23 DIAGNOSIS — M9905 Segmental and somatic dysfunction of pelvic region: Secondary | ICD-10-CM | POA: Diagnosis not present

## 2021-08-23 DIAGNOSIS — M9904 Segmental and somatic dysfunction of sacral region: Secondary | ICD-10-CM | POA: Diagnosis not present

## 2021-08-24 ENCOUNTER — Encounter: Payer: Self-pay | Admitting: Gastroenterology

## 2021-08-28 DIAGNOSIS — M545 Low back pain, unspecified: Secondary | ICD-10-CM | POA: Diagnosis not present

## 2021-08-28 DIAGNOSIS — M9904 Segmental and somatic dysfunction of sacral region: Secondary | ICD-10-CM | POA: Diagnosis not present

## 2021-08-28 DIAGNOSIS — M9905 Segmental and somatic dysfunction of pelvic region: Secondary | ICD-10-CM | POA: Diagnosis not present

## 2021-08-28 DIAGNOSIS — M9903 Segmental and somatic dysfunction of lumbar region: Secondary | ICD-10-CM | POA: Diagnosis not present

## 2021-08-31 DIAGNOSIS — M9905 Segmental and somatic dysfunction of pelvic region: Secondary | ICD-10-CM | POA: Diagnosis not present

## 2021-08-31 DIAGNOSIS — M9903 Segmental and somatic dysfunction of lumbar region: Secondary | ICD-10-CM | POA: Diagnosis not present

## 2021-08-31 DIAGNOSIS — M545 Low back pain, unspecified: Secondary | ICD-10-CM | POA: Diagnosis not present

## 2021-08-31 DIAGNOSIS — M9904 Segmental and somatic dysfunction of sacral region: Secondary | ICD-10-CM | POA: Diagnosis not present

## 2021-09-05 DIAGNOSIS — M9905 Segmental and somatic dysfunction of pelvic region: Secondary | ICD-10-CM | POA: Diagnosis not present

## 2021-09-05 DIAGNOSIS — M9903 Segmental and somatic dysfunction of lumbar region: Secondary | ICD-10-CM | POA: Diagnosis not present

## 2021-09-05 DIAGNOSIS — M9904 Segmental and somatic dysfunction of sacral region: Secondary | ICD-10-CM | POA: Diagnosis not present

## 2021-09-05 DIAGNOSIS — M545 Low back pain, unspecified: Secondary | ICD-10-CM | POA: Diagnosis not present

## 2021-09-11 DIAGNOSIS — M9904 Segmental and somatic dysfunction of sacral region: Secondary | ICD-10-CM | POA: Diagnosis not present

## 2021-09-11 DIAGNOSIS — M9903 Segmental and somatic dysfunction of lumbar region: Secondary | ICD-10-CM | POA: Diagnosis not present

## 2021-09-11 DIAGNOSIS — M545 Low back pain, unspecified: Secondary | ICD-10-CM | POA: Diagnosis not present

## 2021-09-11 DIAGNOSIS — M9905 Segmental and somatic dysfunction of pelvic region: Secondary | ICD-10-CM | POA: Diagnosis not present

## 2021-09-14 DIAGNOSIS — M545 Low back pain, unspecified: Secondary | ICD-10-CM | POA: Diagnosis not present

## 2021-09-14 DIAGNOSIS — M9904 Segmental and somatic dysfunction of sacral region: Secondary | ICD-10-CM | POA: Diagnosis not present

## 2021-09-14 DIAGNOSIS — M9905 Segmental and somatic dysfunction of pelvic region: Secondary | ICD-10-CM | POA: Diagnosis not present

## 2021-09-14 DIAGNOSIS — M9903 Segmental and somatic dysfunction of lumbar region: Secondary | ICD-10-CM | POA: Diagnosis not present

## 2021-09-18 ENCOUNTER — Other Ambulatory Visit: Payer: Self-pay

## 2021-09-18 ENCOUNTER — Ambulatory Visit (INDEPENDENT_AMBULATORY_CARE_PROVIDER_SITE_OTHER): Payer: Medicare HMO | Admitting: Podiatry

## 2021-09-18 ENCOUNTER — Ambulatory Visit (INDEPENDENT_AMBULATORY_CARE_PROVIDER_SITE_OTHER): Payer: Medicare HMO

## 2021-09-18 DIAGNOSIS — M79672 Pain in left foot: Secondary | ICD-10-CM

## 2021-09-18 DIAGNOSIS — M778 Other enthesopathies, not elsewhere classified: Secondary | ICD-10-CM

## 2021-10-01 NOTE — Progress Notes (Signed)
   HPI: 55 y.o. female presenting today as a reestablish new patient for evaluation of bilateral foot pain this been going on for approximately 1-2 months.  Gradual onset.  She states that she has been dealing with pain on the top of her foot.  She denies a history of injury.  She presents for further treatment and evaluation of  Past Medical History:  Diagnosis Date   Allergy    Anemia    Anxiety    Arthritis    Asthma    Blood transfusion without reported diagnosis    Depression    Eczema    Esophageal stricture    GERD (gastroesophageal reflux disease)    Migraines    OCD (obsessive compulsive disorder)    RLS (restless legs syndrome)    Vertigo      Physical Exam: General: The patient is alert and oriented x3 in no acute distress.  Dermatology: Skin is warm, dry and supple bilateral lower extremities. Negative for open lesions or macerations.  Vascular: Palpable pedal pulses bilaterally. No edema or erythema noted. Capillary refill within normal limits.  Neurological: Epicritic and protective threshold grossly intact bilaterally.   Musculoskeletal Exam: Range of motion within normal limits to all pedal and ankle joints bilateral. Muscle strength 5/5 in all groups bilateral.  There is some pain on palpation throughout the bilateral midtarsal joints  Radiographic Exam:  Normal osseous mineralization. Joint spaces preserved. No fracture/dislocation/boney destruction.    Assessment: 1.  Capsulitis bilateral midfoot/midtarsal joints   Plan of Care:  1. Patient evaluated. X-Rays reviewed.  2.  Recommend OTC power step insoles to support the arches and provide relief from the midfoot 3.  We did not prescribe any prescriptions or anti-inflammatories nor give any steroid injections due to the patient being on chronic injections of Dupixent 4.  Return to clinic as needed      Edrick Kins, DPM [facility]  Dr. Edrick Kins, DPM    [address], [place] [address]                Office [phone]  Fax [phone]

## 2021-11-14 DIAGNOSIS — M79671 Pain in right foot: Secondary | ICD-10-CM | POA: Diagnosis not present

## 2021-11-14 DIAGNOSIS — M79672 Pain in left foot: Secondary | ICD-10-CM | POA: Diagnosis not present

## 2021-11-29 DIAGNOSIS — M19071 Primary osteoarthritis, right ankle and foot: Secondary | ICD-10-CM | POA: Diagnosis not present

## 2021-12-07 DIAGNOSIS — M79671 Pain in right foot: Secondary | ICD-10-CM | POA: Diagnosis not present

## 2021-12-13 DIAGNOSIS — M19071 Primary osteoarthritis, right ankle and foot: Secondary | ICD-10-CM | POA: Diagnosis not present

## 2021-12-13 DIAGNOSIS — M79671 Pain in right foot: Secondary | ICD-10-CM | POA: Diagnosis not present

## 2022-01-04 DIAGNOSIS — M89371 Hypertrophy of bone, right ankle and foot: Secondary | ICD-10-CM | POA: Diagnosis not present

## 2022-01-04 DIAGNOSIS — G8918 Other acute postprocedural pain: Secondary | ICD-10-CM | POA: Diagnosis not present

## 2022-01-04 DIAGNOSIS — M19071 Primary osteoarthritis, right ankle and foot: Secondary | ICD-10-CM | POA: Diagnosis not present

## 2022-01-05 DIAGNOSIS — M79671 Pain in right foot: Secondary | ICD-10-CM | POA: Diagnosis not present

## 2022-01-19 DIAGNOSIS — M19071 Primary osteoarthritis, right ankle and foot: Secondary | ICD-10-CM | POA: Diagnosis not present

## 2022-02-05 DIAGNOSIS — M79671 Pain in right foot: Secondary | ICD-10-CM | POA: Diagnosis not present

## 2022-02-16 DIAGNOSIS — Z9889 Other specified postprocedural states: Secondary | ICD-10-CM | POA: Diagnosis not present

## 2022-02-16 DIAGNOSIS — M19071 Primary osteoarthritis, right ankle and foot: Secondary | ICD-10-CM | POA: Diagnosis not present

## 2022-02-22 DIAGNOSIS — Z1231 Encounter for screening mammogram for malignant neoplasm of breast: Secondary | ICD-10-CM | POA: Diagnosis not present

## 2022-02-23 ENCOUNTER — Other Ambulatory Visit: Payer: Self-pay

## 2022-02-23 ENCOUNTER — Ambulatory Visit: Payer: Medicare HMO | Attending: Orthopaedic Surgery

## 2022-02-23 DIAGNOSIS — R2689 Other abnormalities of gait and mobility: Secondary | ICD-10-CM | POA: Diagnosis not present

## 2022-02-23 DIAGNOSIS — M25562 Pain in left knee: Secondary | ICD-10-CM | POA: Insufficient documentation

## 2022-02-23 DIAGNOSIS — M6281 Muscle weakness (generalized): Secondary | ICD-10-CM | POA: Insufficient documentation

## 2022-02-23 DIAGNOSIS — G8929 Other chronic pain: Secondary | ICD-10-CM | POA: Insufficient documentation

## 2022-02-23 DIAGNOSIS — M25674 Stiffness of right foot, not elsewhere classified: Secondary | ICD-10-CM | POA: Insufficient documentation

## 2022-02-23 NOTE — Therapy (Addendum)
?OUTPATIENT PHYSICAL THERAPY LOWER EXTREMITY EVALUATION ? ? ?Patient Name: Stephanie Serrano ?MRN: [id number] ?DOB:[birth year], 56 y.o., female ?Today's Date: 02/25/2022 ? ? 02/23/22 1307  ?PT Visits / Re-Eval  ?Visit Number 1  ?Number of Visits 8  ?Date for PT Re-Evaluation 03/23/22  ?Authorization  ?Authorization Type [employer]  ?PT Time Calculation  ?PT Start Time 1300  ?PT Stop Time 5573  ?PT Time Calculation (min) 45 min  ?PT - End of Session  ?Activity Tolerance Patient tolerated treatment well  ?Behavior During Therapy [facility] for tasks assessed/performed  ? ? ? ? ?Past Medical History:  ?Diagnosis Date  ? Allergy   ? Anemia   ? Anxiety   ? Arthritis   ? Asthma   ? Blood transfusion without reported diagnosis   ? Depression   ? Eczema   ? Esophageal stricture   ? GERD (gastroesophageal reflux disease)   ? Migraines   ? OCD (obsessive compulsive disorder)   ? RLS (restless legs syndrome)   ? Vertigo   ? ?Past Surgical History:  ?Procedure Laterality Date  ? ESOPHAGOGASTRODUODENOSCOPY N/A 02/09/2015  ? Procedure: ESOPHAGOGASTRODUODENOSCOPY (EGD);  Surgeon: Inda Castle, MD;  Location: [facility];  Service: Endoscopy;  Laterality: N/A;  with dilation  ? FOOT SURGERY    ? bone spurs  ? NASAL SINUS SURGERY    ? PLANTAR FASCIA RELEASE Right 08/03/2016  ? Procedure: PLANTAR FASCIA RELEASE WITH BONE SPUR EXCISION;  Surgeon: Dorna Leitz, MD;  Location: [place];  Service: Orthopedics;  Laterality: Right;  ? TONSILLECTOMY    ? ?Patient Active Problem List  ? Diagnosis Date Noted  ? Chest pain of uncertain etiology 22/01/5426  ? Heart palpitations 09/05/2019  ? Shortness of breath 09/05/2019  ? Anxiety 09/05/2019  ? Tachycardia 07/23/2019  ? Sprain of right ankle 02/03/2019  ? MDD (major depressive disorder), recurrent episode, moderate ([place]) 06/28/2017  ? OCD (obsessive compulsive disorder) 03/26/2017  ? Obstructive sleep apnea of adult 01/18/2017  ? Insomnia 02/08/2016  ? Heel spur 09/01/2015  ? Family history  of cystitis 08/31/2015  ? Left foot pain 08/31/2015  ? Prediabetes 08/23/2015  ? Hyperlipidemia 08/23/2015  ? Depression 03/21/2015  ? Acute esophagitis 02/09/2015  ? Gastroesophageal reflux disease without esophagitis 08/27/2014  ? Dysphagia, unspecified(787.20) 03/22/2014  ? ? ?PCP: Glenis Smoker, MD ? ?REFERRING PROVIDER: Erle Crocker, MD ? ?REFERRING DIAG: right midfoot arthritis -surgery ;right second tarsal matatarsal joint arthrodeses with resection of  ? ?THERAPY DIAG:  ?Muscle weakness (generalized) ? ?Other abnormalities of gait and mobility ? ?Stiffness of right foot, not elsewhere classified ? ?ONSET DATE: 02/19/2022  ? ?SUBJECTIVE:  ? ?SUBJECTIVE STATEMENT: ?Reports minimal to no pain following recent midfoot surgery ? ?PERTINENT HISTORY: ?None noted ? ?PAIN:  ?Are you having pain? No ? ?PRECAUTIONS: Other: 50% WB in CAM boot ? ?WEIGHT BEARING RESTRICTIONS Yes 50% in boot ? ?FALLS:  ?Has patient fallen in last 6 months? Yes, Number of falls: 3 ? ?LIVING ENVIRONMENT: ?Lives with: lives with their family ?Lives in: House/apartment ? ? ?OCCUPATION: educator ? ?PLOF: Independent ? ?PATIENT GOALS To return to ambulation and work duties ? ? ?OBJECTIVE:  ? ?DIAGNOSTIC FINDINGS: none noted ? ?PATIENT SURVEYS:  ?FOTO 24 ? ?COGNITION: ? Overall cognitive status: Within functional limits for tasks assessed   ?  ?SENSATION: ?WFL ? ? ?POSTURE:  ?UTA due to WB status  ? ?PALPATION: ?Tender to scar tissue  ? ?LE ROM: ? ?Active ROM Right ?02/25/2022 Left ?02/25/2022  ?  Hip flexion WNL WNL  ?Hip extension WNL WNL  ?Hip abduction WNL WNL  ?Hip adduction    ?Hip internal rotation    ?Hip external rotation    ?Knee flexion WNL WNL  ?Knee extension WNL WNL  ?Ankle dorsiflexion 7/10d   ?Ankle plantarflexion WFL   ?Ankle inversion WFL   ?Ankle eversion WFL   ? (Blank rows = not tested) ? ?LE MMT: ? ?MMT Right ?02/25/2022 Left ?02/25/2022  ?Hip flexion    ?Hip extension    ?Hip abduction    ?Hip adduction    ?Hip  internal rotation    ?Hip external rotation    ?Knee flexion    ?Knee extension    ?Ankle dorsiflexion *3+ 5  ?Ankle plantarflexion *3+ 5  ?Ankle inversion *3+ 5  ?Ankle eversion *3+ 5  ? (* tested in NWB) ? ?LOWER EXTREMITY SPECIAL TESTS:  ?N/a post-op ? ?FUNCTIONAL TESTS:  ?Deferred due to WB status ? ?GAIT: ?Distance walked: non ambulatory, knee scooter ?Assistive device utilized:  scooter ?Level of assistance: Complete Independence ? ? ? ? ?TODAY'S TREATMENT: ?Eval and HEP ? ? ?PATIENT EDUCATION:  ?Education details: Discussed eval findings, rehab rationale and POC and patient is in agreement ?Person educated: Patient and Child(ren) ?Education method: Explanation, Demonstration, and Handouts ?Education comprehension: verbalized understanding, returned demonstration, and needs further education ? ? ?HOME EXERCISE PROGRAM: ?Access Code: [email or web address] ?URL: [email or web address] ?Date: 02/23/2022 ?Prepared by: Sharlynn Oliphant ? ?Exercises ?Seated Heel Raise - 2 x daily - 7 x weekly - 2 sets - 15 reps ?Seated Toe Raise - 2 x daily - 7 x weekly - 2 sets - 15 reps ?Supine Ankle Circles - 2 x daily - 7 x weekly - 2 sets - 15 reps ?Towel Scrunches - 2 x daily - 7 x weekly - 2 sets - 15 reps ?Seated Great Toe Extension - 2 x daily - 7 x weekly - 2 sets - 15 reps ?Seated Lesser Toes Extension - 2 x daily - 7 x weekly - 2 sets - 15 reps ? ? ?ASSESSMENT: ? ?CLINICAL IMPRESSION: ?Patient is a 56 y.o. female who was seen today for physical therapy evaluation and treatment for R foot dysfunction following midfoot surgery and fusion.  ? ? ?OBJECTIVE IMPAIRMENTS Abnormal gait, decreased balance, decreased knowledge of use of DME, decreased mobility, difficulty walking, decreased ROM, and decreased strength.  ? ?ACTIVITY LIMITATIONS community activity, driving, and school.  ? ?REHAB POTENTIAL: Good ? ?CLINICAL DECISION MAKING: Stable/uncomplicated ? ?EVALUATION COMPLEXITY: Low ? ? ?GOALS: ?Goals reviewed with patient?  Yes ? ?SHORT TERM GOALS: Target date: 03/11/2022 ? ?Patient to demonstrate independence in HEP  ?Baseline: XA'8MG'$ HLF ?Goal status: INITIAL ? ?2.  Assess gait with appropriate AD and WB status ?Baseline: 50% WB using knee scooter ?Goal status: INITIAL ? ? ? ?LONG TERM GOALS: Target date: 03/25/2022 ? ?Increase FOTO score to 56 ?Baseline: 33 ?Goal status: INITIAL ? ?2.  Patient to ambulate 570f with LRAD And appropriate gait pattern ?Baseline: TBD ?Goal status: INITIAL ? ?3.  Increase R ankle strength to 4/5 globally ?Baseline: 3+/5 globally ?Goal status: INITIAL ? ?4.  Increase R DF to 12d AROM ?Baseline: 8d AROM ?Goal status: INITIAL ? ? ? ? ?PLAN: ?PT FREQUENCY: 1-2x/week ? ?PT DURATION: 4 weeks ? ?PLANNED INTERVENTIONS: Therapeutic exercises, Therapeutic activity, Neuromuscular re-education, Balance training, Gait training, Patient/Family education, Joint mobilization, Stair training, DME instructions, and Manual therapy ? ?PLAN FOR NEXT SESSION: Gait training, HEP, aerobic conditioning, ankle strength and ROM ? ? ?  Lanice Shirts, PT ?02/25/2022, 3:44 PM  ? ?Referring diagnosis? right midfoot arthritis -surgery ;right second tarsal matatarsal joint arthrodeses with resection of  ?Treatment diagnosis? (if different than referring diagnosis) Muscle weakness (generalized) ? ?Other abnormalities of gait and mobility ? ?Stiffness of right foot, not elsewhere classified ?What was this (referring dx) caused by? ?'[x]'$  Surgery ?'[]'$  Fall ?'[]'$  Ongoing issue ?'[]'$  Arthritis ?'[]'$  Other: ____________ ? ?Laterality: ?'[x]'$  Rt ?'[]'$  Lt ?'[]'$  Both ? ?Check all possible CPT codes:  *CHOOSE 10 OR LESS*    ?'[x]'$  97110 (Therapeutic Exercise)  '[]'$  92507 (SLP Treatment)  ?'[x]'$  [id number] (Neuro Re-ed)   '[]'$  92526 (Swallowing Treatment)  ? '[x]'$  [id number] (Gait Training)   '[]'$  [id number] (Cognitive Training, 1st 15 minutes) ?'[x]'$  97140 (Manual Therapy)   '[]'$  97130 (Cognitive Training, each add'l 15 minutes)  ?'[x]'$  07622 (Therapeutic Activities)  '[]'$  Other, List CPT Code  ____________    ?'[]'$  63335 (Self Care)      ? '[]'$  All codes above (97110 - 97535) ? '[]'$  [id number] (Mechanical Traction) ? '[]'$  97014 (E-stim Unattended) ? '[]'$  97032 (E-stim manual) ? '[]'$  [phone] ([place]) ? '[]'$  63893 Roderick Pee

## 2022-02-24 NOTE — Therapy (Addendum)
?OUTPATIENT PHYSICAL THERAPY TREATMENT NOTE/DC SUMMARY ? ? ?Patient Name: Stephanie Serrano ?MRN: [id number] ?DOB:January 08, 1966, 56 y.o., female ?Today's Date: 02/27/2022 ? ?PCP: Glenis Smoker, MD ?REFERRING PROVIDER: Glenis Smoker, * ?PHYSICAL THERAPY DISCHARGE SUMMARY ? ?Visits from Start of Care: 2 ? ?Current functional level related to goals / functional outcomes: ?UTA ?  ?Remaining deficits: ?ROM, strength ?  ?Education / Equipment: ?HEP  ? ?Patient agrees to discharge. Patient goals were not met. Patient is being discharged due to not returning since the last visit.  ? ? PT End of Session - 02/27/22 1702   ? ? Visit Number 2   ? Number of Visits 8   ? Date for PT Re-Evaluation 03/23/22   ? Authorization Type [employer] MCR   ? Authorization Time Period FOTO v6, v10   ? Progress Note Due on Visit 10   ? PT Start Time 1702   ? PT Stop Time 1017   ? PT Time Calculation (min) 43 min   ? Activity Tolerance Patient tolerated treatment well   ? Behavior During Therapy [facility] for tasks assessed/performed   ? ?  ?  ? ?  ? ? ?Past Medical History:  ?Diagnosis Date  ? Allergy   ? Anemia   ? Anxiety   ? Arthritis   ? Asthma   ? Blood transfusion without reported diagnosis   ? Depression   ? Eczema   ? Esophageal stricture   ? GERD (gastroesophageal reflux disease)   ? Migraines   ? OCD (obsessive compulsive disorder)   ? RLS (restless legs syndrome)   ? Vertigo   ? ?Past Surgical History:  ?Procedure Laterality Date  ? ESOPHAGOGASTRODUODENOSCOPY N/A 02/09/2015  ? Procedure: ESOPHAGOGASTRODUODENOSCOPY (EGD);  Surgeon: Inda Castle, MD;  Location: [facility];  Service: Endoscopy;  Laterality: N/A;  with dilation  ? FOOT SURGERY    ? bone spurs  ? NASAL SINUS SURGERY    ? PLANTAR FASCIA RELEASE Right 08/03/2016  ? Procedure: PLANTAR FASCIA RELEASE WITH BONE SPUR EXCISION;  Surgeon: Dorna Leitz, MD;  Location: [place];  Service: Orthopedics;  Laterality: Right;  ? TONSILLECTOMY    ? ?Patient Active  Problem List  ? Diagnosis Date Noted  ? Chest pain of uncertain etiology 51/01/5851  ? Heart palpitations 09/05/2019  ? Shortness of breath 09/05/2019  ? Anxiety 09/05/2019  ? Tachycardia 07/23/2019  ? Sprain of right ankle 02/03/2019  ? MDD (major depressive disorder), recurrent episode, moderate ([place]) 06/28/2017  ? OCD (obsessive compulsive disorder) 03/26/2017  ? Obstructive sleep apnea of adult 01/18/2017  ? Insomnia 02/08/2016  ? Heel spur 09/01/2015  ? Family history of cystitis 08/31/2015  ? Left foot pain 08/31/2015  ? Prediabetes 08/23/2015  ? Hyperlipidemia 08/23/2015  ? Depression 03/21/2015  ? Acute esophagitis 02/09/2015  ? Gastroesophageal reflux disease without esophagitis 08/27/2014  ? Dysphagia, unspecified(787.20) 03/22/2014  ? ? ?REFERRING DIAG: right midfoot arthritis -surgery ;right second tarsal matatarsal joint arthrodeses with resection of  ? ?THERAPY DIAG:  ?Muscle weakness (generalized) ? ?Other abnormalities of gait and mobility ? ?Stiffness of right foot, not elsewhere classified ? ?PERTINENT HISTORY: Rt hallux lapidus bunionectomy in 2006; BIL plantar fascia release in 2016/17; BIL bone spur removal in 2017; Rt mid-joint bone spur removal 01/04/2022 ? ?PRECAUTIONS: Other: Full WB in CAM boot ? ?SUBJECTIVE: Pt reports continued low-level Rt foot pain today, although she reports doing her HEP daily. ? ?PAIN:  ?Are you having pain? Yes: NPRS scale: 2/10 ?Pain  location: Rt dorsal 1st ray ?Pain description: achy ?Aggravating factors: walking ?Relieving factors: rest ? ? ? ? ? ?OBJECTIVE:  ? *Unless otherwise noted, objective information collected previously* ? ?DIAGNOSTIC FINDINGS: none noted ?  ?PATIENT SURVEYS:  ?FOTO 72 ?  ?COGNITION: ?          Overall cognitive status: Within functional limits for tasks assessed               ?           ?SENSATION: ?WFL ?  ?  ?POSTURE:  ?UTA due to WB status  ?  ?PALPATION: ?Tender to scar tissue  ?  ?LE ROM: ?  ?Active ROM Right ?02/23/2022  Left ?02/23/2022  ?Hip flexion WNL WNL  ?Hip extension WNL WNL  ?Hip abduction WNL WNL  ?Hip adduction      ?Hip internal rotation      ?Hip external rotation      ?Knee flexion WNL WNL  ?Knee extension WNL WNL  ?Ankle dorsiflexion 7/10d    ?Ankle plantarflexion WFL    ?Ankle inversion WFL    ?Ankle eversion WFL    ? (Blank rows = not tested) ?  ?LE MMT: ?  ?MMT Right ?02/23/2022 Left ?02/23/2022  ?Hip flexion      ?Hip extension      ?Hip abduction      ?Hip adduction      ?Hip internal rotation      ?Hip external rotation      ?Knee flexion      ?Knee extension      ?Ankle dorsiflexion *3+ 5  ?Ankle plantarflexion *3+ 5  ?Ankle inversion *3+ 5  ?Ankle eversion *3+ 5  ? (Blank rows = not tested) ?  ?LOWER EXTREMITY SPECIAL TESTS:  ?N/a post-op ?  ?FUNCTIONAL TESTS:  ?Deferred due to WB status ?  ?GAIT: ?Distance walked: non ambulatory, knee scooter ?Assistive device utilized:  scooter ?Level of assistance: Complete Independence ?  ?  ?  ?  ?TODAY'S TREATMENT: ?[facility] Adult PT Treatment:                                                DATE: 02/27/2022 ?Therapeutic Exercise:  ?Seated BAPS board Rt ankle rotation without letting edge of BAPS board touch ground; 2x10 clockwise and counter-clockwise ?Seated forward/ backward Rt ankle rolls on each side of BOSU ball 2x10 ?Rt ankle inversion/ eversion towel scrunches 2x5 each ?Seated ankle rocking with handhold resistance through knees 2x10 BIL ?Manual Therapy: ?N/A ?Neuromuscular re-ed: ?N/A ?Therapeutic Activity: ?N/A ?Modalities: ?N/A ?Self Care: ?N/A ? ?  ?  ?PATIENT EDUCATION:  ?Education details: Education on importance of continued HEP adherence ?Person educated: Patient ?Education method: Explanation ?Education comprehension: verbalized understanding ?  ?  ?HOME EXERCISE PROGRAM: ?Access Code: [email or web address] ?URL: [email or web address] ?Date: 02/23/2022 ?Prepared by: Sharlynn Oliphant ?  ?Exercises ?Seated Heel Raise - 2 x daily - 7 x weekly - 2 sets - 15  reps ?Seated Toe Raise - 2 x daily - 7 x weekly - 2 sets - 15 reps ?Supine Ankle Circles - 2 x daily - 7 x weekly - 2 sets - 15 reps ?Towel Scrunches - 2 x daily - 7 x weekly - 2 sets - 15 reps ?Seated Great Toe Extension - 2 x daily - 7 x weekly - 2 sets - 15 reps ?Seated  Lesser Toes Extension - 2 x daily - 7 x weekly - 2 sets - 15 reps ?  ?  ?ASSESSMENT: ?  ?CLINICAL IMPRESSION: ?Pt responded well to all interventions today, demonstrating good form and no increase in pain with selected exercises. She will continue to benefit from skilled PT for graded exposure to ankle strengthening/ mobility exercises.  ?  ?  ?OBJECTIVE IMPAIRMENTS Abnormal gait, decreased balance, decreased knowledge of use of DME, decreased mobility, difficulty walking, decreased ROM, and decreased strength.  ?  ?ACTIVITY LIMITATIONS community activity, driving, and school.  ?  ?REHAB POTENTIAL: Good ?  ?CLINICAL DECISION MAKING: Stable/uncomplicated ?  ?EVALUATION COMPLEXITY: Low ?  ?  ?GOALS: ?Goals reviewed with patient? Yes ?  ?SHORT TERM GOALS: Target date: 03/09/2022 ?  ?Patient to demonstrate independence in HEP  ?Baseline: [id number] ?Goal status: INITIAL ?  ?2.  Assess gait with appropriate AD and WB status ?Baseline: 50% WB using knee scooter ?Goal status: INITIAL ?  ?  ?  ?LONG TERM GOALS: Target date: 03/23/2022 ?  ?Increase FOTO score to 56 ?Baseline: 33 ?Goal status: INITIAL ?  ?2.  Patient to ambulate 576f with LRAD And appropriate gait pattern ?Baseline: TBD ?Goal status: INITIAL ?  ?3.  Increase R ankle strength to 4/5 globally ?Baseline: 3+/5 globally ?Goal status: INITIAL ?  ?4.  Increase R DF to 12d AROM ?Baseline: 8d AROM ?Goal status: INITIAL ?  ?  ?  ?  ?PLAN: ?PT FREQUENCY: 1-2x/week ?  ?PT DURATION: 4 weeks ?  ?PLANNED INTERVENTIONS: Therapeutic exercises, Therapeutic activity, Neuromuscular re-education, Balance training, Gait training, Patient/Family education, Joint mobilization, Stair training, DME instructions, and  Manual therapy ?  ?PLAN FOR NEXT SESSION: Gait training, HEP, aerobic conditioning, ankle strength and ROM ? ? ? ?YVanessa Windsor PT, DPT ?02/27/22 5:47 PM ? ? ?  ? ?

## 2022-02-27 ENCOUNTER — Ambulatory Visit: Payer: Medicare HMO

## 2022-02-27 ENCOUNTER — Other Ambulatory Visit: Payer: Self-pay

## 2022-02-27 DIAGNOSIS — M25674 Stiffness of right foot, not elsewhere classified: Secondary | ICD-10-CM | POA: Diagnosis not present

## 2022-02-27 DIAGNOSIS — R2689 Other abnormalities of gait and mobility: Secondary | ICD-10-CM | POA: Diagnosis not present

## 2022-02-27 DIAGNOSIS — M6281 Muscle weakness (generalized): Secondary | ICD-10-CM | POA: Diagnosis not present

## 2022-02-27 DIAGNOSIS — M25562 Pain in left knee: Secondary | ICD-10-CM | POA: Diagnosis not present

## 2022-02-27 DIAGNOSIS — G8929 Other chronic pain: Secondary | ICD-10-CM | POA: Diagnosis not present

## 2022-02-28 NOTE — Therapy (Incomplete)
?OUTPATIENT PHYSICAL THERAPY TREATMENT NOTE ? ? ?Patient Name: Stephanie Serrano ?MRN: [id number] ?DOB:08/22/1966, 56 y.o., female ?Today's Date: 02/28/2022 ? ?PCP: Glenis Smoker, MD ?REFERRING PROVIDER: Glenis Smoker, * ? ? ? ? ?Past Medical History:  ?Diagnosis Date  ? Allergy   ? Anemia   ? Anxiety   ? Arthritis   ? Asthma   ? Blood transfusion without reported diagnosis   ? Depression   ? Eczema   ? Esophageal stricture   ? GERD (gastroesophageal reflux disease)   ? Migraines   ? OCD (obsessive compulsive disorder)   ? RLS (restless legs syndrome)   ? Vertigo   ? ?Past Surgical History:  ?Procedure Laterality Date  ? ESOPHAGOGASTRODUODENOSCOPY N/A 02/09/2015  ? Procedure: ESOPHAGOGASTRODUODENOSCOPY (EGD);  Surgeon: Inda Castle, MD;  Location: [facility];  Service: Endoscopy;  Laterality: N/A;  with dilation  ? FOOT SURGERY    ? bone spurs  ? NASAL SINUS SURGERY    ? PLANTAR FASCIA RELEASE Right 08/03/2016  ? Procedure: PLANTAR FASCIA RELEASE WITH BONE SPUR EXCISION;  Surgeon: Dorna Leitz, MD;  Location: [place];  Service: Orthopedics;  Laterality: Right;  ? TONSILLECTOMY    ? ?Patient Active Problem List  ? Diagnosis Date Noted  ? Chest pain of uncertain etiology 17/49/4496  ? Heart palpitations 09/05/2019  ? Shortness of breath 09/05/2019  ? Anxiety 09/05/2019  ? Tachycardia 07/23/2019  ? Sprain of right ankle 02/03/2019  ? MDD (major depressive disorder), recurrent episode, moderate ([place]) 06/28/2017  ? OCD (obsessive compulsive disorder) 03/26/2017  ? Obstructive sleep apnea of adult 01/18/2017  ? Insomnia 02/08/2016  ? Heel spur 09/01/2015  ? Family history of cystitis 08/31/2015  ? Left foot pain 08/31/2015  ? Prediabetes 08/23/2015  ? Hyperlipidemia 08/23/2015  ? Depression 03/21/2015  ? Acute esophagitis 02/09/2015  ? Gastroesophageal reflux disease without esophagitis 08/27/2014  ? Dysphagia, unspecified(787.20) 03/22/2014  ? ? ?REFERRING DIAG: right midfoot arthritis  -surgery ;right second tarsal matatarsal joint arthrodeses with resection of  ? ?THERAPY DIAG:  ?No diagnosis found. ? ?PERTINENT HISTORY: Rt hallux lapidus bunionectomy in 2006; BIL plantar fascia release in 2016/17; BIL bone spur removal in 2017; Rt mid-joint bone spur removal 01/04/2022 ? ?PRECAUTIONS: Other: Full WB in CAM boot ? ?SUBJECTIVE: *** ? ?PAIN:  ?Are you having pain? Yes: NPRS scale: 2/10 ?Pain location: Rt dorsal 1st ray ?Pain description: achy ?Aggravating factors: walking ?Relieving factors: rest ? ? ? ? ? ?OBJECTIVE:  ? *Unless otherwise noted, objective information collected previously* ? ?DIAGNOSTIC FINDINGS: none noted ?  ?PATIENT SURVEYS:  ?FOTO 90 ?  ?COGNITION: ?          Overall cognitive status: Within functional limits for tasks assessed               ?           ?SENSATION: ?WFL ?  ?  ?POSTURE:  ?UTA due to WB status  ?  ?PALPATION: ?Tender to scar tissue  ?  ?LE ROM: ?  ?Active ROM Right ?02/23/2022 Left ?02/23/2022  ?Hip flexion WNL WNL  ?Hip extension WNL WNL  ?Hip abduction WNL WNL  ?Hip adduction      ?Hip internal rotation      ?Hip external rotation      ?Knee flexion WNL WNL  ?Knee extension WNL WNL  ?Ankle dorsiflexion 7/10d    ?Ankle plantarflexion WFL    ?Ankle inversion WFL    ?Ankle eversion WFL    ? (  Blank rows = not tested) ?  ?LE MMT: ?  ?MMT Right ?02/23/2022 Left ?02/23/2022  ?Hip flexion      ?Hip extension      ?Hip abduction      ?Hip adduction      ?Hip internal rotation      ?Hip external rotation      ?Knee flexion      ?Knee extension      ?Ankle dorsiflexion *3+ 5  ?Ankle plantarflexion *3+ 5  ?Ankle inversion *3+ 5  ?Ankle eversion *3+ 5  ? (Blank rows = not tested) ?  ?LOWER EXTREMITY SPECIAL TESTS:  ?N/a post-op ?  ?FUNCTIONAL TESTS:  ?Deferred due to WB status ?  ?GAIT: ?Distance walked: non ambulatory, knee scooter ?Assistive device utilized:  scooter ?Level of assistance: Complete Independence ?  ?  ?  ?  ?TODAY'S TREATMENT: ? ?[facility] Adult PT Treatment:                                                 DATE: 03/01/2022 ?Therapeutic Exercise: ?*** ?Manual Therapy: ?*** ?Neuromuscular re-ed: ?*** ?Therapeutic Activity: ?*** ?Modalities: ?*** ?Self Care: ?*** ? ? ?[facility] Adult PT Treatment:                                                DATE: 02/27/2022 ?Therapeutic Exercise:  ?Seated BAPS board Rt ankle rotation without letting edge of BAPS board touch ground; 2x10 clockwise and counter-clockwise ?Seated forward/ backward Rt ankle rolls on each side of BOSU ball 2x10 ?Rt ankle inversion/ eversion towel scrunches 2x5 each ?Seated ankle rocking with handhold resistance through knees 2x10 BIL ?Manual Therapy: ?N/A ?Neuromuscular re-ed: ?N/A ?Therapeutic Activity: ?N/A ?Modalities: ?N/A ?Self Care: ?N/A ? ?  ?  ?PATIENT EDUCATION:  ?Education details: Education on importance of continued HEP adherence ?Person educated: Patient ?Education method: Explanation ?Education comprehension: verbalized understanding ?  ?  ?HOME EXERCISE PROGRAM: ?Access Code: [email or web address] ?URL: [email or web address] ?Date: 02/23/2022 ?Prepared by: Sharlynn Oliphant ?  ?Exercises ?Seated Heel Raise - 2 x daily - 7 x weekly - 2 sets - 15 reps ?Seated Toe Raise - 2 x daily - 7 x weekly - 2 sets - 15 reps ?Supine Ankle Circles - 2 x daily - 7 x weekly - 2 sets - 15 reps ?Towel Scrunches - 2 x daily - 7 x weekly - 2 sets - 15 reps ?Seated Great Toe Extension - 2 x daily - 7 x weekly - 2 sets - 15 reps ?Seated Lesser Toes Extension - 2 x daily - 7 x weekly - 2 sets - 15 reps ?  ?  ?ASSESSMENT: ?  ?CLINICAL IMPRESSION: ?*** ?  ?OBJECTIVE IMPAIRMENTS Abnormal gait, decreased balance, decreased knowledge of use of DME, decreased mobility, difficulty walking, decreased ROM, and decreased strength.  ?  ?ACTIVITY LIMITATIONS community activity, driving, and school.  ?  ?REHAB POTENTIAL: Good ?  ?CLINICAL DECISION MAKING: Stable/uncomplicated ?  ?EVALUATION COMPLEXITY: Low ?  ?  ?GOALS: ?Goals reviewed with patient?  Yes ?  ?SHORT TERM GOALS: Target date: 03/09/2022 ?  ?Patient to demonstrate independence in HEP  ?Baseline: XA'8MG'$ HLF ?Goal status: INITIAL ?  ?2.  Assess gait with appropriate AD and WB status ?Baseline: 50%  WB using knee scooter ?Goal status: INITIAL ?  ?  ?  ?LONG TERM GOALS: Target date: 03/23/2022 ?  ?Increase FOTO score to 56 ?Baseline: 33 ?Goal status: INITIAL ?  ?2.  Patient to ambulate 540f with LRAD And appropriate gait pattern ?Baseline: TBD ?Goal status: INITIAL ?  ?3.  Increase R ankle strength to 4/5 globally ?Baseline: 3+/5 globally ?Goal status: INITIAL ?  ?4.  Increase R DF to 12d AROM ?Baseline: 8d AROM ?Goal status: INITIAL ?  ?  ?  ?  ?PLAN: ?PT FREQUENCY: 1-2x/week ?  ?PT DURATION: 4 weeks ?  ?PLANNED INTERVENTIONS: Therapeutic exercises, Therapeutic activity, Neuromuscular re-education, Balance training, Gait training, Patient/Family education, Joint mobilization, Stair training, DME instructions, and Manual therapy ?  ?PLAN FOR NEXT SESSION: Gait training, HEP, aerobic conditioning, ankle strength and ROM ? ? ? ?YVanessa York PT, DPT ?02/28/22 11:03 AM ? ? ?  ? ?

## 2022-03-01 ENCOUNTER — Ambulatory Visit: Payer: Medicare HMO

## 2022-03-05 ENCOUNTER — Ambulatory Visit: Payer: Medicare HMO

## 2022-03-05 DIAGNOSIS — M79671 Pain in right foot: Secondary | ICD-10-CM | POA: Diagnosis not present

## 2022-03-07 ENCOUNTER — Ambulatory Visit: Payer: Medicare HMO

## 2022-03-07 DIAGNOSIS — Z9889 Other specified postprocedural states: Secondary | ICD-10-CM | POA: Diagnosis not present

## 2022-03-07 DIAGNOSIS — M19071 Primary osteoarthritis, right ankle and foot: Secondary | ICD-10-CM | POA: Diagnosis not present

## 2022-03-12 ENCOUNTER — Ambulatory Visit: Payer: Medicare HMO

## 2022-03-14 DIAGNOSIS — H9313 Tinnitus, bilateral: Secondary | ICD-10-CM | POA: Diagnosis not present

## 2022-03-14 DIAGNOSIS — H93293 Other abnormal auditory perceptions, bilateral: Secondary | ICD-10-CM | POA: Diagnosis not present

## 2022-03-15 DIAGNOSIS — L309 Dermatitis, unspecified: Secondary | ICD-10-CM | POA: Diagnosis not present

## 2022-03-22 DIAGNOSIS — M25562 Pain in left knee: Secondary | ICD-10-CM | POA: Diagnosis not present

## 2022-03-26 DIAGNOSIS — M25572 Pain in left ankle and joints of left foot: Secondary | ICD-10-CM | POA: Diagnosis not present

## 2022-03-26 DIAGNOSIS — M25562 Pain in left knee: Secondary | ICD-10-CM | POA: Diagnosis not present

## 2022-03-26 DIAGNOSIS — Z9889 Other specified postprocedural states: Secondary | ICD-10-CM | POA: Diagnosis not present

## 2022-03-26 DIAGNOSIS — M19071 Primary osteoarthritis, right ankle and foot: Secondary | ICD-10-CM | POA: Diagnosis not present

## 2022-03-27 DIAGNOSIS — L301 Dyshidrosis [pompholyx]: Secondary | ICD-10-CM | POA: Diagnosis not present

## 2022-03-27 DIAGNOSIS — M79642 Pain in left hand: Secondary | ICD-10-CM | POA: Diagnosis not present

## 2022-03-27 DIAGNOSIS — Z79899 Other long term (current) drug therapy: Secondary | ICD-10-CM | POA: Diagnosis not present

## 2022-03-27 DIAGNOSIS — M2550 Pain in unspecified joint: Secondary | ICD-10-CM | POA: Diagnosis not present

## 2022-03-27 DIAGNOSIS — M79641 Pain in right hand: Secondary | ICD-10-CM | POA: Diagnosis not present

## 2022-03-27 DIAGNOSIS — L409 Psoriasis, unspecified: Secondary | ICD-10-CM | POA: Diagnosis not present

## 2022-03-27 DIAGNOSIS — M1990 Unspecified osteoarthritis, unspecified site: Secondary | ICD-10-CM | POA: Diagnosis not present

## 2022-03-27 DIAGNOSIS — M25562 Pain in left knee: Secondary | ICD-10-CM | POA: Diagnosis not present

## 2022-03-27 DIAGNOSIS — M79671 Pain in right foot: Secondary | ICD-10-CM | POA: Diagnosis not present

## 2022-03-27 DIAGNOSIS — M79672 Pain in left foot: Secondary | ICD-10-CM | POA: Diagnosis not present

## 2022-03-27 DIAGNOSIS — L4050 Arthropathic psoriasis, unspecified: Secondary | ICD-10-CM | POA: Diagnosis not present

## 2022-03-27 DIAGNOSIS — M25561 Pain in right knee: Secondary | ICD-10-CM | POA: Diagnosis not present

## 2022-03-29 DIAGNOSIS — M1990 Unspecified osteoarthritis, unspecified site: Secondary | ICD-10-CM | POA: Diagnosis not present

## 2022-04-09 DIAGNOSIS — M79671 Pain in right foot: Secondary | ICD-10-CM | POA: Diagnosis not present

## 2022-04-18 DIAGNOSIS — M25571 Pain in right ankle and joints of right foot: Secondary | ICD-10-CM | POA: Diagnosis not present

## 2022-04-18 DIAGNOSIS — M25572 Pain in left ankle and joints of left foot: Secondary | ICD-10-CM | POA: Diagnosis not present

## 2022-04-18 DIAGNOSIS — M79672 Pain in left foot: Secondary | ICD-10-CM | POA: Diagnosis not present

## 2022-04-18 DIAGNOSIS — M79671 Pain in right foot: Secondary | ICD-10-CM | POA: Diagnosis not present

## 2022-04-24 DIAGNOSIS — M2550 Pain in unspecified joint: Secondary | ICD-10-CM | POA: Diagnosis not present

## 2022-04-24 DIAGNOSIS — L409 Psoriasis, unspecified: Secondary | ICD-10-CM | POA: Diagnosis not present

## 2022-04-24 DIAGNOSIS — Z79899 Other long term (current) drug therapy: Secondary | ICD-10-CM | POA: Diagnosis not present

## 2022-04-24 DIAGNOSIS — M1990 Unspecified osteoarthritis, unspecified site: Secondary | ICD-10-CM | POA: Diagnosis not present

## 2022-04-24 DIAGNOSIS — L4050 Arthropathic psoriasis, unspecified: Secondary | ICD-10-CM | POA: Diagnosis not present

## 2022-04-24 DIAGNOSIS — L301 Dyshidrosis [pompholyx]: Secondary | ICD-10-CM | POA: Diagnosis not present

## 2022-05-10 DIAGNOSIS — M79671 Pain in right foot: Secondary | ICD-10-CM | POA: Diagnosis not present

## 2022-05-21 DIAGNOSIS — M25571 Pain in right ankle and joints of right foot: Secondary | ICD-10-CM | POA: Diagnosis not present

## 2022-05-22 ENCOUNTER — Other Ambulatory Visit: Payer: Self-pay | Admitting: Orthopaedic Surgery

## 2022-05-22 DIAGNOSIS — M79671 Pain in right foot: Secondary | ICD-10-CM

## 2022-05-31 DIAGNOSIS — H01005 Unspecified blepharitis left lower eyelid: Secondary | ICD-10-CM | POA: Diagnosis not present

## 2022-05-31 DIAGNOSIS — H01004 Unspecified blepharitis left upper eyelid: Secondary | ICD-10-CM | POA: Diagnosis not present

## 2022-06-07 ENCOUNTER — Other Ambulatory Visit: Payer: Self-pay | Admitting: Orthopaedic Surgery

## 2022-06-07 ENCOUNTER — Other Ambulatory Visit: Payer: Medicare HMO

## 2022-06-07 ENCOUNTER — Inpatient Hospital Stay
Admission: RE | Admit: 2022-06-07 | Discharge: 2022-06-07 | Disposition: A | Discharge status: Home / Self Care | Payer: Medicare Other | Source: Ambulatory Visit | Attending: Orthopaedic Surgery | Admitting: Orthopaedic Surgery

## 2022-06-07 DIAGNOSIS — M7989 Other specified soft tissue disorders: Secondary | ICD-10-CM | POA: Diagnosis not present

## 2022-06-07 DIAGNOSIS — M79671 Pain in right foot: Secondary | ICD-10-CM

## 2022-06-15 DIAGNOSIS — Z0000 Encounter for general adult medical examination without abnormal findings: Secondary | ICD-10-CM | POA: Diagnosis not present

## 2022-06-15 DIAGNOSIS — E785 Hyperlipidemia, unspecified: Secondary | ICD-10-CM | POA: Diagnosis not present

## 2022-06-15 DIAGNOSIS — L301 Dyshidrosis [pompholyx]: Secondary | ICD-10-CM | POA: Diagnosis not present

## 2022-06-15 DIAGNOSIS — L739 Follicular disorder, unspecified: Secondary | ICD-10-CM | POA: Diagnosis not present

## 2022-06-15 DIAGNOSIS — Z79899 Other long term (current) drug therapy: Secondary | ICD-10-CM | POA: Diagnosis not present

## 2022-06-19 DIAGNOSIS — Z79899 Other long term (current) drug therapy: Secondary | ICD-10-CM | POA: Diagnosis not present

## 2022-06-19 DIAGNOSIS — E785 Hyperlipidemia, unspecified: Secondary | ICD-10-CM | POA: Diagnosis not present

## 2022-06-27 DIAGNOSIS — M25571 Pain in right ankle and joints of right foot: Secondary | ICD-10-CM | POA: Diagnosis not present

## 2022-07-13 DIAGNOSIS — L4050 Arthropathic psoriasis, unspecified: Secondary | ICD-10-CM | POA: Diagnosis not present

## 2022-07-13 DIAGNOSIS — Z1159 Encounter for screening for other viral diseases: Secondary | ICD-10-CM | POA: Diagnosis not present

## 2022-07-13 DIAGNOSIS — M1990 Unspecified osteoarthritis, unspecified site: Secondary | ICD-10-CM | POA: Diagnosis not present

## 2022-07-19 DIAGNOSIS — L409 Psoriasis, unspecified: Secondary | ICD-10-CM | POA: Diagnosis not present

## 2022-07-19 DIAGNOSIS — Z79899 Other long term (current) drug therapy: Secondary | ICD-10-CM | POA: Diagnosis not present

## 2022-07-19 DIAGNOSIS — L659 Nonscarring hair loss, unspecified: Secondary | ICD-10-CM | POA: Diagnosis not present

## 2022-07-19 DIAGNOSIS — M1990 Unspecified osteoarthritis, unspecified site: Secondary | ICD-10-CM | POA: Diagnosis not present

## 2022-07-19 DIAGNOSIS — L301 Dyshidrosis [pompholyx]: Secondary | ICD-10-CM | POA: Diagnosis not present

## 2022-09-10 DIAGNOSIS — H04203 Unspecified epiphora, bilateral lacrimal glands: Secondary | ICD-10-CM | POA: Diagnosis not present

## 2022-10-02 DIAGNOSIS — Z79899 Other long term (current) drug therapy: Secondary | ICD-10-CM | POA: Diagnosis not present

## 2022-10-08 DIAGNOSIS — I700 Atherosclerosis of aorta: Secondary | ICD-10-CM | POA: Diagnosis not present

## 2022-10-08 DIAGNOSIS — M545 Low back pain, unspecified: Secondary | ICD-10-CM | POA: Diagnosis not present

## 2022-10-08 DIAGNOSIS — E785 Hyperlipidemia, unspecified: Secondary | ICD-10-CM | POA: Diagnosis not present

## 2022-12-21 ENCOUNTER — Other Ambulatory Visit: Payer: Self-pay

## 2023-04-10 ENCOUNTER — Encounter (INDEPENDENT_AMBULATORY_CARE_PROVIDER_SITE_OTHER): Payer: Medicare HMO | Admitting: Family Medicine

## 2023-05-08 ENCOUNTER — Encounter (INDEPENDENT_AMBULATORY_CARE_PROVIDER_SITE_OTHER): Payer: Medicare HMO | Admitting: Family Medicine

## 2024-01-14 NOTE — Progress Notes (Signed)
 This encounter was created in error - please disregard.

## 2024-05-12 ENCOUNTER — Encounter: Payer: Self-pay | Admitting: Physical Therapy

## 2024-05-12 ENCOUNTER — Other Ambulatory Visit: Payer: Self-pay

## 2024-05-12 ENCOUNTER — Ambulatory Visit: Attending: Family Medicine | Admitting: Physical Therapy

## 2024-05-12 DIAGNOSIS — M6281 Muscle weakness (generalized): Secondary | ICD-10-CM | POA: Insufficient documentation

## 2024-05-12 DIAGNOSIS — M546 Pain in thoracic spine: Secondary | ICD-10-CM | POA: Diagnosis present

## 2024-05-12 NOTE — Therapy (Signed)
 OUTPATIENT PHYSICAL THERAPY SHOULDER EVALUATION   Patient Name: Lakelyn Straus MRN: [id number] DOB:1965-12-22, 58 y.o., female Today's Date: 05/13/2024   PT End of Session - 05/12/24 1412     Visit Number 1    Number of Visits --   1-2x/week   Date for PT Re-Evaluation 07/07/24    Authorization Type [employer] MCR - NDI    PT Start Time 1330    PT Stop Time 1410    PT Time Calculation (min) 40 min             Past Medical History:  Diagnosis Date   Allergy    Anemia    Anxiety    Arthritis    Asthma    Blood transfusion without reported diagnosis    Depression    Eczema    Esophageal stricture    GERD (gastroesophageal reflux disease)    Migraines    OCD (obsessive compulsive disorder)    RLS (restless legs syndrome)    Vertigo    Past Surgical History:  Procedure Laterality Date   ESOPHAGOGASTRODUODENOSCOPY N/A 02/09/2015   Procedure: ESOPHAGOGASTRODUODENOSCOPY (EGD);  Surgeon: Claudette Cue, MD;  Location: [facility];  Service: Endoscopy;  Laterality: N/A;  with dilation   FOOT SURGERY     bone spurs   NASAL SINUS SURGERY     PLANTAR FASCIA RELEASE Right 08/03/2016   Procedure: PLANTAR FASCIA RELEASE WITH BONE SPUR EXCISION;  Surgeon: Neil Balls, MD;  Location: [facility];  Service: Orthopedics;  Laterality: Right;   TONSILLECTOMY     Patient Active Problem List   Diagnosis Date Noted   Chest pain of uncertain etiology 10/14/2019   Heart palpitations 09/05/2019   Shortness of breath 09/05/2019   Anxiety 09/05/2019   Tachycardia 07/23/2019   Sprain of right ankle 02/03/2019   MDD (major depressive disorder), recurrent episode, moderate ([facility]) 06/28/2017   OCD (obsessive compulsive disorder) 03/26/2017   Obstructive sleep apnea of adult 01/18/2017   Insomnia 02/08/2016   Heel spur 09/01/2015   Family history of cystitis 08/31/2015   Left foot pain 08/31/2015   Prediabetes 08/23/2015   Hyperlipidemia 08/23/2015   Depression 03/21/2015    Acute esophagitis 02/09/2015   Gastroesophageal reflux disease without esophagitis 08/27/2014   Dysphagia 03/22/2014    PCP: Ransom Byers, MD  REFERRING PROVIDER: Mariana Shipper, MD  THERAPY DIAG:  Pain in thoracic spine  Muscle weakness  REFERRING DIAG: Radiculitis of left cervical region [M54.12]   Rationale for Evaluation and Treatment:  Rehabilitation  SUBJECTIVE:  PERTINENT PAST HISTORY:  Dysphagia, depression, prediabetes, OCD, depression, anxiety        PRECAUTIONS: None  WEIGHT BEARING RESTRICTIONS No  FALLS:  Has patient fallen in last 6 months? No, Number of falls: 0  MOI/History of condition:  Onset date: May 10th  SUBJECTIVE STATEMENT  Dewana Ammirati is a 58 y.o. female who presents to clinic with chief complaint of L medial periscapular pain.  This started in early May.  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  The pain goes away completely if she rests.  She denies any n/t or pain into the L UE.  She did not have any specific trauma though she does report that she tried on bras shortly before the pain started which may have contributed to her sxs.   Red flags:  denies   Pain:  Are you having pain? 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   OBJECTIVE:   DIAGNOSTIC FINDINGS:  Depression, OCD, anxiety  GENERAL OBSERVATION: Rounded shoulder     SENSATION: Light touch: Appears intact   PALPATION: TTP medial periscapular musculature mid trap/rhomboid   Cervical ROM  ROM ROM  (Eval)  Flexion 40 pulling  Extension 30*  Right lateral  flexion 40  Left lateral flexion 40  Right rotation 60  Left rotation 60  Flexion rotation (normal is 30 degrees)   Flexion rotation (normal is 30 degrees)     (Blank rows = not tested, N = WNL, * = concordant pain)  UPPER EXTREMITY MMT:  MMT Right (Eval) Left (Eval)  Shoulder flexion    Shoulder abduction (C5)    Shoulder ER 4+ 4*  Shoulder IR    Middle trapezius 4 3+*  Lower trapezius 4 3+*  Shoulder extension 4+ 4+*  Grip strength    Shoulder shrug (C4)    Elbow flexion (C6)    Elbow ext (C7)    Thumb ext (C8)    Finger abd (T1)    Grossly     (Blank rows = not tested, score listed is out of 5 possible points.  N = WNL, D = diminished, C = clear for gross weakness with myotome testing, * = concordant pain with testing)   SPECIAL TESTS:  Spurlings (-)   PATIENT SURVEYS:  NDI: 23/50    TODAY'S TREATMENT:  Therapeutic Exercise: Creating, reviewing, and completing below HEP    PATIENT EDUCATION (/HM):  POC, diagnosis, prognosis, HEP, and outcome measures.  Pt educated via explanation, demonstration, and handout (HEP).  Pt confirms understanding verbally.    HOME EXERCISE PROGRAM: Access Code: [email or web address] URL: [email or web address] Date: 05/12/2024 Prepared by: Lesleigh Rash  Exercises - Shoulder Rolls in Sitting  - 1 x daily - 7 x weekly - 3 sets - 10 reps - Sidelying Thoracic Rotation with Open Book  - 1 x daily - 7 x weekly - 1 sets - 20 reps - 2 hold - Seated Levator Scapulae Stretch  - 1 x daily - 7 x weekly - 3 reps - 45 second hold  Treatment priorities   Eval        Manual and TPR mid trap/rhomboid         Progressive periscapular stretching                                  ASSESSMENT:  CLINICAL IMPRESSION: Charmin is a 58 y.o. female who presents to clinic with signs and sxs consistent with L sided medial periscapular pain.  Consistent with myofacial origin with point tenderness and no reproduction with cervical movements.   She will benefit from skilled PT to address relevant deficits and improve comfort with daily tasks including work, lifting, and driving.   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[place], driving  PERSONAL FACTORS: See medical history and pertinent history   REHAB POTENTIAL: Good  CLINICAL DECISION MAKING: Evolving/moderate complexity  EVALUATION COMPLEXITY: Moderate   GOALS:   SHORT TERM GOALS: Target date: 06/10/2024  Ethie will  be >75% HEP compliant to improve carryover between sessions and facilitate independent management of condition  Evaluation: ongoing Goal status: INITIAL   LONG TERM GOALS: Target date: 07/08/2024   Cinthya will self report >/= 50% decrease in pain from evaluation to improve function in daily tasks  Evaluation/Baseline: 8/10 max pain Goal status: INITIAL   2.  Braylea will show a >/= 10 pt improvement in their NDI score (MCID ~20% or 10/50 pts) as a proxy for functional improvement  Evaluation/Baseline: 23/50 pts Goal status: INITIAL   3.  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  Shawnelle will improve the following MMTs to >/= 4/5 to show improvement in strength:  mid and lower trap (L)   Evaluation/Baseline: see chart in note Goal status: INITIAL    PLAN: PT FREQUENCY: 1-2x/week  PT DURATION: 8 weeks  PLANNED INTERVENTIONS:  [id number]- PT Re-evaluation, 97110-Therapeutic exercises, 97530- Therapeutic activity, [id number]- Neuromuscular re-education, 97535- Self Care, 16109- Manual therapy, [id number]- Gait training, [id number]- Aquatic Therapy, [phone]- Electrical stimulation (manual), [id number]- Vasopneumatic device, [id number]- Traction (mechanical), [id number]- Ionotophoresis 4mg /ml Dexamethasone , Taping, Dry Needling, Joint manipulation, and Spinal manipulation.   Jonique Kulig PT,  DPT 05/13/2024, 8:08 AM

## 2024-05-25 ENCOUNTER — Encounter: Payer: Self-pay | Admitting: Physical Therapy

## 2024-05-25 ENCOUNTER — Ambulatory Visit: Admitting: Physical Therapy

## 2024-05-25 DIAGNOSIS — M546 Pain in thoracic spine: Secondary | ICD-10-CM | POA: Diagnosis not present

## 2024-05-25 DIAGNOSIS — M6281 Muscle weakness (generalized): Secondary | ICD-10-CM

## 2024-05-25 NOTE — Therapy (Signed)
 OUTPATIENT PHYSICAL THERAPY SHOULDER DAILY NOTE   Patient Name: Stephanie Serrano MRN: [id number] DOB:11-09-1966, 58 y.o., female Today's Date: 05/25/2024   PT End of Session - 05/25/24 1337     Visit Number 2    Date for PT Re-Evaluation 07/07/24    Authorization Type UHC MCR - NDI    PT Start Time 1330    PT Stop Time 1415    PT Time Calculation (min) 45 min    Activity Tolerance Patient tolerated treatment well;Patient limited by pain    Behavior During Therapy WFL for tasks assessed/performed           Past Medical History:  Diagnosis Date   Allergy    Anemia    Anxiety    Arthritis    Asthma    Blood transfusion without reported diagnosis    Depression    Eczema    Esophageal stricture    GERD (gastroesophageal reflux disease)    Migraines    OCD (obsessive compulsive disorder)    RLS (restless legs syndrome)    Vertigo    Past Surgical History:  Procedure Laterality Date   ESOPHAGOGASTRODUODENOSCOPY N/A 02/09/2015   Procedure: ESOPHAGOGASTRODUODENOSCOPY (EGD);  Surgeon: Claudette Cue, MD;  Location: [facility];  Service: Endoscopy;  Laterality: N/A;  with dilation   FOOT SURGERY     bone spurs   NASAL SINUS SURGERY     PLANTAR FASCIA RELEASE Right 08/03/2016   Procedure: PLANTAR FASCIA RELEASE WITH BONE SPUR EXCISION;  Surgeon: Neil Balls, MD;  Location: [facility];  Service: Orthopedics;  Laterality: Right;   TONSILLECTOMY     Patient Active Problem List   Diagnosis Date Noted   Chest pain of uncertain etiology 10/14/2019   Heart palpitations 09/05/2019   Shortness of breath 09/05/2019   Anxiety 09/05/2019   Tachycardia 07/23/2019   Sprain of right ankle 02/03/2019   MDD (major depressive disorder), recurrent episode, moderate (HCC) 06/28/2017   OCD (obsessive compulsive disorder) 03/26/2017   Obstructive sleep apnea of adult 01/18/2017   Insomnia 02/08/2016   Heel spur 09/01/2015   Family history of cystitis 08/31/2015   Left  foot pain 08/31/2015   Prediabetes 08/23/2015   Hyperlipidemia 08/23/2015   Depression 03/21/2015   Acute esophagitis 02/09/2015   Gastroesophageal reflux disease without esophagitis 08/27/2014   Dysphagia 03/22/2014    PCP: Ransom Byers, MD  REFERRING PROVIDER: Ransom Byers, *  THERAPY DIAG:  Pain in thoracic spine  Muscle weakness  REFERRING DIAG: Radiculitis of left cervical region [M54.12]   Rationale for Evaluation and Treatment:  Rehabilitation  SUBJECTIVE:  PERTINENT PAST HISTORY:  Dysphagia, depression, prediabetes, OCD, depression, anxiety        PRECAUTIONS: None  WEIGHT BEARING RESTRICTIONS No  FALLS:  Has patient fallen in last 6 months? No, Number of falls: 0  MOI/History of condition:  Onset date: May 10th  SUBJECTIVE STATEMENT   My pain 4/10 in my left shoulder in my back.  I have had a history of strange symptoms since 2020. I have had high cortisol. I would like to do dry needling.    EVAL- Stephanie Serrano is a 58 y.o. female who presents to clinic with chief complaint of L medial periscapular pain.  This started in early May.  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  The pain goes away completely if she rests.  She denies any n/t or pain into the  L UE.  She did not have any specific trauma though she does report that she tried on bras shortly before the pain started which may have contributed to her sxs.   Red flags:  denies   Pain:  Are you having pain? 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  Assistive Device: na  Hand Dominance: R  Patient Goals/Specific Activities: pain, lifting kids at work   OBJECTIVE:   DIAGNOSTIC FINDINGS:   Depression, OCD, anxiety  GENERAL OBSERVATION: Rounded shoulder     SENSATION: Light touch: Appears intact   PALPATION: TTP medial periscapular musculature mid trap/rhomboid   Cervical ROM  ROM ROM  (Eval)  Flexion 40 pulling  Extension 30*  Right lateral flexion 40  Left lateral flexion 40  Right rotation 60  Left rotation 60  Flexion rotation (normal is 30 degrees)   Flexion rotation (normal is 30 degrees)     (Blank rows = not tested, N = WNL, * = concordant pain)  UPPER EXTREMITY MMT:  MMT Right (Eval) Left (Eval)  Shoulder flexion    Shoulder abduction (C5)    Shoulder ER 4+ 4*  Shoulder IR    Middle trapezius 4 3+*  Lower trapezius 4 3+*  Shoulder extension 4+ 4+*  Grip strength    Shoulder shrug (C4)    Elbow flexion (C6)    Elbow ext (C7)    Thumb ext (C8)    Finger abd (T1)    Grossly     (Blank rows = not tested, score listed is out of 5 possible points.  N = WNL, D = diminished, C = clear for gross weakness with myotome testing, * = concordant pain with testing)   SPECIAL TESTS:  Spurlings (-)   PATIENT SURVEYS:  NDI: 23/50    TODAY'S TREATMENT:  [facility] Adult PT Treatment:                                                DATE: 05-25-24 Therapeutic Exercise: Quadriped with thread the needle and thoracic stretch elbow to ceiling Whip rotation with  seated elbow elevated hands on head Shoulder Rolls in Sitting  20 x Sidelying Thoracic Rotation with Open Book  20 reps - 2 sec  hold LS stretch on left and right  3 x with 20 sec hold Supine Cervical Retraction with Towel 10 reps 10 sec hold Star pattern  with Red T band  3 x 10 reps Shoulder External Rotation and Scapular Retraction with RTB  3 x 10 Manual Therapy: STW left UT,LS rhomboids thoracic paraspinals C-3 to C-6 left UPA Cervical distraction with cavitation Left rib mobs Trigger Point Dry Needling  Initial Treatment: Pt instructed on Dry Needling rational, procedures, and possible  side effects. Pt instructed to expect mild to moderate muscle soreness later in the day and/or into the next day.  Pt instructed in methods to reduce muscle soreness. Pt instructed to continue prescribed HEP. Because Dry Needling was performed over or adjacent to a lung field, pt was educated on S/S of pneumothorax and to seek immediate medical attention should they occur.  Patient was educated on signs  and symptoms of infection and other risk factors and advised to seek medical attention should they occur.  Patient verbalized understanding of these instructions and education.   Patient Verbal Consent Given: Yes Education Handout Provided: Yes Muscles Treated:  left side onle UT, LS, Rhomboid, thoracic paraspinals, Electrical Stimulation Performed: No Treatment Response/Outcome: muscle tension release and decrease in pain   Self Care: Relaxation techniques and use of heat at home Sitting and standing posture and sleeping with pillow positioning   EVAL Therapeutic Exercise: Creating, reviewing, and completing below HEP    PATIENT EDUCATION ([place]/HM):  POC, diagnosis, prognosis, HEP, and outcome measures.  Pt educated via explanation, demonstration, and handout (HEP).  Pt confirms understanding verbally.    HOME EXERCISE PROGRAM: Access Code: [email or web address]  updated URL: [email or web address] Date: 05/25/2024 Prepared by: Sharlet Dawson  Program Notes Add 1)Quadriped with 1)thread the needle 2) thoracic stretch elbow to ceilingAdd Whip rotation with elbow elevated hands on head  Exercises - Shoulder Rolls in Sitting  - 1 x daily - 7 x weekly - 3 sets - 10 reps - Sidelying Thoracic Rotation with Open Book  - 1 x daily - 7 x weekly - 1 sets - 20 reps - 2 hold - Seated Levator Scapulae Stretch  - 1 x daily - 7 x weekly - 3 reps - 45 second hold - Supine Cervical Retraction with Towel  - 1 x daily - 7 x weekly - 3 sets - 10 reps - Standing Shoulder Diagonal Horizontal  Abduction 60/120 Degrees with Resistance  - 1 x daily - 7 x weekly - 3 sets - 10 reps - Standing Shoulder External Rotation with Resistance  - 1 x daily - 7 x weekly - 3 sets - 10 reps - Shoulder External Rotation and Scapular Retraction with Resistance  - 1 x daily - 7 x weekly - 3 sets - 10 reps  Treatment priorities   Eval        Manual and TPR mid trap/rhomboid         Progressive periscapular stretching                                  ASSESSMENT:  CLINICAL IMPRESSION: Giannamarie returns to clinic with 4/10 pain and sharp pain when palpated.  Pt HEP upgraded and pt consents to TPDN and is closely monitored throughout session with decreased muscle tension and end of session.  Pt is able to perform exercises after TPDN with minimal pain. Pt was appreciative of the TPDN and will continue at home with posture education and relaxation techniques and use of heat.  Will continue POC with no other questions at end of session.  EVAL -Nekita is a 58 y.o. female who presents to clinic with signs and sxs consistent with L sided medial periscapular pain.  Consistent with myofacial origin with point tenderness and no reproduction with cervical movements.  She will benefit from skilled PT to address relevant deficits and improve comfort with daily tasks including work, lifting, and driving.   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[place], driving  PERSONAL FACTORS: See medical history and pertinent history   REHAB POTENTIAL: Good  CLINICAL DECISION MAKING: Evolving/moderate complexity  EVALUATION COMPLEXITY: Moderate   GOALS:   SHORT TERM GOALS: Target date: 06/10/2024  Kynsleigh will be >75% HEP compliant to improve carryover between sessions and facilitate independent management of condition  Evaluation: ongoing Goal status: INITIAL   LONG TERM GOALS: Target date: 07/08/2024   Kerly will self report >/= 50% decrease  in pain from evaluation to improve function in daily tasks  Evaluation/Baseline: 8/10 max pain Goal status: INITIAL   2.  Kellen will show a >/= 10 pt improvement in their NDI score (MCID ~20% or 10/50 pts) as a proxy for functional improvement  Evaluation/Baseline: 23/50 pts Goal status: INITIAL   3.  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  Chenay will improve the following MMTs to >/= 4/5 to show improvement in strength:  mid and lower trap (L)   Evaluation/Baseline: see chart in note Goal status: INITIAL    PLAN: PT FREQUENCY: 1-2x/week  PT DURATION: 8 weeks  PLANNED INTERVENTIONS:  97164- PT Re-evaluation, 97110-Therapeutic exercises, 97530- Therapeutic activity, [id number]- Neuromuscular re-education, 97535- Self Care, 16109- Manual therapy, [id number]- Gait training, [id number]- Aquatic Therapy, [phone]- Electrical stimulation (manual), [id number]- Vasopneumatic device, [id number]- Traction (mechanical), [id number]- Ionotophoresis 4mg /ml Dexamethasone , Taping, Dry Needling, Joint manipulation, and Spinal manipulation.   Sharlet Dawson, PT, [facility] Certified Exercise Expert for the Aging Adult  05/25/24 3:31 PM Phone: [phone] Fax: [phone]

## 2024-05-27 ENCOUNTER — Ambulatory Visit: Admitting: Physical Therapy

## 2024-06-01 ENCOUNTER — Ambulatory Visit: Admitting: Physical Therapy

## 2024-06-01 ENCOUNTER — Encounter: Payer: Self-pay | Admitting: Physical Therapy

## 2024-06-01 DIAGNOSIS — M546 Pain in thoracic spine: Secondary | ICD-10-CM | POA: Diagnosis not present

## 2024-06-01 DIAGNOSIS — M6281 Muscle weakness (generalized): Secondary | ICD-10-CM

## 2024-06-01 NOTE — Therapy (Signed)
 OUTPATIENT PHYSICAL THERAPY SHOULDER DAILY NOTE   Patient Name: Stephanie Serrano MRN: [id number] DOB:[birth year], 58 y.o., female Today's Date: 06/01/2024   PT End of Session - 06/01/24 1406     Visit Number 3    Date for PT Re-Evaluation 07/07/24    Authorization Type UHC MCR - NDI    PT Start Time 0203    PT Stop Time 0242    PT Time Calculation (min) 39 min    Activity Tolerance Patient tolerated treatment well;Patient limited by pain    Behavior During Therapy WFL for tasks assessed/performed           Past Medical History:  Diagnosis Date   Allergy    Anemia    Anxiety    Arthritis    Asthma    Blood transfusion without reported diagnosis    Depression    Eczema    Esophageal stricture    GERD (gastroesophageal reflux disease)    Migraines    OCD (obsessive compulsive disorder)    RLS (restless legs syndrome)    Vertigo    Past Surgical History:  Procedure Laterality Date   ESOPHAGOGASTRODUODENOSCOPY N/A 02/09/2015   Procedure: ESOPHAGOGASTRODUODENOSCOPY (EGD);  Surgeon: Stephanie JONETTA Aho, MD;  Location: [facility];  Service: Endoscopy;  Laterality: N/A;  with dilation   FOOT SURGERY     bone spurs   NASAL SINUS SURGERY     PLANTAR FASCIA RELEASE Right 08/03/2016   Procedure: PLANTAR FASCIA RELEASE WITH BONE SPUR EXCISION;  Surgeon: Stephanie Gavel, MD;  Location: [facility];  Service: Orthopedics;  Laterality: Right;   TONSILLECTOMY     Patient Active Problem List   Diagnosis Date Noted   Chest pain of uncertain etiology 10/14/2019   Heart palpitations 09/05/2019   Shortness of breath 09/05/2019   Anxiety 09/05/2019   Tachycardia 07/23/2019   Sprain of right ankle 02/03/2019   MDD (major depressive disorder), recurrent episode, moderate (HCC) 06/28/2017   OCD (obsessive compulsive disorder) 03/26/2017   Obstructive sleep apnea of adult 01/18/2017   Insomnia 02/08/2016   Heel spur 09/01/2015   Family history of cystitis 08/31/2015   Left  foot pain 08/31/2015   Prediabetes 08/23/2015   Hyperlipidemia 08/23/2015   Depression 03/21/2015   Acute esophagitis 02/09/2015   Gastroesophageal reflux disease without esophagitis 08/27/2014   Dysphagia 03/22/2014    PCP: Stephanie Lamarr RAMAN, MD  REFERRING PROVIDER: Irving Delinda ORN, MD  THERAPY DIAG:  Pain in thoracic spine  Muscle weakness  REFERRING DIAG: Radiculitis of left cervical region [M54.12]   Rationale for Evaluation and Treatment:  Rehabilitation  SUBJECTIVE:  PERTINENT PAST HISTORY:  Dysphagia, depression, prediabetes, OCD, depression, anxiety        PRECAUTIONS: None  WEIGHT BEARING RESTRICTIONS No  FALLS:  Has patient fallen in last 6 months? No, Number of falls: 0  MOI/History of condition:  Onset date: May 10th  SUBJECTIVE STATEMENT Pain 4/10 left shoulder blade , right posterior shoulder. I felt more mobility after the TPDN.     EVAL- Stephanie Serrano is a 58 y.o. female who presents to clinic with chief complaint of L medial periscapular pain.  This started in early May.  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  The pain goes away completely if she rests.  She denies any n/t or pain into the L UE.  She did not have any specific trauma though she does report that she tried on  bras shortly before the pain started which may have contributed to her sxs.   Red flags:  denies   Pain:  Are you having pain? 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   OBJECTIVE:   DIAGNOSTIC FINDINGS:  Depression, OCD, anxiety  GENERAL OBSERVATION: Rounded  shoulder     SENSATION: Light touch: Appears intact   PALPATION: TTP medial periscapular musculature mid trap/rhomboid   Cervical ROM  ROM ROM  (Eval)  Flexion 40 pulling  Extension 30*  Right lateral flexion 40  Left lateral flexion 40  Right rotation 60  Left rotation 60  Flexion rotation (normal is 30 degrees)   Flexion rotation (normal is 30 degrees)     (Blank rows = not tested, N = WNL, * = concordant pain)  UPPER EXTREMITY MMT:  MMT Right (Eval) Left (Eval)  Shoulder flexion    Shoulder abduction (C5)    Shoulder ER 4+ 4*  Shoulder IR    Middle trapezius 4 3+*  Lower trapezius 4 3+*  Shoulder extension 4+ 4+*  Grip strength    Shoulder shrug (C4)    Elbow flexion (C6)    Elbow ext (C7)    Thumb ext (C8)    Finger abd (T1)    Grossly     (Blank rows = not tested, score listed is out of 5 possible points.  N = WNL, D = diminished, C = clear for gross weakness with myotome testing, * = concordant pain with testing)   SPECIAL TESTS:  Spurlings (-)   PATIENT SURVEYS:  NDI: 23/50    TODAY'S TREATMENT:  [facility] Adult PT Treatment:                                                DATE: 06/01/24 Therapeutic Exercise: UBE 2 min each way L1  Open books x 8 each  Supine horiz abdct x 10  Supine star pattern x 10 each  DNF 5 sec x 10 Supine pro traction  x 10  Supine shoulder ER RTB 10 x 3  Quadriped with thread the needle Quadriped Alt UE with chin tuck  Seated in chair thoracic extension with foam roller vs wall for gym  LS and upper trap stretch  Self care- using theracane for self TPR      [facility] Adult PT Treatment:                                                DATE: 05-25-24 Therapeutic Exercise: Quadriped with thread the needle and thoracic stretch elbow to ceiling Whip rotation with  seated elbow elevated hands on head Shoulder Rolls in Sitting  20 x Sidelying Thoracic Rotation with Open Book  20 reps - 2 sec  hold LS stretch on left and right  3  x with 20 sec hold Supine Cervical Retraction with Towel 10 reps 10 sec hold Star pattern  with Red T band  3 x 10 reps Shoulder External Rotation and Scapular Retraction with RTB  3 x 10 Manual Therapy: STW left UT,LS rhomboids thoracic paraspinals C-3 to C-6 left UPA Cervical distraction with cavitation Left rib mobs Trigger Point Dry Needling  Initial Treatment: Pt instructed on Dry Needling rational, procedures, and possible side effects. Pt instructed to expect mild to moderate muscle soreness later in the day and/or into the next day.  Pt instructed in methods to reduce muscle soreness. Pt instructed to continue prescribed HEP. Because Dry Needling was performed over or adjacent to a lung field, pt was educated on S/S of pneumothorax and to seek immediate medical attention should they occur.  Patient was educated on signs and symptoms of infection and other risk factors and advised to seek medical attention should they occur.  Patient verbalized understanding of these instructions and education.   Patient Verbal Consent Given: Yes Education Handout Provided: Yes Muscles Treated:  left side onle UT, LS, Rhomboid, thoracic paraspinals, Electrical Stimulation Performed: No Treatment Response/Outcome: muscle tension release and decrease in pain   Self Care: Relaxation techniques and use of heat at home Sitting and standing posture and sleeping with pillow positioning   EVAL Therapeutic Exercise: Creating, reviewing, and completing below HEP    PATIENT EDUCATION ([place]/HM):  POC, diagnosis, prognosis, HEP, and outcome measures.  Pt educated via explanation, demonstration, and handout (HEP).  Pt confirms understanding verbally.    HOME EXERCISE PROGRAM: Access Code: [email or web address]  updated URL: [email or web address] Date: 05/25/2024 Prepared by: Graydon Dingwall  Program Notes Add 1)Quadriped with 1)thread the needle 2) thoracic stretch elbow to ceilingAdd Whip  rotation with elbow elevated hands on head  Exercises - Shoulder Rolls in Sitting  - 1 x daily - 7 x weekly - 3 sets - 10 reps - Sidelying Thoracic Rotation with Open Book  - 1 x daily - 7 x weekly - 1 sets - 20 reps - 2 hold - Seated Levator Scapulae Stretch  - 1 x daily - 7 x weekly - 3 reps - 45 second hold - Supine Cervical Retraction with Towel  - 1 x daily - 7 x weekly - 3 sets - 10 reps - Standing Shoulder Diagonal Horizontal Abduction 60/120 Degrees with Resistance  - 1 x daily - 7 x weekly - 3 sets - 10 reps - Standing Shoulder External Rotation with Resistance  - 1 x daily - 7 x weekly - 3 sets - 10 reps - Shoulder External Rotation and Scapular Retraction with Resistance  - 1 x daily - 7 x weekly - 3 sets - 10 reps  Treatment priorities   Eval        Manual and TPR mid trap/rhomboid         Progressive periscapular stretching                                  ASSESSMENT:  CLINICAL IMPRESSION: Pt reports benefit from TPDN continues to feel stiffness in upper back and shoulders. Session focused on exercise and activity to increase shoulder and thoracic mobility also with postural strengthening. Pt felt positive response to session. Also self care provided with use of thera cane for self trigger point release and pt was given information on how to purchase for home use.   EVAL -Correna is a 58 y.o. female who presents to clinic with signs and sxs consistent with L sided medial  periscapular pain.  Consistent with myofacial origin with point tenderness and no reproduction with cervical movements.  She will benefit from skilled PT to address relevant deficits and improve comfort with daily tasks including work, lifting, and driving.   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[place], driving  PERSONAL FACTORS: See medical history and pertinent history   REHAB POTENTIAL: Good  CLINICAL DECISION MAKING:  Evolving/moderate complexity  EVALUATION COMPLEXITY: Moderate   GOALS:   SHORT TERM GOALS: Target date: 06/10/2024  Zyla will be >75% HEP compliant to improve carryover between sessions and facilitate independent management of condition  Evaluation: ongoing Goal status: INITIAL   LONG TERM GOALS: Target date: 07/08/2024   Cloey will self report >/= 50% decrease in pain from evaluation to improve function in daily tasks  Evaluation/Baseline: 8/10 max pain Goal status: INITIAL   2.  Emillia will show a >/= 10 pt improvement in their NDI score (MCID ~20% or 10/50 pts) as a proxy for functional improvement  Evaluation/Baseline: 23/50 pts Goal status: INITIAL   3.  Averlee will be able to lift 25# to simulate lifting a child at work, not limited by pain  Evaluation/Baseline: limited Goal status: INITIAL   4.  Kayanna will improve the following MMTs to >/= 4/5 to show improvement in strength:  mid and lower trap (L)   Evaluation/Baseline: see chart in note Goal status: INITIAL    PLAN: PT FREQUENCY: 1-2x/week  PT DURATION: 8 weeks  PLANNED INTERVENTIONS:  97164- PT Re-evaluation, 97110-Therapeutic exercises, 97530- Therapeutic activity, [id number]- Neuromuscular re-education, 97535- Self Care, [id number]- Manual therapy, [id number]- Gait training, [id number]- Aquatic Therapy, [phone]- Electrical stimulation (manual), [id number]- Vasopneumatic device, [id number]- Traction (mechanical), [id number]- Ionotophoresis 4mg /ml Dexamethasone , Taping, Dry Needling, Joint manipulation, and Spinal manipulation.   Harlene Persons, PTA 06/01/24 2:44 PM Phone: [phone] Fax: [phone]

## 2024-06-03 ENCOUNTER — Ambulatory Visit: Admitting: Physical Therapy

## 2024-06-03 ENCOUNTER — Encounter: Payer: Self-pay | Admitting: Physical Therapy

## 2024-06-03 DIAGNOSIS — M546 Pain in thoracic spine: Secondary | ICD-10-CM | POA: Diagnosis not present

## 2024-06-03 DIAGNOSIS — M6281 Muscle weakness (generalized): Secondary | ICD-10-CM

## 2024-06-03 NOTE — Therapy (Signed)
 OUTPATIENT PHYSICAL THERAPY SHOULDER DAILY NOTE   Patient Name: Genever Hentges MRN: [id number] DOB:[birth year], 58 y.o., female Today's Date: 06/03/2024   PT End of Session - 06/03/24 1413     Visit Number 4    Date for PT Re-Evaluation 07/07/24    Authorization Type [employer] MCR - NDI    PT Start Time 0215    PT Stop Time 0256    PT Time Calculation (min) 41 min    Activity Tolerance Patient tolerated treatment well;Patient limited by pain    Behavior During Therapy WFL for tasks assessed/performed           Past Medical History:  Diagnosis Date   Allergy    Anemia    Anxiety    Arthritis    Asthma    Blood transfusion without reported diagnosis    Depression    Eczema    Esophageal stricture    GERD (gastroesophageal reflux disease)    Migraines    OCD (obsessive compulsive disorder)    RLS (restless legs syndrome)    Vertigo    Past Surgical History:  Procedure Laterality Date   ESOPHAGOGASTRODUODENOSCOPY N/A 02/09/2015   Procedure: ESOPHAGOGASTRODUODENOSCOPY (EGD);  Surgeon: Lamar JONETTA Aho, MD;  Location: [facility];  Service: Endoscopy;  Laterality: N/A;  with dilation   FOOT SURGERY     bone spurs   NASAL SINUS SURGERY     PLANTAR FASCIA RELEASE Right 08/03/2016   Procedure: PLANTAR FASCIA RELEASE WITH BONE SPUR EXCISION;  Surgeon: Norleen Gavel, MD;  Location: [facility];  Service: Orthopedics;  Laterality: Right;   TONSILLECTOMY     Patient Active Problem List   Diagnosis Date Noted   Chest pain of uncertain etiology 10/14/2019   Heart palpitations 09/05/2019   Shortness of breath 09/05/2019   Anxiety 09/05/2019   Tachycardia 07/23/2019   Sprain of right ankle 02/03/2019   MDD (major depressive disorder), recurrent episode, moderate (HCC) 06/28/2017   OCD (obsessive compulsive disorder) 03/26/2017   Obstructive sleep apnea of adult 01/18/2017   Insomnia 02/08/2016   Heel spur 09/01/2015   Family history of cystitis 08/31/2015   Left  foot pain 08/31/2015   Prediabetes 08/23/2015   Hyperlipidemia 08/23/2015   Depression 03/21/2015   Acute esophagitis 02/09/2015   Gastroesophageal reflux disease without esophagitis 08/27/2014   Dysphagia 03/22/2014    PCP: Chrystal Lamarr RAMAN, MD  REFERRING PROVIDER: Chrystal Lamarr RAMAN, *  THERAPY DIAG:  Pain in thoracic spine  Muscle weakness  REFERRING DIAG: Radiculitis of left cervical region [M54.12]   Rationale for Evaluation and Treatment:  Rehabilitation  SUBJECTIVE:  PERTINENT PAST HISTORY:  Dysphagia, depression, prediabetes, OCD, depression, anxiety        PRECAUTIONS: None  WEIGHT BEARING RESTRICTIONS No  FALLS:  Has patient fallen in last 6 months? No, Number of falls: 0  MOI/History of condition:  Onset date: May 10th  SUBJECTIVE STATEMENT  06/03/2024: Pt reports that she feels that L side is feeling better but can be tight at times.   EVAL- Zeidy Tayag is a 58 y.o. female who presents to clinic with chief complaint of L medial periscapular pain.  This started in early May.  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  The pain goes away completely if she rests.  She denies any n/t or pain into the L UE.  She did not have any specific trauma though she does report that she tried  on bras shortly before the pain started which may have contributed to her sxs.   Red flags:  denies   Pain:  Are you having pain? 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   OBJECTIVE:   DIAGNOSTIC FINDINGS:  Depression, OCD, anxiety  GENERAL OBSERVATION: Rounded  shoulder     SENSATION: Light touch: Appears intact   PALPATION: TTP medial periscapular musculature mid trap/rhomboid   Cervical ROM  ROM ROM  (Eval)  Flexion 40 pulling  Extension 30*  Right lateral flexion 40  Left lateral flexion 40  Right rotation 60  Left rotation 60  Flexion rotation (normal is 30 degrees)   Flexion rotation (normal is 30 degrees)     (Blank rows = not tested, N = WNL, * = concordant pain)  UPPER EXTREMITY MMT:  MMT Right (Eval) Left (Eval)  Shoulder flexion    Shoulder abduction (C5)    Shoulder ER 4+ 4*  Shoulder IR    Middle trapezius 4 3+*  Lower trapezius 4 3+*  Shoulder extension 4+ 4+*  Grip strength    Shoulder shrug (C4)    Elbow flexion (C6)    Elbow ext (C7)    Thumb ext (C8)    Finger abd (T1)    Grossly     (Blank rows = not tested, score listed is out of 5 possible points.  N = WNL, D = diminished, C = clear for gross weakness with myotome testing, * = concordant pain with testing)   SPECIAL TESTS:  Spurlings (-)   PATIENT SURVEYS:  NDI: 23/50    TODAY'S TREATMENT:   [facility] Adult PT Treatment:                                                DATE: 06/03/24 Therapeutic Exercise: Foam roller routine (1/2 foam roller) for thoracic mobility - including protraction/retraction (unilateral and bilateral), cc/cw circles, horizontal abduction, shoulder flexion/ext alternating Supine star pattern x 10 each - on 1/2 foam roller Supine retraction into table 2x10  Supine shoulder ER RTB 10 x 3 on 1/2 foam roller Quadriped with thread the needle Quadriped cat camel Quadriped Alt UE with chin tuck  Discussion regarding capacity and exercise tolerance  Manual Therapy  STM L periscapular musculature  [facility] Adult PT Treatment:                                                DATE: 06/01/24 Therapeutic Exercise: UBE 2 min each way L1  Open books x 8 each  Supine horiz abdct x 10  Supine star pattern x 10 each  DNF 5 sec x  10 Supine pro traction  x 10  Supine shoulder ER RTB 10 x 3  Quadriped with thread the needle Quadriped Alt UE with chin tuck  Seated in chair thoracic extension with foam roller vs wall for gym  LS and upper trap  stretch  Self care- using theracane for self TPR      [facility] Adult PT Treatment:                                                DATE: 05-25-24 Therapeutic Exercise: Quadriped with thread the needle and thoracic stretch elbow to ceiling Whip rotation with  seated elbow elevated hands on head Shoulder Rolls in Sitting  20 x Sidelying Thoracic Rotation with Open Book  20 reps - 2 sec  hold LS stretch on left and right  3 x with 20 sec hold Supine Cervical Retraction with Towel 10 reps 10 sec hold Star pattern  with Red T band  3 x 10 reps Shoulder External Rotation and Scapular Retraction with RTB  3 x 10 Manual Therapy: STW left UT,LS rhomboids thoracic paraspinals C-3 to C-6 left UPA Cervical distraction with cavitation Left rib mobs Trigger Point Dry Needling  Initial Treatment: Pt instructed on Dry Needling rational, procedures, and possible side effects. Pt instructed to expect mild to moderate muscle soreness later in the day and/or into the next day.  Pt instructed in methods to reduce muscle soreness. Pt instructed to continue prescribed HEP. Because Dry Needling was performed over or adjacent to a lung field, pt was educated on S/S of pneumothorax and to seek immediate medical attention should they occur.  Patient was educated on signs and symptoms of infection and other risk factors and advised to seek medical attention should they occur.  Patient verbalized understanding of these instructions and education.   Patient Verbal Consent Given: Yes Education Handout Provided: Yes Muscles Treated:  left side onle UT, LS, Rhomboid, thoracic paraspinals, Electrical Stimulation Performed: No Treatment Response/Outcome: muscle tension release and decrease in pain   Self  Care: Relaxation techniques and use of heat at home Sitting and standing posture and sleeping with pillow positioning   EVAL Therapeutic Exercise: Creating, reviewing, and completing below HEP    PATIENT EDUCATION ([place]/HM):  POC, diagnosis, prognosis, HEP, and outcome measures.  Pt educated via explanation, demonstration, and handout (HEP).  Pt confirms understanding verbally.    HOME EXERCISE PROGRAM: Access Code: [email or web address]  updated URL: [email or web address] Date: 05/25/2024 Prepared by: Graydon Dingwall  Program Notes Add 1)Quadriped with 1)thread the needle 2) thoracic stretch elbow to ceilingAdd Whip rotation with elbow elevated hands on head  Exercises - Shoulder Rolls in Sitting  - 1 x daily - 7 x weekly - 3 sets - 10 reps - Sidelying Thoracic Rotation with Open Book  - 1 x daily - 7 x weekly - 1 sets - 20 reps - 2 hold - Seated Levator Scapulae Stretch  - 1 x daily - 7 x weekly - 3 reps - 45 second hold - Supine Cervical Retraction with Towel  - 1 x daily - 7 x weekly - 3 sets - 10 reps - Standing Shoulder Diagonal Horizontal Abduction 60/120 Degrees with Resistance  - 1 x daily - 7 x weekly - 3 sets - 10 reps - Standing Shoulder External Rotation with Resistance  - 1 x daily - 7 x weekly - 3 sets - 10 reps - Shoulder External Rotation and Scapular Retraction with Resistance  - 1 x daily - 7 x weekly - 3 sets - 10 reps  Treatment priorities   Eval  Manual and TPR mid trap/rhomboid         Progressive periscapular stretching                                  ASSESSMENT:  CLINICAL IMPRESSION: Terita tolerated session well with no adverse reaction.  Concentrated on scapulothoracic mobility with addition of foam roller routine which was tolerated well.  Discussed the idea of capacity and tissue sensitivity and goals of slowly building capacity and desensitizing tissue; that it is unnecessary to avoid all activity that generates discomfort as long as  the discomfort fades quickly.  EVAL -Zakaiya is a 58 y.o. female who presents to clinic with signs and sxs consistent with L sided medial periscapular pain.  Consistent with myofacial origin with point tenderness and no reproduction with cervical movements.  She will benefit from skilled PT to address relevant deficits and improve comfort with daily tasks including work, lifting, and driving.   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[place], driving  PERSONAL FACTORS: See medical history and pertinent history   REHAB POTENTIAL: Good  CLINICAL DECISION MAKING: Evolving/moderate complexity  EVALUATION COMPLEXITY: Moderate   GOALS:   SHORT TERM GOALS: Target date: 06/10/2024  Lakoda will be >75% HEP compliant to improve carryover between sessions and facilitate independent management of condition  Evaluation: ongoing Goal status: INITIAL   LONG TERM GOALS: Target date: 07/08/2024   Marni will self report >/= 50% decrease in pain from evaluation to improve function in daily tasks  Evaluation/Baseline: 8/10 max pain Goal status: INITIAL   2.  Milarose will show a >/= 10 pt improvement in their NDI score (MCID ~20% or 10/50 pts) as a proxy for functional improvement  Evaluation/Baseline: 23/50 pts Goal status: INITIAL   3.  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  Yumiko will improve the following MMTs to >/= 4/5 to show improvement in strength:  mid and lower trap (L)   Evaluation/Baseline: see chart in note Goal status: INITIAL    PLAN: PT FREQUENCY: 1-2x/week  PT DURATION: 8 weeks  PLANNED INTERVENTIONS:  97164- PT Re-evaluation, 97110-Therapeutic exercises, 97530- Therapeutic activity, [id number]- Neuromuscular re-education, 97535- Self Care, 02859- Manual therapy, [id number]- Gait training, [id number]-  Aquatic Therapy, [id number]- Electrical stimulation (manual), [id number]- Vasopneumatic device, [id number]- Traction (mechanical), [id number]- Ionotophoresis 4mg /ml Dexamethasone , Taping, Dry Needling, Joint manipulation, and Spinal manipulation.   Helene BRAVO Lianny Molter PT 06/03/24 3:05 PM Phone: [phone] Fax: [phone]

## 2024-06-05 ENCOUNTER — Telehealth: Payer: Self-pay

## 2024-06-08 ENCOUNTER — Encounter: Payer: Self-pay | Admitting: Physical Therapy

## 2024-06-08 ENCOUNTER — Ambulatory Visit: Admitting: Physical Therapy

## 2024-06-08 DIAGNOSIS — M546 Pain in thoracic spine: Secondary | ICD-10-CM

## 2024-06-08 DIAGNOSIS — M6281 Muscle weakness (generalized): Secondary | ICD-10-CM

## 2024-06-08 NOTE — Therapy (Signed)
 OUTPATIENT PHYSICAL THERAPY SHOULDER DAILY NOTE   Patient Name: Stephanie Serrano MRN: [id number] DOB:[birth year], 58 y.o., female Today's Date: 06/08/2024   PT End of Session - 06/08/24 1404     Visit Number 5    Date for PT Re-Evaluation 07/07/24    Authorization Type UHC MCR - NDI    PT Start Time 0201    PT Stop Time 0245    PT Time Calculation (min) 44 min    Activity Tolerance Patient tolerated treatment well;Patient limited by pain    Behavior During Therapy WFL for tasks assessed/performed           Past Medical History:  Diagnosis Date   Allergy    Anemia    Anxiety    Arthritis    Asthma    Blood transfusion without reported diagnosis    Depression    Eczema    Esophageal stricture    GERD (gastroesophageal reflux disease)    Migraines    OCD (obsessive compulsive disorder)    RLS (restless legs syndrome)    Vertigo    Past Surgical History:  Procedure Laterality Date   ESOPHAGOGASTRODUODENOSCOPY N/A 02/09/2015   Procedure: ESOPHAGOGASTRODUODENOSCOPY (EGD);  Surgeon: Lamar JONETTA Aho, MD;  Location: [facility];  Service: Endoscopy;  Laterality: N/A;  with dilation   FOOT SURGERY     bone spurs   NASAL SINUS SURGERY     PLANTAR FASCIA RELEASE Right 08/03/2016   Procedure: PLANTAR FASCIA RELEASE WITH BONE SPUR EXCISION;  Surgeon: Norleen Gavel, MD;  Location: [facility];  Service: Orthopedics;  Laterality: Right;   TONSILLECTOMY     Patient Active Problem List   Diagnosis Date Noted   Chest pain of uncertain etiology 10/14/2019   Heart palpitations 09/05/2019   Shortness of breath 09/05/2019   Anxiety 09/05/2019   Tachycardia 07/23/2019   Sprain of right ankle 02/03/2019   MDD (major depressive disorder), recurrent episode, moderate (HCC) 06/28/2017   OCD (obsessive compulsive disorder) 03/26/2017   Obstructive sleep apnea of adult 01/18/2017   Insomnia 02/08/2016   Heel spur 09/01/2015   Family history of cystitis 08/31/2015   Left  foot pain 08/31/2015   Prediabetes 08/23/2015   Hyperlipidemia 08/23/2015   Depression 03/21/2015   Acute esophagitis 02/09/2015   Gastroesophageal reflux disease without esophagitis 08/27/2014   Dysphagia 03/22/2014    PCP: Chrystal Lamarr RAMAN, MD  REFERRING PROVIDER: Chrystal Lamarr RAMAN, *  THERAPY DIAG:  Pain in thoracic spine  Muscle weakness  REFERRING DIAG: Radiculitis of left cervical region [M54.12]   Rationale for Evaluation and Treatment:  Rehabilitation  SUBJECTIVE:  PERTINENT PAST HISTORY:  Dysphagia, depression, prediabetes, OCD, depression, anxiety        PRECAUTIONS: None  WEIGHT BEARING RESTRICTIONS No  FALLS:  Has patient fallen in last 6 months? No, Number of falls: 0  MOI/History of condition:  Onset date: May 10th  SUBJECTIVE STATEMENT  06/08/2024: Pt reports that she feels that right neck and upper back pain increased for 2-3 days. Has returned to baseline. Having  L scapular pain on arrival.   EVAL- Stephanie Serrano is a 58 y.o. female who presents to clinic with chief complaint of L medial periscapular pain.  This started in early May.  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  The pain goes away completely if she rests.  She denies any n/t or pain into the L UE.  She did not have  any specific trauma though she does report that she tried on bras shortly before the pain started which may have contributed to her sxs.   Red flags:  denies   Pain:  Are you having pain? 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   OBJECTIVE:   DIAGNOSTIC FINDINGS:   Depression, OCD, anxiety  GENERAL OBSERVATION: Rounded shoulder     SENSATION: Light touch: Appears intact   PALPATION: TTP medial periscapular musculature mid trap/rhomboid   Cervical ROM  ROM ROM  (Eval)  Flexion 40 pulling  Extension 30*  Right lateral flexion 40  Left lateral flexion 40  Right rotation 60  Left rotation 60  Flexion rotation (normal is 30 degrees)   Flexion rotation (normal is 30 degrees)     (Blank rows = not tested, N = WNL, * = concordant pain)  UPPER EXTREMITY MMT:  MMT Right (Eval) Left (Eval)  Shoulder flexion    Shoulder abduction (C5)    Shoulder ER 4+ 4*  Shoulder IR    Middle trapezius 4 3+*  Lower trapezius 4 3+*  Shoulder extension 4+ 4+*  Grip strength    Shoulder shrug (C4)    Elbow flexion (C6)    Elbow ext (C7)    Thumb ext (C8)    Finger abd (T1)    Grossly     (Blank rows = not tested, score listed is out of 5 possible points.  N = WNL, D = diminished, C = clear for gross weakness with myotome testing, * = concordant pain with testing)   SPECIAL TESTS:  Spurlings (-)   PATIENT SURVEYS:  NDI: 23/50    TODAY'S TREATMENT:  [facility] Adult PT Treatment:                                                DATE: 06/08/24 Therapeutic Exercise: Supine star pattern , GTB 10 x 2  Supine horiz abdct 10 x 2  Supine shoulder ER GTB 2 x 15 DNF 5 sec x 8 Supine bilateral Protraction x 10 Open books- dizziness so disc.  Prone scap retract x 10 Prone Bent T x 10 Qped Alt UE lifts with chin tuck  Childs pose with thread the needle  Seated UT and LS stretches bilaterally  Manual Therapy: OH Shoulder distraction, Bilaterally   Self Care: TPR with theracane    [facility] Adult PT Treatment:                                                DATE: 06/03/24 Therapeutic Exercise: Foam roller routine (1/2 foam roller) for thoracic mobility - including protraction/retraction (unilateral and bilateral), cc/cw circles, horizontal abduction,  shoulder flexion/ext alternating Supine star pattern x 10 each - on 1/2 foam roller Supine retraction into table 2x10  Supine shoulder ER RTB 10 x 3 on 1/2 foam roller Quadriped with thread the needle Quadriped cat camel Quadriped  Alt UE with chin tuck  Discussion regarding capacity and exercise tolerance  Manual Therapy  STM L periscapular musculature  [facility] Adult PT Treatment:                                                DATE: 06/01/24 Therapeutic Exercise: UBE 2 min each way L1  Open books x 8 each  Supine horiz abdct x 10  Supine star pattern x 10 each  DNF 5 sec x 10 Supine pro traction  x 10  Supine shoulder ER RTB 10 x 3  Quadriped with thread the needle Quadriped Alt UE with chin tuck  Seated in chair thoracic extension with foam roller vs wall for gym  LS and upper trap stretch  Self care- using theracane for self TPR          PATIENT EDUCATION ([place]/HM):  POC, diagnosis, prognosis, HEP, and outcome measures.  Pt educated via explanation, demonstration, and handout (HEP).  Pt confirms understanding verbally.    HOME EXERCISE PROGRAM: Access Code: [email or web address]  updated URL: [email or web address] Date: 05/25/2024 Prepared by: Graydon Dingwall  Program Notes Add 1)Quadriped with 1)thread the needle 2) thoracic stretch elbow to ceilingAdd Whip rotation with elbow elevated hands on head  Exercises - Shoulder Rolls in Sitting  - 1 x daily - 7 x weekly - 3 sets - 10 reps - Sidelying Thoracic Rotation with Open Book  - 1 x daily - 7 x weekly - 1 sets - 20 reps - 2 hold - Seated Levator Scapulae Stretch  - 1 x daily - 7 x weekly - 3 reps - 45 second hold - Supine Cervical Retraction with Towel  - 1 x daily - 7 x weekly - 3 sets - 10 reps - Standing Shoulder Diagonal Horizontal Abduction 60/120 Degrees with Resistance  - 1 x daily - 7 x weekly - 3 sets - 10 reps - Standing Shoulder External Rotation with Resistance  - 1 x daily - 7 x weekly - 3 sets - 10  reps - Shoulder External Rotation and Scapular Retraction with Resistance  - 1 x daily - 7 x weekly - 3 sets - 10 reps  Treatment priorities   Eval        Manual and TPR mid trap/rhomboid         Progressive periscapular stretching                                  ASSESSMENT:  CLINICAL IMPRESSION: Stephanie Serrano tolerated session well with no adverse reaction.  Unsure if increased pain related to progression at last session. Opted to performed strengthening in supine and prone without using Foam roller today and check response next session. She reports Thurs/Friday were very painful for her right upper trap/ upper back where she had to take muscle relaxer's and to use heat. 2/10 left medial scap border pain today. She did well with prone additions and GTB in supine. She was challenged with maintaining protracted scapula in closed chain when performing UE raises, felt min pulling in neck today so discontinued. Will continue to progress as tolerated.   EVAL -Stephanie Serrano is a 58 y.o. female who presents to clinic with signs and sxs consistent with L sided medial periscapular pain.  Consistent with myofacial origin with point  tenderness and no reproduction with cervical movements.  She will benefit from skilled PT to address relevant deficits and improve comfort with daily tasks including work, lifting, and driving.   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[place], driving  PERSONAL FACTORS: See medical history and pertinent history   REHAB POTENTIAL: Good  CLINICAL DECISION MAKING: Evolving/moderate complexity  EVALUATION COMPLEXITY: Moderate   GOALS:   SHORT TERM GOALS: Target date: 06/10/2024  Stephanie Serrano will be >75% HEP compliant to improve carryover between sessions and facilitate independent management of condition  Evaluation: ongoing Goal status: INITIAL   LONG TERM GOALS: Target date: 07/08/2024   Stephanie Serrano will self  report >/= 50% decrease in pain from evaluation to improve function in daily tasks  Evaluation/Baseline: 8/10 max pain Goal status: INITIAL   2.  Stephanie Serrano will show a >/= 10 pt improvement in their NDI score (MCID ~20% or 10/50 pts) as a proxy for functional improvement  Evaluation/Baseline: 23/50 pts Goal status: INITIAL   3.  Stephanie Serrano will be able to lift 25# to simulate lifting a child at work, not limited by pain  Evaluation/Baseline: limited Goal status: INITIAL   4.  Stephanie Serrano will improve the following MMTs to >/= 4/5 to show improvement in strength:  mid and lower trap (L)   Evaluation/Baseline: see chart in note Goal status: INITIAL    PLAN: PT FREQUENCY: 1-2x/week  PT DURATION: 8 weeks  PLANNED INTERVENTIONS:  97164- PT Re-evaluation, 97110-Therapeutic exercises, 97530- Therapeutic activity, [id number]- Neuromuscular re-education, 97535- Self Care, 02859- Manual therapy, [id number]- Gait training, [id number]- Aquatic Therapy, [phone]- Electrical stimulation (manual), [id number]- Vasopneumatic device, [id number]- Traction (mechanical), [id number]- Ionotophoresis 4mg /ml Dexamethasone , Taping, Dry Needling, Joint manipulation, and Spinal manipulation.   Harlene CHRISTELLA Persons PTA 06/08/24 3:50 PM Phone: [phone] Fax: [phone]

## 2024-06-10 ENCOUNTER — Ambulatory Visit: Admitting: Physical Therapy

## 2024-06-10 NOTE — Therapy (Unsigned)
 OUTPATIENT PHYSICAL THERAPY SHOULDER DAILY NOTE   Patient Name: Stephanie Serrano MRN: [id number] DOB:[birth year], 58 y.o., female Today's Date: 06/11/2024   PT End of Session - 06/11/24 1504     Visit Number 6    Date for PT Re-Evaluation 07/07/24    Authorization Type U[employer] MCR - NDI    PT Start Time 1504    PT Stop Time 1546    PT Time Calculation (min) 42 min    Activity Tolerance Patient tolerated treatment well;Patient limited by pain    Behavior During Therapy WFL for tasks assessed/performed            Past Medical History:  Diagnosis Date   Allergy    Anemia    Anxiety    Arthritis    Asthma    Blood transfusion without reported diagnosis    Depression    Eczema    Esophageal stricture    GERD (gastroesophageal reflux disease)    Migraines    OCD (obsessive compulsive disorder)    RLS (restless legs syndrome)    Vertigo    Past Surgical History:  Procedure Laterality Date   ESOPHAGOGASTRODUODENOSCOPY N/A 02/09/2015   Procedure: ESOPHAGOGASTRODUODENOSCOPY (EGD);  Surgeon: Stephanie JONETTA Aho, MD;  Location: [facility];  Service: Endoscopy;  Laterality: N/A;  with dilation   FOOT SURGERY     bone spurs   NASAL SINUS SURGERY     PLANTAR FASCIA RELEASE Right 08/03/2016   Procedure: PLANTAR FASCIA RELEASE WITH BONE SPUR EXCISION;  Surgeon: Stephanie Gavel, MD;  Location: [facility];  Service: Orthopedics;  Laterality: Right;   TONSILLECTOMY     Patient Active Problem List   Diagnosis Date Noted   Chest pain of uncertain etiology 10/14/2019   Heart palpitations 09/05/2019   Shortness of breath 09/05/2019   Anxiety 09/05/2019   Tachycardia 07/23/2019   Sprain of right ankle 02/03/2019   MDD (major depressive disorder), recurrent episode, moderate (HCC) 06/28/2017   OCD (obsessive compulsive disorder) 03/26/2017   Obstructive sleep apnea of adult 01/18/2017   Insomnia 02/08/2016   Heel spur 09/01/2015   Family history of cystitis 08/31/2015   Left  foot pain 08/31/2015   Prediabetes 08/23/2015   Hyperlipidemia 08/23/2015   Depression 03/21/2015   Acute esophagitis 02/09/2015   Gastroesophageal reflux disease without esophagitis 08/27/2014   Dysphagia 03/22/2014    PCP: Stephanie Lamarr RAMAN, MD  REFERRING PROVIDER: Chrystal Lamarr Serrano, *  THERAPY DIAG:  Pain in thoracic spine  Muscle weakness  REFERRING DIAG: Radiculitis of left cervical region [M54.12]   Rationale for Evaluation and Treatment:  Rehabilitation  SUBJECTIVE:  PERTINENT PAST HISTORY:  Dysphagia, depression, prediabetes, OCD, depression, anxiety        PRECAUTIONS: None  WEIGHT BEARING RESTRICTIONS No  FALLS:  Has patient fallen in last 6 months? No, Number of falls: 0  MOI/History of condition:  Onset date: May 10th  SUBJECTIVE STATEMENT  06/11/2024: I did feel looser with TPDN and I had more range of motion and I did feel better. Now I feel like 2/10 pain and it tends to get aggravated if I am stretching and lifting at the same time  EVAL- Stephanie Serrano is a 58 y.o. female who presents to clinic with chief complaint of L medial periscapular pain.  This started in early May.  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  The pain goes away completely if she rests.  She denies  any n/t or pain into the L UE.  She did not have any specific trauma though she does report that she tried on bras shortly before the pain started which may have contributed to her sxs.   Red flags:  denies   Pain:  Are you having pain? 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  Assistive Device: na  Hand Dominance: R  Patient Goals/Specific Activities: pain, lifting kids at  work   OBJECTIVE:   DIAGNOSTIC FINDINGS:  Depression, OCD, anxiety  GENERAL OBSERVATION: Rounded shoulder     SENSATION: Light touch: Appears intact   PALPATION: TTP medial periscapular musculature mid trap/rhomboid   Cervical ROM  ROM ROM  (Eval)  Flexion 40 pulling  Extension 30*  Right lateral flexion 40  Left lateral flexion 40  Right rotation 60  Left rotation 60  Flexion rotation (normal is 30 degrees)   Flexion rotation (normal is 30 degrees)     (Blank rows = not tested, N = WNL, * = concordant pain)  UPPER EXTREMITY MMT:  MMT Right (Eval) Left (Eval)  Shoulder flexion    Shoulder abduction (C5)    Shoulder ER 4+ 4*  Shoulder IR    Middle trapezius 4 3+*  Lower trapezius 4 3+*  Shoulder extension 4+ 4+*  Grip strength    Shoulder shrug (C4)    Elbow flexion (C6)    Elbow ext (C7)    Thumb ext (C8)    Finger abd (T1)    Grossly     (Blank rows = not tested, score listed is out of 5 possible points.  N = WNL, D = diminished, C = clear for gross weakness with myotome testing, * = concordant pain with testing)   SPECIAL TESTS:  Spurlings (-)   PATIENT SURVEYS:  NDI: 23/50    TODAY'S TREATMENT:  [facility] Adult PT Treatment:                                                DATE: 06-11-24 Therapeutic Exercise: UE/LE Arm bike with VC and TC taking subjective Seated Star pattern with RTB 3 x 10 Shoulder ER GTB 2 x 15 Seated OH flexion and press with RTB 3 x 10 Y and T and W with 1 # DB x 12 Y and T with 2 # DB x 12 Childs pose with thread the needle  Seated UT and LS stretches bilaterally  Manual Therapy: OH Shoulder distraction, Bilaterally  Cervical distraction Thoracic PA mobs  [facility] Adult PT Treatment:                                                DATE: 06/08/24 Therapeutic Exercise: Supine star pattern , GTB 10 x 2  Supine horiz abdct 10 x 2  Supine shoulder ER GTB 2 x 15 DNF 5 sec x 8 Supine bilateral Protraction x 10 Open books-  dizziness so disc.  Prone scap retract x 10 Prone Bent T x 10 Qped Alt UE lifts  with chin tuck  Karolynn pose with thread the needle  Seated UT and LS stretches bilaterally  Manual Therapy: OH Shoulder distraction, Bilaterally   Self Care: TPR with theracane    [facility] Adult PT Treatment:                                                DATE: 06/03/24 Therapeutic Exercise: Foam roller routine (1/2 foam roller) for thoracic mobility - including protraction/retraction (unilateral and bilateral), cc/cw circles, horizontal abduction, shoulder flexion/ext alternating Supine star pattern x 10 each - on 1/2 foam roller Supine retraction into table 2x10  Supine shoulder ER RTB 10 x 3 on 1/2 foam roller Quadriped with thread the needle Quadriped cat camel Quadriped Alt UE with chin tuck  Discussion regarding capacity and exercise tolerance  Manual Therapy  STM L periscapular musculature  [facility] Adult PT Treatment:                                                DATE: 06/01/24 Therapeutic Exercise: UBE 2 min each way L1  Open books x 8 each  Supine horiz abdct x 10  Supine star pattern x 10 each  DNF 5 sec x 10 Supine pro traction  x 10  Supine shoulder ER RTB 10 x 3  Quadriped with thread the needle Quadriped Alt UE with chin tuck  Seated in chair thoracic extension with foam roller vs wall for gym  LS and upper trap stretch  Self care- using theracane for self TPR          PATIENT EDUCATION (Fowler/HM):  POC, diagnosis, prognosis, HEP, and outcome measures.  Pt educated via explanation, demonstration, and handout (HEP).  Pt confirms understanding verbally.    HOME EXERCISE PROGRAM: Access Code: [email or web address]  updated URL: [email or web address] Date: 05/25/2024 Prepared by: Graydon Dingwall  Program Notes Add 1)Quadriped with 1)thread the needle 2) thoracic stretch elbow to ceilingAdd Whip rotation with elbow elevated hands on head  Exercises - Shoulder Rolls in Sitting   - 1 x daily - 7 x weekly - 3 sets - 10 reps - Sidelying Thoracic Rotation with Open Book  - 1 x daily - 7 x weekly - 1 sets - 20 reps - 2 hold - Seated Levator Scapulae Stretch  - 1 x daily - 7 x weekly - 3 reps - 45 second hold - Supine Cervical Retraction with Towel  - 1 x daily - 7 x weekly - 3 sets - 10 reps - Standing Shoulder Diagonal Horizontal Abduction 60/120 Degrees with Resistance  - 1 x daily - 7 x weekly - 3 sets - 10 reps - Standing Shoulder External Rotation with Resistance  - 1 x daily - 7 x weekly - 3 sets - 10 reps - Shoulder External Rotation and Scapular Retraction with Resistance  - 1 x daily - 7 x weekly - 3 sets - 10 reps  Treatment priorities   Eval        Manual and TPR mid trap/rhomboid         Progressive periscapular stretching  ASSESSMENT:  CLINICAL IMPRESSION: Irlanda tolerated session well with no adverse reaction. Pt able to perform all exercises without exacerbation of pain. Performed strengthening in seated  and prone.  Pt 2/10 today and decreased to 1/10 at end of session with decreased muscle tension.  She was challenged with maintaining protracted scapula in closed chain when performing UE raises. Will continue to progress as tolerated.   EVAL -Princes is a 58 y.o. female who presents to clinic with signs and sxs consistent with L sided medial periscapular pain.  Consistent with myofacial origin with point tenderness and no reproduction with cervical movements.  She will benefit from skilled PT to address relevant deficits and improve comfort with daily tasks including work, lifting, and driving.   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OH, driving  PERSONAL FACTORS: See medical history and pertinent history   REHAB POTENTIAL: Good  CLINICAL DECISION MAKING: Evolving/moderate complexity  EVALUATION COMPLEXITY: Moderate   GOALS:   SHORT  TERM GOALS: Target date: 06/10/2024  Lashonda will be >75% HEP compliant to improve carryover between sessions and facilitate independent management of condition  Evaluation: ongoing Goal status: INITIAL   LONG TERM GOALS: Target date: 07/08/2024   Zaineb will self report >/= 50% decrease in pain from evaluation to improve function in daily tasks  Evaluation/Baseline: 8/10 max pain Goal status: INITIAL   2.  Annastasia will show a >/= 10 pt improvement in their NDI score (MCID ~20% or 10/50 pts) as a proxy for functional improvement  Evaluation/Baseline: 23/50 pts Goal status: INITIAL   3.  Meshia will be able to lift 25# to simulate lifting a child at work, not limited by pain  Evaluation/Baseline: limited Goal status: INITIAL   4.  Zawadi will improve the following MMTs to >/= 4/5 to show improvement in strength:  mid and lower trap (L)   Evaluation/Baseline: see chart in note Goal status: INITIAL    PLAN: PT FREQUENCY: 1-2x/week  PT DURATION: 8 weeks  PLANNED INTERVENTIONS:  97164- PT Re-evaluation, 97110-Therapeutic exercises, 97530- Therapeutic activity, [id number]- Neuromuscular re-education, 97535- Self Care, 02859- Manual therapy, [id number]- Gait training, [id number]- Aquatic Therapy, [phone]- Electrical stimulation (manual), [id number]- Vasopneumatic device, [id number]- Traction (mechanical), [id number]- Ionotophoresis 4mg /ml Dexamethasone , Taping, Dry Needling, Joint manipulation, and Spinal manipulation.   Graydon Dingwall, PT, ATRIC Certified Exercise Expert for the Aging Adult  06/11/24 4:05 PM Phone: [phone] Fax: [phone]

## 2024-06-11 ENCOUNTER — Ambulatory Visit: Attending: Family Medicine | Admitting: Physical Therapy

## 2024-06-11 ENCOUNTER — Encounter: Payer: Self-pay | Admitting: Physical Therapy

## 2024-06-11 DIAGNOSIS — M6281 Muscle weakness (generalized): Secondary | ICD-10-CM | POA: Insufficient documentation

## 2024-06-11 DIAGNOSIS — M546 Pain in thoracic spine: Secondary | ICD-10-CM | POA: Diagnosis present

## 2024-06-15 ENCOUNTER — Encounter: Admitting: Physical Therapy

## 2024-06-23 ENCOUNTER — Ambulatory Visit: Admitting: Physical Therapy

## 2024-06-23 ENCOUNTER — Encounter: Payer: Self-pay | Admitting: Physical Therapy

## 2024-06-23 DIAGNOSIS — M546 Pain in thoracic spine: Secondary | ICD-10-CM | POA: Diagnosis not present

## 2024-06-23 DIAGNOSIS — M6281 Muscle weakness (generalized): Secondary | ICD-10-CM

## 2024-06-23 NOTE — Therapy (Signed)
 OUTPATIENT PHYSICAL THERAPY SHOULDER DAILY NOTE   Patient Name: Stephanie Serrano MRN: [id number] DOB:[birth year], 58 y.o., female Today's Date: 06/23/2024   PT End of Session - 06/23/24 1503     Visit Number 7    Date for PT Re-Evaluation 07/07/24    Authorization Type UHC MCR - NDI    PT Start Time 1503    PT Stop Time 1545    PT Time Calculation (min) 42 min    Activity Tolerance Patient tolerated treatment well;Patient limited by pain    Behavior During Therapy WFL for tasks assessed/performed            Past Medical History:  Diagnosis Date   Allergy    Anemia    Anxiety    Arthritis    Asthma    Blood transfusion without reported diagnosis    Depression    Eczema    Esophageal stricture    GERD (gastroesophageal reflux disease)    Migraines    OCD (obsessive compulsive disorder)    RLS (restless legs syndrome)    Vertigo    Past Surgical History:  Procedure Laterality Date   ESOPHAGOGASTRODUODENOSCOPY N/A 02/09/2015   Procedure: ESOPHAGOGASTRODUODENOSCOPY (EGD);  Surgeon: Lamar JONETTA Aho, MD;  Location: [facility];  Service: Endoscopy;  Laterality: N/A;  with dilation   FOOT SURGERY     bone spurs   NASAL SINUS SURGERY     PLANTAR FASCIA RELEASE Right 08/03/2016   Procedure: PLANTAR FASCIA RELEASE WITH BONE SPUR EXCISION;  Surgeon: Norleen Gavel, MD;  Location: [facility];  Service: Orthopedics;  Laterality: Right;   TONSILLECTOMY     Patient Active Problem List   Diagnosis Date Noted   Chest pain of uncertain etiology 10/14/2019   Heart palpitations 09/05/2019   Shortness of breath 09/05/2019   Anxiety 09/05/2019   Tachycardia 07/23/2019   Sprain of right ankle 02/03/2019   MDD (major depressive disorder), recurrent episode, moderate (HCC) 06/28/2017   OCD (obsessive compulsive disorder) 03/26/2017   Obstructive sleep apnea of adult 01/18/2017   Insomnia 02/08/2016   Heel spur 09/01/2015   Family history of cystitis 08/31/2015   Left  foot pain 08/31/2015   Prediabetes 08/23/2015   Hyperlipidemia 08/23/2015   Depression 03/21/2015   Acute esophagitis 02/09/2015   Gastroesophageal reflux disease without esophagitis 08/27/2014   Dysphagia 03/22/2014    PCP: Chrystal Lamarr RAMAN, MD  REFERRING PROVIDER: Irving Delinda ORN, MD  THERAPY DIAG:  Pain in thoracic spine  Muscle weakness  REFERRING DIAG: Radiculitis of left cervical region [M54.12]   Rationale for Evaluation and Treatment:  Rehabilitation  SUBJECTIVE:  PERTINENT PAST HISTORY:  Dysphagia, depression, prediabetes, OCD, depression, anxiety        PRECAUTIONS: None  WEIGHT BEARING RESTRICTIONS No  FALLS:  Has patient fallen in last 6 months? No, Number of falls: 0  MOI/History of condition:  Onset date: May 10th  SUBJECTIVE STATEMENT  06/23/2024:  I did like the TPDN but I have been taking the muscle relaxer this weekend. I had an incident walking to my car with my daughter and I had seering pain in my left side like my arm was being jerked out of my body when I turned my neck.  Now I feel like 4/10 pain and it tends to get aggravated if I am stretching and lifting at the same time. Since I started HRT I have started having inner ear problems.   EVAL- Stephanie Serrano is a 58 y.o. female who presents  to clinic with chief complaint of L medial periscapular pain.  This started in early May.  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  The pain goes away completely if she rests.  She denies any n/t or pain into the L UE.  She did not have any specific trauma though she does report that she tried on bras shortly before the pain started which may have contributed to her sxs.   Red flags:  denies   Pain:  Are you having pain? 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  Assistive Device: na  Hand Dominance: R  Patient Goals/Specific Activities: pain, lifting kids at work   OBJECTIVE:   DIAGNOSTIC FINDINGS:  Depression, OCD, anxiety  GENERAL OBSERVATION: Rounded shoulder     SENSATION: Light touch: Appears intact   PALPATION: TTP medial periscapular musculature mid trap/rhomboid   Cervical ROM  ROM ROM  (Eval)  Flexion 40 pulling  Extension 30*  Right lateral flexion 40  Left lateral flexion 40  Right rotation 60  Left rotation 60  Flexion rotation (normal is 30 degrees)   Flexion rotation (normal is 30 degrees)     (Blank rows = not tested, N = WNL, * = concordant pain)  UPPER EXTREMITY MMT:  MMT Right (Eval) Left (Eval)  R/L 06-23-24  Shoulder flexion     Shoulder abduction (C5)     Shoulder ER 4+ 4*   Shoulder IR     Middle trapezius 4 3+*   Lower trapezius 4 3+*   Shoulder extension 4+ 4+*   Grip strength   Right 45.6 lb Left 50.3 lb  Shoulder shrug (C4)     Elbow flexion (C6)     Elbow ext (C7)     Thumb ext (C8)     Finger abd (T1)     Grossly      (Blank rows = not tested, score listed is out of 5 possible points.  N = WNL, D = diminished, C = clear for gross weakness with myotome testing, * = concordant pain with testing)   SPECIAL TESTS:  Spurlings (-)   PATIENT SURVEYS:  NDI: 23/50 06-23-24  NDI 16/50 32%    TODAY'S TREATMENT:  [facility] Adult PT Treatment:                                                DATE: 06-23-24 Therapeutic Exercise: UE/LE Arm bike with VC and TC taking subjective 6 min  3 min forward, 3 min backward Green P ball with UE T and Y  3 x 10 with  Seated Star pattern with RTB 3 x 10 Shoulder ER GTB 2 x 15 Child's Pose forward to lateral r and L Manual Therapy: Use of double tennis balls for self mobs of thoracic spine OH Shoulder distraction, Bilaterally  Cervical  distraction Thoracic PA mobs  [facility] Adult PT Treatment:  DATE: 06-11-24 Therapeutic Exercise: UE/LE Arm bike with VC and TC taking subjective Seated Star pattern with RTB 3 x 10 Shoulder ER GTB 2 x 15 Seated OH flexion and press with RTB 3 x 10 Y and T and W with 1 # DB x 12 Y and T with 2 # DB x 12 Childs pose with thread the needle  Seated UT and LS stretches bilaterally  Manual Therapy: OH Shoulder distraction, Bilaterally  Cervical distraction Thoracic PA mobs  [facility] Adult PT Treatment:                                                DATE: 06/08/24 Therapeutic Exercise: Supine star pattern , GTB 10 x 2  Supine horiz abdct 10 x 2  Supine shoulder ER GTB 2 x 15 DNF 5 sec x 8 Supine bilateral Protraction x 10 Open books- dizziness so disc.  Prone scap retract x 10 Prone Bent T x 10 Qped Alt UE lifts with chin tuck  Childs pose with thread the needle  Seated UT and LS stretches bilaterally  Manual Therapy: OH Shoulder distraction, Bilaterally   Self Care: TPR with theracane    [facility] Adult PT Treatment:                                                DATE: 06/03/24 Therapeutic Exercise: Foam roller routine (1/2 foam roller) for thoracic mobility - including protraction/retraction (unilateral and bilateral), cc/cw circles, horizontal abduction, shoulder flexion/ext alternating Supine star pattern x 10 each - on 1/2 foam roller Supine retraction into table 2x10  Supine shoulder ER RTB 10 x 3 on 1/2 foam roller Quadriped with thread the needle Quadriped cat camel Quadriped Alt UE with chin tuck  Discussion regarding capacity and exercise tolerance  Manual Therapy  STM L periscapular musculature  [facility] Adult PT Treatment:                                                DATE: 06/01/24 Therapeutic Exercise: UBE 2 min each way L1  Open books x 8 each  Supine horiz abdct x 10  Supine star pattern x 10 each  DNF 5 sec x 10 Supine pro  traction  x 10  Supine shoulder ER RTB 10 x 3  Quadriped with thread the needle Quadriped Alt UE with chin tuck  Seated in chair thoracic extension with foam roller vs wall for gym  LS and upper trap stretch  Self care- using theracane for self TPR          PATIENT EDUCATION (Welcome/HM):  POC, diagnosis, prognosis, HEP, and outcome measures.  Pt educated via explanation, demonstration, and handout (HEP).  Pt confirms understanding verbally.    HOME EXERCISE PROGRAM: Access Code: [email or web address]  updated URL: [email or web address] Date: 05/25/2024 Prepared by: Graydon Dingwall  Program Notes Add 1)Quadriped with 1)thread the needle 2) thoracic stretch elbow to ceilingAdd Whip rotation with elbow elevated hands on head  Exercises - Shoulder Rolls in Sitting  - 1 x daily - 7 x weekly - 3 sets - 10 reps -  Sidelying Thoracic Rotation with Open Book  - 1 x daily - 7 x weekly - 1 sets - 20 reps - 2 hold - Seated Levator Scapulae Stretch  - 1 x daily - 7 x weekly - 3 reps - 45 second hold - Supine Cervical Retraction with Towel  - 1 x daily - 7 x weekly - 3 sets - 10 reps - Standing Shoulder Diagonal Horizontal Abduction 60/120 Degrees with Resistance  - 1 x daily - 7 x weekly - 3 sets - 10 reps - Standing Shoulder External Rotation with Resistance  - 1 x daily - 7 x weekly - 3 sets - 10 reps - Shoulder External Rotation and Scapular Retraction with Resistance  - 1 x daily - 7 x weekly - 3 sets - 10 reps  Treatment priorities   Eval 06-23-24       Manual and TPR mid trap/rhomboid  Decreased muscle tension post manual       Progressive periscapular stretching I with Initial HEP       NDI NDI 16/50 32%                         ASSESSMENT:  CLINICAL IMPRESSION: Stephanie Serrano tolerated session well with no adverse reaction. Pt with recent exacerbation this past weekend.  Pt grip strength WNL with non dominant hand 5 lb stronger than dominant. Pt able to perform all exercises without  exacerbation of pain. Performed strengthening in seated  and prone.  Pt 4/10 today and decreased to 1/10 at end of session with decreased muscle tension.  She was challenged with maintaining protracted scapula in closed chain when performing UE raises. Pt given double tennis ball for self mobilization of thoracic musculature.NDI 16/50 32%  Will continue to progress as tolerated.   EVAL -Stephanie Serrano is a 58 y.o. female who presents to clinic with signs and sxs consistent with L sided medial periscapular pain.  Consistent with myofacial origin with point tenderness and no reproduction with cervical movements.  She will benefit from skilled PT to address relevant deficits and improve comfort with daily tasks including work, lifting, and driving.   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OH, driving  PERSONAL FACTORS: See medical history and pertinent history   REHAB POTENTIAL: Good  CLINICAL DECISION MAKING: Evolving/moderate complexity  EVALUATION COMPLEXITY: Moderate   GOALS:   SHORT TERM GOALS: Target date: 06/10/2024  Stephanie Serrano will be >75% HEP compliant to improve carryover between sessions and facilitate independent management of condition  Evaluation: ongoing 06-23-24 Pt 75%  HEP Goal status:MET   LONG TERM GOALS: Target date: 07/08/2024   Stephanie Serrano will self report >/= 50% decrease in pain from evaluation to improve function in daily tasks  Evaluation/Baseline: 8/10 max pain 06-23-24 4/10 today but with recent exacerbation Goal status: Progressing   2.  Stephanie Serrano will show a >/= 10 pt improvement in their NDI score (MCID ~20% or 10/50 pts) as a proxy for functional improvement  Evaluation/Baseline: 23/50 pts 06-23-24 NDI 16/50 32% Goal status: Progressing   3.  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   Stephanie Serrano will improve the following MMTs to >/= 4/5 to show improvement in strength:  mid and lower trap (L)   Evaluation/Baseline: see chart in note Goal status:ONGOING    PLAN: PT FREQUENCY: 1-2x/week  PT  DURATION: 8 weeks  PLANNED INTERVENTIONS:  97164- PT Re-evaluation, 97110-Therapeutic exercises, 97530- Therapeutic activity, [id number]- Neuromuscular re-education, 97535- Self Care, 02859- Manual therapy, [id number]- Gait training, [phone]- Aquatic Therapy, [phone]- Electrical stimulation (manual), [id number]- Vasopneumatic device, [id number]- Traction (mechanical), [id number]- Ionotophoresis 4mg /ml Dexamethasone , Taping, Dry Needling, Joint manipulation, and Spinal manipulation.   Graydon Dingwall, PT, ATRIC Certified Exercise Expert for the Aging Adult  06/23/24 4:53 PM Phone: [phone] Fax: [phone]

## 2024-06-24 NOTE — Therapy (Addendum)
 OUTPATIENT PHYSICAL THERAPY SHOULDER DAILY NOTE  PHYSICAL THERAPY DISCHARGE SUMMARY  Visits from Start of Care: 8  Current functional level related to goals / functional outcomes: See goals   Remaining deficits: Current status unknown due to pt not returning   Education / Equipment: HEP, Theraband, posture   Patient agrees to discharge. Patient goals were not met. Patient is being discharged due to not returning since the last visit. Joneen Fresh PT, DPT, LAT, ATC  08/25/24  8:39 AM        Patient Name: Presli Fanguy MRN: [id number] DOB:22-Apr-1966, 58 y.o., female Today's Date: 06/25/2024   PT End of Session - 06/25/24 1336     Visit Number 8    Date for PT Re-Evaluation 07/07/24    Authorization Type [employer] MCR - NDI    PT Start Time 1333    PT Stop Time 1415    PT Time Calculation (min) 42 min    Activity Tolerance Patient tolerated treatment well;Patient limited by pain    Behavior During Therapy WFL for tasks assessed/performed             Past Medical History:  Diagnosis Date   Allergy    Anemia    Anxiety    Arthritis    Asthma    Blood transfusion without reported diagnosis    Depression    Eczema    Esophageal stricture    GERD (gastroesophageal reflux disease)    Migraines    OCD (obsessive compulsive disorder)    RLS (restless legs syndrome)    Vertigo    Past Surgical History:  Procedure Laterality Date   ESOPHAGOGASTRODUODENOSCOPY N/A 02/09/2015   Procedure: ESOPHAGOGASTRODUODENOSCOPY (EGD);  Surgeon: Lamar JONETTA Aho, MD;  Location: [facility];  Service: Endoscopy;  Laterality: N/A;  with dilation   FOOT SURGERY     bone spurs   NASAL SINUS SURGERY     PLANTAR FASCIA RELEASE Right 08/03/2016   Procedure: PLANTAR FASCIA RELEASE WITH BONE SPUR EXCISION;  Surgeon: Norleen Gavel, MD;  Location: [facility];  Service: Orthopedics;  Laterality: Right;   TONSILLECTOMY     Patient Active Problem List   Diagnosis Date Noted    Chest pain of uncertain etiology 10/14/2019   Heart palpitations 09/05/2019   Shortness of breath 09/05/2019   Anxiety 09/05/2019   Tachycardia 07/23/2019   Sprain of right ankle 02/03/2019   MDD (major depressive disorder), recurrent episode, moderate (HCC) 06/28/2017   OCD (obsessive compulsive disorder) 03/26/2017   Obstructive sleep apnea of adult 01/18/2017   Insomnia 02/08/2016   Heel spur 09/01/2015   Family history of cystitis 08/31/2015   Left foot pain 08/31/2015   Prediabetes 08/23/2015   Hyperlipidemia 08/23/2015   Depression 03/21/2015   Acute esophagitis 02/09/2015   Gastroesophageal reflux disease without esophagitis 08/27/2014   Dysphagia 03/22/2014    PCP: Chrystal Lamarr RAMAN, MD  REFERRING PROVIDER: Chrystal Lamarr RAMAN, *  THERAPY DIAG:  Pain in thoracic spine  Muscle weakness  REFERRING DIAG: Radiculitis of left cervical region [M54.12]   Rationale for Evaluation and Treatment:  Rehabilitation  SUBJECTIVE:  PERTINENT PAST HISTORY:  Dysphagia, depression, prediabetes, OCD, depression, anxiety        PRECAUTIONS: None  WEIGHT BEARING RESTRICTIONS No  FALLS:  Has patient fallen in last 6 months? No, Number of falls: 0  MOI/History of condition:  Onset date: May 10th  SUBJECTIVE STATEMENT  06/25/2024: I am declining  TPDN today  I am still dealing with dizziness.  No new incidents but I am fearful of having dizziness. Now I feel like 3/10 pain and it tends to get aggravated if I am stretching and lifting at the same time.   EVAL- Maghan Jessee is a 58 y.o. female who presents to clinic with chief complaint of L medial periscapular pain.  This started in early May.  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  The pain goes away completely if she rests.  She denies any n/t or pain into the L UE.  She did not have any specific trauma though she does report that she tried on bras shortly before the  pain started which may have contributed to her sxs.   Red flags:  denies   Pain:  Are you having pain? 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   OBJECTIVE:   DIAGNOSTIC FINDINGS:  Depression, OCD, anxiety  GENERAL OBSERVATION: Rounded shoulder     SENSATION: Light touch: Appears intact   PALPATION: TTP medial periscapular musculature mid trap/rhomboid   Cervical ROM  ROM ROM  (Eval)  Flexion 40 pulling  Extension 30*  Right lateral flexion 40  Left lateral flexion 40  Right rotation 60  Left rotation 60  Flexion rotation (normal is 30 degrees)   Flexion rotation (normal is 30 degrees)     (Blank rows = not tested, N = WNL, * = concordant pain)  UPPER EXTREMITY MMT:  MMT Right (Eval) Left (Eval)  R/L 06-23-24  Shoulder flexion     Shoulder abduction (C5)     Shoulder ER 4+ 4*   Shoulder IR     Middle trapezius 4 3+*   Lower trapezius 4 3+*   Shoulder extension 4+ 4+*   Grip strength   Right 45.6 lb Left 50.3 lb  Shoulder shrug (C4)     Elbow flexion (C6)     Elbow ext (C7)     Thumb ext (C8)     Finger abd (T1)     Grossly      (Blank rows = not tested, score listed is out of 5 possible points.  N = WNL, D = diminished, C = clear for gross weakness with myotome testing, * = concordant pain with testing)   SPECIAL TESTS:  Spurlings (-)   PATIENT SURVEYS:  NDI: 23/50 06-23-24  NDI 16/50 32%    TODAY'S TREATMENT:   [facility] Adult PT Treatment:                                                DATE: 06-25-24 Therapeutic Exercise: Thoracic extension mobs with pink foam roller.  Supine on pink foam roller with  thoracic extension and Y Supine horizontal abduction with GTB Shoulder ER GTB 2 x 15 Pt with dizziness and ended with standing I and Y with 1 # weights 3 x 10 Manual Therapy Prone Left UPA Lateral mobs bil to cervical region Cervical distraction Thoracic PA mobs Suboccipital release  [facility] Adult PT Treatment:  DATE: 06-23-24 Therapeutic Exercise: UE/LE Arm bike with VC and TC taking subjective 6 min  3 min forward, 3 min backward Green P ball with UE T and Y  3 x 10 with  Seated Star pattern with RTB 3 x 10 Shoulder ER GTB 2 x 15 Child's Pose forward to lateral r and L Manual Therapy: Use of double tennis balls for self mobs of thoracic spine OH Shoulder distraction, Bilaterally  Cervical distraction Thoracic PA mobs  [facility] Adult PT Treatment:                                                DATE: 06-11-24 Therapeutic Exercise: UE/LE Arm bike with VC and TC taking subjective Seated Star pattern with RTB 3 x 10 Shoulder ER GTB 2 x 15 Seated OH flexion and press with RTB 3 x 10 Y and T and W with 1 # DB x 12 Y and T with 2 # DB x 12 Childs pose with thread the needle  Seated UT and LS stretches bilaterally  Manual Therapy: OH Shoulder distraction, Bilaterally  Cervical distraction Thoracic PA mobs  [facility] Adult PT Treatment:                                                DATE: 06/08/24 Therapeutic Exercise: Supine star pattern , GTB 10 x 2  Supine horiz abdct 10 x 2  Supine shoulder ER GTB 2 x 15 DNF 5 sec x 8 Supine bilateral Protraction x 10 Open books- dizziness so disc.  Prone scap retract x 10 Prone Bent T x 10 Qped Alt UE lifts with chin tuck  Childs pose with thread the needle  Seated UT and LS stretches bilaterally  Manual Therapy: OH Shoulder distraction, Bilaterally   Self Care: TPR with theracane    [facility] Adult PT Treatment:                                                DATE: 06/03/24 Therapeutic Exercise: Foam  roller routine (1/2 foam roller) for thoracic mobility - including protraction/retraction (unilateral and bilateral), cc/cw circles, horizontal abduction, shoulder flexion/ext alternating Supine star pattern x 10 each - on 1/2 foam roller Supine retraction into table 2x10  Supine shoulder ER RTB 10 x 3 on 1/2 foam roller Quadriped with thread the needle Quadriped cat camel Quadriped Alt UE with chin tuck  Discussion regarding capacity and exercise tolerance  Manual Therapy  STM L periscapular musculature  [facility] Adult PT Treatment:                                                DATE: 06/01/24 Therapeutic Exercise: UBE 2 min each way L1  Open books x 8 each  Supine horiz abdct x 10  Supine star pattern x 10 each  DNF 5 sec x 10 Supine pro traction  x 10  Supine shoulder ER RTB 10 x 3  Quadriped with thread the  needle Quadriped Alt UE with chin tuck  Seated in chair thoracic extension with foam roller vs wall for gym  LS and upper trap stretch  Self care- using theracane for self TPR          PATIENT EDUCATION ([place]/[facility]):  POC, diagnosis, prognosis, HEP, and outcome measures.  Pt educated via explanation, demonstration, and handout (HEP).  Pt confirms understanding verbally.    HOME EXERCISE PROGRAM: Access Code: [email or web address]  updated URL: [email or web address] Date: 05/25/2024 Prepared by: Graydon Dingwall  Program Notes Add 1)Quadriped with 1)thread the needle 2) thoracic stretch elbow to ceilingAdd Whip rotation with elbow elevated hands on head  Exercises - Shoulder Rolls in Sitting  - 1 x daily - 7 x weekly - 3 sets - 10 reps - Sidelying Thoracic Rotation with Open Book  - 1 x daily - 7 x weekly - 1 sets - 20 reps - 2 hold - Seated Levator Scapulae Stretch  - 1 x daily - 7 x weekly - 3 reps - 45 second hold - Supine Cervical Retraction with Towel  - 1 x daily - 7 x weekly - 3 sets - 10 reps - Standing Shoulder Diagonal Horizontal Abduction 60/120 Degrees with  Resistance  - 1 x daily - 7 x weekly - 3 sets - 10 reps - Standing Shoulder External Rotation with Resistance  - 1 x daily - 7 x weekly - 3 sets - 10 reps - Shoulder External Rotation and Scapular Retraction with Resistance  - 1 x daily - 7 x weekly - 3 sets - 10 reps Added 06-25-24 - Thoracic Mobilization on Foam Roll - Hands Clasped  - 1 x daily - 7 x weekly - 1 sets - 10 reps - Thoracic Y on Foam Roll  - 1 x daily - 7 x weekly - 1 sets - 10 reps Treatment priorities   Eval 06-23-24       Manual and TPR mid trap/rhomboid  Decreased muscle tension post manual       Progressive periscapular stretching I with Initial HEP       NDI NDI 16/50 32%                         ASSESSMENT:  CLINICAL IMPRESSION: Tekla  enters clinic with 3/10 and complains of  dizziness off and on throughout the day. Session started with manual to cervical and head and ended with adding to HEP for thoracic extension and working on coordination with foam roller.  She tolerated session well but did have some increased dizziness. Pt ended session with more upright activities and got water to hydrate. Pt left session with decreased dizziness and updated HEP  EVAL -Cilicia is a 58 y.o. female who presents to clinic with signs and sxs consistent with L sided medial periscapular pain.  Consistent with myofacial origin with point tenderness and no reproduction with cervical movements.  She will benefit from skilled PT to address relevant deficits and improve comfort with daily tasks including work, lifting, and driving.   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OH, driving  PERSONAL FACTORS: See medical history and pertinent history   REHAB POTENTIAL: Good  CLINICAL DECISION MAKING: Evolving/moderate complexity  EVALUATION COMPLEXITY: Moderate   GOALS:   SHORT TERM GOALS: Target date: 06/10/2024  Aqua will be >75% HEP compliant to improve  carryover between sessions and facilitate independent management of condition  Evaluation: ongoing 06-23-24 Pt  75%  HEP Goal status:MET   LONG TERM GOALS: Target date: 07/08/2024   Rahaf will self report >/= 50% decrease in pain from evaluation to improve function in daily tasks  Evaluation/Baseline: 8/10 max pain 06-23-24 4/10 today but with recent exacerbation Goal status: Progressing   2.  Cheree will show a >/= 10 pt improvement in their NDI score (MCID ~20% or 10/50 pts) as a proxy for functional improvement  Evaluation/Baseline: 23/50 pts 06-23-24 NDI 16/50 32% Goal status: Progressing   3.  Minh will be able to lift 25# to simulate lifting a child at work, not limited by pain  Evaluation/Baseline: limited Goal status:ONGOING   4.  Lema will improve the following MMTs to >/= 4/5 to show improvement in strength:  mid and lower trap (L)   Evaluation/Baseline: see chart in note Goal status:ONGOING    PLAN: PT FREQUENCY: 1-2x/week  PT DURATION: 8 weeks  PLANNED INTERVENTIONS:  [id number]- PT Re-evaluation, 97110-Therapeutic exercises, 97530- Therapeutic activity, [id number]- Neuromuscular re-education, 97535- Self Care, 02859- Manual therapy, [id number]- Gait training, [id number]- Aquatic Therapy, [phone]- Electrical stimulation (manual), [id number]- Vasopneumatic device, [id number]- Traction (mechanical), [id number]- Ionotophoresis 4mg /ml Dexamethasone , Taping, Dry Needling, Joint manipulation, and Spinal manipulation.   Graydon Dingwall, PT, ATRIC Certified Exercise Expert for the Aging Adult  06/25/24 2:17 PM Phone: [phone] Fax: [phone]

## 2024-06-25 ENCOUNTER — Encounter: Payer: Self-pay | Admitting: Physical Therapy

## 2024-06-25 ENCOUNTER — Ambulatory Visit: Admitting: Physical Therapy

## 2024-06-25 DIAGNOSIS — M6281 Muscle weakness (generalized): Secondary | ICD-10-CM

## 2024-06-25 DIAGNOSIS — M546 Pain in thoracic spine: Secondary | ICD-10-CM

## 2024-06-30 ENCOUNTER — Ambulatory Visit: Admitting: Physical Therapy

## 2024-07-02 ENCOUNTER — Ambulatory Visit: Admitting: Physical Therapy

## 2024-07-06 ENCOUNTER — Ambulatory Visit: Admitting: Physical Therapy

## 2024-10-14 ENCOUNTER — Encounter: Payer: Self-pay | Admitting: Podiatry

## 2024-10-14 ENCOUNTER — Ambulatory Visit (INDEPENDENT_AMBULATORY_CARE_PROVIDER_SITE_OTHER)

## 2024-10-14 ENCOUNTER — Ambulatory Visit (INDEPENDENT_AMBULATORY_CARE_PROVIDER_SITE_OTHER): Admitting: Podiatry

## 2024-10-14 VITALS — Ht 62.0 in | Wt 141.0 lb

## 2024-10-14 DIAGNOSIS — M7752 Other enthesopathy of left foot: Secondary | ICD-10-CM

## 2024-10-14 NOTE — Progress Notes (Signed)
 Chief Complaint  Patient presents with   Toe Pain    Pt is here due to left 2nd toe pain, states she fracture the toe years ago, states the toe is longer then the others and hurt while wearing certain shoes.    HPI: 58 y.o. female presenting today as a reestablish new patient for evaluation of left second toe pain.  Past Medical History:  Diagnosis Date   Allergy    Anemia    Anxiety    Arthritis    Asthma    Blood transfusion without reported diagnosis    Depression    Eczema    Esophageal stricture    GERD (gastroesophageal reflux disease)    Migraines    OCD (obsessive compulsive disorder)    RLS (restless legs syndrome)    Vertigo     Past Surgical History:  Procedure Laterality Date   ESOPHAGOGASTRODUODENOSCOPY N/A 02/09/2015   Procedure: ESOPHAGOGASTRODUODENOSCOPY (EGD);  Surgeon: Lamar JONETTA Aho, MD;  Location: [facility];  Service: Endoscopy;  Laterality: N/A;  with dilation   FOOT SURGERY     bone spurs   NASAL SINUS SURGERY     PLANTAR FASCIA RELEASE Right 08/03/2016   Procedure: PLANTAR FASCIA RELEASE WITH BONE SPUR EXCISION;  Surgeon: Norleen Gavel, MD;  Location: [facility];  Service: Orthopedics;  Laterality: Right;   TONSILLECTOMY      Allergies  Allergen Reactions   Diltiazem  Shortness Of Breath   Azithromycin    Clindamycin/Lincomycin    Doxycycline      Swelling of mucous membrane, body pain   Penicillins     Has patient had a PCN reaction causing immediate rash, facial/tongue/throat swelling, SOB or lightheadedness with hypotension: yes Has patient had a PCN reaction causing severe rash involving mucus membranes or skin necrosis: no Has patient had a PCN reaction that required hospitalization: no Has patient had a PCN reaction occurring within the last 10 years: no If all of the above answers are NO, then may proceed with Cephalosporin use.    Propranolol      Increase palpitations   Sulfa Antibiotics    Sulfasalazine Other  (See Comments)   Vicodin [Hydrocodone -Acetaminophen ] Nausea Only   Codeine Anxiety, Itching and Nausea And Vomiting     Physical Exam: General: The patient is alert and oriented x3 in no acute distress.  Dermatology: Skin is warm, dry and supple bilateral lower extremities.   Vascular: Palpable pedal pulses bilaterally. Capillary refill within normal limits.  No appreciable edema.  No erythema.  Neurological: Grossly intact via light touch  Musculoskeletal Exam: Moderate hammertoe deformity noted to the second digit left foot with associated tenderness to palpation range of motion second TP  Radiographic Exam LT foot 10/14/2024:  Normal osseous mineralization. Joint spaces preserved.  K wire fixation noted to the distal aspect of the first metatarsal as 2 orthopedic screws noted to the distal aspect of the fifth metatarsal from prior bunion and tailor's bunion surgery.  No complicating features noted.  Assessment/Plan of Care: 1.  Second MTP capsulitis left  -Patient evaluated.  X-rays reviewed -No prescribed anti-inflammatories nor give any steroid injections due to the patient being on chronic injections of Dupixent   -Advised against going barefoot.  Recommend good supportive tennis shoes and sneakers -Continue arch supports to offload pressure from the forefoot.  I do believe that custom molded orthotics to support the medial longitudinal arch to the foot and offload pressure from the forefoot should potentially help alleviate the patient's symptoms.  Appointment today with orthotics department for custom molded insole fitting -Refrain from going barefoot -Return to clinic with me PRN   Thresa EMERSON Sar, DPM [facility]  Dr. Thresa EMERSON Sar, DPM    [address] [place], [place] [address]                Office [phone]  Fax [phone]

## 2024-11-02 ENCOUNTER — Other Ambulatory Visit

## 2024-11-11 ENCOUNTER — Ambulatory Visit: Admitting: Podiatrist

## 2024-11-11 DIAGNOSIS — M7752 Other enthesopathy of left foot: Secondary | ICD-10-CM

## 2024-11-11 NOTE — Progress Notes (Addendum)
 Stephanie Serrano present today for an orthotic scan.  She is seeing Dr. Janit for capsulitis of the MPJ- 2nd met left foot.  Relates the second toe is also painful  Patient presented for evaluation/ scan for custom molded foot orthotics.  Patient will benefit from custom foot orthotics to provide total contact to bilateral medial longitudinal arches to help balance and distribute body weight more evenly.  Thus reducing plantar pressure and pain.   Orthotic will encourage forefoot and rearfoot alignment.    Patient was scanned today with OHI scanner.    Orthotics are ordered.  Signature obtained for notification of pricing/ fees for the device.  When the orthotic is ready for pick up, will call to make an appointment for a fitting.

## 2024-11-23 ENCOUNTER — Telehealth: Payer: Self-pay

## 2024-11-23 NOTE — Telephone Encounter (Signed)
Patients orthotics are in .  

## 2024-12-09 NOTE — Telephone Encounter (Signed)
 12/09/24 LVM letting pt know orthotics are in and to call back to schedule an appt

## 2024-12-24 ENCOUNTER — Ambulatory Visit (INDEPENDENT_AMBULATORY_CARE_PROVIDER_SITE_OTHER): Admitting: Podiatrist

## 2024-12-24 DIAGNOSIS — M19072 Primary osteoarthritis, left ankle and foot: Secondary | ICD-10-CM

## 2024-12-24 DIAGNOSIS — M7752 Other enthesopathy of left foot: Secondary | ICD-10-CM | POA: Diagnosis not present

## 2024-12-24 DIAGNOSIS — M7751 Other enthesopathy of right foot: Secondary | ICD-10-CM | POA: Diagnosis not present

## 2024-12-24 NOTE — Progress Notes (Signed)
